# Patient Record
Sex: Male | Born: 1960
Health system: Southern US, Community
[De-identification: ages and names within clinical notes are randomized; demographics above are authoritative.]

## PROBLEM LIST (undated history)

## (undated) DIAGNOSIS — F419 Anxiety disorder, unspecified: Secondary | ICD-10-CM

## (undated) DIAGNOSIS — E7800 Pure hypercholesterolemia, unspecified: Secondary | ICD-10-CM

## (undated) DIAGNOSIS — C801 Malignant (primary) neoplasm, unspecified: Secondary | ICD-10-CM

## (undated) DIAGNOSIS — N200 Calculus of kidney: Secondary | ICD-10-CM

---

## 2015-03-15 ENCOUNTER — Emergency Department (HOSPITAL_COMMUNITY)
Admission: EM | Admit: 2015-03-15 | Discharge: 2015-03-15 | Disposition: A | Discharge status: Home / Self Care | Payer: BLUE CROSS/BLUE SHIELD | Attending: Emergency Medicine | Admitting: Emergency Medicine

## 2015-03-15 ENCOUNTER — Emergency Department (HOSPITAL_COMMUNITY): Payer: BLUE CROSS/BLUE SHIELD

## 2015-03-15 ENCOUNTER — Encounter (HOSPITAL_COMMUNITY): Payer: Self-pay | Admitting: Emergency Medicine

## 2015-03-15 DIAGNOSIS — Z8639 Personal history of other endocrine, nutritional and metabolic disease: Secondary | ICD-10-CM | POA: Diagnosis not present

## 2015-03-15 DIAGNOSIS — R109 Unspecified abdominal pain: Secondary | ICD-10-CM

## 2015-03-15 DIAGNOSIS — R739 Hyperglycemia, unspecified: Secondary | ICD-10-CM | POA: Diagnosis not present

## 2015-03-15 DIAGNOSIS — R911 Solitary pulmonary nodule: Secondary | ICD-10-CM | POA: Diagnosis not present

## 2015-03-15 DIAGNOSIS — N200 Calculus of kidney: Secondary | ICD-10-CM | POA: Diagnosis not present

## 2015-03-15 DIAGNOSIS — K5791 Diverticulosis of intestine, part unspecified, without perforation or abscess with bleeding: Secondary | ICD-10-CM | POA: Insufficient documentation

## 2015-03-15 DIAGNOSIS — R112 Nausea with vomiting, unspecified: Secondary | ICD-10-CM

## 2015-03-15 DIAGNOSIS — K5790 Diverticulosis of intestine, part unspecified, without perforation or abscess without bleeding: Secondary | ICD-10-CM

## 2015-03-15 HISTORY — DX: Pure hypercholesterolemia, unspecified: E78.00

## 2015-03-15 HISTORY — DX: Calculus of kidney: N20.0

## 2015-03-15 LAB — CBC WITH DIFFERENTIAL/PLATELET
BASOS PCT: 1 % (ref 0–1)
Basophils Absolute: 0 10*3/uL (ref 0.0–0.1)
EOS ABS: 0.1 10*3/uL (ref 0.0–0.7)
Eosinophils Relative: 2 % (ref 0–5)
HEMATOCRIT: 41.6 % (ref 39.0–52.0)
Hemoglobin: 13.9 g/dL (ref 13.0–17.0)
LYMPHS ABS: 1.3 10*3/uL (ref 0.7–4.0)
Lymphocytes Relative: 23 % (ref 12–46)
MCH: 28.7 pg (ref 26.0–34.0)
MCHC: 33.4 g/dL (ref 30.0–36.0)
MCV: 85.8 fL (ref 78.0–100.0)
MONO ABS: 0.6 10*3/uL (ref 0.1–1.0)
MONOS PCT: 11 % (ref 3–12)
NEUTROS ABS: 3.6 10*3/uL (ref 1.7–7.7)
Neutrophils Relative %: 63 % (ref 43–77)
Platelets: 223 10*3/uL (ref 150–400)
RBC: 4.85 MIL/uL (ref 4.22–5.81)
RDW: 13 % (ref 11.5–15.5)
WBC: 5.6 10*3/uL (ref 4.0–10.5)

## 2015-03-15 LAB — COMPREHENSIVE METABOLIC PANEL
ALBUMIN: 3.5 g/dL (ref 3.5–5.0)
ALK PHOS: 82 U/L (ref 38–126)
ALT: 39 U/L (ref 17–63)
ANION GAP: 8 (ref 5–15)
AST: 34 U/L (ref 15–41)
BILIRUBIN TOTAL: 0.6 mg/dL (ref 0.3–1.2)
BUN: 7 mg/dL (ref 6–20)
CALCIUM: 9 mg/dL (ref 8.9–10.3)
CO2: 24 mmol/L (ref 22–32)
Chloride: 105 mmol/L (ref 101–111)
Creatinine, Ser: 0.81 mg/dL (ref 0.61–1.24)
GFR calc non Af Amer: >60 mL/min (ref >60)
GLUCOSE: 147 mg/dL — AB (ref 65–99)
POTASSIUM: 4 mmol/L (ref 3.5–5.1)
Sodium: 137 mmol/L (ref 135–145)
TOTAL PROTEIN: 6.6 g/dL (ref 6.5–8.1)

## 2015-03-15 LAB — URINALYSIS, ROUTINE W REFLEX MICROSCOPIC
BILIRUBIN URINE: NEGATIVE
Glucose, UA: NEGATIVE mg/dL
KETONES UR: NEGATIVE mg/dL
Leukocytes, UA: NEGATIVE
NITRITE: NEGATIVE
PROTEIN: NEGATIVE mg/dL
Specific Gravity, Urine: 1.014 (ref 1.005–1.030)
UROBILINOGEN UA: 0.2 mg/dL (ref 0.0–1.0)
pH: 6.5 (ref 5.0–8.0)

## 2015-03-15 LAB — URINE MICROSCOPIC-ADD ON

## 2015-03-15 LAB — LIPASE, BLOOD: LIPASE: 23 U/L (ref 22–51)

## 2015-03-15 MED ORDER — MORPHINE SULFATE (PF) 4 MG/ML IV SOLN
4.0000 mg | Freq: Once | INTRAVENOUS | Status: AC
  Administered 2015-03-15: 4 mg via INTRAVENOUS
  Filled 2015-03-15: qty 1

## 2015-03-15 MED ORDER — TAMSULOSIN HCL 0.4 MG PO CAPS: 0.4000 mg | ORAL_CAPSULE | Freq: Every day | ORAL | Status: DC

## 2015-03-15 MED ORDER — HYDROCODONE-ACETAMINOPHEN 5-325 MG PO TABS: 1.0000 | ORAL_TABLET | Freq: Four times a day (QID) | ORAL | Status: DC | PRN

## 2015-03-15 MED ORDER — ONDANSETRON HCL 4 MG/2ML IJ SOLN
4.0000 mg | Freq: Once | INTRAMUSCULAR | Status: AC
  Administered 2015-03-15: 4 mg via INTRAVENOUS
  Filled 2015-03-15: qty 2

## 2015-03-15 MED ORDER — SODIUM CHLORIDE 0.9 % IV BOLUS (SEPSIS)
1000.0000 mL | Freq: Once | INTRAVENOUS | Status: AC
  Administered 2015-03-15: 1000 mL via INTRAVENOUS

## 2015-03-15 MED ORDER — ONDANSETRON HCL 8 MG PO TABS: 8.0000 mg | ORAL_TABLET | Freq: Three times a day (TID) | ORAL | Status: DC | PRN

## 2015-03-15 MED ORDER — NAPROXEN 500 MG PO TABS: 500.0000 mg | ORAL_TABLET | Freq: Two times a day (BID) | ORAL | Status: DC | PRN

## 2015-03-15 MED ORDER — KETOROLAC TROMETHAMINE 30 MG/ML IJ SOLN
30.0000 mg | Freq: Once | INTRAMUSCULAR | Status: AC
  Administered 2015-03-15: 30 mg via INTRAVENOUS
  Filled 2015-03-15: qty 1

## 2015-03-15 NOTE — ED Notes (Signed)
Pt returned from scans. Monitored by pulse ox and bp cuff. 

## 2015-03-15 NOTE — ED Notes (Signed)
Right sided flank pain starting at 0700 this morning; hx of stones. 2009 was last "bad" episode. Intermittent pain. Is able to void without difficulty.

## 2015-03-15 NOTE — Discharge Instructions (Signed)
Take naprosyn as directed as needed for inflammation and pain using norco for breakthrough pain. Do not drive or operate machinery with pain medication use. May need over-the-counter stool softener with this pain medication use. Use Zofran as needed for nausea. Use Flomax as directed, as this medication will help you pass the stone. Strain all urine until your stone passes. Followup with urologist in the next 1 to 2 weeks for recheck of ongoing pain, however for intractable or uncontrollable pain at home then return to the emergency department.    Also, your CT scan today showed a small pulmonary nodule. You will need to follow up with this result, see your regular doctor who will be able to monitor this as an outpatient. It also showed diverticulosis, eat a high fiber diet. To avoid future kidney stones, eat a low purine diet.   Flank Pain Flank pain is pain in your side. The flank is the area of your side between your upper belly (abdomen) and your back. Pain in this area can be caused by many different things. [place] care and treatment will depend on the cause of your pain.  Rest as told by your doctor.  Drink enough fluids to keep your pee (urine) clear or pale yellow.  Only take medicine as told by your doctor.  Tell your doctor about any changes in your pain.  Follow up with your doctor. GET HELP RIGHT AWAY IF:   Your pain does not get better with medicine.   You have new symptoms or your symptoms get worse.  Your pain gets worse.   You have belly (abdominal) pain.   You are short of breath.   You always feel sick to your stomach (nauseous).   You keep throwing up (vomiting).   You have puffiness (swelling) in your belly.   You feel light-headed or you pass out (faint).   You have blood in your pee.  You have a fever or lasting symptoms for more than 2-3 days.  You have a fever and your symptoms suddenly get worse. MAKE SURE YOU:   Understand  these instructions.  Will watch your condition.  Will get help right away if you are not doing well or get worse. Document Released: 04/02/2008 Document Revised: 11/08/2013 Document Reviewed: 02/06/2012 [facility] Patient Information 2015 [place], [place]. This information is not intended to replace advice given to you by your health care provider. Make sure you discuss any questions you have with your health care provider.  Kidney Stones Kidney stones (urolithiasis) are solid masses that form inside your kidneys. The intense pain is caused by the stone moving through the kidney, ureter, bladder, and urethra (urinary tract). When the stone moves, the ureter starts to spasm around the stone. The stone is usually passed in your pee (urine).  HOME CARE  Drink enough fluids to keep your pee clear or pale yellow. This helps to get the stone out.  Strain all pee through the provided strainer. Do not pee without peeing through the strainer, not even once. If you pee the stone out, catch it in the strainer. The stone may be as small as a grain of salt. Take this to your doctor. This will help your doctor figure out what you can do to try to prevent more kidney stones.  Only take medicine as told by your doctor.  Follow up with your doctor as told.  Get follow-up X-rays as told by your doctor. GET HELP IF: You have pain that  gets worse even if you have been taking pain medicine. GET HELP RIGHT AWAY IF:   Your pain does not get better with medicine.  You have a fever or shaking chills.  Your pain increases and gets worse over 18 hours.  You have new belly (abdominal) pain.  You feel faint or pass out.  You are unable to pee. MAKE SURE YOU:   Understand these instructions.  Will watch your condition.  Will get help right away if you are not doing well or get worse. Document Released: 12/11/2007 Document Revised: 02/24/2013 Document Reviewed: 11/25/2012 [facility] Patient Information  2015 [place], [place]. This information is not intended to replace advice given to you by your health care provider. Make sure you discuss any questions you have with your health care provider.  Low-Purine Diet Purines are compounds that affect the level of uric acid in your body. A low-purine diet is a diet that is low in purines. Eating a low-purine diet can prevent the level of uric acid in your body from getting too high and causing gout or kidney stones or both. WHAT DO I NEED TO KNOW ABOUT THIS DIET?  Choose low-purine foods. Examples of low-purine foods are listed in the next section.  Drink plenty of fluids, especially water. Fluids can help remove uric acid from your body. Try to drink 8-16 cups (1.9-3.8 L) a day.  Limit foods high in fat, especially saturated fat, as fat makes it harder for the body to get rid of uric acid. Foods high in saturated fat include pizza, cheese, ice cream, whole milk, fried foods, and gravies. Choose foods that are lower in fat and lean sources of protein. Use olive oil when cooking as it contains healthy fats that are not high in saturated fat.  Limit alcohol. Alcohol interferes with the elimination of uric acid from your body. If you are having a gout attack, avoid all alcohol.  Keep in mind that different people's bodies react differently to different foods. You will probably learn over time which foods do or do not affect you. If you discover that a food tends to cause your gout to flare up, avoid eating that food. You can more freely enjoy foods that do not cause problems. If you have any questions about a food item, talk to your dietitian or health care provider. WHICH FOODS ARE LOW, MODERATE, AND HIGH IN PURINES? The following is a list of foods that are low, moderate, and high in purines. You can eat any amount of the foods that are low in purines. You may be able to have small amounts of foods that are moderate in purines. Ask your health care provider  how much of a food moderate in purines you can have. Avoid foods high in purines. Grains  Foods low in purines: Enriched white bread, pasta, rice, cake, cornbread, popcorn.  Foods moderate in purines: Whole-grain breads and cereals, wheat germ, bran, oatmeal. Uncooked oatmeal. Dry wheat bran or wheat germ.  Foods high in purines: Pancakes, [place] toast, biscuits, muffins. Vegetables  Foods low in purines: All vegetables, except those that are moderate in purines.  Foods moderate in purines: Asparagus, cauliflower, spinach, mushrooms, green peas. Fruits  All fruits are low in purines. Meats and other Protein Foods  Foods low in purines: Eggs, nuts, peanut butter.  Foods moderate in purines: 80-90% lean beef, lamb, veal, pork, poultry, fish, eggs, peanut butter, nuts. Crab, lobster, oysters, and shrimp. Cooked dried beans, peas, and lentils.  Foods high  in purines: Anchovies, sardines, herring, mussels, tuna, codfish, scallops, trout, and haddock. Berniece Salines. Organ meats (such as liver or kidney). Tripe. Game meat. Goose. Sweetbreads. Dairy  All dairy foods are low in purines. Low-fat and fat-free dairy products are best because they are low in saturated fat. Beverages  Drinks low in purines: Water, carbonated beverages, tea, coffee, cocoa.  Drinks moderate in purines: Soft drinks and other drinks sweetened with high-fructose corn syrup. Juices. To find whether a food or drink is sweetened with high-fructose corn syrup, look at the ingredients list.  Drinks high in purines: Alcoholic beverages (such as beer). Condiments  Foods low in purines: Salt, herbs, olives, pickles, relishes, vinegar.  Foods moderate in purines: Butter, margarine, oils, mayonnaise. Fats and Oils  Foods low in purines: All types, except gravies and sauces made with meat.  Foods high in purines: Gravies and sauces made with meat. Other Foods  Foods low in purines: Sugars, sweets, gelatin. Cake. Soups made  without meat.  Foods moderate in purines: Meat-based or fish-based soups, broths, or bouillons. Foods and drinks sweetened with high-fructose corn syrup.  Foods high in purines: High-fat desserts (such as ice cream, cookies, cakes, pies, doughnuts, and chocolate). Contact your dietitian for more information on foods that are not listed here. Document Released: 10/19/2010 Document Revised: 06/29/2013 Document Reviewed: 05/31/2013 [facility] Patient Information 2015 [place], [place]. This information is not intended to replace advice given to you by your health care provider. Make sure you discuss any questions you have with your health care provider.  Nausea and Vomiting Nausea means you feel sick to your stomach. Throwing up (vomiting) is a reflex where stomach contents come out of your mouth. HOME CARE   Take medicine as told by your doctor.  Do not force yourself to eat. However, you do need to drink fluids.  If you feel like eating, eat a normal diet as told by your doctor.  Eat rice, wheat, potatoes, bread, lean meats, yogurt, fruits, and vegetables.  Avoid high-fat foods.  Drink enough fluids to keep your pee (urine) clear or pale yellow.  Ask your doctor how to replace body fluid losses (rehydrate). Signs of body fluid loss (dehydration) include:  Feeling very thirsty.  Dry lips and mouth.  Feeling dizzy.  Dark pee.  Peeing less than normal.  Feeling confused.  Fast breathing or heart rate. GET HELP RIGHT AWAY IF:   You have blood in your throw up.  You have black or bloody poop (stool).  You have a bad headache or stiff neck.  You feel confused.  You have bad belly (abdominal) pain.  You have chest pain or trouble breathing.  You do not pee at least once every 8 hours.  You have cold, clammy skin.  You keep throwing up after 24 to 48 hours.  You have a fever. MAKE SURE YOU:   Understand these instructions.  Will watch your condition.  Will get  help right away if you are not doing well or get worse. Document Released: 12/11/2007 Document Revised: 09/16/2011 Document Reviewed: 11/23/2010 [facility] Patient Information 2015 [place], [place]. This information is not intended to replace advice given to you by your health care provider. Make sure you discuss any questions you have with your health care provider.  Pulmonary Nodule A pulmonary nodule is a small, round growth of tissue in the lung. Pulmonary nodules can range in size from less than 1/5 inch (4 mm) to a little bigger than an inch (25 mm). Most pulmonary  nodules are detected when imaging tests of the lung are being performed for a different problem. Pulmonary nodules are usually not cancerous (benign). However, some pulmonary nodules are cancerous (malignant). Follow-up treatment or testing is based on the size of the pulmonary nodule and your risk of getting lung cancer.  CAUSES Benign pulmonary nodules can be caused by various things. Some of the causes include:   Bacterial, fungal, or viral infections. This is usually an old infection that is no longer active, but it can sometimes be a current, active infection.  A benign mass of tissue.  Inflammation from conditions such as rheumatoid arthritis.   Abnormal blood vessels in the lungs. Malignant pulmonary nodules can result from lung cancer or from cancers that spread to the lung from other places in the body. SIGNS AND SYMPTOMS Pulmonary nodules usually do not cause symptoms. DIAGNOSIS Most often, pulmonary nodules are found incidentally when an X-ray or CT scan is performed to look for some other problem in the lung area. To help determine whether a pulmonary nodule is benign or malignant, your health care provider will take a medical history and order a variety of tests. Tests done may include:   Blood tests.  A skin test called a tuberculin test. This test is used to determine if you have been exposed to the germ that  causes tuberculosis.   Chest X-rays. If possible, a new X-ray may be compared with X-rays you have had in the past.   CT scan. This test shows smaller pulmonary nodules more clearly than an X-ray.   Positron emission tomography (PET) scan. In this test, a safe amount of a radioactive substance is injected into the bloodstream. Then, the scan takes a picture of the pulmonary nodule. The radioactive substance is eliminated from your body in your urine.   Biopsy. A tiny piece of the pulmonary nodule is removed so it can be checked under a microscope. TREATMENT  Pulmonary nodules that are benign normally do not require any treatment because they usually do not cause symptoms or breathing problems. Your health care provider may want to monitor the pulmonary nodule through follow-up CT scans. The frequency of these CT scans will vary based on the size of the nodule and the risk factors for lung cancer. For example, CT scans will need to be done more frequently if the pulmonary nodule is larger and if you have a history of smoking and a family history of cancer. Further testing or biopsies may be done if any follow-up CT scan shows that the size of the pulmonary nodule has increased. HOME CARE INSTRUCTIONS  Only take over-the-counter or prescription medicines as directed by your health care provider.  Keep all follow-up appointments with your health care provider. SEEK MEDICAL CARE IF:  You have trouble breathing when you are active.   You feel sick or unusually tired.   You do not feel like eating.   You lose weight without trying to.   You develop chills or night sweats.  SEEK IMMEDIATE MEDICAL CARE IF:  You cannot catch your breath, or you begin wheezing.   You cannot stop coughing.   You cough up blood.   You become dizzy or feel like you are going to pass out.   You have sudden chest pain.   You have a fever or persistent symptoms for more than 2-3 days.   You  have a fever and your symptoms suddenly get worse. MAKE SURE YOU:  Understand these instructions.  Will watch your condition.  Will get help right away if you are not doing well or get worse. Document Released: 04/21/2009 Document Revised: 02/24/2013 Document Reviewed: 12/14/2012 [facility] Patient Information 2015 [place], [place]. This information is not intended to replace advice given to you by your health care provider. Make sure you discuss any questions you have with your health care provider.  Diverticulosis Diverticulosis is the condition that develops when small pouches (diverticula) form in the wall of your colon. Your colon, or large intestine, is where water is absorbed and stool is formed. The pouches form when the inside layer of your colon pushes through weak spots in the outer layers of your colon. CAUSES  No one knows exactly what causes diverticulosis. RISK FACTORS  Being older than 33. Your risk for this condition increases with age. Diverticulosis is rare in people younger than 40 years. By age 43, almost everyone has it.  Eating a low-fiber diet.  Being frequently constipated.  Being overweight.  Not getting enough exercise.  Smoking.  Taking over-the-counter pain medicines, like aspirin and ibuprofen. SYMPTOMS  Most people with diverticulosis do not have symptoms. DIAGNOSIS  Because diverticulosis often has no symptoms, health care providers often discover the condition during an exam for other colon problems. In many cases, a health care provider will diagnose diverticulosis while using a flexible scope to examine the colon (colonoscopy). TREATMENT  If you have never developed an infection related to diverticulosis, you may not need treatment. If you have had an infection before, treatment may include:  Eating more fruits, vegetables, and grains.  Taking a fiber supplement.  Taking a live bacteria supplement (probiotic).  Taking medicine to relax your  colon. HOME CARE INSTRUCTIONS   Drink at least 6-8 glasses of water each day to prevent constipation.  Try not to strain when you have a bowel movement.  Keep all follow-up appointments. If you have had an infection before:  Increase the fiber in your diet as directed by your health care provider or dietitian.  Take a dietary fiber supplement if your health care provider approves.  Only take medicines as directed by your health care provider. SEEK MEDICAL CARE IF:   You have abdominal pain.  You have bloating.  You have cramps.  You have not gone to the bathroom in 3 days. SEEK IMMEDIATE MEDICAL CARE IF:   Your pain gets worse.  Yourbloating becomes very bad.  You have a fever or chills, and your symptoms suddenly get worse.  You begin vomiting.  You have bowel movements that are bloody or black. MAKE SURE YOU:  Understand these instructions.  Will watch your condition.  Will get help right away if you are not doing well or get worse. Document Released: 03/21/2004 Document Revised: 06/29/2013 Document Reviewed: 05/19/2013 [facility] Patient Information 2015 [place], [place]. This information is not intended to replace advice given to you by your health care provider. Make sure you discuss any questions you have with your health care provider.

## 2015-03-15 NOTE — ED Notes (Signed)
Patient transported to CT without distress 

## 2015-03-15 NOTE — ED Notes (Signed)
Pt requesting to not get pain meds at this time.

## 2015-03-15 NOTE — ED Notes (Signed)
Pt placed in gown and in bed. Pt monitored by pulse ox and bp cuff. 

## 2015-03-15 NOTE — ED Notes (Signed)
PA at bedside.

## 2015-03-15 NOTE — ED Provider Notes (Signed)
CSN: [id number]     Arrival date & time 03/15/15  0836 History   First MD Initiated Contact with Patient 03/15/15 [phone]     Chief Complaint  Patient presents with  . Flank Pain     (Consider location/radiation/quality/duration/timing/severity/associated sxs/prior Treatment) HPI Comments: Bill Wright is a 54 y.o. male with a PMHx of nephrolithiasis and HLD, who presents to the ED with complaints of right flank pain that began around 7:30 AM. He states is very similar to prior kidney stones. He describes the pain is 7/10 constant waxing and waning right flank pain which is sharp and nonradiating, with no known aggravating factors, and minimally relieved with 4 Advil prior to arrival. Associated symptoms include nausea and 1 episode of nonbloody nonbilious emesis. He denies any fevers, chills, chest pain, shortness breath, abdominal pain, diarrhea, constipation, hematemesis, hematochezia, melena, obstipation, dysuria, hematuria, urinary frequency or urgency, testicular pain or swelling, penile discharge, numbness, tingling, or weakness. His prior kidney stones have passed on their own without surgical intervention.   Patient is a 54 y.o. male presenting with flank pain. The history is provided by the patient. No language interpreter was used.  Flank Pain This is a recurrent problem. The current episode started today. The problem occurs constantly. The problem has been waxing and waning. Associated symptoms include nausea and vomiting. Pertinent negatives include no abdominal pain, arthralgias, chest pain, chills, fever, myalgias, numbness, urinary symptoms or weakness. Nothing aggravates the symptoms. He has tried NSAIDs for the symptoms. The treatment provided mild relief.    Past Medical History  Diagnosis Date  . Kidney calculi   . Hypercholesteremia    No past surgical history on file. No family history on file. Social History  Substance Use Topics  . Smoking status: Never Smoker   .  Smokeless tobacco: Not on file  . Alcohol Use: Not on file    Review of Systems  Constitutional: Negative for fever and chills.  Respiratory: Negative for shortness of breath.   Cardiovascular: Negative for chest pain.  Gastrointestinal: Positive for nausea and vomiting. Negative for abdominal pain, diarrhea, constipation and blood in stool.  Genitourinary: Positive for flank pain. Negative for dysuria, frequency, hematuria, discharge, scrotal swelling and testicular pain.  Musculoskeletal: Negative for myalgias and arthralgias.  Skin: Negative for color change.  Allergic/Immunologic: Negative for immunocompromised state.  Neurological: Negative for weakness and numbness.  Psychiatric/Behavioral: Negative for confusion.   10 Systems reviewed and are negative for acute change except as noted in the HPI.    Allergies  Review of patient's allergies indicates no known allergies.  Home Medications   Prior to Admission medications   Not on File   BP 151/88 mmHg  Pulse 90  Temp(Src) 97.7 F (36.5 C) (Oral)  Resp 16  SpO2 100% Physical Exam  Constitutional: He is oriented to person, place, and time. Vital signs are normal. He appears well-developed and well-nourished.  Non-toxic appearance. No distress.  Afebrile, nontoxic, NAD  HENT:  Head: Normocephalic and atraumatic.  Mouth/Throat: Oropharynx is clear and moist and mucous membranes are normal.  Eyes: Conjunctivae and EOM are normal. Right eye exhibits no discharge. Left eye exhibits no discharge.  Neck: Normal range of motion. Neck supple.  Cardiovascular: Normal rate, regular rhythm, normal heart sounds and intact distal pulses.  Exam reveals no gallop and no friction rub.   No murmur heard. Pulmonary/Chest: Effort normal and breath sounds normal. No respiratory distress. He has no decreased breath sounds. He has no wheezes. He  has no rhonchi. He has no rales.  Abdominal: Soft. Normal appearance and bowel sounds are normal.  He exhibits no distension. There is tenderness in the right lower quadrant. There is no rigidity, no rebound, no guarding, no CVA tenderness, no tenderness at McBurney's point and negative Murphy's sign.    Soft, nondistended, +BS throughout, with mild R lateral abd TTP, no r/g/r, neg murphy's, neg mcburney's, no CVA TTP   Musculoskeletal: Normal range of motion.  Neurological: He is alert and oriented to person, place, and time. He has normal strength. No sensory deficit.  Skin: Skin is warm, dry and intact. No rash noted.  Psychiatric: He has a normal mood and affect.  Nursing note and vitals reviewed.   ED Course  Procedures (including critical care time) Labs Review Labs Reviewed  COMPREHENSIVE METABOLIC PANEL - Abnormal; Notable for the following:    Glucose, Bld 147 (*)    All other components within normal limits  URINALYSIS, ROUTINE W REFLEX MICROSCOPIC (NOT AT [facility]) - Abnormal; Notable for the following:    Hgb urine dipstick LARGE (*)    All other components within normal limits  URINE MICROSCOPIC-ADD ON - Abnormal; Notable for the following:    Squamous Epithelial / LPF FEW (*)    All other components within normal limits  URINE CULTURE  CBC WITH DIFFERENTIAL/PLATELET  LIPASE, BLOOD    Imaging Review Ct Renal Stone Study  03/15/2015   CLINICAL DATA:  Right flank pain  EXAM: CT ABDOMEN AND PELVIS WITHOUT CONTRAST  TECHNIQUE: Multidetector CT imaging of the abdomen and pelvis was performed following the standard protocol without IV contrast.  COMPARISON:  None.  FINDINGS: Lower chest: 11 mm noncalcified nodule right lung base. No other lung nodules in the bases. No infiltrate or effusion. Heart size normal.  Hepatobiliary: Fatty infiltration of liver with diffuse low-density liver. No liver mass. Gallbladder and bile ducts normal.  Pancreas: Negative  Spleen: Negative  Adrenals/Urinary Tract: Mild obstruction of the right kidney collecting system due to a small 3 mm stone in  the proximal right ureter. No obstruction of the left kidney. No other renal calculi. No mass lesion on unenhanced imaging. Urinary bladder normal.  Stomach/Bowel: Negative for bowel obstruction. Sigmoid diverticulosis. Thickening of the sigmoid colon likely represents chronic diverticular change however neoplasm cannot be excluded.  Vascular/Lymphatic: Negative  Reproductive: Prostate gland and seminal vesicles normal bilaterally  Other: No free fluid.  Negative for adenopathy.  Musculoskeletal: Negative  IMPRESSION: 3 mm stone proximal right ureter causing partial obstruction of the right kidney  11 mm right lower lobe nodule. Differential includes benign and malignant nodules including metastatic disease. CT chest with contrast recommended for further evaluation. Consider performing contrast-enhanced CT abdomen pelvis at the time of the chest CT to evaluate for malignancy.  Sigmoid diverticulosis. Mucosal edema in the sigmoid colon could represent colonic neoplasm versus chronic diverticular change. This could be evaluated at CT abdomen pelvis with contrast. Endoscopy also recommended to exclude tumor.   Electronically Signed   By: Franchot Gallo M.D.   On: 03/15/2015 10:34   I have personally reviewed and evaluated these images and lab results as part of my medical decision-making.   EKG Interpretation None      MDM   Final diagnoses:  Right flank pain  Nephrolithiasis  Pulmonary nodule  Diverticulosis of intestine without bleeding, unspecified intestinal tract location  Non-intractable vomiting with nausea, vomiting of unspecified type  Hyperglycemia    54 y.o. male here with  R flank pain since 7:30am. Feels like prior stones. Mild R lateral abd tenderness, no CVA TTP. Will get labs, U/A, UCx, and CT renal study. Will give fluids, pain meds, and nausea meds. Will reassess shortly.   11:17 AM Labs unremarkable aside from mild hyperglycemia at 147. CT renal showing 43mm R proximal ureter  stone, also shows 42mm R lung base nodule, pt nonsmoker and low risk, will have him f/up with PCP for this. Also shows diverticulosis with some mucosal edema but abd nontender and no diarrhea, doubt diverticulitis. Pain improved, nausea resolved. Will await U/A to ensure no UTI which would complicate stone. Will reassess shortly.   1:15 PM U/A finally resulting, no signs of infection, just hematuria as expected. Pt now with return of pain. Will give toradol and morphine, then d/c home with vicodin, flomax, naprosyn, and urology f/up.   2:00 PM Pain improved. Tolerating PO. Will have him f/up in 1-2wks with urology, and with his PCP for the other CT findings. Rx written as previously discussed. Advised good hydration and low purine diet as well as high fiber diet. I explained the diagnosis and have given explicit precautions to return to the ER including for any other new or worsening symptoms. The patient understands and accepts the medical plan as it's been dictated and I have answered their questions. Discharge instructions concerning home care and prescriptions have been given. The patient is STABLE and is discharged to home in good condition.  BP 124/71 mmHg  Pulse 75  Temp(Src) 97.7 F (36.5 C) (Oral)  Resp 16  SpO2 99%  Meds ordered this encounter  Medications  . sodium chloride 0.9 % bolus 1,000 mL    Sig:   . morphine 4 MG/ML injection 4 mg    Sig:   . ondansetron (ZOFRAN) injection 4 mg    Sig:   . ibuprofen (ADVIL,MOTRIN) 200 MG tablet    Sig: Take 800 mg by mouth every 6 (six) hours as needed for moderate pain.  Marland Kitchen ketorolac (TORADOL) 30 MG/ML injection 30 mg    Sig:   . morphine 4 MG/ML injection 4 mg    Sig:   . naproxen (NAPROSYN) 500 MG tablet    Sig: Take 1 tablet (500 mg total) by mouth 2 (two) times daily as needed for mild pain, moderate pain or headache (TAKE WITH MEALS.).    Dispense:  20 tablet    Refill:  0    Order Specific Question:  Supervising Provider     Answer:  MILLER, BRIAN [[id number]]  . HYDROcodone-acetaminophen (NORCO) 5-325 MG per tablet    Sig: Take 1 tablet by mouth every 6 (six) hours as needed for severe pain.    Dispense:  15 tablet    Refill:  0    Order Specific Question:  Supervising Provider    Answer:  MILLER, BRIAN [[id number]]  . tamsulosin (FLOMAX) 0.4 MG CAPS capsule    Sig: Take 1 capsule (0.4 mg total) by mouth daily after supper. Take until your stone passes    Dispense:  15 capsule    Refill:  0    Order Specific Question:  Supervising Provider    Answer:  MILLER, BRIAN [[id number]]  . ondansetron (ZOFRAN) 8 MG tablet    Sig: Take 1 tablet (8 mg total) by mouth every 8 (eight) hours as needed for nausea or vomiting.    Dispense:  10 tablet    Refill:  0    Order Specific Question:  Supervising  Provider    Answer:  Noemi Chapel [address] Camprubi-Soms, PA-C 03/15/15 [place], MD 03/15/15 [phone]

## 2015-03-15 NOTE — ED Notes (Signed)
PT informed of need of urine sample. Pt states that he is unable to provide a sample at this moment.

## 2015-03-16 ENCOUNTER — Emergency Department (HOSPITAL_COMMUNITY)
Admission: EM | Admit: 2015-03-16 | Discharge: 2015-03-16 | Disposition: A | Discharge status: Home / Self Care | Payer: BLUE CROSS/BLUE SHIELD | Attending: Emergency Medicine | Admitting: Emergency Medicine

## 2015-03-16 ENCOUNTER — Encounter (HOSPITAL_COMMUNITY): Payer: Self-pay | Admitting: *Deleted

## 2015-03-16 DIAGNOSIS — Z8639 Personal history of other endocrine, nutritional and metabolic disease: Secondary | ICD-10-CM | POA: Insufficient documentation

## 2015-03-16 DIAGNOSIS — Z87442 Personal history of urinary calculi: Secondary | ICD-10-CM | POA: Diagnosis not present

## 2015-03-16 DIAGNOSIS — Z79899 Other long term (current) drug therapy: Secondary | ICD-10-CM | POA: Diagnosis not present

## 2015-03-16 DIAGNOSIS — N201 Calculus of ureter: Secondary | ICD-10-CM | POA: Insufficient documentation

## 2015-03-16 DIAGNOSIS — R109 Unspecified abdominal pain: Secondary | ICD-10-CM | POA: Diagnosis present

## 2015-03-16 LAB — COMPREHENSIVE METABOLIC PANEL
ALBUMIN: 3.7 g/dL (ref 3.5–5.0)
ALT: 45 U/L (ref 17–63)
ANION GAP: 9 (ref 5–15)
AST: 35 U/L (ref 15–41)
Alkaline Phosphatase: 81 U/L (ref 38–126)
BILIRUBIN TOTAL: 0.6 mg/dL (ref 0.3–1.2)
BUN: 14 mg/dL (ref 6–20)
CO2: 26 mmol/L (ref 22–32)
Calcium: 9.4 mg/dL (ref 8.9–10.3)
Chloride: 103 mmol/L (ref 101–111)
Creatinine, Ser: 1.04 mg/dL (ref 0.61–1.24)
GFR calc Af Amer: >60 mL/min (ref >60)
GFR calc non Af Amer: >60 mL/min (ref >60)
GLUCOSE: 149 mg/dL — AB (ref 65–99)
POTASSIUM: 4 mmol/L (ref 3.5–5.1)
SODIUM: 138 mmol/L (ref 135–145)
TOTAL PROTEIN: 7.2 g/dL (ref 6.5–8.1)

## 2015-03-16 LAB — URINALYSIS, ROUTINE W REFLEX MICROSCOPIC
Bilirubin Urine: NEGATIVE
Glucose, UA: NEGATIVE mg/dL
Ketones, ur: NEGATIVE mg/dL
Leukocytes, UA: NEGATIVE
NITRITE: NEGATIVE
Protein, ur: NEGATIVE mg/dL
SPECIFIC GRAVITY, URINE: 1.012 (ref 1.005–1.030)
UROBILINOGEN UA: 0.2 mg/dL (ref 0.0–1.0)
pH: 6 (ref 5.0–8.0)

## 2015-03-16 LAB — CBC
HEMATOCRIT: 40.5 % (ref 39.0–52.0)
HEMOGLOBIN: 13.8 g/dL (ref 13.0–17.0)
MCH: 28.5 pg (ref 26.0–34.0)
MCHC: 34.1 g/dL (ref 30.0–36.0)
MCV: 83.7 fL (ref 78.0–100.0)
Platelets: 246 10*3/uL (ref 150–400)
RBC: 4.84 MIL/uL (ref 4.22–5.81)
RDW: 12.7 % (ref 11.5–15.5)
WBC: 11.3 10*3/uL — ABNORMAL HIGH (ref 4.0–10.5)

## 2015-03-16 LAB — URINE CULTURE

## 2015-03-16 LAB — URINE MICROSCOPIC-ADD ON

## 2015-03-16 LAB — LIPASE, BLOOD: LIPASE: 18 U/L — AB (ref 22–51)

## 2015-03-16 MED ORDER — KETOROLAC TROMETHAMINE 30 MG/ML IJ SOLN
60.0000 mg | Freq: Once | INTRAMUSCULAR | Status: AC
Start: 2015-03-16 — End: 2015-03-16
  Administered 2015-03-16: 60 mg via INTRAMUSCULAR
  Filled 2015-03-16: qty 2

## 2015-03-16 NOTE — Discharge Instructions (Signed)

## 2015-03-16 NOTE — ED Notes (Signed)
Pt arrives via EMS from home c/o abd pain. States that he was here yesterday and diagnosed with kidney stones. States that today he vomited and is now having abd pain.

## 2015-03-16 NOTE — ED Provider Notes (Signed)
CSN: [id number]     Arrival date & time 03/16/15  1638 History   First MD Initiated Contact with Patient 03/16/15 1754     Chief Complaint  Patient presents with  . Abdominal Pain  . Nephrolithiasis     (Consider location/radiation/quality/duration/timing/severity/associated sxs/prior Treatment) HPI Comments: 54 year old male presents to the emergency department for further evaluation of abdominal pain. Patient was seen yesterday and diagnosed with a right-sided kidney stone. He reports that his pain was well controlled with PRN Vicodin until it acutely worsened at 1400 today. Patient left his medication at home while picking up his daughter up from school. He was able to take a tablet at 1500 and again at 1520 when his first tablet provided no relief. Patient reports no improvement in his symptoms with Vicodin after this time. After 2 hours of constant worsening pain, patient decided to come back to the ED for pain control. He reports sudden relief of his symptoms at 1700. Symptoms associated with nausea as well as 4 episodes of emesis prior to arrival. Patient now has the urge to urinate. He has not urinated since his pain worsened. Patient denies her being seen by a urologist. He is scheduled for follow-up soon. Patient denies associated fever, hematemesis, or dysuria. He has no complaints of pain at this time.  Patient is a 54 y.o. male presenting with abdominal pain. The history is provided by the patient. No language interpreter was used.  Abdominal Pain Associated symptoms: nausea and vomiting   Associated symptoms: no fever     Past Medical History  Diagnosis Date  . Kidney calculi   . Hypercholesteremia    History reviewed. No pertinent past surgical history. No family history on file. Social History  Substance Use Topics  . Smoking status: Never Smoker   . Smokeless tobacco: None  . Alcohol Use: No    Review of Systems  Constitutional: Negative for fever.   Gastrointestinal: Positive for nausea, vomiting and abdominal pain.  Genitourinary: Positive for flank pain.  All other systems reviewed and are negative.   Allergies  Review of patient's allergies indicates no known allergies.  Home Medications   Prior to Admission medications   Medication Sig Start Date End Date Taking? Authorizing Provider  HYDROcodone-acetaminophen (NORCO) 5-325 MG per tablet Take 1 tablet by mouth every 6 (six) hours as needed for severe pain. 03/15/15   Mercedes Camprubi-Soms, PA-C  ibuprofen (ADVIL,MOTRIN) 200 MG tablet Take 800 mg by mouth every 6 (six) hours as needed for moderate pain.    Historical Provider, MD  naproxen (NAPROSYN) 500 MG tablet Take 1 tablet (500 mg total) by mouth 2 (two) times daily as needed for mild pain, moderate pain or headache (TAKE WITH MEALS.). 03/15/15   Mercedes Camprubi-Soms, PA-C  ondansetron (ZOFRAN) 8 MG tablet Take 1 tablet (8 mg total) by mouth every 8 (eight) hours as needed for nausea or vomiting. 03/15/15   Mercedes Camprubi-Soms, PA-C  tamsulosin (FLOMAX) 0.4 MG CAPS capsule Take 1 capsule (0.4 mg total) by mouth daily after supper. Take until your stone passes 03/15/15   Mercedes Camprubi-Soms, PA-C   BP 159/92 mmHg  Pulse 98  Temp(Src) 97.8 F (36.6 C) (Oral)  Resp 16  Ht 5\' 6"  (1.676 m)  Wt 220 lb (99.791 kg)  BMI 35.53 kg/m2  SpO2 99%   Physical Exam  Constitutional: He is oriented to person, place, and time. He appears well-developed and well-nourished. No distress.  Nontoxic/nonseptic appearing  HENT:  Head: Normocephalic and  atraumatic.  Eyes: Conjunctivae and EOM are normal. No scleral icterus.  Neck: Normal range of motion.  Cardiovascular: Normal rate, regular rhythm and intact distal pulses.   Pulmonary/Chest: Effort normal. No respiratory distress. He has no wheezes.  Respirations even and unlabored  Abdominal: Soft. He exhibits no distension. There is no tenderness. There is no rebound and no guarding.   Soft, obese abdomen. No focal tenderness appreciated. No masses or peritoneal signs.  Musculoskeletal: Normal range of motion.  Neurological: He is alert and oriented to person, place, and time. He exhibits normal muscle tone. Coordination normal.  Skin: Skin is warm and dry. No rash noted. He is not diaphoretic. No erythema. No pallor.  Psychiatric: He has a normal mood and affect. His behavior is normal.  Nursing note and vitals reviewed.   ED Course  Procedures (including critical care time) Labs Review Labs Reviewed  LIPASE, BLOOD - Abnormal; Notable for the following:    Lipase 18 (*)    All other components within normal limits  COMPREHENSIVE METABOLIC PANEL - Abnormal; Notable for the following:    Glucose, Bld 149 (*)    All other components within normal limits  CBC - Abnormal; Notable for the following:    WBC 11.3 (*)    All other components within normal limits  URINALYSIS, ROUTINE W REFLEX MICROSCOPIC (NOT AT [facility]) - Abnormal; Notable for the following:    Hgb urine dipstick MODERATE (*)    All other components within normal limits  URINE MICROSCOPIC-ADD ON    Imaging Review Ct Renal Stone Study  03/15/2015   CLINICAL DATA:  Right flank pain  EXAM: CT ABDOMEN AND PELVIS WITHOUT CONTRAST  TECHNIQUE: Multidetector CT imaging of the abdomen and pelvis was performed following the standard protocol without IV contrast.  COMPARISON:  None.  FINDINGS: Lower chest: 11 mm noncalcified nodule right lung base. No other lung nodules in the bases. No infiltrate or effusion. Heart size normal.  Hepatobiliary: Fatty infiltration of liver with diffuse low-density liver. No liver mass. Gallbladder and bile ducts normal.  Pancreas: Negative  Spleen: Negative  Adrenals/Urinary Tract: Mild obstruction of the right kidney collecting system due to a small 3 mm stone in the proximal right ureter. No obstruction of the left kidney. No other renal calculi. No mass lesion on unenhanced imaging.  Urinary bladder normal.  Stomach/Bowel: Negative for bowel obstruction. Sigmoid diverticulosis. Thickening of the sigmoid colon likely represents chronic diverticular change however neoplasm cannot be excluded.  Vascular/Lymphatic: Negative  Reproductive: Prostate gland and seminal vesicles normal bilaterally  Other: No free fluid.  Negative for adenopathy.  Musculoskeletal: Negative  IMPRESSION: 3 mm stone proximal right ureter causing partial obstruction of the right kidney  11 mm right lower lobe nodule. Differential includes benign and malignant nodules including metastatic disease. CT chest with contrast recommended for further evaluation. Consider performing contrast-enhanced CT abdomen pelvis at the time of the chest CT to evaluate for malignancy.  Sigmoid diverticulosis. Mucosal edema in the sigmoid colon could represent colonic neoplasm versus chronic diverticular change. This could be evaluated at CT abdomen pelvis with contrast. Endoscopy also recommended to exclude tumor.   Electronically Signed   By: Franchot Gallo M.D.   On: 03/15/2015 10:34   I have personally reviewed and evaluated these images and lab results as part of my medical decision-making.   EKG Interpretation None      MDM   Final diagnoses:  Acute right flank pain  Ureterolithiasis    54 year old  male presents to the emergency department for complaints of acute right flank pain. Patient seen for same yesterday and diagnosed with a 3 mm right kidney stone. Patient has no evidence of urinary tract infection today. Kidney function is preserved. Patient has been pain-free since 1700. He has had no nausea or vomiting in the emergency department. No reproducible tenderness on palpation.  Have discussed with the patient that he may have passed his kidney stone into his bladder or the stone may have stopped moving causing temporary cessation of his pain. I discussed outpatient pain control with the patient; he has already been  prescribed Flomax and Norco. He states that he has follow-up with his primary care doctor tomorrow. No indication for further emergent workup at this time. Patient discharged in good condition; VSS.   Filed Vitals:   03/16/15 1658 03/16/15 1730 03/16/15 1802 03/16/15 1943  BP: 117/80 157/73 157/73 159/92  Pulse: 92 99 103 98  Temp: 97.7 F (36.5 C)   97.8 F (36.6 C)  TempSrc: Oral   Oral  Resp: 20  18 16   Height: 5\' 6"  (1.676 m)     Weight: 220 lb (99.791 kg)     SpO2: 96% 99% 99% 99%       Antonietta Breach, PA-C 03/16/15 2009  Orlie Dakin, MD 03/17/15 [phone]

## 2015-03-16 NOTE — ED Notes (Signed)
Pt verbalized understanding of follow up with urologist. Pt stable and NAD and is pain free upon d/c.

## 2015-03-28 ENCOUNTER — Other Ambulatory Visit: Payer: Self-pay | Admitting: Family Medicine

## 2015-03-28 DIAGNOSIS — K921 Melena: Secondary | ICD-10-CM

## 2015-03-28 DIAGNOSIS — R9389 Abnormal findings on diagnostic imaging of other specified body structures: Secondary | ICD-10-CM

## 2015-03-28 DIAGNOSIS — R918 Other nonspecific abnormal finding of lung field: Secondary | ICD-10-CM

## 2015-03-30 ENCOUNTER — Other Ambulatory Visit: Payer: Self-pay | Admitting: Gastroenterology

## 2015-03-30 HISTORY — PX: COLONOSCOPY: SHX174

## 2015-04-03 ENCOUNTER — Ambulatory Visit
Admission: RE | Admit: 2015-04-03 | Discharge: 2015-04-03 | Disposition: A | Discharge status: Home / Self Care | Payer: BLUE CROSS/BLUE SHIELD | Source: Ambulatory Visit | Attending: Family Medicine | Admitting: Family Medicine

## 2015-04-03 DIAGNOSIS — R9389 Abnormal findings on diagnostic imaging of other specified body structures: Secondary | ICD-10-CM

## 2015-04-03 DIAGNOSIS — K921 Melena: Secondary | ICD-10-CM

## 2015-04-03 DIAGNOSIS — R918 Other nonspecific abnormal finding of lung field: Secondary | ICD-10-CM

## 2015-04-03 MED ORDER — IOPAMIDOL (ISOVUE-300) INJECTION 61%
125.0000 mL | Freq: Once | INTRAVENOUS | Status: AC | PRN
  Administered 2015-04-03: 125 mL via INTRAVENOUS

## 2015-04-05 ENCOUNTER — Telehealth: Payer: Self-pay | Admitting: Hematology

## 2015-04-05 NOTE — Telephone Encounter (Signed)
New patient appt-s/w patient and gave np appt for 9/30 @ 10:45 w.Dr. Burr Medico

## 2015-04-06 NOTE — Progress Notes (Signed)
[place]  [phone] Fax:([phone] [place] Note   Patient Care Team: Dibas Koirala, MD as PCP - General (Family Medicine) Wilford Corner, MD as Consulting Physician (Gastroenterology) Jackolyn Confer, MD as Consulting Physician (General Surgery) Truitt Merle, MD as Consulting Physician (Hematology) 04/07/2015  CHIEF COMPLAINTS/PURPOSE OF CONSULTATION:  Newly diagnosed colon cancer    Colon cancer   03/30/2015 Initial Biopsy Sigmoid mass biopsy showed invasive adenocarcinoma. Cecal colon polyps showed tubular adenoma.   03/30/2015 Initial Diagnosis Colon cancer   03/30/2015 Procedure colonoscopy by Dr. Michail Sermon showed a fungating, infiltrative and ulcerated nonobstructing large mass in the sigmoid colon and at 20 cm proximal to the anus. The mass was partially circumferential no bleeding. A 10 mm polyps in the cecum was removed.   04/03/2015 Imaging CT chest, abdomen and pelvis with contrast showed nodular masslike area of clinical worsening at rectosigmoid junction, tiny pericolonic lymph nodes, bilateral pulmonary nodules measuring about 1 cm.    HISTORY OF PRESENTING ILLNESS:  Bill Wright 54 y.o. male is here because of recently newly diagnosed colon cancer.  He has had intermittent bloody stool for 2 years, it has been mild, mixed with stool, patient does not have any abdominal pain, constipation, change of his bowel habits, nausea, weight loss or other symptoms. He did not seek medical attention for this. He went to emergency room on 03/15/2015 for right flank pain, due to his kidney stone. CT scan incidentally found a 11 mm right lower lobe nodule and mucosal edema in the sigmoid colon. He saw his primary care physician, and was referred to GI Dr. Michail Sermon here at he underwent colonoscopy on 03/30/2015, which showed a fungating infiltrative and ulcerated nonobstructing large mass in the sigmoid colon, biopsy showed adenocarcinoma. CT chest  abdomen and pelvis showed multiple lung nodules measuring about 1 cm. He was referred to surgeon Dr. Zella Richer, who referred patient to [place] for further workup of his lung nodule and discuss chemotherapy.  He feels very well overall, denies any symptoms. He is a Freight forwarder at [employer], lives with his wife and 3 children. He never had screening colonoscopy prior the reason one, no significant past medical history, does not see doctors regularly.  MEDICAL HISTORY:  Past Medical History  Diagnosis Date  . Kidney calculi   . Hypercholesteremia     SURGICAL HISTORY: History reviewed. No pertinent past surgical history.  SOCIAL HISTORY: Social History   Social History  . Marital Status: Married    Spouse Name: N/A  . Number of Children: 3, age of 91, 2 and 69   . Years of Education: N/A   Occupational History  . Banker for [employer] [place]    Social History Main Topics  . Smoking status: Never Smoker   . Smokeless tobacco: Not on file  . Alcohol Use: No  . Drug Use: No  . Sexual Activity: Not on file   Other Topics Concern  . Not on file   Social History Narrative    FAMILY HISTORY: Family History  Problem Relation Age of Onset  . Cancer Mother 89    breast cancer   . Diabetes Maternal Grandmother     ALLERGIES:  has No Known Allergies.  MEDICATIONS:  Current Outpatient Prescriptions  Medication Sig Dispense Refill  . HYDROcodone-acetaminophen (NORCO) 5-325 MG per tablet Take 1 tablet by mouth every 6 (six) hours as needed for severe pain. (Patient not taking: Reported on 04/07/2015) 15 tablet 0  .  ibuprofen (ADVIL,MOTRIN) 200 MG tablet Take 800 mg by mouth every 6 (six) hours as needed for moderate pain.    . naproxen (NAPROSYN) 500 MG tablet Take 1 tablet (500 mg total) by mouth 2 (two) times daily as needed for mild pain, moderate pain or headache (TAKE WITH MEALS.). (Patient not taking: Reported on 04/07/2015) 20 tablet 0  . ondansetron (ZOFRAN) 8 MG tablet Take  1 tablet (8 mg total) by mouth every 8 (eight) hours as needed for nausea or vomiting. (Patient not taking: Reported on 04/07/2015) 10 tablet 0   No current facility-administered medications for this visit.    REVIEW OF SYSTEMS:   Constitutional: Denies fevers, chills or abnormal night sweats, no weight loss. Eyes: Denies blurriness of vision, double vision or watery eyes Ears, nose, mouth, throat, and face: Denies mucositis or sore throat Respiratory: Denies cough, dyspnea or wheezes Cardiovascular: Denies palpitation, chest discomfort or lower extremity swelling Gastrointestinal:  Denies nausea, heartburn or change in bowel habits Skin: Denies abnormal skin rashes Lymphatics: Denies new lymphadenopathy or easy bruising Neurological:Denies numbness, tingling or new weaknesses Behavioral/Psych: Mood is stable, no new changes  All other systems were reviewed with the patient and are negative.  PHYSICAL EXAMINATION: ECOG PERFORMANCE STATUS: 0 - Asymptomatic  Filed Vitals:   04/07/15 1048  BP: 132/68  Pulse: 85  Temp: 98.3 F (36.8 C)  Resp: 17   Filed Weights   04/07/15 1048  Weight: 224 lb 3.2 oz (101.696 kg)    GENERAL:alert, no distress and comfortable SKIN: skin color, texture, turgor are normal, no rashes or significant lesions EYES: normal, conjunctiva are pink and non-injected, sclera clear OROPHARYNX:no exudate, no erythema and lips, buccal mucosa, and tongue normal  NECK: supple, thyroid normal size, non-tender, without nodularity LYMPH:  no palpable lymphadenopathy in the cervical, axillary or inguinal LUNGS: clear to auscultation and percussion with normal breathing effort HEART: regular rate & rhythm and no murmurs and no lower extremity edema ABDOMEN:abdomen soft, non-tender and normal bowel sounds Musculoskeletal:no cyanosis of digits and no clubbing  PSYCH: alert & oriented x 3 with fluent speech NEURO: no focal motor/sensory deficits  LABORATORY DATA:  I  have reviewed the data as listed Lab Results  Component Value Date   WBC 11.3* 03/16/2015   HGB 13.8 03/16/2015   HCT 40.5 03/16/2015   MCV 83.7 03/16/2015   PLT 246 03/16/2015    Recent Labs  03/15/15 0925 03/16/15 1710  NA 137 138  K 4.0 4.0  CL 105 103  CO2 24 26  GLUCOSE 147* 149*  BUN 7 14  CREATININE 0.81 1.04  CALCIUM 9.0 9.4  GFRNONAA >60 >60  GFRAA >60 >60  PROT 6.6 7.2  ALBUMIN 3.5 3.7  AST 34 35  ALT 39 45  ALKPHOS 82 81  BILITOT 0.6 0.6   PATHOLOGY REPORT  Diagnosis 03/30/2015 1. Colon, polyp(s), cecal colon polyp, excision - TUBULAR ADENOMA. NO HIGH GRADE DYSPLASIA OR MALIGNANCY IDENTIFIED. 2. Colon, biopsy, sigmoid mass - INVASIVE ADENOCARCINOMA, SEE COMMENT. Microscopic Comment 2. Dr. Saralyn Pilar reviewed this case and concurs. The results were discussed with Dr. Michail Sermon on 03/31/15. (BNS:ds 03/31/15)   RADIOGRAPHIC STUDIES: I have personally reviewed the radiological images as listed and agreed with the findings in the report.  Ct Chest, abdomen and pelvis W Contrast 04/03/2015    IMPRESSION: Nodular masslike area of colonic wall thickening and rectosigmoid junction, consistent with known primary colon carcinoma.  Tiny pericolonic lymph nodes in sigmoid mesocolon measuring up to 6  mm, suspicious for local metastatic disease.  Diffuse hepatic steatosis.  No liver metastases identified.  Bilateral pulmonary nodules measuring approximately 1 cm, highly suspicious for pulmonary metastases.   Electronically Signed   By: Earle Gell M.D.   On: 04/03/2015 12:57   Ct Renal Stone Study 03/15/2015    IMPRESSION: 3 mm stone proximal right ureter causing partial obstruction of the right kidney  11 mm right lower lobe nodule. Differential includes benign and malignant nodules including metastatic disease. CT chest with contrast recommended for further evaluation. Consider performing contrast-enhanced CT abdomen pelvis at the time of the chest CT to evaluate for malignancy.   Sigmoid diverticulosis. Mucosal edema in the sigmoid colon could represent colonic neoplasm versus chronic diverticular change. This could be evaluated at CT abdomen pelvis with contrast. Endoscopy also recommended to exclude tumor.   Electronically Signed   By: Franchot Gallo M.D.   On: 03/15/2015 10:34    ASSESSMENT & PLAN:  54 year old male, without significant past medical history except kidney stone, presented with intermittent bloody stool for 2 years, and colonoscopy showed a large sigmoid colon mass, CT scan showed multiple (at least 4) nodules in bilateral lungs, measuring about 1 cm.  1. Sigmoid colon adenocarcinoma, [id number], probably stage IV with lung mets  -I reviewed her colonoscopy, CT scan findings and the biopsy results in great details with patient and his wife. -I personally reviewed his CT scan image with him, he has at least 4 lung nodules in bilateral lungs, measuring about 1 cm, highly suspicious for metastatic disease. I spoke with interventional radiologist Dr. Earleen Newport this morning, he feels it is feasible to have needle biopsy of his lung lesion. -I recommend a CT-guided lung mass biopsy by IR, risks involved with the procedure were discussed with patient, especially pneumothorax and in the bleeding, he agrees to proceed. -I also obtain a PET scan to evaluate his lung lesions and rule out other distant metastasis.  -If the lung biopsy confirms metastatic disease, I'll start him with systemic chemotherapy. -I'll obtain [employer] test on his tumor sample, to evaluate the MSI and KRAS/NRAS mutation status, if metastatic disease is confirmed. -I'll check his CEA, and repeat his CBC and CMP on his next visit. -His sigmoid colon mass is not obstructed, bleeding has been minimal, she does not need urgent surgery. But we did discuss the possibility of tomorrow obstruction, bleeding, ulceration, down the road and that he may need urgent surgery in that situation. -If he has excellent  response to systemic chemotherapy, colon surgery to remove the primary tumor is also reasonable. -If lung biopsy confirms metastases, I will start him with systemic chemo with FOLFOX or CAPEOX with Avastin.  -I'll see him back in 2 weeks after his above workup and finalize his treatment.   Plan -IR lung biopsy -PET scan -Chemotherapy class -I'll see him back in 2 weeks.  Orders Placed This Encounter  Procedures  . NM PET Image Initial (PI) Skull Base To Thigh    Standing Status: Future     Number of Occurrences:      Standing Expiration Date: 04/06/2016    Order Specific Question:  Reason for Exam (SYMPTOM  OR DIAGNOSIS REQUIRED)    Answer:  staging    Order Specific Question:  Preferred imaging location?    Answer:  [facility]  . CT Biopsy    Standing Status: Future     Number of Occurrences:      Standing Expiration Date: 04/06/2016  Scheduling Instructions:     I spoke with IR Dr. Earleen Newport this morning and he approved it    Order Specific Question:  Lab orders requested (DO NOT place separate lab orders, these will be automatically ordered during procedure specimen collection):    Answer:  Surgical Pathology    Order Specific Question:  Reason for Exam (SYMPTOM  OR DIAGNOSIS REQUIRED)    Answer:  multiple lung nodules, suspicious for metastatic disease    Order Specific Question:  Preferred imaging location?    Answer:  [facility]  . CBC with Differential    Standing Status: Standing     Number of Occurrences: 30     Standing Expiration Date: 04/06/2018  . Comprehensive metabolic panel (Cmet) - [facility]    Standing Status: Standing     Number of Occurrences: 30     Standing Expiration Date: 04/06/2018  . CEA    Standing Status: Standing     Number of Occurrences: 20     Standing Expiration Date: 04/06/2018    All questions were answered. The patient knows to call the clinic with any problems, questions or concerns. I spent 55 minutes counseling the patient  face to face. The total time spent in the appointment was 60 minutes and more than 50% was on counseling.     Truitt Merle, MD 04/07/2015 12:24 PM    You

## 2015-04-07 ENCOUNTER — Encounter: Payer: Self-pay | Admitting: *Deleted

## 2015-04-07 ENCOUNTER — Ambulatory Visit (HOSPITAL_BASED_OUTPATIENT_CLINIC_OR_DEPARTMENT_OTHER): Payer: BLUE CROSS/BLUE SHIELD | Admitting: Hematology

## 2015-04-07 ENCOUNTER — Telehealth: Payer: Self-pay | Admitting: Hematology

## 2015-04-07 ENCOUNTER — Encounter: Payer: Self-pay | Admitting: Hematology

## 2015-04-07 VITALS — BP 132/68 | HR 85 | Temp 98.3°F | Resp 17 | Ht 66.0 in | Wt 224.2 lb

## 2015-04-07 DIAGNOSIS — C189 Malignant neoplasm of colon, unspecified: Secondary | ICD-10-CM

## 2015-04-07 DIAGNOSIS — C187 Malignant neoplasm of sigmoid colon: Secondary | ICD-10-CM

## 2015-04-07 DIAGNOSIS — C7800 Secondary malignant neoplasm of unspecified lung: Secondary | ICD-10-CM | POA: Diagnosis not present

## 2015-04-07 MED ORDER — CAPECITABINE 150 MG PO TABS: 150.0000 mg | ORAL_TABLET | Freq: Two times a day (BID) | ORAL | Status: DC

## 2015-04-07 MED ORDER — CAPECITABINE 500 MG PO TABS: 2000.0000 mg | ORAL_TABLET | Freq: Two times a day (BID) | ORAL | Status: DC

## 2015-04-07 NOTE — Telephone Encounter (Signed)
sent Dr Burr Medico email to adv of refusal

## 2015-04-07 NOTE — Progress Notes (Signed)
Oncology Nurse Navigator Documentation  Oncology Nurse Navigator Flowsheets 04/07/2015  Referral date to RadOnc/MedOnc 04/04/2015  Navigator Encounter Type Initial MedOnc  Patient Visit Type Medonc  Treatment Phase Treatment  Barriers/Navigation Needs Family concerns;Education  Education Understanding Cancer/ Treatment Options;Coping with Diagnosis/ Prognosis;Newly Diagnosed Cancer Education;Preparing for Upcoming Surgery/ Treatment  Interventions Referrals;Education Method  Referrals Social Work  Leisure centre manager;Teach-back  Support Groups/Services GI;ACS  Time Spent with Patient 60  Met with patient and wife, Bill Wright during new patient visit. Explained the role of the GI Nurse Navigator and provided New Patient Packet with information on: 1. Colon cancer-Oxaliplatin and Xeloda 2. Support groups 3. Advanced Directives 4. Fall Safety Plan Answered questions, reviewed current treatment plan using TEACH back and provided emotional support. Provided copy of current treatment plan.  Patient does not want to tell his kids about the diagnosis. Suggested he really needs to share this with them. Will refer to CSW to provide help in this conversation. Discussed and provided printed information on PAC-benefit/risks. He really wants to work through treatment and has decided to do CAPOX regimen.   Merceda Elks, RN, BSN GI Oncology [place]

## 2015-04-07 NOTE — Telephone Encounter (Signed)
pt refused chemo class stated do not want to sch until after scans are done and results covered

## 2015-04-07 NOTE — Telephone Encounter (Signed)
per pof to sch pt appt-gave pt copy of avs-adv [facility] will call to sch scan & PET

## 2015-04-08 ENCOUNTER — Encounter: Payer: Self-pay | Admitting: Hematology

## 2015-04-10 ENCOUNTER — Encounter: Payer: Self-pay | Admitting: Hematology

## 2015-04-10 NOTE — Progress Notes (Signed)
I faxed biologics req for xeloda   150 and 500

## 2015-04-11 ENCOUNTER — Telehealth: Payer: Self-pay

## 2015-04-11 ENCOUNTER — Encounter: Payer: Self-pay | Admitting: Hematology

## 2015-04-11 NOTE — Progress Notes (Signed)
Per biologics script has been transferred to [facility] [phone]

## 2015-04-11 NOTE — Telephone Encounter (Signed)
Pearl from [facility] is calling to let Dr. Burr Medico know that the insurance is dictating that the Xeloda be filled by [facility].   The prescription will need prior authorization as well.  A confirmation fax will be sent to the [facility] care fax. [phone].

## 2015-04-11 NOTE — Telephone Encounter (Signed)
Left message in [name]'s voicemail stating that [name] is to take the Xeloda days 1-14 of chemotherapy.  The treatment plan is Xelox  (Capeox) every 21 days. She can call back to the [facility] if she has any further questions or concerns.

## 2015-04-12 ENCOUNTER — Ambulatory Visit (HOSPITAL_COMMUNITY)
Admission: RE | Admit: 2015-04-12 | Discharge: 2015-04-12 | Disposition: A | Discharge status: Home / Self Care | Payer: BLUE CROSS/BLUE SHIELD | Source: Ambulatory Visit | Attending: Hematology | Admitting: Hematology

## 2015-04-12 DIAGNOSIS — C7802 Secondary malignant neoplasm of left lung: Secondary | ICD-10-CM | POA: Diagnosis not present

## 2015-04-12 DIAGNOSIS — C189 Malignant neoplasm of colon, unspecified: Secondary | ICD-10-CM | POA: Diagnosis present

## 2015-04-12 DIAGNOSIS — C7801 Secondary malignant neoplasm of right lung: Secondary | ICD-10-CM | POA: Diagnosis not present

## 2015-04-12 LAB — GLUCOSE, CAPILLARY: Glucose-Capillary: 90 mg/dL (ref 65–99)

## 2015-04-12 MED ORDER — FLUDEOXYGLUCOSE F - 18 (FDG) INJECTION
11.0900 | Freq: Once | INTRAVENOUS | Status: DC | PRN
  Administered 2015-04-12: 11.09 via INTRAVENOUS
  Filled 2015-04-12: qty 11.09

## 2015-04-14 ENCOUNTER — Encounter: Payer: Self-pay | Admitting: *Deleted

## 2015-04-14 NOTE — Progress Notes (Signed)
Oncology Nurse Navigator Documentation  Oncology Nurse Navigator Flowsheets 04/14/2015  Referral date to RadOnc/MedOnc -  Navigator Encounter Type Telephone  Patient Visit Type -  Treatment Phase Staging process-bx 10/12  Barriers/Navigation Needs No barriers at this time  Education -  Interventions None required  Referrals Message to oral chemo pharmacist, Montel Clock to follow up on Xeloda   Education Method -  Support Groups/Services -  Time Spent with Patient 5

## 2015-04-18 ENCOUNTER — Other Ambulatory Visit: Payer: Self-pay | Admitting: Radiology

## 2015-04-19 ENCOUNTER — Encounter (HOSPITAL_COMMUNITY): Payer: Self-pay

## 2015-04-19 ENCOUNTER — Ambulatory Visit (HOSPITAL_COMMUNITY)
Admission: RE | Admit: 2015-04-19 | Discharge: 2015-04-19 | Disposition: A | Discharge status: Home / Self Care | Payer: BLUE CROSS/BLUE SHIELD | Source: Ambulatory Visit | Attending: Diagnostic Radiology | Admitting: Diagnostic Radiology

## 2015-04-19 ENCOUNTER — Ambulatory Visit (HOSPITAL_COMMUNITY)
Admission: RE | Admit: 2015-04-19 | Discharge: 2015-04-19 | Disposition: A | Discharge status: Home / Self Care | Payer: BLUE CROSS/BLUE SHIELD | Source: Ambulatory Visit | Attending: Hematology | Admitting: Hematology

## 2015-04-19 DIAGNOSIS — Z5309 Procedure and treatment not carried out because of other contraindication: Secondary | ICD-10-CM | POA: Insufficient documentation

## 2015-04-19 DIAGNOSIS — Z9889 Other specified postprocedural states: Secondary | ICD-10-CM

## 2015-04-19 DIAGNOSIS — Z79899 Other long term (current) drug therapy: Secondary | ICD-10-CM | POA: Diagnosis not present

## 2015-04-19 DIAGNOSIS — R918 Other nonspecific abnormal finding of lung field: Secondary | ICD-10-CM | POA: Diagnosis present

## 2015-04-19 DIAGNOSIS — C189 Malignant neoplasm of colon, unspecified: Secondary | ICD-10-CM | POA: Insufficient documentation

## 2015-04-19 LAB — CBC WITH DIFFERENTIAL/PLATELET
BASOS ABS: 0 10*3/uL (ref 0.0–0.1)
Basophils Relative: 1 %
Eosinophils Absolute: 0.1 10*3/uL (ref 0.0–0.7)
Eosinophils Relative: 1 %
HEMATOCRIT: 39.9 % (ref 39.0–52.0)
Hemoglobin: 13.6 g/dL (ref 13.0–17.0)
LYMPHS PCT: 28 %
Lymphs Abs: 1.8 10*3/uL (ref 0.7–4.0)
MCH: 29 pg (ref 26.0–34.0)
MCHC: 34.1 g/dL (ref 30.0–36.0)
MCV: 85.1 fL (ref 78.0–100.0)
Monocytes Absolute: 0.7 10*3/uL (ref 0.1–1.0)
Monocytes Relative: 11 %
NEUTROS ABS: 3.8 10*3/uL (ref 1.7–7.7)
Neutrophils Relative %: 59 %
Platelets: 248 10*3/uL (ref 150–400)
RBC: 4.69 MIL/uL (ref 4.22–5.81)
RDW: 13 % (ref 11.5–15.5)
WBC: 6.3 10*3/uL (ref 4.0–10.5)

## 2015-04-19 LAB — BASIC METABOLIC PANEL
ANION GAP: 8 (ref 5–15)
BUN: 12 mg/dL (ref 6–20)
CO2: 24 mmol/L (ref 22–32)
Calcium: 9.4 mg/dL (ref 8.9–10.3)
Chloride: 107 mmol/L (ref 101–111)
Creatinine, Ser: 0.73 mg/dL (ref 0.61–1.24)
GFR calc Af Amer: >60 mL/min (ref >60)
GLUCOSE: 93 mg/dL (ref 65–99)
POTASSIUM: 3.8 mmol/L (ref 3.5–5.1)
SODIUM: 139 mmol/L (ref 135–145)

## 2015-04-19 LAB — PROTIME-INR
INR: 1.04 (ref 0.00–1.49)
Prothrombin Time: 13.8 seconds (ref 11.6–15.2)

## 2015-04-19 LAB — APTT: aPTT: 28 seconds (ref 24–37)

## 2015-04-19 MED ORDER — MIDAZOLAM HCL 2 MG/2ML IJ SOLN
INTRAMUSCULAR | Status: AC | PRN
  Administered 2015-04-19 (×5): 1 mg via INTRAVENOUS

## 2015-04-19 MED ORDER — MIDAZOLAM HCL 2 MG/2ML IJ SOLN
INTRAMUSCULAR | Status: AC
  Filled 2015-04-19: qty 6

## 2015-04-19 MED ORDER — FENTANYL CITRATE (PF) 100 MCG/2ML IJ SOLN
INTRAMUSCULAR | Status: AC
  Filled 2015-04-19: qty 4

## 2015-04-19 MED ORDER — HYDROCODONE-ACETAMINOPHEN 5-325 MG PO TABS
1.0000 | ORAL_TABLET | ORAL | Status: DC | PRN
  Filled 2015-04-19: qty 2

## 2015-04-19 MED ORDER — SODIUM CHLORIDE 0.9 % IV SOLN
INTRAVENOUS | Status: DC
  Administered 2015-04-19: 250 mL via INTRAVENOUS

## 2015-04-19 MED ORDER — FENTANYL CITRATE (PF) 100 MCG/2ML IJ SOLN
INTRAMUSCULAR | Status: AC | PRN
  Administered 2015-04-19 (×2): 50 ug via INTRAVENOUS

## 2015-04-19 NOTE — H&P (Signed)
Chief Complaint: Patient was seen in consultation today for CT guided right lung mass biopsy  Referring Physician(s): Feng,Yan  History of Present Illness: Bill Wright is a 54 y.o. male with history of recently diagnosed colon cancer and subsequent PET scan revealing bilateral hypermetabolic lung nodules. He presents today for CT guided right lung nodule biopsy.   Past Medical History  Diagnosis Date  . Kidney calculi   . Hypercholesteremia     Past Surgical History  Procedure Laterality Date  . Colonoscopy  03/30/15    Allergies: Review of patient's allergies indicates no known allergies.  Medications: Prior to Admission medications   Medication Sig Start Date End Date Taking? Authorizing Provider  acetaminophen (TYLENOL) 500 MG tablet Take 500 mg by mouth every 6 (six) hours as needed.   Yes Historical Provider, MD  ibuprofen (ADVIL) 200 MG tablet Take 800 mg by mouth every 6 (six) hours as needed for moderate pain.   Yes Historical Provider, MD  capecitabine (XELODA) 150 MG tablet Take 1 tablet (150 mg total) by mouth 2 (two) times daily after a meal. Take on days 1-14 of chemotherapy. Patient not taking: Reported on 04/14/2015 04/07/15   Truitt Merle, MD  capecitabine (XELODA) 500 MG tablet Take 4 tablets (2,000 mg total) by mouth 2 (two) times daily after a meal. Take on days 1-14 of chemotherapy. Patient not taking: Reported on 04/14/2015 04/07/15   Truitt Merle, MD  HYDROcodone-acetaminophen [facility]) 5-325 MG per tablet Take 1 tablet by mouth every 6 (six) hours as needed for severe pain. Patient not taking: Reported on 04/07/2015 03/15/15   Mercedes Camprubi-Soms, PA-C  naproxen (NAPROSYN) 500 MG tablet Take 1 tablet (500 mg total) by mouth 2 (two) times daily as needed for mild pain, moderate pain or headache (TAKE WITH MEALS.). Patient not taking: Reported on 04/07/2015 03/15/15   Mercedes Camprubi-Soms, PA-C  ondansetron (ZOFRAN) 8 MG tablet Take 1 tablet (8 mg total) by mouth  every 8 (eight) hours as needed for nausea or vomiting. Patient not taking: Reported on 04/07/2015 03/15/15   Mercedes Camprubi-Soms, PA-C     Family History  Problem Relation Age of Onset  . Cancer Mother 67    breast cancer   . Diabetes Maternal Grandmother     Social History   Social History  . Marital Status: Married    Spouse Name: N/A  . Number of Children: N/A  . Years of Education: N/A   Social History Main Topics  . Smoking status: Never Smoker   . Smokeless tobacco: Not on file  . Alcohol Use: No  . Drug Use: No  . Sexual Activity: Not on file   Other Topics Concern  . 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  Respiratory: Negative for shortness of breath.        Occ cough  Cardiovascular: Negative for chest pain.  Gastrointestinal: Positive for blood in stool. Negative for nausea, vomiting and abdominal pain.  Genitourinary: Negative for dysuria and hematuria.  Musculoskeletal: Negative for back pain.  Neurological: Negative for headaches.  Hematological: Does not bruise/bleed easily.  Psychiatric/Behavioral: The patient is nervous/anxious.     Vital Signs: BP 145/87 mmHg  Pulse 91  Temp(Src) 98.1 F (36.7 C) (Oral)  Resp 18  Ht 5\' 6"  (1.676 m)  Wt 224 lb 3.2 oz (101.696 kg)  BMI 36.20 kg/m2  SpO2 99%  Physical Exam  Constitutional: He is oriented to person, place, and time. He appears well-developed and well-nourished.  Cardiovascular: Normal rate and regular rhythm.   Pulmonary/Chest: Effort normal and breath sounds normal.  Abdominal: Soft. Bowel sounds are normal. There is no tenderness.  Musculoskeletal: Normal range of motion. He exhibits no edema.  Neurological: He is alert and oriented to person,  place, and time.    Mallampati Score:     Imaging: Ct Chest W Contrast  04/03/2015  CLINICAL DATA:  Newly diagnosed colon carcinoma by colonoscopy. Indeterminate right lower lobe pulmonary nodule. EXAM: CT CHEST, ABDOMEN, AND PELVIS WITH CONTRAST TECHNIQUE: Multidetector CT imaging of the chest, abdomen and pelvis was performed following the standard protocol during bolus administration of intravenous contrast. CONTRAST:  189mL ISOVUE-300 IOPAMIDOL (ISOVUE-300) INJECTION 61% COMPARISON:  Noncontrast AP CT on 03/15/2015 FINDINGS: CT CHEST FINDINGS Mediastinum/Lymph Nodes: No masses, pathologically enlarged lymph nodes, or other significant abnormality. Lungs/Pleura: Several noncalcified pulmonary nodules are seen in both upper lobes and right lower lobe, most measuring approximately 1 cm in size. These are suspicious for bilateral pulmonary metastases. No evidence of pulmonary consolidation or pleural effusion. Musculoskeletal: No chest wall mass or suspicious bone lesions identified. CT ABDOMEN PELVIS FINDINGS Hepatobiliary: Diffuse hepatic steatosis demonstrated, however no liver masses are identified. Gallbladder is unremarkable. Pancreas: No mass, inflammatory changes, or other significant abnormality. Spleen: Within normal limits in size and appearance. Adrenals/Urinary Tract: No masses identified. No evidence of hydronephrosis. Stomach/Bowel: Colonic diverticulosis is noted, without evidence of acute diverticulitis. Nodular short segment masslike area of colonic wall thickening is seen near the rectosigmoid junction which measures approximately 3.5 x 4 cm and is consistent with known primary colon carcinoma. Vascular/Lymphatic: A few tiny pericolonic lymph nodes are seen in the sigmoid mesocolon, largest measuring 6 mm on image 100. No pathologically enlarged lymph nodes. No evidence of abdominal aortic aneursym. Reproductive: No mass or other significant abnormality. Other: None. Musculoskeletal:  No  suspicious bone lesions identified. IMPRESSION: Nodular masslike area of colonic wall thickening and rectosigmoid junction, consistent with known primary colon carcinoma. Tiny pericolonic lymph nodes in sigmoid mesocolon measuring up to 6 mm, suspicious for local metastatic disease. Diffuse hepatic steatosis.  No liver metastases identified. Bilateral pulmonary nodules measuring approximately 1 cm, highly suspicious for pulmonary metastases. Electronically Signed   By: Earle Gell M.D.   On: 04/03/2015 12:57   Ct Abdomen Pelvis W Contrast  04/03/2015  CLINICAL DATA:  Newly diagnosed colon carcinoma by colonoscopy. Indeterminate right lower lobe pulmonary nodule. EXAM: CT CHEST, ABDOMEN, AND PELVIS WITH CONTRAST TECHNIQUE: Multidetector CT imaging of the chest, abdomen and pelvis was performed following the standard protocol during bolus administration of intravenous contrast. CONTRAST:  18mL ISOVUE-300 IOPAMIDOL (ISOVUE-300) INJECTION 61% COMPARISON:  Noncontrast AP CT on 03/15/2015 FINDINGS: CT CHEST FINDINGS Mediastinum/Lymph Nodes: No masses, pathologically enlarged lymph nodes, or other significant abnormality. Lungs/Pleura: Several noncalcified pulmonary nodules are seen in both upper lobes and right lower lobe, most measuring approximately 1 cm in size. These are suspicious for bilateral pulmonary metastases. No evidence of pulmonary consolidation or pleural effusion. Musculoskeletal: No chest wall mass or suspicious bone lesions identified. CT ABDOMEN PELVIS FINDINGS Hepatobiliary: Diffuse hepatic steatosis demonstrated, however no liver masses are identified. Gallbladder is unremarkable. Pancreas: No mass, inflammatory changes, or other significant abnormality. Spleen: Within  normal limits in size and appearance. Adrenals/Urinary Tract: No masses identified. No evidence of hydronephrosis. Stomach/Bowel: Colonic diverticulosis is noted, without evidence of acute diverticulitis. Nodular short segment  masslike area of colonic wall thickening is seen near the rectosigmoid junction which measures approximately 3.5 x 4 cm and is consistent with known primary colon carcinoma. Vascular/Lymphatic: A few tiny pericolonic lymph nodes are seen in the sigmoid mesocolon, largest measuring 6 mm on image 100. No pathologically enlarged lymph nodes. No evidence of abdominal aortic aneursym. Reproductive: No mass or other significant abnormality. Other: None. Musculoskeletal:  No suspicious bone lesions identified. IMPRESSION: Nodular masslike area of colonic wall thickening and rectosigmoid junction, consistent with known primary colon carcinoma. Tiny pericolonic lymph nodes in sigmoid mesocolon measuring up to 6 mm, suspicious for local metastatic disease. Diffuse hepatic steatosis.  No liver metastases identified. Bilateral pulmonary nodules measuring approximately 1 cm, highly suspicious for pulmonary metastases. Electronically Signed   By: Earle Gell M.D.   On: 04/03/2015 12:57   Nm Pet Image Initial (pi) Skull Base To Thigh  04/12/2015  CLINICAL DATA:  Initial treatment strategy for colon carcinoma. EXAM: NUCLEAR MEDICINE PET SKULL BASE TO THIGH TECHNIQUE: 11.1 mCi F-18 FDG was injected intravenously. Full-ring PET imaging was performed from the skull base to thigh after the radiotracer. CT data was obtained and used for attenuation correction and anatomic localization. FASTING BLOOD GLUCOSE:  Value: 90 mg/dl COMPARISON:  CT on 04/02/2014 FINDINGS: NECK No hypermetabolic lymph nodes in the neck. CHEST No hypermetabolic mediastinal or hilar nodes. Multiple small bilateral pulmonary nodules are hypermetabolic and consistent with pulmonary metastases. Index nodule in the peripheral right mid lung measuring 10 mm on image 26 of series 8 has SUV max of 5.8. ABDOMEN/PELVIS No abnormal hypermetabolic activity within the liver, pancreas, adrenal glands, or spleen. Diffuse hepatic steatosis noted. Short segment masslike  hypermetabolic lesion is again seen near the rectosigmoid junction, with SUV max of 20.2. This is consistent with known primary colon carcinoma. Tiny pericolonic lymph nodes in the sigmoid mesocolon measuring up to 6 mm are too small to characterize by PET, but are suspicious for early lymph node metastases even though no significant hypermetabolic activity is seen. No hypermetabolic lymph nodes seen elsewhere within the abdomen or pelvis. SKELETON No focal hypermetabolic activity to suggest skeletal metastasis. IMPRESSION: Hypermetabolic colonic mass near rectosigmoid junction, consistent with known primary colon carcinoma. Tiny sub-cm pericolonic lymph nodes in sigmoid mesocolon are too small to characterize by PET, but are suspicious for early lymph node metastases. Bilateral pulmonary metastases. Electronically Signed   By: Earle Gell M.D.   On: 04/12/2015 14:00    Labs:  CBC:  Recent Labs  03/15/15 0925 03/16/15 1710 04/19/15 1125  WBC 5.6 11.3* 6.3  HGB 13.9 13.8 13.6  HCT 41.6 40.5 39.9  PLT 223 246 248    COAGS:  Recent Labs  04/19/15 1125  INR 1.04  APTT 28    BMP:  Recent Labs  03/15/15 0925 03/16/15 1710 04/19/15 1125  NA 137 138 139  K 4.0 4.0 3.8  CL 105 103 107  CO2 24 26 24   GLUCOSE 147* 149* 93  BUN 7 14 12   CALCIUM 9.0 9.4 9.4  CREATININE 0.81 1.04 0.73  GFRNONAA >60 >60 >60  GFRAA >60 >60 >60    LIVER FUNCTION TESTS:  Recent Labs  03/15/15 0925 03/16/15 1710  BILITOT 0.6 0.6  AST 34 35  ALT 39 45  ALKPHOS 82 81  PROT 6.6 7.2  ALBUMIN 3.5 3.7    TUMOR MARKERS: No results for input(s): AFPTM, CEA, CA199, CHROMGRNA in the last 8760 hours.  Assessment and Plan: Bill Wright is a 54 y.o. male with history of recently diagnosed colon cancer and subsequent PET scan revealing bilateral hypermetabolic lung nodules. He presents today for CT guided right lung nodule biopsy. Risks and benefits discussed with the patient/wife including, but not  limited to bleeding, infection, damage to adjacent structures, pneumothorax requiring chest tube placement or low yield requiring additional tests and death .All of the patient's questions were answered, patient is agreeable to proceed.Consent signed and in chart.     Thank you for this interesting consult.  I greatly enjoyed meeting Bill Wright and look forward to participating in their care.  A copy of this report was sent to the requesting provider on this date.  Signed: D. Rowe Robert 04/19/2015, 12:28 PM   I spent a total of 15 minutes in face to face in clinical consultation, greater than 50% of which was counseling/coordinating care for CT guided right lung mass biopsy

## 2015-04-19 NOTE — Procedures (Signed)
Attempted CT guided biopsy of RUL nodule.  Unable to place biopsy needle in lesion.  Placed patient prone to evaluate RLL nodule.  Nodule along right hemidiaphragm and felt to be very difficult biopsy with limited window.  Discussed with Dr. Burr Medico.  No biopsy obtained.  No immediate complication.

## 2015-04-19 NOTE — Progress Notes (Signed)
1 hour post lung bx CXR report was negative for pneumothorax and this was confirmed with Dr Anselm Pancoast. Pt is to be discharged at 1715 as ordered. Pt eating crackers/cheese and drinking juice without problems

## 2015-04-19 NOTE — Discharge Instructions (Signed)
Needle Biopsy of Lung, Care After °Refer to this sheet in the next few weeks. These instructions provide you with information on caring for yourself after your procedure. Your health care provider may also give you more specific instructions. Your treatment has been planned according to current medical practices, but problems sometimes occur. Call your health care provider if you have any problems or questions after your procedure. °WHAT TO EXPECT AFTER THE PROCEDURE °· A bandage will be applied over the area where the needle was inserted. You may be asked to apply pressure to the bandage for several minutes to ensure there is minimal bleeding. °· In most cases, you can leave when your needle biopsy procedure is completed. Do not drive yourself home. Someone else should take you home. °· If you received an IV sedative or general anesthetic, you will be taken to a comfortable place to relax while the medicine wears off. °· If you have upcoming travel scheduled, talk to your health care provider about when it is safe to travel by air after the procedure. °HOME CARE INSTRUCTIONS °· Expect to take it easy for the rest of the day. °· Protect the area where you received the needle biopsy by keeping the bandage in place for as long as instructed. °· You may feel some mild pain or discomfort in the area, but this should stop in a day or two. °· Take medicines only as directed by your health care provider. °SEEK MEDICAL CARE IF:  °· You have pain at the biopsy site that worsens or is not helped by medicine. °· You have swelling or drainage at the needle biopsy site. °· You have a fever. °SEEK IMMEDIATE MEDICAL CARE IF:  °· You have new or worsening shortness of breath. °· You have chest pain. °· You are coughing up blood. °· You have bleeding that does not stop with pressure or a bandage. °· You develop light-headedness or fainting. °  °This information is not intended to replace advice given to you by your health care  provider. Make sure you discuss any questions you have with your health care provider. °  °Document Released: 04/21/2007 Document Revised: 07/15/2014 Document Reviewed: 11/16/2012 °Elsevier Interactive Patient Education ©2016 [employer] °Lung Biopsy °A lung biopsy is a procedure in which a tissue sample is removed from the lung. The tissue can be examined under a microscope to help diagnose various lung disorders.  °LET YOUR HEALTH CARE PROVIDER KNOW ABOUT: °· Any allergies you have. °· All medicines you are taking, including vitamins, herbs, eye drops, creams, and over-the-counter medicines. °· Previous problems you or members of your family have had with the use of anesthetics. °· Any blood disorders or bleeding problems that you have. °· Previous surgeries you have had. °· Medical conditions you have. °RISKS AND COMPLICATIONS °Generally, a lung biopsy is a safe procedure. However, problems can occur and include: °· Collapse of the lung.   °· Bleeding.   °· Infection.   °BEFORE THE PROCEDURE °· Do not eat or drink anything after midnight on the night before the procedure or as directed by your health care provider. °· Ask your health care provider about changing or stopping your regular medicines. This is especially important if you are taking diabetes medicines or blood thinners. °· Plan to have someone take you home after the procedure. °PROCEDURE °Various methods can be used to perform a lung biopsy:  °· Needle biopsy. A biopsy needle is inserted into the lung. The needle is used to collect   the tissue sample. A CT scanner may be used to guide the needle to the right place in the lung. For this method, a medicine is used to numb the area where the biopsy sample will be taken (local anesthetic). °· Bronchoscopy. A flexible tube (bronchoscope) is inserted into your lungs by going through your mouth or nose. A needle or forceps is passed through the bronchoscope to remove the tissue sample. For this method,  medicine may be used to numb the back of your throat. °· Open biopsy. A cut (incision) is made in your chest. The tissue sample is then removed using surgical tools. The incision is closed with skin glue, skin adhesive strips, or stitches. For this method, you will be given medicine to make you sleep through the procedure (general anesthetic). °AFTER THE PROCEDURE °· Your recovery will be assessed and monitored. °· You might have soreness and tenderness at the site of the biopsy for a few days after the procedure. °· You might have a cough and some soreness in your throat for a few days if a bronchoscope was used. °  °This information is not intended to replace advice given to you by your health care provider. Make sure you discuss any questions you have with your health care provider. °  °Document Released: 09/12/2004 Document Revised: 07/15/2014 Document Reviewed: 12/06/2012 °Elsevier Interactive Patient Education ©2016 [employer] °Moderate Conscious Sedation, Adult °Sedation is the use of medicines to promote relaxation and relieve discomfort and anxiety. Moderate conscious sedation is a type of sedation. Under moderate conscious sedation you are less alert than normal but are still able to respond to instructions or stimulation. Moderate conscious sedation is used during short medical and dental procedures. It is milder than deep sedation or general anesthesia and allows you to return to your regular activities sooner. °LET YOUR HEALTH CARE PROVIDER KNOW ABOUT:  °· Any allergies you have. °· All medicines you are taking, including vitamins, herbs, eye drops, creams, and over-the-counter medicines. °· Use of steroids (by mouth or creams). °· Previous problems you or members of your family have had with the use of anesthetics. °· Any blood disorders you have. °· Previous surgeries you have had. °· Medical conditions you have. °· Possibility of pregnancy, if this applies. °· Use of cigarettes, alcohol, or  illegal drugs. °RISKS AND COMPLICATIONS °Generally, this is a safe procedure. However, as with any procedure, problems can occur. Possible problems include: °· Oversedation. °· Trouble breathing on your own. You may need to have a breathing tube until you are awake and breathing on your own. °· Allergic reaction to any of the medicines used for the procedure. °BEFORE THE PROCEDURE °· You may have blood tests done. These tests can help show how well your kidneys and liver are working. They can also show how well your blood clots. °· A physical exam will be done.   °· Only take medicines as directed by your health care provider. You may need to stop taking medicines (such as blood thinners, aspirin, or nonsteroidal anti-inflammatory drugs) before the procedure.   °· Do not eat or drink at least 6 hours before the procedure or as directed by your health care provider. °· Arrange for a responsible adult, family member, or friend to take you home after the procedure. He or she should stay with you for at least 24 hours after the procedure, until the medicine has worn off. °PROCEDURE  °· An intravenous (IV) catheter will be inserted into one of your veins. Medicine   will be able to flow directly into your body through this catheter. You may be given medicine through this tube to help prevent pain and help you relax. °· The medical or dental procedure will be done. °AFTER THE PROCEDURE °· You will stay in a recovery area until the medicine has worn off. Your blood pressure and pulse will be checked.   °·  Depending on the procedure you had, you may be allowed to go home when you can tolerate liquids and your pain is under control. °  °This information is not intended to replace advice given to you by your health care provider. Make sure you discuss any questions you have with your health care provider. °  °Document Released: 03/19/2001 Document Revised: 07/15/2014 Document Reviewed: 03/01/2013 °Elsevier Interactive Patient  Education ©2016 [employer] °Moderate Conscious Sedation, Adult, Care After °Refer to this sheet in the next few weeks. These instructions provide you with information on caring for yourself after your procedure. Your health care provider may also give you more specific instructions. Your treatment has been planned according to current medical practices, but problems sometimes occur. Call your health care provider if you have any problems or questions after your procedure. °WHAT TO EXPECT AFTER THE PROCEDURE  °After your procedure: °· You may feel sleepy, clumsy, and have poor balance for several hours. °· Vomiting may occur if you eat too soon after the procedure. °HOME CARE INSTRUCTIONS °· Do not participate in any activities where you could become injured for at least 24 hours. Do not: °¨ Drive. °¨ Swim. °¨ Ride a bicycle. °¨ Operate heavy machinery. °¨ Cook. °¨ Use power tools. °¨ Climb ladders. °¨ Work from a high place. °· Do not make important decisions or sign legal documents until you are improved. °· If you vomit, drink water, juice, or soup when you can drink without vomiting. Make sure you have little or no nausea before eating solid foods. °· Only take over-the-counter or prescription medicines for pain, discomfort, or fever as directed by your health care provider. °· Make sure you and your family fully understand everything about the medicines given to you, including what side effects may occur. °· You should not drink alcohol, take sleeping pills, or take medicines that cause drowsiness for at least 24 hours. °· If you smoke, do not smoke without supervision. °· If you are feeling better, you may resume normal activities 24 hours after you were sedated. °· Keep all appointments with your health care provider. °SEEK MEDICAL CARE IF: °· Your skin is pale or bluish in color. °· You continue to feel nauseous or vomit. °· Your pain is getting worse and is not helped by medicine. °· You have bleeding or  swelling. °· You are still sleepy or feeling clumsy after 24 hours. °SEEK IMMEDIATE MEDICAL CARE IF: °· You develop a rash. °· You have difficulty breathing. °· You develop any type of allergic problem. °· You have a fever. °MAKE SURE YOU: °· Understand these instructions. °· Will watch your condition. °· Will get help right away if you are not doing well or get worse. °  °This information is not intended to replace advice given to you by your health care provider. Make sure you discuss any questions you have with your health care provider. °  °Document Released: 04/14/2013 Document Revised: 07/15/2014 Document Reviewed: 04/14/2013 °Elsevier Interactive Patient Education ©2016 [employer]vier Inc. ° °

## 2015-04-20 ENCOUNTER — Encounter: Payer: Self-pay | Admitting: Pharmacist

## 2015-04-20 DIAGNOSIS — Z5111 Encounter for antineoplastic chemotherapy: Secondary | ICD-10-CM | POA: Insufficient documentation

## 2015-04-20 NOTE — Progress Notes (Signed)
Spoke with patient today as well as [facility], site. Specialty pharmacy has had prescription for Xeloda now for over 1 week. They have only just today faxed [place] prior auth request form after I had patient call them yesterday to complain. PA forms have been signed and faxed in and [facility] has stated they will expedite the approval process. Hopefully we will know approval and copay status by patient's follow up visit tomorrow (Friday 04/21/15) with Dr. Burr Medico.   Thank you,  Montel Clock, PharmD, [facility]

## 2015-04-21 ENCOUNTER — Telehealth: Payer: Self-pay | Admitting: Hematology

## 2015-04-21 ENCOUNTER — Telehealth: Payer: Self-pay | Admitting: Pharmacist

## 2015-04-21 ENCOUNTER — Encounter: Payer: Self-pay | Admitting: Hematology

## 2015-04-21 ENCOUNTER — Ambulatory Visit (HOSPITAL_BASED_OUTPATIENT_CLINIC_OR_DEPARTMENT_OTHER): Payer: BLUE CROSS/BLUE SHIELD | Admitting: Hematology

## 2015-04-21 ENCOUNTER — Other Ambulatory Visit (HOSPITAL_BASED_OUTPATIENT_CLINIC_OR_DEPARTMENT_OTHER): Payer: BLUE CROSS/BLUE SHIELD

## 2015-04-21 ENCOUNTER — Other Ambulatory Visit: Payer: BLUE CROSS/BLUE SHIELD

## 2015-04-21 VITALS — BP 126/77 | HR 82 | Temp 97.9°F | Resp 20 | Ht 66.0 in | Wt 222.8 lb

## 2015-04-21 DIAGNOSIS — C7800 Secondary malignant neoplasm of unspecified lung: Secondary | ICD-10-CM

## 2015-04-21 DIAGNOSIS — C189 Malignant neoplasm of colon, unspecified: Secondary | ICD-10-CM

## 2015-04-21 DIAGNOSIS — C187 Malignant neoplasm of sigmoid colon: Secondary | ICD-10-CM

## 2015-04-21 LAB — CBC WITH DIFFERENTIAL/PLATELET
BASO%: 0.9 % (ref 0.0–2.0)
BASOS ABS: 0.1 10*3/uL (ref 0.0–0.1)
EOS ABS: 0.1 10*3/uL (ref 0.0–0.5)
EOS%: 1.2 % (ref 0.0–7.0)
HCT: 41.6 % (ref 38.4–49.9)
HEMOGLOBIN: 14.1 g/dL (ref 13.0–17.1)
LYMPH%: 24.6 % (ref 14.0–49.0)
MCH: 28.4 pg (ref 27.2–33.4)
MCHC: 33.8 g/dL (ref 32.0–36.0)
MCV: 84.1 fL (ref 79.3–98.0)
MONO#: 0.7 10*3/uL (ref 0.1–0.9)
MONO%: 11.1 % (ref 0.0–14.0)
NEUT%: 62.2 % (ref 39.0–75.0)
NEUTROS ABS: 4 10*3/uL (ref 1.5–6.5)
PLATELETS: 249 10*3/uL (ref 140–400)
RBC: 4.95 10*6/uL (ref 4.20–5.82)
RDW: 13.6 % (ref 11.0–14.6)
WBC: 6.4 10*3/uL (ref 4.0–10.3)
lymph#: 1.6 10*3/uL (ref 0.9–3.3)

## 2015-04-21 LAB — COMPREHENSIVE METABOLIC PANEL (CC13)
ALBUMIN: 4.1 g/dL (ref 3.5–5.0)
ALK PHOS: 91 U/L (ref 40–150)
ALT: 34 U/L (ref 0–55)
ANION GAP: 10 meq/L (ref 3–11)
AST: 20 U/L (ref 5–34)
BILIRUBIN TOTAL: 0.4 mg/dL (ref 0.20–1.20)
BUN: 11.9 mg/dL (ref 7.0–26.0)
CO2: 24 mEq/L (ref 22–29)
Calcium: 9.4 mg/dL (ref 8.4–10.4)
Chloride: 107 mEq/L (ref 98–109)
Creatinine: 0.8 mg/dL (ref 0.7–1.3)
Glucose: 101 mg/dl (ref 70–140)
Potassium: 3.9 mEq/L (ref 3.5–5.1)
Sodium: 140 mEq/L (ref 136–145)
TOTAL PROTEIN: 7.7 g/dL (ref 6.4–8.3)

## 2015-04-21 MED ORDER — ONDANSETRON HCL 8 MG PO TABS: 8.0000 mg | ORAL_TABLET | Freq: Two times a day (BID) | ORAL | Status: DC

## 2015-04-21 NOTE — Progress Notes (Addendum)
Bill Wright  [phone] [phone]  Clinic Follow Up Note   Patient Care Team: Dibas Koirala, MD as PCP - General (Family Medicine) Wilford Corner, MD as Consulting Physician (Gastroenterology) Jackolyn Confer, MD as Consulting Physician (General Surgery) Truitt Merle, MD as Consulting Physician (Hematology) 04/21/2015  CHIEF COMPLAINTS:  Follow Up colon cancer  Oncology History   Colon cancer metastasized to lung [facility])   Staging form: Colon and Rectum, AJCC 7th Edition     Clinical stage from 03/30/2015: Stage Unknown ([place], N1, M1) - Unsigned       Colon cancer metastasized to lung ([place])   03/30/2015 Initial Biopsy Sigmoid mass biopsy showed invasive adenocarcinoma. Cecal colon polyps showed tubular adenoma.   03/30/2015 Initial Diagnosis Colon cancer   03/30/2015 Procedure colonoscopy by Dr. Michail Sermon showed a fungating, infiltrative and ulcerated nonobstructing large mass in the sigmoid colon and at 20 cm proximal to the anus. The mass was partially circumferential no bleeding. A 10 mm polyps in the cecum was removed.   04/03/2015 Imaging CT chest, abdomen and pelvis with contrast showed nodular masslike area of clinical worsening at rectosigmoid junction, tiny pericolonic lymph nodes, bilateral pulmonary nodules measuring about 1 cm.   04/12/2015 PET scan Hypermetabolic colonic mass near rectosigmoid junction, tiny subcentimeter paracolonic lymph nodes. Bilateral pulmonary metastasis.   04/19/2015 Procedure CT-guided lung nodule biopsy attempted, unsuccessful.    HISTORY OF PRESENTING ILLNESS:  Bill Wright 54 y.o. male is here because of recently newly diagnosed colon cancer.  He has had intermittent bloody stool for 2 years, it has been mild, mixed with stool, patient does not have any abdominal pain, constipation, change of his bowel habits, nausea, weight loss or other symptoms. He did not seek medical attention for this. He went to emergency  room on 03/15/2015 for right flank pain, due to his kidney stone. CT scan incidentally found a 11 mm right lower lobe nodule and mucosal edema in the sigmoid colon. He saw his primary care physician, and was referred to GI Dr. Michail Sermon here at he underwent colonoscopy on 03/30/2015, which showed a fungating infiltrative and ulcerated nonobstructing large mass in the sigmoid colon, biopsy showed adenocarcinoma. CT chest abdomen and pelvis showed multiple lung nodules measuring about 1 cm. He was referred to surgeon Dr. Zella Richer, who referred patient to [place] for further workup of his lung nodule and discuss chemotherapy.  He feels very well overall, denies any symptoms. 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 He never had screening colonoscopy prior the reason one, no significant past medical history, does not see doctors regularly.  INTERIM HISTORY [name] returns for follow-up. He underwent CT-guided lung nodule biopsy 2 days ago, and was unsuccessful. He tolerated the procedure well. He is accompanied to this clinic by his wife today to finalize his treatment plan. He feels well, no abdominal discomfort, nausea, or other GI symptoms. He noticed mild dry cough at work and in our clinic here, but not much at home. He felt this related to the air conditioning. No chest pain, dyspnea. He has good appetite and eating well.  MEDICAL HISTORY:  Past Medical History  Diagnosis Date  . Kidney calculi   . Hypercholesteremia     SURGICAL HISTORY: Past Surgical History  Procedure Laterality Date  . Colonoscopy  03/30/15    SOCIAL HISTORY: Social History   Social History  . Marital Status: Married    Spouse Name: N/A  . Number  of Children: 3, age of 75, 62 and 13   . Years of Education: N/A   Occupational History  . Banker for [employer] [place]    Social History Main Topics  . Smoking status: Never Smoker   . Smokeless tobacco: Not on file  . Alcohol Use: No  . Drug  Use: No  . Sexual Activity: Not on file   Other Topics Concern  . Not on file   Social History Narrative    FAMILY HISTORY: Family History  Problem Relation Age of Onset  . Cancer Mother 48    breast cancer   . Diabetes Maternal Grandmother     ALLERGIES:  has No Known Allergies.  MEDICATIONS:  Current Outpatient Prescriptions  Medication Sig Dispense Refill  . acetaminophen (TYLENOL) 500 MG tablet Take 500 mg by mouth every 6 (six) hours as needed.    . capecitabine (XELODA) 150 MG tablet Take 1 tablet (150 mg total) by mouth 2 (two) times daily after a meal. Take on days 1-14 of chemotherapy. (Patient not taking: Reported on 04/14/2015) 28 tablet 3  . capecitabine (XELODA) 500 MG tablet Take 4 tablets (2,000 mg total) by mouth 2 (two) times daily after a meal. Take on days 1-14 of chemotherapy. (Patient not taking: Reported on 04/14/2015) 112 tablet 3  . ondansetron (ZOFRAN) 8 MG tablet Take 1 tablet (8 mg total) by mouth every 8 (eight) hours as needed for nausea or vomiting. (Patient not taking: Reported on 04/07/2015) 10 tablet 0   No current facility-administered medications for this visit.    REVIEW OF SYSTEMS:   Constitutional: Denies fevers, chills or abnormal night sweats, no weight loss. Eyes: Denies blurriness of vision, double vision or watery eyes Ears, nose, mouth, throat, and face: Denies mucositis or sore throat Respiratory: Denies cough, dyspnea or wheezes Cardiovascular: Denies palpitation, chest discomfort or lower extremity swelling Gastrointestinal:  Denies nausea, heartburn or change in bowel habits Skin: Denies abnormal skin rashes Lymphatics: Denies new lymphadenopathy or easy bruising Neurological:Denies numbness, tingling or new weaknesses Behavioral/Psych: Mood is stable, no new changes  All other systems were reviewed with the patient and are negative.  PHYSICAL EXAMINATION: ECOG PERFORMANCE STATUS: 0 - Asymptomatic  Filed Vitals:   04/21/15  1213  BP: 126/77  Pulse: 82  Temp: 97.9 F (36.6 C)  Resp: 20   Filed Weights   04/21/15 1213  Weight: 222 lb 12.8 oz (101.061 kg)    GENERAL:alert, no distress and comfortable SKIN: skin color, texture, turgor are normal, no rashes or significant lesions EYES: normal, conjunctiva are pink and non-injected, sclera clear OROPHARYNX:no exudate, no erythema and lips, buccal mucosa, and tongue normal  NECK: supple, thyroid normal size, non-tender, without nodularity LYMPH:  no palpable lymphadenopathy in the cervical, axillary or inguinal LUNGS: clear to auscultation and percussion with normal breathing effort HEART: regular rate & rhythm and no murmurs and no lower extremity edema ABDOMEN:abdomen soft, non-tender and normal bowel sounds Musculoskeletal:no cyanosis of digits and no clubbing  PSYCH: alert & oriented x 3 with fluent speech NEURO: no focal motor/sensory deficits  LABORATORY DATA:  I have reviewed the data as listed Lab Results  Component Value Date   WBC 6.4 04/21/2015   HGB 14.1 04/21/2015   HCT 41.6 04/21/2015   MCV 84.1 04/21/2015   PLT 249 04/21/2015    Recent Labs  03/15/15 0925 03/16/15 1710 04/19/15 1125 04/21/15 1152  NA 137 138 139 140  K 4.0 4.0 3.8 3.9  CL  105 103 107  --   CO2 _0 GLUCOSE 147* 149* 93 101  BUN _1 11.9  CREATININE 0.81 1.04 0.73 0.8  CALCIUM 9.0 9.4 9.4 9.4  GFRNONAA >60 >60 >60  --   GFRAA >60 >60 >60  --   PROT 6.6 7.2  --  7.7  ALBUMIN 3.5 3.7  --  4.1  AST 34 35  --  20  ALT 39 45  --  34  ALKPHOS 82 81  --  91  BILITOT 0.6 0.6  --  0.40   PATHOLOGY REPORT  Diagnosis 03/30/2015 1. Colon, polyp(s), cecal colon polyp, excision - TUBULAR ADENOMA. NO HIGH GRADE DYSPLASIA OR MALIGNANCY IDENTIFIED. 2. Colon, biopsy, sigmoid mass - INVASIVE ADENOCARCINOMA, SEE COMMENT. Microscopic Comment 2. Dr. Saralyn Pilar reviewed this case and concurs. The results were discussed with Dr. Michail Sermon on 03/31/15.  (BNS:ds 03/31/15)   RADIOGRAPHIC STUDIES: I have personally reviewed the radiological images as listed and agreed with the findings in the report.  Ct Chest, abdomen and pelvis W Contrast 04/03/2015    IMPRESSION: Nodular masslike area of colonic wall thickening and rectosigmoid junction, consistent with known primary colon carcinoma.  Tiny pericolonic lymph nodes in sigmoid mesocolon measuring up to 6 mm, suspicious for local metastatic disease.  Diffuse hepatic steatosis.  No liver metastases identified.  Bilateral pulmonary nodules measuring approximately 1 cm, highly suspicious for pulmonary metastases.   Electronically Signed   By: Earle Gell M.D.   On: 04/03/2015 12:57   Ct Renal Stone Study 03/15/2015    IMPRESSION: 3 mm stone proximal right ureter causing partial obstruction of the right kidney  11 mm right lower lobe nodule. Differential includes benign and malignant nodules including metastatic disease. CT chest with contrast recommended for further evaluation. Consider performing contrast-enhanced CT abdomen pelvis at the time of the chest CT to evaluate for malignancy.  Sigmoid diverticulosis. Mucosal edema in the sigmoid colon could represent colonic neoplasm versus chronic diverticular change. This could be evaluated at CT abdomen pelvis with contrast. Endoscopy also recommended to exclude tumor.   Electronically Signed   By: Franchot Gallo M.D.   On: 03/15/2015 10:34   PET 04/12/2015 IMPRESSION: Hypermetabolic colonic mass near rectosigmoid junction, consistent with known primary colon carcinoma.  Tiny sub-cm pericolonic lymph nodes in sigmoid mesocolon are too small to characterize by PET, but are suspicious for early lymph node metastases.  Bilateral pulmonary metastases    ASSESSMENT & PLAN:  54 year old male, without significant past medical history except kidney stone, presented with intermittent bloody stool for 2 years, and colonoscopy showed a large sigmoid colon mass,  CT scan showed multiple (at least 4) nodules in bilateral lungs, measuring about 1 cm.  1. Sigmoid colon adenocarcinoma, TxN1M1, probably stage IV with lung mets  -I reviewed her colonoscopy, CT scan findings and the biopsy results in great details with patient and his wife. -I personally reviewed his CT scan image with him, he has at least 4 lung nodules in bilateral lungs, measuring about 1 cm, highly suspicious for metastatic disease.  -I reviewed his PET CT scan image with patient and his wife in person. His multiple lung nodules are PET positive with max SUV 5.8. These are most consistent with metastatic disease. Unfortunately CT-guided lung nodule biopsy was attempted but unsuccessful. -His sigmoid colon mass is not obstructed, bleeding has been minimal, she does not need urgent surgery. But we did discuss the possibility of tomorrow obstruction, bleeding, ulceration,  down the road and that he may need urgent surgery in that situation. -I recommend him to start systemic chemotherapy with FOLFOX or Capeox (capecitabine 2 weeks on, and one-week off, and oxaliplatin, every 3 weeks), for his disease control. He opted Capeox.  -Chemotherapy consent: Side effects including but does not not limited to, fatigue, nausea, vomiting, diarrhea, hair loss, neuropathy, fluid retention, renal and kidney dysfunction, neutropenic fever, needed for blood transfusion, bleeding, coronary artery disease and heart attack, were discussed with patient in great detail. He agrees to proceed. -I'll request a Foundation one test on his colon mass biopsy.If his tumor does not have RAS mutation, I'll add on EGFR inhibitor. Otherwise I'll add Avastin from cycle 2. -If he has excellent response to systemic chemotherapy, colon surgery to remove the primary tumor is also reasonable. -I'll tentatively schedule his first cycle to start next Thursday. His capecitabine has been approved and will be delivered early next  week.  Plan -first cycle Capecitabine and oxaliplatin on 10/20.  -I will see him on 10/31 or 11/1 for toxicity check up.  All questions were answered. The patient knows to call the clinic with any problems, questions or concerns. I spent 30 minutes counseling the patient face to face. The total time spent in the appointment was 40 minutes and more than 50% was on counseling.     Truitt Merle, MD 04/21/2015 4:53 PM

## 2015-04-21 NOTE — Telephone Encounter (Signed)
Contacted [facility] today. Xeloda has received approval. 04/20/15-04/19/16. [facility] will be [facility] patient for delivery. Hopefully early next week.   Thank you,  Montel Clock, PharmD, [facility]

## 2015-04-21 NOTE — Telephone Encounter (Signed)
Gave adn printed appt sched and avs for pt for OCT and NOV °

## 2015-04-22 LAB — CEA: CEA: <0.5 ng/mL (ref 0.0–5.0)

## 2015-04-26 ENCOUNTER — Encounter: Payer: Self-pay | Admitting: Pharmacist

## 2015-04-26 ENCOUNTER — Other Ambulatory Visit: Payer: BLUE CROSS/BLUE SHIELD

## 2015-04-26 ENCOUNTER — Encounter: Payer: Self-pay | Admitting: *Deleted

## 2015-04-26 NOTE — Progress Notes (Signed)
Oral Chemotherapy Pharmacist Encounter   I spoke with patient and wife today during chemotherapy education class for overview of new oral chemotherapy medication: Xeloda. This will be used in combination with oxaliplatin. Pt is doing well. [facility] is being [facility] been [facility] during this process which has delayed delivery of Xeloda. Rx was [facility] by [facility] on 10/4. Prior auth request was not sent to our office until 10/11. This was faxed back completed same day.   Pt received approval on 10/14 which I confirmed with [facility] on 10/14. They were to contact patient to set up delivery. This did not take place. I called [facility] again this morning and was requested to verify the prescription before [facility] could dispense. I verified Rx verbally over the telephone. [facility] stated they would expedite this order and call patient today as patient is already in the system and has approval/prior auth. Medication should be delivered Thursday or Friday so Mr. Sgro can start on Day 1 or Day 2 of Cycle 1 in combination with oxali (which is scheduled for 10/20)  Counseled patient on administration, dosing, side effects, safe handling, and monitoring. Side effects include but not limited to: fatigue, diarrhea, nausea, vomitting, mouth sores, hand-foot syndrome, and elevated liver enzymes.  Mr. Fritze voiced understanding and appreciation. His dose is 2150 mg twice daily of xeloda on Days 1-14 with 7 days off. He will take four tables of the 500 mg strength along with one tablet of the 150 mg strength twice daily.   All questions answered.  Will follow up with patient regarding insurance and pharmacy delivery from [facility] this week. Will follow up in 1-2 weeks for adherence and toxicity management.   Thank you,  Montel Clock, PharmD, [facility]

## 2015-04-27 ENCOUNTER — Encounter: Payer: Self-pay | Admitting: *Deleted

## 2015-04-27 ENCOUNTER — Other Ambulatory Visit (HOSPITAL_BASED_OUTPATIENT_CLINIC_OR_DEPARTMENT_OTHER): Payer: BLUE CROSS/BLUE SHIELD

## 2015-04-27 ENCOUNTER — Ambulatory Visit (HOSPITAL_BASED_OUTPATIENT_CLINIC_OR_DEPARTMENT_OTHER): Payer: BLUE CROSS/BLUE SHIELD

## 2015-04-27 VITALS — BP 137/77 | HR 98 | Temp 98.3°F | Resp 20

## 2015-04-27 DIAGNOSIS — Z5111 Encounter for antineoplastic chemotherapy: Secondary | ICD-10-CM | POA: Diagnosis not present

## 2015-04-27 DIAGNOSIS — C187 Malignant neoplasm of sigmoid colon: Secondary | ICD-10-CM

## 2015-04-27 DIAGNOSIS — C7800 Secondary malignant neoplasm of unspecified lung: Secondary | ICD-10-CM

## 2015-04-27 DIAGNOSIS — C189 Malignant neoplasm of colon, unspecified: Secondary | ICD-10-CM

## 2015-04-27 LAB — CBC WITH DIFFERENTIAL/PLATELET
BASO%: 0.8 % (ref 0.0–2.0)
Basophils Absolute: 0.1 10*3/uL (ref 0.0–0.1)
EOS ABS: 0.2 10*3/uL (ref 0.0–0.5)
EOS%: 2.3 % (ref 0.0–7.0)
HCT: 39.1 % (ref 38.4–49.9)
HGB: 13.2 g/dL (ref 13.0–17.1)
LYMPH%: 23.5 % (ref 14.0–49.0)
MCH: 28.3 pg (ref 27.2–33.4)
MCHC: 33.7 g/dL (ref 32.0–36.0)
MCV: 84.1 fL (ref 79.3–98.0)
MONO#: 0.6 10*3/uL (ref 0.1–0.9)
MONO%: 9.2 % (ref 0.0–14.0)
NEUT#: 4.2 10*3/uL (ref 1.5–6.5)
NEUT%: 64.2 % (ref 39.0–75.0)
PLATELETS: 254 10*3/uL (ref 140–400)
RBC: 4.66 10*6/uL (ref 4.20–5.82)
RDW: 13.7 % (ref 11.0–14.6)
WBC: 6.6 10*3/uL (ref 4.0–10.3)
lymph#: 1.6 10*3/uL (ref 0.9–3.3)

## 2015-04-27 LAB — COMPREHENSIVE METABOLIC PANEL (CC13)
ALT: 35 U/L (ref 0–55)
ANION GAP: 8 meq/L (ref 3–11)
AST: 19 U/L (ref 5–34)
Albumin: 3.7 g/dL (ref 3.5–5.0)
Alkaline Phosphatase: 89 U/L (ref 40–150)
BILIRUBIN TOTAL: 0.32 mg/dL (ref 0.20–1.20)
BUN: 12.3 mg/dL (ref 7.0–26.0)
CHLORIDE: 106 meq/L (ref 98–109)
CO2: 25 meq/L (ref 22–29)
Calcium: 9.2 mg/dL (ref 8.4–10.4)
Creatinine: 0.9 mg/dL (ref 0.7–1.3)
Glucose: 145 mg/dl — ABNORMAL HIGH (ref 70–140)
Potassium: 4.1 mEq/L (ref 3.5–5.1)
Sodium: 139 mEq/L (ref 136–145)
Total Protein: 6.9 g/dL (ref 6.4–8.3)

## 2015-04-27 MED ORDER — SODIUM CHLORIDE 0.9 % IV SOLN
Freq: Once | INTRAVENOUS | Status: AC
  Administered 2015-04-27: 13:00:00 via INTRAVENOUS
  Filled 2015-04-27: qty 4

## 2015-04-27 MED ORDER — OXALIPLATIN CHEMO INJECTION 100 MG/20ML
130.0000 mg/m2 | Freq: Once | INTRAVENOUS | Status: AC
  Administered 2015-04-27: 285 mg via INTRAVENOUS
  Filled 2015-04-27: qty 57

## 2015-04-27 MED ORDER — DEXTROSE 5 % IV SOLN
Freq: Once | INTRAVENOUS | Status: AC
  Administered 2015-04-27: 13:00:00 via INTRAVENOUS

## 2015-04-27 NOTE — Progress Notes (Signed)
Spoke with Tu RN, Dr. Ernestina Penna nurse to clarify treatment orders for today. Per Dr. Burr Medico pt to receive only oxaliplatin today. Pt needs to start taking Xeloda.   Instructed pt to start taking Xeloda. Pt states that Xeloda has not yet been delivered, but will take when it arrives.  1510 Pt reported mild tingling and numbness in tongue. Denies throat swelling or itchiness. This RN to continue to monitor. Pharmacist Montel Clock at chairside to assist with Xeloda prescription. Per pharmacist this is a mild side effect to monitor and continue treatment at this time.  1522 Pt states that tingling and numbness in tongue has lessened.

## 2015-04-27 NOTE — Progress Notes (Signed)
Oncology Nurse Navigator Documentation  Oncology Nurse Navigator Flowsheets 04/27/2015  Referral date to RadOnc/MedOnc -  Navigator Encounter Type Treatment  Patient Visit Type Medonc  Treatment Phase First Chemo Tx  Barriers/Navigation Needs Family concerns-still does not have his Xeloda (was told co pay is $5) Concerned about his FMLA forms he left yesterday with managed care.  Education   Interventions Spoke with Montel Clock, RPh--he is contacting his pharmacy; managed care still has his forms  Referrals -  Education Method Verbal  Support Groups/Services -  Time Spent with Patient 10

## 2015-04-27 NOTE — Patient Instructions (Signed)
[place] Discharge Instructions for Patients Receiving Chemotherapy  Today you received the following chemotherapy agents oxaliplatin  To help prevent nausea and vomiting after your treatment, we encourage you to take your nausea medication    If you develop nausea and vomiting that is not controlled by your nausea medication, call the clinic.   BELOW ARE SYMPTOMS THAT SHOULD BE REPORTED IMMEDIATELY:  *FEVER GREATER THAN 100.5 F  *CHILLS WITH OR WITHOUT FEVER  NAUSEA AND VOMITING THAT IS NOT CONTROLLED WITH YOUR NAUSEA MEDICATION  *UNUSUAL SHORTNESS OF BREATH  *UNUSUAL BRUISING OR BLEEDING  TENDERNESS IN MOUTH AND THROAT WITH OR WITHOUT PRESENCE OF ULCERS  *URINARY PROBLEMS  *BOWEL PROBLEMS  UNUSUAL RASH Items with * indicate a potential emergency and should be followed up as soon as possible.  Feel free to call the clinic you have any questions or concerns. The clinic phone number is [phone].  Please show the [place] at check-in to the Emergency Department and triage nurse.  Oxaliplatin Injection What is this medicine? OXALIPLATIN (ox AL i PLA tin) is a chemotherapy drug. It targets fast dividing cells, like cancer cells, and causes these cells to die. This medicine is used to treat cancers of the colon and rectum, and many other cancers. This medicine may be used for other purposes; ask your health care provider or pharmacist if you have questions. What should I tell my health care provider before I take this medicine? They need to know if you have any of these conditions: -kidney disease -an unusual or allergic reaction to oxaliplatin, other chemotherapy, other medicines, foods, dyes, or preservatives -pregnant or trying to get pregnant -breast-feeding How should I use this medicine? This drug is given as an infusion into a vein. It is administered in a hospital or clinic by a specially trained health care professional. Talk to your  pediatrician regarding the use of this medicine in children. Special care may be needed. Overdosage: If you think you have taken too much of this medicine contact a poison control center or emergency room at once. NOTE: This medicine is only for you. Do not share this medicine with others. What if I miss a dose? It is important not to miss a dose. Call your doctor or health care professional if you are unable to keep an appointment. What may interact with this medicine? -medicines to increase blood counts like filgrastim, pegfilgrastim, sargramostim -probenecid -some antibiotics like amikacin, gentamicin, neomycin, polymyxin B, streptomycin, tobramycin -zalcitabine Talk to your doctor or health care professional before taking any of these medicines: -acetaminophen -aspirin -ibuprofen -ketoprofen -naproxen This list may not describe all possible interactions. Give your health care provider a list of all the medicines, herbs, non-prescription drugs, or dietary supplements you use. Also tell them if you smoke, drink alcohol, or use illegal drugs. Some items may interact with your medicine. What should I watch for while using this medicine? Your condition will be monitored carefully while you are receiving this medicine. You will need important blood work done while you are taking this medicine. This medicine can make you more sensitive to cold. Do not drink cold drinks or use ice. Cover exposed skin before coming in contact with cold temperatures or cold objects. When out in cold weather wear warm clothing and cover your mouth and nose to warm the air that goes into your lungs. Tell your doctor if you get sensitive to the cold. This drug may make you feel generally unwell. This  is not uncommon, as chemotherapy can affect healthy cells as well as cancer cells. Report any side effects. Continue your course of treatment even though you feel ill unless your doctor tells you to stop. In some cases, you  may be given additional medicines to help with side effects. Follow all directions for their use. Call your doctor or health care professional for advice if you get a fever, chills or sore throat, or other symptoms of a cold or flu. Do not treat yourself. This drug decreases your body's ability to fight infections. Try to avoid being around people who are sick. This medicine may increase your risk to bruise or bleed. Call your doctor or health care professional if you notice any unusual bleeding. Be careful brushing and flossing your teeth or using a toothpick because you may get an infection or bleed more easily. If you have any dental work done, tell your dentist you are receiving this medicine. Avoid taking products that contain aspirin, acetaminophen, ibuprofen, naproxen, or ketoprofen unless instructed by your doctor. These medicines may hide a fever. Do not become pregnant while taking this medicine. Women should inform their doctor if they wish to become pregnant or think they might be pregnant. There is a potential for serious side effects to an unborn child. Talk to your health care professional or pharmacist for more information. Do not breast-feed an infant while taking this medicine. Call your doctor or health care professional if you get diarrhea. Do not treat yourself. What side effects may I notice from receiving this medicine? Side effects that you should report to your doctor or health care professional as soon as possible: -allergic reactions like skin rash, itching or hives, swelling of the face, lips, or tongue -low blood counts - This drug may decrease the number of white blood cells, red blood cells and platelets. You may be at increased risk for infections and bleeding. -signs of infection - fever or chills, cough, sore throat, pain or difficulty passing urine -signs of decreased platelets or bleeding - bruising, pinpoint red spots on the skin, black, tarry stools,  nosebleeds -signs of decreased red blood cells - unusually weak or tired, fainting spells, lightheadedness -breathing problems -chest pain, pressure -cough -diarrhea -jaw tightness -mouth sores -nausea and vomiting -pain, swelling, redness or irritation at the injection site -pain, tingling, numbness in the hands or feet -problems with balance, talking, walking -redness, blistering, peeling or loosening of the skin, including inside the mouth -trouble passing urine or change in the amount of urine Side effects that usually do not require medical attention (report to your doctor or health care professional if they continue or are bothersome): -changes in vision -constipation -hair loss -loss of appetite -metallic taste in the mouth or changes in taste -stomach pain This list may not describe all possible side effects. Call your doctor for medical advice about side effects. You may report side effects to FDA at 1-800-FDA-1088. Where should I keep my medicine? This drug is given in a hospital or clinic and will not be stored at home. NOTE: This sheet is a summary. It may not cover all possible information. If you have questions about this medicine, talk to your doctor, pharmacist, or health care provider.    2016, Elsevier/Gold Standard. (2008-01-19 17:22:47)

## 2015-05-01 ENCOUNTER — Encounter: Payer: Self-pay | Admitting: Hematology

## 2015-05-01 ENCOUNTER — Telehealth: Payer: Self-pay | Admitting: *Deleted

## 2015-05-01 NOTE — Telephone Encounter (Signed)
Called pt to f/u on chemotherapy from 04/27/15.  He reports that he has had a know in his stomach but no nausea since treatment but a little better today.  Nausea med did not help this.  He would rate this # 8 all w/e.  He has had a lot of BM-big & small but not constipated feeling or diarrhea today but this didn't relieve the knot in his stomach.  He took alka-seltzer & he had taken some ibuprofen which didn't help much.  He wants to know if this is the way it is going to be every time.  He has not received the xeloda yet but understands from [facility] Pharmacist that it should be on the way.  Note to Dr Burr Medico.  Pt has f/u appt in November & will discuss with Dr Burr Medico then.  He knows to call if symptoms worse or he has any concerns.

## 2015-05-01 NOTE — Telephone Encounter (Signed)
-----   Message from Noreene Larsson, RN sent at 04/27/2015  4:04 PM EDT ----- Regarding: [facility] [place] Follow up Pt of Dr. Burr Medico. 1st oxaliplatin. Tolerated well, had some mild tingling and numbness in his tongue about 3/4th through infusion. Improved on it's own, but still mildly there. Please follow up.   Also Xeloda has not yet been delivered, ask pt if it has been delivered.  Sincerely,  Demetrius Revel RN

## 2015-05-01 NOTE — Progress Notes (Signed)
I placed fmla forms on desk of nurse for dr. feng. °

## 2015-05-01 NOTE — Telephone Encounter (Signed)
Talked with Dr Burr Medico & she suggested zantac or prilosec OTC.  Called pt & requested he not take alka-seltzer or ibuprofen & try the zantac or prilosec daily & to call us when he receives his xeloda in case Dr Burr Medico wants to make some adjustments on dose schedule. He expressed understanding & states he will call us.

## 2015-05-01 NOTE — Progress Notes (Signed)
I faxed [place] forms to [phone]

## 2015-05-01 NOTE — Progress Notes (Signed)
I mailed the patient his copy of forms

## 2015-05-03 ENCOUNTER — Telehealth: Payer: Self-pay | Admitting: *Deleted

## 2015-05-03 NOTE — Telephone Encounter (Signed)
I called pt back, and instructed him to start Xeloda tomorrow when he receives it for 7 days, then off for 7 days. He voiced good understanding. He has some diarrhea today, epigastric discomfort has resolved, no fever or nausea, I told him to use Imodium as needed.  Truitt Merle 05/03/2015

## 2015-05-03 NOTE — Telephone Encounter (Signed)
Pt called to say he is receiving Xeloda on Thursday from pharmacy, does he need to start pills when he gets them?   Has appt with Dr Burr Medico on Monday

## 2015-05-08 ENCOUNTER — Telehealth: Payer: Self-pay | Admitting: Hematology

## 2015-05-08 ENCOUNTER — Other Ambulatory Visit (HOSPITAL_BASED_OUTPATIENT_CLINIC_OR_DEPARTMENT_OTHER): Payer: BLUE CROSS/BLUE SHIELD

## 2015-05-08 ENCOUNTER — Ambulatory Visit (HOSPITAL_BASED_OUTPATIENT_CLINIC_OR_DEPARTMENT_OTHER): Payer: BLUE CROSS/BLUE SHIELD | Admitting: Hematology

## 2015-05-08 ENCOUNTER — Encounter: Payer: Self-pay | Admitting: Hematology

## 2015-05-08 VITALS — BP 147/78 | HR 100 | Temp 98.2°F | Resp 19 | Ht 66.0 in | Wt 228.5 lb

## 2015-05-08 DIAGNOSIS — C7801 Secondary malignant neoplasm of right lung: Secondary | ICD-10-CM | POA: Diagnosis not present

## 2015-05-08 DIAGNOSIS — C7800 Secondary malignant neoplasm of unspecified lung: Principal | ICD-10-CM

## 2015-05-08 DIAGNOSIS — C187 Malignant neoplasm of sigmoid colon: Secondary | ICD-10-CM | POA: Diagnosis not present

## 2015-05-08 DIAGNOSIS — C7802 Secondary malignant neoplasm of left lung: Secondary | ICD-10-CM | POA: Diagnosis not present

## 2015-05-08 DIAGNOSIS — C189 Malignant neoplasm of colon, unspecified: Secondary | ICD-10-CM

## 2015-05-08 DIAGNOSIS — R197 Diarrhea, unspecified: Secondary | ICD-10-CM

## 2015-05-08 LAB — COMPREHENSIVE METABOLIC PANEL (CC13)
ALBUMIN: 3.5 g/dL (ref 3.5–5.0)
ALK PHOS: 92 U/L (ref 40–150)
ALT: 37 U/L (ref 0–55)
AST: 21 U/L (ref 5–34)
Anion Gap: 7 mEq/L (ref 3–11)
BUN: 8.2 mg/dL (ref 7.0–26.0)
CALCIUM: 8.9 mg/dL (ref 8.4–10.4)
CO2: 25 mEq/L (ref 22–29)
Chloride: 107 mEq/L (ref 98–109)
Creatinine: 0.8 mg/dL (ref 0.7–1.3)
EGFR: >90 mL/min/{1.73_m2} (ref >90)
Glucose: 153 mg/dl — ABNORMAL HIGH (ref 70–140)
POTASSIUM: 3.7 meq/L (ref 3.5–5.1)
Sodium: 138 mEq/L (ref 136–145)
Total Bilirubin: <0.3 mg/dL (ref 0.20–1.20)
Total Protein: 6.8 g/dL (ref 6.4–8.3)

## 2015-05-08 LAB — CBC WITH DIFFERENTIAL/PLATELET
BASO%: 0.5 % (ref 0.0–2.0)
BASOS ABS: 0 10*3/uL (ref 0.0–0.1)
EOS ABS: 0.1 10*3/uL (ref 0.0–0.5)
EOS%: 1.7 % (ref 0.0–7.0)
HEMATOCRIT: 38.7 % (ref 38.4–49.9)
HEMOGLOBIN: 12.9 g/dL — AB (ref 13.0–17.1)
LYMPH#: 1.5 10*3/uL (ref 0.9–3.3)
LYMPH%: 21.1 % (ref 14.0–49.0)
MCH: 28.3 pg (ref 27.2–33.4)
MCHC: 33.4 g/dL (ref 32.0–36.0)
MCV: 84.6 fL (ref 79.3–98.0)
MONO#: 0.7 10*3/uL (ref 0.1–0.9)
MONO%: 9.4 % (ref 0.0–14.0)
NEUT#: 4.6 10*3/uL (ref 1.5–6.5)
NEUT%: 67.3 % (ref 39.0–75.0)
Platelets: 224 10*3/uL (ref 140–400)
RBC: 4.58 10*6/uL (ref 4.20–5.82)
RDW: 13.7 % (ref 11.0–14.6)
WBC: 6.9 10*3/uL (ref 4.0–10.3)

## 2015-05-08 NOTE — Telephone Encounter (Signed)
Dr Burr Medico will see in infusion -per pof 10/21

## 2015-05-08 NOTE — Progress Notes (Signed)
[place]  Telephone:([phone]) [phone] Fax:(336) [phone]  Clinic Follow Up Note   Patient Care Team: Bill Koirala, MD as PCP - General (Family Medicine) Bill Corner, MD as Consulting Physician (Gastroenterology) Bill Confer, MD as Consulting Physician (General Surgery) Bill Merle, MD as Consulting Physician (Hematology) 05/08/2015  CHIEF COMPLAINTS:  Follow Up colon cancer  Oncology History   Colon cancer metastasized to lung [facility])   Staging form: Colon and Rectum, AJCC 7th Edition     Clinical stage from 03/30/2015: Stage Unknown ([place], N1, M1) - Unsigned       Colon cancer metastasized to lung ([place])   03/30/2015 Initial Biopsy Sigmoid mass biopsy showed invasive adenocarcinoma. Cecal colon polyps showed tubular adenoma.   03/30/2015 Initial Diagnosis Colon cancer   03/30/2015 Procedure colonoscopy by Dr. Michail Wright showed a fungating, infiltrative and ulcerated nonobstructing large mass in the sigmoid colon and at 20 cm proximal to the anus. The mass was partially circumferential no bleeding. A 10 mm polyps in the cecum was removed.   04/03/2015 Imaging CT chest, abdomen and pelvis with contrast showed nodular masslike area of clinical worsening at rectosigmoid junction, tiny pericolonic lymph nodes, bilateral pulmonary nodules measuring about 1 cm.   04/12/2015 PET scan Hypermetabolic colonic mass near rectosigmoid junction, tiny subcentimeter paracolonic lymph nodes. Bilateral pulmonary metastasis.   04/19/2015 Procedure CT-guided lung nodule biopsy attempted, unsuccessful.   04/27/2015 -  Chemotherapy Oxaliplatin 130 mg/m on day 1, Capecitabine 238m q12hr, 2 weeks on and one week off (only received 7 days for first cycle)    HISTORY OF PRESENTING ILLNESS:  Bill Dubie54y.[id number]. male is here because of recently newly diagnosed colon cancer.  He has had intermittent bloody stool for 2 years, it has been mild, mixed with stool, patient does not have any  abdominal pain, constipation, change of his bowel habits, nausea, weight loss or other symptoms. He did not seek medical attention for this. He went to emergency room on 03/15/2015 for right flank pain, due to his kidney stone. CT scan incidentally found a 11 mm right lower lobe nodule and mucosal edema in the sigmoid colon. He saw his primary care physician, and was referred to GI Bill Wright at he underwent colonoscopy on 03/30/2015, which showed a fungating infiltrative and ulcerated nonobstructing large mass in the sigmoid colon, biopsy showed adenocarcinoma. CT chest abdomen and pelvis showed multiple lung nodules measuring about 1 cm. He was referred to surgeon Dr. RZella Wright who referred patient to [place] of his lung nodule and discuss chemotherapy.  He feels very well overall, denies any symptoms. He is a m[employer] lives with his wife and 3 children. He never had screening colonoscopy prior the reason one, no significant past medical history, does not see doctors regularly.  CURRENT TREATMENT: Oxaliplatin 130 mg/m on day 1, Capecitabine 23084mq12hr, 2 weeks on and one week off (only received 7 days for first cycle)  INTERIM HISTORY EdJagereturns for follow-up. He tolerated the first cycle chemo well on 10/20, did have mild fatigue, gastric discomfort and mild diarrhea after wards, recovered well. He takes imodium for occasional diarrhea. His capecitabine did not arrive until last Friday, and he started on 05/06/2015. He has been tolerating well, has occasional warm sensation at the lower abdomen, no nausea, or other complaints. He has good appetite and eating well. He gained a few pounds in the past 2 weeks. He still works full time.    MEDICAL  HISTORY:  Past Medical History  Diagnosis Date  . Kidney calculi   . Hypercholesteremia     SURGICAL HISTORY: Past Surgical History  Procedure Laterality Date  . Colonoscopy  03/30/15    SOCIAL  HISTORY: Social History   Social History  . Marital Status: Married    Spouse Name: N/A  . Number of Children: 3, age of 62, 67 and 3   . Years of Education: N/A   Occupational History  . Banker for [employer] [place]    Social History Main Topics  . Smoking status: Never Smoker   . Smokeless tobacco: Not on file  . Alcohol Use: No  . Drug Use: No  . Sexual Activity: Not on file   Other Topics Concern  . Not on file   Social History Narrative    FAMILY HISTORY: Family History  Problem Relation Age of Onset  . Cancer Mother 3    breast cancer   . Diabetes Maternal Grandmother     ALLERGIES:  has No Known Allergies.  MEDICATIONS:  Current Outpatient Prescriptions  Medication Sig Dispense Refill  . acetaminophen (TYLENOL) 500 MG tablet Take 500 mg by mouth every 6 (six) hours as needed.    . capecitabine (XELODA) 150 MG tablet Take 1 tablet (150 mg total) by mouth 2 (two) times daily after a meal. Take on days 1-14 of chemotherapy. 28 tablet 3  . capecitabine (XELODA) 500 MG tablet Take 4 tablets (2,000 mg total) by mouth 2 (two) times daily after a meal. Take on days 1-14 of chemotherapy. 112 tablet 3  . loperamide (IMODIUM A-D) 2 MG tablet Take 4 mg by mouth daily as needed for diarrhea or loose stools.    . ranitidine (ZANTAC) 150 MG tablet Take 150 mg by mouth daily.    . ondansetron (ZOFRAN) 8 MG tablet Take 1 tablet (8 mg total) by mouth every 8 (eight) hours as needed for nausea or vomiting. (Patient not taking: Reported on 04/07/2015) 10 tablet 0  . ondansetron (ZOFRAN) 8 MG tablet Take 1 tablet (8 mg total) by mouth 2 (two) times daily. Start the day after chemo for 2 days. Then take as needed for nausea or vomiting. (Patient not taking: Reported on 05/08/2015) 30 tablet 1   No current facility-administered medications for this visit.    REVIEW OF SYSTEMS:   Constitutional: Denies fevers, chills or abnormal night sweats, no weight loss. Eyes: Denies blurriness  of vision, double vision or watery eyes Ears, nose, mouth, throat, and face: Denies mucositis or sore throat Respiratory: Denies cough, dyspnea or wheezes Cardiovascular: Denies palpitation, chest discomfort or lower extremity swelling Gastrointestinal:  Denies nausea, heartburn or change in bowel habits Skin: Denies abnormal skin rashes Lymphatics: Denies new lymphadenopathy or easy bruising Neurological:Denies numbness, tingling or new weaknesses Behavioral/Psych: Mood is stable, no new changes  All other systems were reviewed with the patient and are negative.  PHYSICAL EXAMINATION: ECOG PERFORMANCE STATUS: 0 - Asymptomatic  Filed Vitals:   05/08/15 1243  BP: 147/78  Pulse: 100  Temp: 98.2 F (36.8 C)  Resp: 19   Filed Weights   05/08/15 1243  Weight: 228 lb 8 oz (103.647 kg)    GENERAL:alert, no distress and comfortable SKIN: skin color, texture, turgor are normal, no rashes or significant lesions EYES: normal, conjunctiva are pink and non-injected, sclera clear OROPHARYNX:no exudate, no erythema and lips, buccal mucosa, and tongue normal  NECK: supple, thyroid normal size, non-tender, without nodularity LYMPH:  no palpable  lymphadenopathy in the cervical, axillary or inguinal LUNGS: clear to auscultation and percussion with normal breathing effort HEART: regular rate & rhythm and no murmurs and no lower extremity edema ABDOMEN:abdomen soft, non-tender and normal bowel sounds Musculoskeletal:no cyanosis of digits and no clubbing  PSYCH: alert & oriented x 3 with fluent speech NEURO: no focal motor/sensory deficits  LABORATORY DATA:  I have reviewed the data as listed Lab Results  Component Value Date   WBC 6.9 05/08/2015   HGB 12.9* 05/08/2015   HCT 38.7 05/08/2015   MCV 84.6 05/08/2015   PLT 224 05/08/2015    Recent Labs  03/15/15 0925 03/16/15 1710 04/19/15 1125 04/21/15 1152 04/27/15 1123 05/08/15 1213  NA 137 138 139 140 139 138  K 4.0 4.0 3.8 3.9  4.1 3.7  CL 105 103 107  --   --   --   CO2 _0 GLUCOSE 147* 149* 93 101 145* 153*  BUN _1 11.9 12.3 8.2  CREATININE 0.81 1.04 0.73 0.8 0.9 0.8  CALCIUM 9.0 9.4 9.4 9.4 9.2 8.9  GFRNONAA >60 >60 >60  --   --   --   GFRAA >60 >60 >60  --   --   --   PROT 6.6 7.2  --  7.7 6.9 6.8  ALBUMIN 3.5 3.7  --  4.1 3.7 3.5  AST 34 35  --  _2 ALT 39 45  --  34 35 37  ALKPHOS 82 81  --  91 89 92  BILITOT 0.6 0.6  --  0.40 0.32 <0.30   PATHOLOGY REPORT  Diagnosis 03/30/2015 1. Colon, polyp(s), cecal colon polyp, excision - TUBULAR ADENOMA. NO HIGH GRADE DYSPLASIA OR MALIGNANCY IDENTIFIED. 2. Colon, biopsy, sigmoid mass - INVASIVE ADENOCARCINOMA, SEE COMMENT. Microscopic Comment 2. Dr. Saralyn Pilar reviewed this case and concurs. The results were discussed with Dr. Michail Wright on 03/31/15. (BNS:ds 03/31/15)   RADIOGRAPHIC STUDIES: I have personally reviewed the radiological images as listed and agreed with the findings in the report.  Ct Chest, abdomen and pelvis W Contrast 04/03/2015    IMPRESSION: Nodular masslike area of colonic wall thickening and rectosigmoid junction, consistent with known primary colon carcinoma.  Tiny pericolonic lymph nodes in sigmoid mesocolon measuring up to 6 mm, suspicious for local metastatic disease.  Diffuse hepatic steatosis.  No liver metastases identified.  Bilateral pulmonary nodules measuring approximately 1 cm, highly suspicious for pulmonary metastases.   Electronically Signed   By: Earle Gell M.D.   On: 04/03/2015 12:57   Ct Renal Stone Study 03/15/2015    IMPRESSION: 3 mm stone proximal right ureter causing partial obstruction of the right kidney  11 mm right lower lobe nodule. Differential includes benign and malignant nodules including metastatic disease. CT chest with contrast recommended for further evaluation. Consider performing contrast-enhanced CT abdomen pelvis at the time of the chest CT to evaluate for malignancy.  Sigmoid  diverticulosis. Mucosal edema in the sigmoid colon could represent colonic neoplasm versus chronic diverticular change. This could be evaluated at CT abdomen pelvis with contrast. Endoscopy also recommended to exclude tumor.   Electronically Signed   By: Franchot Gallo M.D.   On: 03/15/2015 10:34   PET 04/12/2015 IMPRESSION: Hypermetabolic colonic mass near rectosigmoid junction, consistent with known primary colon carcinoma.  Tiny sub-cm pericolonic lymph nodes in sigmoid mesocolon are too small to characterize by PET, but are suspicious for early lymph node metastases.  Bilateral pulmonary metastases  ASSESSMENT & PLAN:  54 year old male, without significant past medical history except kidney stone, presented with intermittent bloody stool for 2 years, and colonoscopy showed a large sigmoid colon mass, CT scan showed multiple (at least 4) nodules in bilateral lungs, measuring about 1 cm.  1. Sigmoid colon adenocarcinoma, [id number], probably stage IV with lung mets  -I reviewed her colonoscopy, CT scan findings and the biopsy results in great details with patient and his wife. -I personally reviewed his CT scan image with him, he has at least 4 lung nodules in bilateral lungs, measuring about 1 cm, highly suspicious for metastatic disease.  -I reviewed his PET CT scan image with patient and his wife in person. His multiple lung nodules are PET positive with max SUV 5.8. These are most consistent with metastatic disease. Unfortunately CT-guided lung nodule biopsy was attempted but unsuccessful. -His sigmoid colon mass is not obstructed, bleeding has been minimal, she does not need urgent surgery. But we did discuss the possibility of tomorrow obstruction, bleeding, ulceration, down the road and that he may need urgent surgery in that situation. -I recommend him to start systemic chemotherapy with FOLFOX or Capeox (capecitabine 2 weeks on, and one-week off, and oxaliplatin, every 3 weeks), for  his disease control. He opted Capeox.  -His capecitabine delivery was delayed, he did not start until day 10 of his first cycle. I told him to continue for one week, then stop. Will do the second cycle chemo as it scheduled  -I requested a Foundation one test on his colon mass biopsy.If his tumor does not have RAS mutation, I'll add on EGFR inhibitor. Otherwise I'll add Avastin from cycle 2. I called path today  -If he has excellent response to systemic chemotherapy, colon surgery to remove the primary tumor is also reasonable. -we plan to repeat his CT scan in 2-3 month   2. Mild diarrhea -Secondary to chemotherapy. -He knows to use Imodium as needed  Plan He will return on 05/18/2015  for cycle 2 chemotherapy Continue Xeloda until 11/4 then stop    I spent 30 minutes counseling the patient face to face. The total time spent in the appointment was 40 minutes and more than 50% was on counseling.     Bill Merle, MD 05/08/2015 1:48 PM

## 2015-05-08 NOTE — Telephone Encounter (Signed)
LEFT MESSAGE FOR PATIENT RE NEXT APPOINTMENT FOR 11/10. SCHEDULE MAILED. PER 2ND 10/31 POF YF WILL SEE PATIENT IN INF AREA 11/10.

## 2015-05-17 ENCOUNTER — Encounter: Payer: Self-pay | Admitting: Hematology

## 2015-05-17 NOTE — Progress Notes (Signed)
I faxed emsi forms  [place]

## 2015-05-18 ENCOUNTER — Other Ambulatory Visit (HOSPITAL_BASED_OUTPATIENT_CLINIC_OR_DEPARTMENT_OTHER): Payer: BLUE CROSS/BLUE SHIELD

## 2015-05-18 ENCOUNTER — Encounter: Payer: Self-pay | Admitting: Hematology

## 2015-05-18 ENCOUNTER — Ambulatory Visit (HOSPITAL_BASED_OUTPATIENT_CLINIC_OR_DEPARTMENT_OTHER): Payer: BLUE CROSS/BLUE SHIELD | Admitting: Hematology

## 2015-05-18 ENCOUNTER — Ambulatory Visit (HOSPITAL_BASED_OUTPATIENT_CLINIC_OR_DEPARTMENT_OTHER): Payer: BLUE CROSS/BLUE SHIELD

## 2015-05-18 VITALS — BP 145/81 | HR 96 | Temp 98.8°F | Resp 18

## 2015-05-18 DIAGNOSIS — C187 Malignant neoplasm of sigmoid colon: Secondary | ICD-10-CM | POA: Diagnosis not present

## 2015-05-18 DIAGNOSIS — C7800 Secondary malignant neoplasm of unspecified lung: Principal | ICD-10-CM

## 2015-05-18 DIAGNOSIS — C7801 Secondary malignant neoplasm of right lung: Secondary | ICD-10-CM

## 2015-05-18 DIAGNOSIS — C189 Malignant neoplasm of colon, unspecified: Secondary | ICD-10-CM

## 2015-05-18 DIAGNOSIS — R1013 Epigastric pain: Secondary | ICD-10-CM | POA: Diagnosis not present

## 2015-05-18 DIAGNOSIS — C7802 Secondary malignant neoplasm of left lung: Secondary | ICD-10-CM

## 2015-05-18 DIAGNOSIS — Z5111 Encounter for antineoplastic chemotherapy: Secondary | ICD-10-CM | POA: Diagnosis not present

## 2015-05-18 DIAGNOSIS — R197 Diarrhea, unspecified: Secondary | ICD-10-CM

## 2015-05-18 LAB — CBC WITH DIFFERENTIAL/PLATELET
BASO%: 0.4 % (ref 0.0–2.0)
BASOS ABS: 0 10*3/uL (ref 0.0–0.1)
EOS%: 2.7 % (ref 0.0–7.0)
Eosinophils Absolute: 0.1 10*3/uL (ref 0.0–0.5)
HEMATOCRIT: 37.4 % — AB (ref 38.4–49.9)
HEMOGLOBIN: 12.8 g/dL — AB (ref 13.0–17.1)
LYMPH#: 1.6 10*3/uL (ref 0.9–3.3)
LYMPH%: 29.6 % (ref 14.0–49.0)
MCH: 29.4 pg (ref 27.2–33.4)
MCHC: 34.2 g/dL (ref 32.0–36.0)
MCV: 85.8 fL (ref 79.3–98.0)
MONO#: 0.6 10*3/uL (ref 0.1–0.9)
MONO%: 12 % (ref 0.0–14.0)
NEUT%: 55.3 % (ref 39.0–75.0)
NEUTROS ABS: 2.9 10*3/uL (ref 1.5–6.5)
Platelets: 211 10*3/uL (ref 140–400)
RBC: 4.36 10*6/uL (ref 4.20–5.82)
RDW: 14.2 % (ref 11.0–14.6)
WBC: 5.3 10*3/uL (ref 4.0–10.3)

## 2015-05-18 LAB — COMPREHENSIVE METABOLIC PANEL (CC13)
ALBUMIN: 3.6 g/dL (ref 3.5–5.0)
ALK PHOS: 92 U/L (ref 40–150)
ALT: 34 U/L (ref 0–55)
AST: 22 U/L (ref 5–34)
Anion Gap: 11 mEq/L (ref 3–11)
BILIRUBIN TOTAL: 0.32 mg/dL (ref 0.20–1.20)
BUN: 12.2 mg/dL (ref 7.0–26.0)
CALCIUM: 9.2 mg/dL (ref 8.4–10.4)
CO2: 23 mEq/L (ref 22–29)
Chloride: 105 mEq/L (ref 98–109)
Creatinine: 0.8 mg/dL (ref 0.7–1.3)
EGFR: >90 mL/min/{1.73_m2} (ref >90)
GLUCOSE: 150 mg/dL — AB (ref 70–140)
POTASSIUM: 3.9 meq/L (ref 3.5–5.1)
SODIUM: 138 meq/L (ref 136–145)
TOTAL PROTEIN: 6.9 g/dL (ref 6.4–8.3)

## 2015-05-18 MED ORDER — SODIUM CHLORIDE 0.9 % IV SOLN
Freq: Once | INTRAVENOUS | Status: AC
  Administered 2015-05-18: 15:00:00 via INTRAVENOUS
  Filled 2015-05-18: qty 4

## 2015-05-18 MED ORDER — OMEPRAZOLE 20 MG PO CPDR: 20.0000 mg | DELAYED_RELEASE_CAPSULE | Freq: Two times a day (BID) | ORAL | Status: DC

## 2015-05-18 MED ORDER — DEXTROSE 5 % IV SOLN
Freq: Once | INTRAVENOUS | Status: AC
Start: 2015-05-18 — End: 2015-05-18
  Administered 2015-05-18: 15:00:00 via INTRAVENOUS

## 2015-05-18 MED ORDER — HEPARIN SOD (PORK) LOCK FLUSH 100 UNIT/ML IV SOLN
500.0000 [IU] | Freq: Once | INTRAVENOUS | Status: DC | PRN
  Filled 2015-05-18: qty 5

## 2015-05-18 MED ORDER — OXALIPLATIN CHEMO INJECTION 100 MG/20ML
130.0000 mg/m2 | Freq: Once | INTRAVENOUS | Status: AC
  Administered 2015-05-18: 285 mg via INTRAVENOUS
  Filled 2015-05-18: qty 57

## 2015-05-18 MED ORDER — SODIUM CHLORIDE 0.9 % IJ SOLN
10.0000 mL | INTRAMUSCULAR | Status: DC | PRN
  Filled 2015-05-18: qty 10

## 2015-05-18 NOTE — Progress Notes (Signed)
Per Dr. Burr Medico, pt will hold off on Avastin today's treatment. Dr. Burr Medico will see pt in infusion to discuss plan of care. Pharmacy notified and is aware.   60- Dr. Burr Medico came to see pt in the treatment area and prescribed pt prilosec for gi upset with Xeloda. Will send script to pharmacy for pt to pick up after treatment today.  1715- Pt reports mild oral tingling and left hand and forearm tingling. Pt states that the oral tingling went away his first treatment. This is his 2nd treatment and had noticed that the tingling is not going away, but is tolerable. Advised pt to breath in through a warm blanket, and it had helped with comfort. Reminded pt about cold sensitivity and how to manage it after getting oxaliplatin. IV site checked located on L wrist. It is intact and has great blood return. Pt states that the tingling starts from his L hand up to his forearm. Offered to call Symptom management if pt is concern, but refused at this time. He states that he is okay and will call tomorrow if problem persist.  AVS printed and will need contacted for future appts.

## 2015-05-18 NOTE — Patient Instructions (Signed)
[facility] Discharge Instructions for Patients Receiving Chemotherapy  Today you received the following chemotherapy agents oxaliplatin  To help prevent nausea and vomiting after your treatment, we encourage you to take your nausea medication    If you develop nausea and vomiting that is not controlled by your nausea medication, call the clinic.   BELOW ARE SYMPTOMS THAT SHOULD BE REPORTED IMMEDIATELY:  *FEVER GREATER THAN 100.5 F  *CHILLS WITH OR WITHOUT FEVER  NAUSEA AND VOMITING THAT IS NOT CONTROLLED WITH YOUR NAUSEA MEDICATION  *UNUSUAL SHORTNESS OF BREATH  *UNUSUAL BRUISING OR BLEEDING  TENDERNESS IN MOUTH AND THROAT WITH OR WITHOUT PRESENCE OF ULCERS  *URINARY PROBLEMS  *BOWEL PROBLEMS  UNUSUAL RASH Items with * indicate a potential emergency and should be followed up as soon as possible.  Feel free to call the clinic you have any questions or concerns. The clinic phone number is [phone].  Please show the CHEMO ALERT CARD at check-in to the Emergency Department and triage nurse.  Oxaliplatin Injection What is this medicine? OXALIPLATIN (ox AL i PLA tin) is a chemotherapy drug. It targets fast dividing cells, like cancer cells, and causes these cells to die. This medicine is used to treat cancers of the colon and rectum, and many other cancers. This medicine may be used for other purposes; ask your health care provider or pharmacist if you have questions. What should I tell my health care provider before I take this medicine? They need to know if you have any of these conditions: -kidney disease -an unusual or allergic reaction to oxaliplatin, other chemotherapy, other medicines, foods, dyes, or preservatives -pregnant or trying to get pregnant -breast-feeding How should I use this medicine? This drug is given as an infusion into a vein. It is administered in a hospital or clinic by a specially trained health care professional. Talk to your  pediatrician regarding the use of this medicine in children. Special care may be needed. Overdosage: If you think you have taken too much of this medicine contact a poison control center or emergency room at once. NOTE: This medicine is only for you. Do not share this medicine with others. What if I miss a dose? It is important not to miss a dose. Call your doctor or health care professional if you are unable to keep an appointment. What may interact with this medicine? -medicines to increase blood counts like filgrastim, pegfilgrastim, sargramostim -probenecid -some antibiotics like amikacin, gentamicin, neomycin, polymyxin B, streptomycin, tobramycin -zalcitabine Talk to your doctor or health care professional before taking any of these medicines: -acetaminophen -aspirin -ibuprofen -ketoprofen -naproxen This list may not describe all possible interactions. Give your health care provider a list of all the medicines, herbs, non-prescription drugs, or dietary supplements you use. Also tell them if you smoke, drink alcohol, or use illegal drugs. Some items may interact with your medicine. What should I watch for while using this medicine? Your condition will be monitored carefully while you are receiving this medicine. You will need important blood work done while you are taking this medicine. This medicine can make you more sensitive to cold. Do not drink cold drinks or use ice. Cover exposed skin before coming in contact with cold temperatures or cold objects. When out in cold weather wear warm clothing and cover your mouth and nose to warm the air that goes into your lungs. Tell your doctor if you get sensitive to the cold. This drug may make you feel generally unwell. This   is not uncommon, as chemotherapy can affect healthy cells as well as cancer cells. Report any side effects. Continue your course of treatment even though you feel ill unless your doctor tells you to stop. In some cases, you  may be given additional medicines to help with side effects. Follow all directions for their use. Call your doctor or health care professional for advice if you get a fever, chills or sore throat, or other symptoms of a cold or flu. Do not treat yourself. This drug decreases your body's ability to fight infections. Try to avoid being around people who are sick. This medicine may increase your risk to bruise or bleed. Call your doctor or health care professional if you notice any unusual bleeding. Be careful brushing and flossing your teeth or using a toothpick because you may get an infection or bleed more easily. If you have any dental work done, tell your dentist you are receiving this medicine. Avoid taking products that contain aspirin, acetaminophen, ibuprofen, naproxen, or ketoprofen unless instructed by your doctor. These medicines may hide a fever. Do not become pregnant while taking this medicine. Women should inform their doctor if they wish to become pregnant or think they might be pregnant. There is a potential for serious side effects to an unborn child. Talk to your health care professional or pharmacist for more information. Do not breast-feed an infant while taking this medicine. Call your doctor or health care professional if you get diarrhea. Do not treat yourself. What side effects may I notice from receiving this medicine? Side effects that you should report to your doctor or health care professional as soon as possible: -allergic reactions like skin rash, itching or hives, swelling of the face, lips, or tongue -low blood counts - This drug may decrease the number of white blood cells, red blood cells and platelets. You may be at increased risk for infections and bleeding. -signs of infection - fever or chills, cough, sore throat, pain or difficulty passing urine -signs of decreased platelets or bleeding - bruising, pinpoint red spots on the skin, black, tarry stools,  nosebleeds -signs of decreased red blood cells - unusually weak or tired, fainting spells, lightheadedness -breathing problems -chest pain, pressure -cough -diarrhea -jaw tightness -mouth sores -nausea and vomiting -pain, swelling, redness or irritation at the injection site -pain, tingling, numbness in the hands or feet -problems with balance, talking, walking -redness, blistering, peeling or loosening of the skin, including inside the mouth -trouble passing urine or change in the amount of urine Side effects that usually do not require medical attention (report to your doctor or health care professional if they continue or are bothersome): -changes in vision -constipation -hair loss -loss of appetite -metallic taste in the mouth or changes in taste -stomach pain This list may not describe all possible side effects. Call your doctor for medical advice about side effects. You may report side effects to FDA at 1-800-FDA-1088. Where should I keep my medicine? This drug is given in a hospital or clinic and will not be stored at home. NOTE: This sheet is a summary. It may not cover all possible information. If you have questions about this medicine, talk to your doctor, pharmacist, or health care provider.    2016, Elsevier/Gold Standard. (2008-01-19 17:22:47)  

## 2015-05-18 NOTE — Progress Notes (Signed)
[place]  Telephone:([phone]) [phone] Fax:(336) [phone]  Clinic Follow Up Note   Patient Care Team: Dibas Koirala, MD as PCP - General (Family Medicine) Wilford Corner, MD as Consulting Physician (Gastroenterology) Jackolyn Confer, MD as Consulting Physician (General Surgery) Truitt Merle, MD as Consulting Physician (Hematology) 05/18/2015  CHIEF COMPLAINTS:  Follow Up colon cancer  Oncology History   Colon cancer metastasized to lung [facility])   Staging form: Colon and Rectum, AJCC 7th Edition     Clinical stage from 03/30/2015: Stage Unknown ([place], N1, M1) - Unsigned       Colon cancer metastasized to lung ([place])   03/30/2015 Initial Biopsy Sigmoid mass biopsy showed invasive adenocarcinoma. Cecal colon polyps showed tubular adenoma.   03/30/2015 Initial Diagnosis Colon cancer   03/30/2015 Procedure colonoscopy by Dr. Michail Sermon showed a fungating, infiltrative and ulcerated nonobstructing large mass in the sigmoid colon and at 20 cm proximal to the anus. The mass was partially circumferential no bleeding. A 10 mm polyps in the cecum was removed.   04/03/2015 Imaging CT chest, abdomen and pelvis with contrast showed nodular masslike area of clinical worsening at rectosigmoid junction, tiny pericolonic lymph nodes, bilateral pulmonary nodules measuring about 1 cm.   04/12/2015 PET scan Hypermetabolic colonic mass near rectosigmoid junction, tiny subcentimeter paracolonic lymph nodes. Bilateral pulmonary metastasis.   04/19/2015 Procedure CT-guided lung nodule biopsy attempted, unsuccessful.   04/27/2015 -  Chemotherapy Oxaliplatin 130 mg/m on day 1, Capecitabine 2368m q12hr, 2 weeks on and one week off (only received 7 days for first cycle)    HISTORY OF PRESENTING ILLNESS:  ESukhman Martine54y.o. male is here because of recently newly diagnosed colon cancer.  He has had intermittent bloody stool for 2 years, it has been mild, mixed with stool, patient does not have any  abdominal pain, constipation, change of his bowel habits, nausea, weight loss or other symptoms. He did not seek medical attention for this. He went to emergency room on 03/15/2015 for right flank pain, due to his kidney stone. CT scan incidentally found a 11 mm right lower lobe nodule and mucosal edema in the sigmoid colon. He saw his primary care physician, and was referred to GI Dr. SMichail Sermonhere at he underwent colonoscopy on 03/30/2015, which showed a fungating infiltrative and ulcerated nonobstructing large mass in the sigmoid colon, biopsy showed adenocarcinoma. CT chest abdomen and pelvis showed multiple lung nodules measuring about 1 cm. He was referred to surgeon Dr. RZella Richer who referred patient to [place] further [place] of his lung nodule and discuss chemotherapy.  He feels very well overall, denies any symptoms. He is a [employer] lives with his wife and 3 children. He never had screening colonoscopy prior the reason one, no significant past medical history, does not see doctors regularly.  CURRENT TREATMENT: Oxaliplatin 130 mg/m on day 1, Capecitabine 23083mq12hr, 2 weeks on and one week off (only received 7 days for first cycle)  INTERIM HISTORY EdNyheemeturns for follow-up and second cycle chemo. He had moderate epigastric discomfort with Xeloda, which was persistent, no associated nausea, diarrhea or other GI symptoms. She tried Zantac and Tums which did not help. The symptom resolved after he stopped Xeloda one-week ago. He otherwise doing well, no other complaints. He works full-time. His appetite and energy level are good. He gained 6 lbs in the past 3 weeks.    MEDICAL HISTORY:  Past Medical History  Diagnosis Date  . Kidney calculi   .  Hypercholesteremia     SURGICAL HISTORY: Past Surgical History  Procedure Laterality Date  . Colonoscopy  03/30/15    SOCIAL HISTORY: Social History   Social History  . Marital Status: Married    Spouse Name: N/A    . Number of Children: 3, age of 53, 50 and 33   . Years of Education: N/A   Occupational History  . Banker for [employer] [place]    Social History Main Topics  . Smoking status: Never Smoker   . Smokeless tobacco: Not on file  . Alcohol Use: No  . Drug Use: No  . Sexual Activity: Not on file   Other Topics Concern  . Not on file   Social History Narrative    FAMILY HISTORY: Family History  Problem Relation Age of Onset  . Cancer Mother 22    breast cancer   . Diabetes Maternal Grandmother     ALLERGIES:  has No Known Allergies.  MEDICATIONS:  Current Outpatient Prescriptions  Medication Sig Dispense Refill  . acetaminophen (TYLENOL) 500 MG tablet Take 500 mg by mouth every 6 (six) hours as needed.    . capecitabine (XELODA) 150 MG tablet Take 1 tablet (150 mg total) by mouth 2 (two) times daily after a meal. Take on days 1-14 of chemotherapy. 28 tablet 3  . capecitabine (XELODA) 500 MG tablet Take 4 tablets (2,000 mg total) by mouth 2 (two) times daily after a meal. Take on days 1-14 of chemotherapy. 112 tablet 3  . loperamide (IMODIUM A-D) 2 MG tablet Take 4 mg by mouth daily as needed for diarrhea or loose stools.    . ondansetron (ZOFRAN) 8 MG tablet Take 1 tablet (8 mg total) by mouth every 8 (eight) hours as needed for nausea or vomiting. (Patient not taking: Reported on 04/07/2015) 10 tablet 0  . ondansetron (ZOFRAN) 8 MG tablet Take 1 tablet (8 mg total) by mouth 2 (two) times daily. Start the day after chemo for 2 days. Then take as needed for nausea or vomiting. (Patient not taking: Reported on 05/08/2015) 30 tablet 1  . ranitidine (ZANTAC) 150 MG tablet Take 150 mg by mouth daily.     No current facility-administered medications for this visit.    REVIEW OF SYSTEMS:   Constitutional: Denies fevers, chills or abnormal night sweats, no weight loss. Eyes: Denies blurriness of vision, double vision or watery eyes Ears, nose, mouth, throat, and face: Denies mucositis  or sore throat Respiratory: Denies cough, dyspnea or wheezes Cardiovascular: Denies palpitation, chest discomfort or lower extremity swelling Gastrointestinal:  Denies nausea, heartburn or change in bowel habits Skin: Denies abnormal skin rashes Lymphatics: Denies new lymphadenopathy or easy bruising Neurological:Denies numbness, tingling or new weaknesses Behavioral/Psych: Mood is stable, no new changes  All other systems were reviewed with the patient and are negative.  PHYSICAL EXAMINATION: ECOG PERFORMANCE STATUS: 0 - Asymptomatic Blood pressure 147/78, heart rate 100, respiratory rate 19, temperature 98.2, pulse ox 99% on room air GENERAL:alert, no distress and comfortable SKIN: skin color, texture, turgor are normal, no rashes or significant lesions EYES: normal, conjunctiva are pink and non-injected, sclera clear OROPHARYNX:no exudate, no erythema and lips, buccal mucosa, and tongue normal  NECK: supple, thyroid normal size, non-tender, without nodularity LYMPH:  no palpable lymphadenopathy in the cervical, axillary or inguinal LUNGS: clear to auscultation and percussion with normal breathing effort HEART: regular rate & rhythm and no murmurs and no lower extremity edema ABDOMEN:abdomen soft, non-tender and normal bowel sounds Musculoskeletal:no  cyanosis of digits and no clubbing  PSYCH: alert & oriented x 3 with fluent speech NEURO: no focal motor/sensory deficits  LABORATORY DATA:  I have reviewed the data as listed Lab Results  Component Value Date   WBC 6.9 05/08/2015   HGB 12.9* 05/08/2015   HCT 38.7 05/08/2015   MCV 84.6 05/08/2015   PLT 224 05/08/2015    Recent Labs  03/15/15 0925 03/16/15 1710 04/19/15 1125 04/21/15 1152 04/27/15 1123 05/08/15 1213  NA 137 138 139 140 139 138  K 4.0 4.0 3.8 3.9 4.1 3.7  CL 105 103 107  --   --   --   CO2 _0 GLUCOSE 147* 149* 93 101 145* 153*  BUN _1 11.9 12.3 8.2  CREATININE 0.81 1.04 0.73 0.8 0.9  0.8  CALCIUM 9.0 9.4 9.4 9.4 9.2 8.9  GFRNONAA >60 >60 >60  --   --   --   GFRAA >60 >60 >60  --   --   --   PROT 6.6 7.2  --  7.7 6.9 6.8  ALBUMIN 3.5 3.7  --  4.1 3.7 3.5  AST 34 35  --  _2 ALT 39 45  --  34 35 37  ALKPHOS 82 81  --  91 89 92  BILITOT 0.6 0.6  --  0.40 0.32 <0.30   PATHOLOGY REPORT  Diagnosis 03/30/2015 1. Colon, polyp(s), cecal colon polyp, excision - TUBULAR ADENOMA. NO HIGH GRADE DYSPLASIA OR MALIGNANCY IDENTIFIED. 2. Colon, biopsy, sigmoid mass - INVASIVE ADENOCARCINOMA, SEE COMMENT. Microscopic Comment 2. Dr. Saralyn Pilar reviewed this case and concurs. The results were discussed with Dr. Michail Sermon on 03/31/15. (BNS:ds 03/31/15)   RADIOGRAPHIC STUDIES: I have personally reviewed the radiological images as listed and agreed with the findings in the report.  Ct Chest, abdomen and pelvis W Contrast 04/03/2015    IMPRESSION: Nodular masslike area of colonic wall thickening and rectosigmoid junction, consistent with known primary colon carcinoma.  Tiny pericolonic lymph nodes in sigmoid mesocolon measuring up to 6 mm, suspicious for local metastatic disease.  Diffuse hepatic steatosis.  No liver metastases identified.  Bilateral pulmonary nodules measuring approximately 1 cm, highly suspicious for pulmonary metastases.   Electronically Signed   By: Earle Gell M.D.   On: 04/03/2015 12:57   Ct Renal Stone Study 03/15/2015    IMPRESSION: 3 mm stone proximal right ureter causing partial obstruction of the right kidney  11 mm right lower lobe nodule. Differential includes benign and malignant nodules including metastatic disease. CT chest with contrast recommended for further evaluation. Consider performing contrast-enhanced CT abdomen pelvis at the time of the chest CT to evaluate for malignancy.  Sigmoid diverticulosis. Mucosal edema in the sigmoid colon could represent colonic neoplasm versus chronic diverticular change. This could be evaluated at CT abdomen pelvis with  contrast. Endoscopy also recommended to exclude tumor.   Electronically Signed   By: Franchot Gallo M.D.   On: 03/15/2015 10:34   PET 04/12/2015 IMPRESSION: Hypermetabolic colonic mass near rectosigmoid junction, consistent with known primary colon carcinoma.  Tiny sub-cm pericolonic lymph nodes in sigmoid mesocolon are too small to characterize by PET, but are suspicious for early lymph node metastases.  Bilateral pulmonary metastases    ASSESSMENT & PLAN:  54 year old male, without significant past medical history except kidney stone, presented with intermittent bloody stool for 2 years, and colonoscopy showed a large sigmoid colon mass, CT scan showed multiple (at least  4) nodules in bilateral lungs, measuring about 1 cm.  1. Sigmoid colon adenocarcinoma, [id number], probably stage IV with lung mets  -I reviewed his colonoscopy, CT scan findings and the biopsy results in great details with patient and his wife. -I personally reviewed his CT scan image with him, he has at least 4 lung nodules in bilateral lungs, measuring about 1 cm, highly suspicious for metastatic disease.  -I reviewed his PET CT scan image with patient and his wife in person. His multiple lung nodules are PET positive with max SUV 5.8. These are most consistent with metastatic disease. Unfortunately CT-guided lung nodule biopsy was attempted but unsuccessful. -His sigmoid colon mass is not obstructed, bleeding has been minimal, she does not need urgent surgery. But we did discuss the possibility of tomorrow obstruction, bleeding, ulceration, down the road and that he may need urgent surgery in that situation. -I recommend him to start systemic chemotherapy with FOLFOX or Capeox (capecitabine 2 weeks on, and one-week off, and oxaliplatin, every 3 weeks), for his disease control. He opted Capeox.  -I requested a Foundation one test on his colon mass biopsy.If his tumor does not have RAS mutation, I'll add on EGFR inhibitor.  Otherwise I'll add Avastin from cycle 3. I called path today and his result will likely return next week  -If he has excellent response to systemic chemotherapy, colon surgery to remove the primary tumor is also reasonable. -we plan to repeat his CT scan in 2-3 month  -laboratory reviewed, adequate for treatment, we'll proceed with second cycle CAPEOX today   2. Gastric pain -secondary to Xeloda -I sent a prescription of Prilosec 59m twice a day for him today -I told him to avoid acid food and drinks, no fried or spicy food  3. Mild diarrhea -Secondary to chemotherapy. -He knows to use Imodium as needed  Plan -second cycle Capeox today - add Prilosec 20 mg twice a day -I will see him back in 3 weeks for next cycle, will add avastin or EGFR inhibitor with next cycle, based on the Foundation One result   I spent 20 minutes counseling the patient face to face. The total time spent in the appointment was 30 minutes and more than 50% was on counseling.     FTruitt Merle MD 05/18/2015 8:26 AM

## 2015-05-19 ENCOUNTER — Telehealth: Payer: Self-pay | Admitting: Hematology

## 2015-05-19 DIAGNOSIS — R1013 Epigastric pain: Secondary | ICD-10-CM | POA: Insufficient documentation

## 2015-05-19 LAB — CEA: CEA: <0.5 ng/mL (ref 0.0–5.0)

## 2015-05-19 NOTE — Telephone Encounter (Signed)
S/w pt, gave appt 12/1 @ 12.45pm.

## 2015-05-23 ENCOUNTER — Encounter: Payer: Self-pay | Admitting: Hematology

## 2015-05-23 NOTE — Progress Notes (Signed)
I faxed again emsi form  [place]

## 2015-06-05 ENCOUNTER — Encounter (HOSPITAL_COMMUNITY): Payer: Self-pay

## 2015-06-08 ENCOUNTER — Ambulatory Visit (HOSPITAL_BASED_OUTPATIENT_CLINIC_OR_DEPARTMENT_OTHER): Payer: BLUE CROSS/BLUE SHIELD | Admitting: Hematology

## 2015-06-08 ENCOUNTER — Ambulatory Visit (HOSPITAL_BASED_OUTPATIENT_CLINIC_OR_DEPARTMENT_OTHER): Payer: BLUE CROSS/BLUE SHIELD

## 2015-06-08 ENCOUNTER — Telehealth: Payer: Self-pay | Admitting: Hematology

## 2015-06-08 ENCOUNTER — Other Ambulatory Visit: Payer: BLUE CROSS/BLUE SHIELD

## 2015-06-08 ENCOUNTER — Other Ambulatory Visit (HOSPITAL_BASED_OUTPATIENT_CLINIC_OR_DEPARTMENT_OTHER): Payer: BLUE CROSS/BLUE SHIELD

## 2015-06-08 ENCOUNTER — Encounter: Payer: Self-pay | Admitting: Hematology

## 2015-06-08 ENCOUNTER — Telehealth: Payer: Self-pay | Admitting: *Deleted

## 2015-06-08 VITALS — BP 149/70 | HR 95 | Temp 98.1°F | Resp 20 | Ht 66.0 in | Wt 222.6 lb

## 2015-06-08 DIAGNOSIS — C189 Malignant neoplasm of colon, unspecified: Secondary | ICD-10-CM

## 2015-06-08 DIAGNOSIS — C187 Malignant neoplasm of sigmoid colon: Secondary | ICD-10-CM | POA: Diagnosis not present

## 2015-06-08 DIAGNOSIS — C7802 Secondary malignant neoplasm of left lung: Secondary | ICD-10-CM | POA: Diagnosis not present

## 2015-06-08 DIAGNOSIS — C7800 Secondary malignant neoplasm of unspecified lung: Principal | ICD-10-CM

## 2015-06-08 DIAGNOSIS — Z5111 Encounter for antineoplastic chemotherapy: Secondary | ICD-10-CM

## 2015-06-08 DIAGNOSIS — C7801 Secondary malignant neoplasm of right lung: Secondary | ICD-10-CM

## 2015-06-08 DIAGNOSIS — R109 Unspecified abdominal pain: Secondary | ICD-10-CM | POA: Diagnosis not present

## 2015-06-08 DIAGNOSIS — R197 Diarrhea, unspecified: Secondary | ICD-10-CM

## 2015-06-08 LAB — COMPREHENSIVE METABOLIC PANEL (CC13)
ALT: 34 U/L (ref 0–55)
ANION GAP: 9 meq/L (ref 3–11)
AST: 27 U/L (ref 5–34)
Albumin: 3.7 g/dL (ref 3.5–5.0)
Alkaline Phosphatase: 87 U/L (ref 40–150)
BILIRUBIN TOTAL: 0.54 mg/dL (ref 0.20–1.20)
BUN: 8.1 mg/dL (ref 7.0–26.0)
CHLORIDE: 106 meq/L (ref 98–109)
CO2: 25 meq/L (ref 22–29)
Calcium: 9.4 mg/dL (ref 8.4–10.4)
Creatinine: 0.9 mg/dL (ref 0.7–1.3)
Glucose: 114 mg/dl (ref 70–140)
Potassium: 3.9 mEq/L (ref 3.5–5.1)
Sodium: 139 mEq/L (ref 136–145)
Total Protein: 7.3 g/dL (ref 6.4–8.3)

## 2015-06-08 LAB — CBC WITH DIFFERENTIAL/PLATELET
BASO%: 0.7 % (ref 0.0–2.0)
BASOS ABS: 0 10*3/uL (ref 0.0–0.1)
EOS ABS: 0.1 10*3/uL (ref 0.0–0.5)
EOS%: 1.6 % (ref 0.0–7.0)
HCT: 38 % — ABNORMAL LOW (ref 38.4–49.9)
HGB: 12.8 g/dL — ABNORMAL LOW (ref 13.0–17.1)
LYMPH%: 27.8 % (ref 14.0–49.0)
MCH: 28.8 pg (ref 27.2–33.4)
MCHC: 33.5 g/dL (ref 32.0–36.0)
MCV: 85.8 fL (ref 79.3–98.0)
MONO#: 0.6 10*3/uL (ref 0.1–0.9)
MONO%: 11.5 % (ref 0.0–14.0)
NEUT#: 2.9 10*3/uL (ref 1.5–6.5)
NEUT%: 58.4 % (ref 39.0–75.0)
PLATELETS: 211 10*3/uL (ref 140–400)
RBC: 4.43 10*6/uL (ref 4.20–5.82)
RDW: 14.7 % — ABNORMAL HIGH (ref 11.0–14.6)
WBC: 5 10*3/uL (ref 4.0–10.3)
lymph#: 1.4 10*3/uL (ref 0.9–3.3)

## 2015-06-08 MED ORDER — SODIUM CHLORIDE 0.9 % IV SOLN
Freq: Once | INTRAVENOUS | Status: AC
  Administered 2015-06-08: 15:00:00 via INTRAVENOUS
  Filled 2015-06-08: qty 4

## 2015-06-08 MED ORDER — OXALIPLATIN CHEMO INJECTION 100 MG/20ML
130.0000 mg/m2 | Freq: Once | INTRAVENOUS | Status: AC
  Administered 2015-06-08: 285 mg via INTRAVENOUS
  Filled 2015-06-08: qty 57

## 2015-06-08 MED ORDER — DEXTROSE 5 % IV SOLN
Freq: Once | INTRAVENOUS | Status: AC
  Administered 2015-06-08: 15:00:00 via INTRAVENOUS

## 2015-06-08 NOTE — Telephone Encounter (Signed)
Per staff message and POF I have scheduled appts. Advised scheduler of appts. JMW  

## 2015-06-08 NOTE — Telephone Encounter (Signed)
per pof to sch pt appt-sent MW email to sch trmt-pt to get updated copy of avs b4 leavin

## 2015-06-08 NOTE — Progress Notes (Signed)
[facility]  [phone] [phone]  Clinic Follow Up Note   Patient Care Team: Dibas Koirala, MD as PCP - General (Family Medicine) Wilford Corner, MD as Consulting Physician (Gastroenterology) Jackolyn Confer, MD as Consulting Physician (General Surgery) Truitt Merle, MD as Consulting Physician (Hematology) 06/08/2015  CHIEF COMPLAINTS:  Follow Up colon cancer  Oncology History   Colon cancer metastasized to lung [facility])   Staging form: Colon and Rectum, AJCC 7th Edition     Clinical stage from 03/30/2015: Stage Unknown ([place], N1, M1) - Unsigned       Colon cancer metastasized to lung ([place])   03/30/2015 Initial Biopsy Sigmoid mass biopsy showed invasive adenocarcinoma. Cecal colon polyps showed tubular adenoma.   03/30/2015 Initial Diagnosis Colon cancer   03/30/2015 Procedure colonoscopy by Dr. Michail Sermon showed a fungating, infiltrative and ulcerated nonobstructing large mass in the sigmoid colon and at 20 cm proximal to the anus. The mass was partially circumferential no bleeding. A 10 mm polyps in the cecum was removed.   04/03/2015 Imaging CT chest, abdomen and pelvis with contrast showed nodular masslike area of clinical worsening at rectosigmoid junction, tiny pericolonic lymph nodes, bilateral pulmonary nodules measuring about 1 cm.   04/12/2015 PET scan Hypermetabolic colonic mass near rectosigmoid junction, tiny subcentimeter paracolonic lymph nodes. Bilateral pulmonary metastasis.   04/19/2015 Procedure CT-guided lung nodule biopsy attempted, unsuccessful.   04/27/2015 -  Chemotherapy Oxaliplatin 130 mg/m on day 1, Capecitabine 233m q12hr, 2 weeks on and one week off (only received 7 days for first cycle)    HISTORY OF PRESENTING ILLNESS:  Bill Rama54  y.o. male is here because of recently newly diagnosed colon cancer.  He has had intermittent bloody stool for 2 years, it has been mild, mixed with stool, patient does not have any  abdominal pain, constipation, change of his bowel habits, nausea, weight loss or other symptoms. He did not seek medical attention for this. He went to emergency room on 03/15/2015 for right flank pain, due to his kidney stone. CT scan incidentally found a 11 mm right lower lobe nodule and mucosal edema in the sigmoid colon. He saw his primary care physician, and was referred to GI Dr. SMichail Sermonhere at he underwent colonoscopy on 03/30/2015, which showed a fungating infiltrative and ulcerated nonobstructing large mass in the sigmoid colon, biopsy showed adenocarcinoma. CT chest abdomen and pelvis showed multiple lung nodules measuring about 1 cm. He was referred to surgeon Dr. RZella Richer who referred patient to [place] of his lung nodule and discuss chemotherapy.  He feels very well overall, denies any symptoms. He is a [employer] lives with his wife and 3 children. He never had screening colonoscopy prior the reason one, no significant past medical history, does not see doctors regularly.  CURRENT TREATMENT: Oxaliplatin 130 mg/m on day 1, Capecitabine 23043mq12hr, D1-14, every 21 days, will add Panitumumab every 2 weeks from cycle 4  INTERIM HISTORY [place] for follow-up and third cycle chemo. He tolerated the second cycle full dose chemotherapy well overall. His only complaint is mild to moderate gastric discomfort, which was consistent, improved by taking Prilosec. It resolved after he completed the two-week Capecitabine. He had mild diarrhea, also resolved, no significant nausea, vomiting, change of appetite, or other complaints. He continues working full-time, has good energy level, no fever or chill or other complaints. He lost about 7-8 pounds when he was taking the chemotherapy pill, and gained 3 pounds back  last week.   MEDICAL HISTORY:  Past Medical History  Diagnosis Date  . Kidney calculi   . Hypercholesteremia     SURGICAL HISTORY: Past Surgical  History  Procedure Laterality Date  . Colonoscopy  03/30/15    SOCIAL HISTORY: Social History   Social History  . Marital Status: Married    Spouse Name: N/A  . Number of Children: 3, age of 53, 64 and 37   . Years of Education: N/A   Occupational History  . Banker for [employer] [place]    Social History Main Topics  . Smoking status: Never Smoker   . Smokeless tobacco: Not on file  . Alcohol Use: No  . Drug Use: No  . Sexual Activity: Not on file   Other Topics Concern  . Not on file   Social History Narrative    FAMILY HISTORY: Family History  Problem Relation Age of Onset  . Cancer Mother 16    breast cancer   . Diabetes Maternal Grandmother     ALLERGIES:  has No Known Allergies.  MEDICATIONS:  Current Outpatient Prescriptions  Medication Sig Dispense Refill  . acetaminophen (TYLENOL) 500 MG tablet Take 500 mg by mouth every 6 (six) hours as needed.    . capecitabine (XELODA) 150 MG tablet Take 1 tablet (150 mg total) by mouth 2 (two) times daily after a meal. Take on days 1-14 of chemotherapy. 28 tablet 3  . capecitabine (XELODA) 500 MG tablet Take 4 tablets (2,000 mg total) by mouth 2 (two) times daily after a meal. Take on days 1-14 of chemotherapy. 112 tablet 3  . loperamide (IMODIUM A-D) 2 MG tablet Take 4 mg by mouth daily as needed for diarrhea or loose stools.    Marland Kitchen omeprazole (PRILOSEC) 20 MG capsule Take 1 capsule (20 mg total) by mouth 2 (two) times daily before a meal. 60 capsule 3  . ondansetron (ZOFRAN) 8 MG tablet Take 1 tablet (8 mg total) by mouth every 8 (eight) hours as needed for nausea or vomiting. (Patient not taking: Reported on 04/07/2015) 10 tablet 0  . ondansetron (ZOFRAN) 8 MG tablet Take 1 tablet (8 mg total) by mouth 2 (two) times daily. Start the day after chemo for 2 days. Then take as needed for nausea or vomiting. (Patient not taking: Reported on 05/08/2015) 30 tablet 1  . ranitidine (ZANTAC) 150 MG tablet Take 150 mg by mouth daily.      No current facility-administered medications for this visit.    REVIEW OF SYSTEMS:   Constitutional: Denies fevers, chills or abnormal night sweats, no weight loss. Eyes: Denies blurriness of vision, double vision or watery eyes Ears, nose, mouth, throat, and face: Denies mucositis or sore throat Respiratory: Denies cough, dyspnea or wheezes Cardiovascular: Denies palpitation, chest discomfort or lower extremity swelling Gastrointestinal:  Denies nausea, heartburn or change in bowel habits Skin: Denies abnormal skin rashes Lymphatics: Denies new lymphadenopathy or easy bruising Neurological:Denies numbness, tingling or new weaknesses Behavioral/Psych: Mood is stable, no new changes  All other systems were reviewed with the patient and are negative.  PHYSICAL EXAMINATION: ECOG PERFORMANCE STATUS: 0 - Asymptomatic BP 149/70 mmHg  Pulse 95  Temp(Src) 98.1 F (36.7 C) (Oral)  Resp 20  Ht 5' 6"  (1.676 m)  Wt 222 lb 9.6 oz (100.971 kg)  BMI 35.95 kg/m2  SpO2 100% GENERAL:alert, no distress and comfortable SKIN: skin color, texture, turgor are normal, no rashes or significant lesions EYES: normal, conjunctiva are pink and non-injected,  sclera clear OROPHARYNX:no exudate, no erythema and lips, buccal mucosa, and tongue normal  NECK: supple, thyroid normal size, non-tender, without nodularity LYMPH:  no palpable lymphadenopathy in the cervical, axillary or inguinal LUNGS: clear to auscultation and percussion with normal breathing effort HEART: regular rate & rhythm and no murmurs and no lower extremity edema ABDOMEN:abdomen soft, non-tender and normal bowel sounds Musculoskeletal:no cyanosis of digits and no clubbing  PSYCH: alert & oriented x 3 with fluent speech NEURO: no focal motor/sensory deficits  LABORATORY DATA:  I have reviewed the data as listed CBC Latest Ref Rng 06/08/2015 05/18/2015 05/08/2015  WBC 4.0 - 10.3 10e3/uL 5.0 5.3 6.9  Hemoglobin 13.0 - 17.1 g/dL 12.8(L)  12.8(L) 12.9(L)  Hematocrit 38.4 - 49.9 % 38.0(L) 37.4(L) 38.7  Platelets 140 - 400 10e3/uL 211 211 224     Recent Labs  03/15/15 0925 03/16/15 1710 04/19/15 1125  04/27/15 1123 05/08/15 1213 05/18/15 1303  NA 137 138 139  < > 139 138 138  K 4.0 4.0 3.8  < > 4.1 3.7 3.9  CL 105 103 107  --   --   --   --   CO2 24 26 24   < > 25 25 23   GLUCOSE 147* 149* 93  < > 145* 153* 150*  BUN 7 14 12   < > 12.3 8.2 12.2  CREATININE 0.81 1.04 0.73  < > 0.9 0.8 0.8  CALCIUM 9.0 9.4 9.4  < > 9.2 8.9 9.2  GFRNONAA >60 >60 >60  --   --   --   --   GFRAA >60 >60 >60  --   --   --   --   PROT 6.6 7.2  --   < > 6.9 6.8 6.9  ALBUMIN 3.5 3.7  --   < > 3.7 3.5 3.6  AST 34 35  --   < > 19 21 22   ALT 39 45  --   < > 35 37 34  ALKPHOS 82 81  --   < > 89 92 92  BILITOT 0.6 0.6  --   < > 0.32 <0.30 0.32  < > = values in this interval not displayed.   PATHOLOGY REPORT  Diagnosis 03/30/2015 1. Colon, polyp(s), cecal colon polyp, excision - TUBULAR ADENOMA. NO HIGH GRADE DYSPLASIA OR MALIGNANCY IDENTIFIED. 2. Colon, biopsy, sigmoid mass - INVASIVE ADENOCARCINOMA, SEE COMMENT. Microscopic Comment 2. Dr. Saralyn Pilar reviewed this case and concurs. The results were discussed with Dr. Michail Sermon on 03/31/15. (BNS:ds 03/31/15)   RADIOGRAPHIC STUDIES: I have personally reviewed the radiological images as listed and agreed with the findings in the report.  Ct Chest, abdomen and pelvis W Contrast 04/03/2015    IMPRESSION: Nodular masslike area of colonic wall thickening and rectosigmoid junction, consistent with known primary colon carcinoma.  Tiny pericolonic lymph nodes in sigmoid mesocolon measuring up to 6 mm, suspicious for local metastatic disease.  Diffuse hepatic steatosis.  No liver metastases identified.  Bilateral pulmonary nodules measuring approximately 1 cm, highly suspicious for pulmonary metastases.   Electronically Signed   By: Earle Gell M.D.   On: 04/03/2015 12:57   Ct Renal Stone Study 03/15/2015      IMPRESSION: 3 mm stone proximal right ureter causing partial obstruction of the right kidney  11 mm right lower lobe nodule. Differential includes benign and malignant nodules including metastatic disease. CT chest with contrast recommended for further evaluation. Consider performing contrast-enhanced CT abdomen pelvis at the time of the chest CT  to evaluate for malignancy.  Sigmoid diverticulosis. Mucosal edema in the sigmoid colon could represent colonic neoplasm versus chronic diverticular change. This could be evaluated at CT abdomen pelvis with contrast. Endoscopy also recommended to exclude tumor.   Electronically Signed   By: Franchot Gallo M.D.   On: 03/15/2015 10:34   PET 04/12/2015 IMPRESSION: Hypermetabolic colonic mass near rectosigmoid junction, consistent with known primary colon carcinoma.  Tiny sub-cm pericolonic lymph nodes in sigmoid mesocolon are too small to characterize by PET, but are suspicious for early lymph node metastases.  Bilateral pulmonary metastases    ASSESSMENT & PLAN:  54 year old male, without significant past medical history except kidney stone, presented with intermittent bloody stool for 2 years, and colonoscopy showed a large sigmoid colon mass, CT scan showed multiple (at least 4) nodules in bilateral lungs, measuring about 1 cm.  1. Sigmoid colon adenocarcinoma, [id number], probably stage IV with lung mets  -I reviewed his colonoscopy, CT scan findings and the biopsy results in great details with patient and his wife. -I personally reviewed his CT scan image with him, he has at least 4 lung nodules in bilateral lungs, measuring about 1 cm, highly suspicious for metastatic disease.  -I reviewed his PET CT scan image with patient and his wife in person. His multiple lung nodules are PET positive with max SUV 5.8. These are most consistent with metastatic disease. Unfortunately CT-guided lung nodule biopsy was attempted but unsuccessful. -His sigmoid colon  mass is not obstructed, bleeding has been minimal, he does not need urgent surgery. But we did discuss the possibility of bowl obstruction, bleeding, ulceration, down the road and that he may need urgent surgery in that situation. -I recommend him to start systemic chemotherapy with FOLFOX or Capeox (capecitabine 2 weeks on, and one-week off, and oxaliplatin, every 3 weeks), for his disease control. He opted Capeox.  -I discussed his Foundation one genomic testing result with him and his wife in great details. His tumor contains the p53 gene mutation, which is currently not palpable. Mutation burden is low, not a good candidate for immunotherapy. His tumor does not contain KRAS/NRAS or BRAF mutation, so he would benefit from EGFR antibody, including cetuximab and Panitumumab,  -Giving his colon cancer is located in sigmoid colon, previous clinical trials has clearly demonstrated the significant greater benefit of PFS and OS from Panitumumab than Avastin, and I strongly recommend him to consider adding Panitumumab to his chemotherapy after his insurance approval -The potential side effect of Panitumumab, especially skin rash, which is very common, and could be severe, fatigue, and infusion reaction, and skin rash management were discussed with him in great details. He had multiple questions, initially was reluctant due to the concern of skin rash, his wife strongly encouraged him to take it. And he finally decided to try with next cycle chemotherapy -I also discussed switching his CAPEOX to FOLFOX to match the panitumumab scheudle, he declined for now -If he has excellent response to systemic chemotherapy, colon surgery to remove the primary tumor is also reasonable. -we plan to repeat his CT scan in Jan 2017  -laboratory reviewed, adequate for treatment, we'll proceed 3rd cycle CAPEOX today   2. Gastric pain -secondary to Xeloda -much improved with Prilosec   -I told him to avoid acid food and drinks,  no fried or spicy food  3. Mild diarrhea -Secondary to chemotherapy. -He knows to use Imodium as needed  Plan -third cycle Capeox today -add panitumumab from next cycle  -I  will see him back in 3 weeks  -restaging CT before cycle 5 in Jan 2017   I spent 30 minutes counseling the patient face to face. The total time spent in the appointment was 40 minutes and more than 50% was on counseling.     Truitt Merle, MD 06/08/2015 8:46 AM

## 2015-06-09 LAB — CEA: CEA: <0.5 ng/mL (ref 0.0–5.0)

## 2015-06-13 ENCOUNTER — Telehealth: Payer: Self-pay | Admitting: *Deleted

## 2015-06-13 ENCOUNTER — Other Ambulatory Visit: Payer: Self-pay | Admitting: Hematology

## 2015-06-13 DIAGNOSIS — C189 Malignant neoplasm of colon, unspecified: Secondary | ICD-10-CM

## 2015-06-13 MED ORDER — ONDANSETRON HCL 8 MG PO TABS: 8.0000 mg | ORAL_TABLET | Freq: Two times a day (BID) | ORAL | Status: DC

## 2015-06-13 NOTE — Telephone Encounter (Signed)
"  I need a refill on zofran."

## 2015-06-13 NOTE — Telephone Encounter (Signed)
I refilled it, thanks.  Truitt Merle  06/13/2015

## 2015-06-13 NOTE — Telephone Encounter (Signed)
Called patient to let him know.

## 2015-06-29 ENCOUNTER — Ambulatory Visit (HOSPITAL_BASED_OUTPATIENT_CLINIC_OR_DEPARTMENT_OTHER): Payer: BLUE CROSS/BLUE SHIELD | Admitting: Hematology

## 2015-06-29 ENCOUNTER — Encounter: Payer: Self-pay | Admitting: Hematology

## 2015-06-29 ENCOUNTER — Other Ambulatory Visit (HOSPITAL_BASED_OUTPATIENT_CLINIC_OR_DEPARTMENT_OTHER): Payer: BLUE CROSS/BLUE SHIELD

## 2015-06-29 ENCOUNTER — Ambulatory Visit (HOSPITAL_BASED_OUTPATIENT_CLINIC_OR_DEPARTMENT_OTHER): Payer: BLUE CROSS/BLUE SHIELD

## 2015-06-29 VITALS — BP 148/87 | HR 105 | Temp 98.3°F | Resp 19 | Ht 66.0 in | Wt 215.9 lb

## 2015-06-29 DIAGNOSIS — C187 Malignant neoplasm of sigmoid colon: Secondary | ICD-10-CM

## 2015-06-29 DIAGNOSIS — Z5111 Encounter for antineoplastic chemotherapy: Secondary | ICD-10-CM

## 2015-06-29 DIAGNOSIS — R109 Unspecified abdominal pain: Secondary | ICD-10-CM

## 2015-06-29 DIAGNOSIS — C7800 Secondary malignant neoplasm of unspecified lung: Secondary | ICD-10-CM | POA: Diagnosis not present

## 2015-06-29 DIAGNOSIS — C189 Malignant neoplasm of colon, unspecified: Secondary | ICD-10-CM

## 2015-06-29 DIAGNOSIS — Z5112 Encounter for antineoplastic immunotherapy: Secondary | ICD-10-CM

## 2015-06-29 DIAGNOSIS — K521 Toxic gastroenteritis and colitis: Secondary | ICD-10-CM

## 2015-06-29 LAB — MAGNESIUM: Magnesium: 2.2 mg/dl (ref 1.5–2.5)

## 2015-06-29 LAB — CBC WITH DIFFERENTIAL/PLATELET
BASO%: 0.7 % (ref 0.0–2.0)
Basophils Absolute: 0 10*3/uL (ref 0.0–0.1)
EOS ABS: 0 10*3/uL (ref 0.0–0.5)
EOS%: 0.7 % (ref 0.0–7.0)
HEMATOCRIT: 40.2 % (ref 38.4–49.9)
HGB: 13.4 g/dL (ref 13.0–17.1)
LYMPH%: 27.4 % (ref 14.0–49.0)
MCH: 29.7 pg (ref 27.2–33.4)
MCHC: 33.4 g/dL (ref 32.0–36.0)
MCV: 88.7 fL (ref 79.3–98.0)
MONO#: 0.8 10*3/uL (ref 0.1–0.9)
MONO%: 15.4 % — AB (ref 0.0–14.0)
NEUT#: 3 10*3/uL (ref 1.5–6.5)
NEUT%: 55.8 % (ref 39.0–75.0)
Platelets: 245 10*3/uL (ref 140–400)
RBC: 4.53 10*6/uL (ref 4.20–5.82)
RDW: 19.3 % — AB (ref 11.0–14.6)
WBC: 5.3 10*3/uL (ref 4.0–10.3)
lymph#: 1.5 10*3/uL (ref 0.9–3.3)

## 2015-06-29 LAB — COMPREHENSIVE METABOLIC PANEL
ALT: 51 U/L (ref 0–55)
AST: 35 U/L — AB (ref 5–34)
Albumin: 3.7 g/dL (ref 3.5–5.0)
Alkaline Phosphatase: 92 U/L (ref 40–150)
Anion Gap: 12 mEq/L — ABNORMAL HIGH (ref 3–11)
BUN: 7.6 mg/dL (ref 7.0–26.0)
CALCIUM: 9.2 mg/dL (ref 8.4–10.4)
CHLORIDE: 104 meq/L (ref 98–109)
CO2: 24 meq/L (ref 22–29)
CREATININE: 0.8 mg/dL (ref 0.7–1.3)
EGFR: >90 mL/min/{1.73_m2} (ref >90)
GLUCOSE: 138 mg/dL (ref 70–140)
POTASSIUM: 3.5 meq/L (ref 3.5–5.1)
SODIUM: 140 meq/L (ref 136–145)
Total Bilirubin: 0.46 mg/dL (ref 0.20–1.20)
Total Protein: 7.7 g/dL (ref 6.4–8.3)

## 2015-06-29 MED ORDER — TRAMADOL HCL 50 MG PO TABS
50.0000 mg | ORAL_TABLET | Freq: Three times a day (TID) | ORAL | Status: DC | PRN
Start: 2015-06-29 — End: 2015-10-19

## 2015-06-29 MED ORDER — DEXTROSE 5 % IV SOLN
Freq: Once | INTRAVENOUS | Status: AC
  Administered 2015-06-29: 16:00:00 via INTRAVENOUS

## 2015-06-29 MED ORDER — SUCRALFATE 1 G PO TABS: 1.0000 g | ORAL_TABLET | Freq: Three times a day (TID) | ORAL | Status: DC

## 2015-06-29 MED ORDER — HYDROCORTISONE 2.5 % EX LOTN: TOPICAL_LOTION | Freq: Two times a day (BID) | CUTANEOUS | Status: DC

## 2015-06-29 MED ORDER — SODIUM CHLORIDE 0.9 % IV SOLN
Freq: Once | INTRAVENOUS | Status: AC
  Administered 2015-06-29: 15:00:00 via INTRAVENOUS
  Filled 2015-06-29: qty 4

## 2015-06-29 MED ORDER — OXALIPLATIN CHEMO INJECTION 100 MG/20ML
130.0000 mg/m2 | Freq: Once | INTRAVENOUS | Status: AC
  Administered 2015-06-29: 285 mg via INTRAVENOUS
  Filled 2015-06-29: qty 57

## 2015-06-29 MED ORDER — SODIUM CHLORIDE 0.9 % IV SOLN
Freq: Once | INTRAVENOUS | Status: AC
  Administered 2015-06-29: 14:00:00 via INTRAVENOUS

## 2015-06-29 MED ORDER — PANITUMUMAB CHEMO INJECTION 100 MG/5ML
6.0000 mg/kg | Freq: Once | INTRAVENOUS | Status: AC
  Administered 2015-06-29: 600 mg via INTRAVENOUS
  Filled 2015-06-29: qty 20

## 2015-06-29 MED ORDER — CLINDAMYCIN PHOSPHATE 1 % EX GEL
Freq: Two times a day (BID) | CUTANEOUS | Status: DC
Start: 2015-06-29 — End: 2015-08-10

## 2015-06-29 NOTE — Progress Notes (Signed)
[place]  [phone] [phone]  Clinic Follow Up Note   Patient Care Team: Dibas Koirala, MD as PCP - General (Family Medicine) Wilford Corner, MD as Consulting Physician (Gastroenterology) Jackolyn Confer, MD as Consulting Physician (General Surgery) Truitt Merle, MD as Consulting Physician (Hematology) 06/29/2015  CHIEF COMPLAINTS:  Follow Up colon cancer  Oncology History   Colon cancer metastasized to lung [facility])   Staging form: Colon and Rectum, AJCC 7th Edition     Clinical stage from 03/30/2015: Stage Unknown ([place], N1, M1) - Unsigned       Colon cancer metastasized to lung ([place])   03/30/2015 Initial Biopsy Sigmoid mass biopsy showed invasive adenocarcinoma. Cecal colon polyps showed tubular adenoma.   03/30/2015 Initial Diagnosis Colon cancer   03/30/2015 Procedure colonoscopy by Dr. Michail Sermon showed a fungating, infiltrative and ulcerated nonobstructing large mass in the sigmoid colon and at 20 cm proximal to the anus. The mass was partially circumferential no bleeding. A 10 mm polyps in the cecum was removed.   04/03/2015 Imaging CT chest, abdomen and pelvis with contrast showed nodular masslike area of clinical worsening at rectosigmoid junction, tiny pericolonic lymph nodes, bilateral pulmonary nodules measuring about 1 cm.   04/12/2015 PET scan Hypermetabolic colonic mass near rectosigmoid junction, tiny subcentimeter paracolonic lymph nodes. Bilateral pulmonary metastasis.   04/19/2015 Procedure CT-guided lung nodule biopsy attempted, unsuccessful.   04/27/2015 -  Chemotherapy Oxaliplatin 130 mg/m on day 1, Capecitabine 2364m q12hr, 2 weeks on and one week off (only received 7 days for first cycle)    HISTORY OF PRESENTING ILLNESS:  Bill Beckstead54y.o. male is here because of recently newly diagnosed colon cancer.  He has had intermittent bloody stool for 2 years, it has been mild, mixed with stool, patient does not have any  abdominal pain, constipation, change of his bowel habits, nausea, weight loss or other symptoms. He did not seek medical attention for this. He went to emergency room on 03/15/2015 for right flank pain, due to his kidney stone. CT scan incidentally found a 11 mm right lower lobe nodule and mucosal edema in the sigmoid colon. He saw his primary care physician, and was referred to GI Dr. SMichail Sermonhere at he underwent colonoscopy on 03/30/2015, which showed a fungating infiltrative and ulcerated nonobstructing large mass in the sigmoid colon, biopsy showed adenocarcinoma. CT chest abdomen and pelvis showed multiple lung nodules measuring about 1 cm. He was referred to surgeon Dr. RZella Richer who referred patient to [place] further work[place] of his lung nodule and discuss chemotherapy.  He feels very well overall, denies any symptoms. He is a [employer] lives with his wife and 3 children. He never had screening colonoscopy prior the reason one, no significant past medical history, does not see doctors regularly.  CURRENT TREATMENT: Oxaliplatin 130 mg/m on day 1, Capecitabine 23023mq12hr, D1-14, every 21 days, will add Panitumumab every 2 weeks from cycle 4  INTERIM HISTORY EdAureliuseturns for follow-up and 4th cycle chemo. He had persistent gastric pain after the oxaliplatin infusion, moderate to severe, no association with food or the Xeloda pills. No significant nausea, abdominal bloating, or change of his bowel habits. No pain when he sleeps at night. He is able to continue working full-time, but he is very bothered by the pain. He did skip the Xeloda for a few days and the pain did not change. He otherwise tolerated treatment well.  MEDICAL HISTORY:  Past Medical History  Diagnosis  Date  . Kidney calculi   . Hypercholesteremia     SURGICAL HISTORY: Past Surgical History  Procedure Laterality Date  . Colonoscopy  03/30/15    SOCIAL HISTORY: Social History   Social History  .  Marital Status: Married    Spouse Name: N/A  . Number of Children: 3, age of 2, 60 and 51   . Years of Education: N/A   Occupational History  . Banker for [employer] [place]    Social History Main Topics  . Smoking status: Never Smoker   . Smokeless tobacco: Not on file  . Alcohol Use: No  . Drug Use: No  . Sexual Activity: Not on file   Other Topics Concern  . Not on file   Social History Narrative    FAMILY HISTORY: Family History  Problem Relation Age of Onset  . Cancer Mother 36    breast cancer   . Diabetes Maternal Grandmother     ALLERGIES:  has No Known Allergies.  MEDICATIONS:  Current Outpatient Prescriptions  Medication Sig Dispense Refill  . acetaminophen (TYLENOL) 500 MG tablet Take 500 mg by mouth every 6 (six) hours as needed.    . capecitabine (XELODA) 150 MG tablet Take 1 tablet (150 mg total) by mouth 2 (two) times daily after a meal. Take on days 1-14 of chemotherapy. 28 tablet 3  . capecitabine (XELODA) 500 MG tablet Take 4 tablets (2,000 mg total) by mouth 2 (two) times daily after a meal. Take on days 1-14 of chemotherapy. 112 tablet 3  . loperamide (IMODIUM A-D) 2 MG tablet Take 4 mg by mouth daily as needed for diarrhea or loose stools.    Marland Kitchen omeprazole (PRILOSEC) 20 MG capsule Take 1 capsule (20 mg total) by mouth 2 (two) times daily before a meal. 60 capsule 3  . ondansetron (ZOFRAN) 8 MG tablet Take 1 tablet (8 mg total) by mouth every 8 (eight) hours as needed for nausea or vomiting. 10 tablet 0  . ondansetron (ZOFRAN) 8 MG tablet Take 1 tablet (8 mg total) by mouth 2 (two) times daily. Start the day after chemo for 2 days. Then take as needed for nausea or vomiting. 30 tablet 1  . ranitidine (ZANTAC) 150 MG tablet Take 150 mg by mouth daily.    . clindamycin (CLINDAGEL) 1 % gel Apply topically 2 (two) times daily. 30 g 3  . hydrocortisone 2.5 % lotion Apply topically 2 (two) times daily. 59 mL 3  . sucralfate (CARAFATE) 1 G tablet Take 1 tablet (1  g total) by mouth 4 (four) times daily -  with meals and at bedtime. 60 tablet 1  . traMADol (ULTRAM) 50 MG tablet Take 1 tablet (50 mg total) by mouth every 8 (eight) hours as needed. 30 tablet 1   No current facility-administered medications for this visit.   Facility-Administered Medications Ordered in Other Visits  Medication Dose Route Frequency Provider Last Rate Last Dose  . dextrose 5 % solution   Intravenous Once Truitt Merle, MD      . oxaliplatin (ELOXATIN) 285 mg in dextrose 5 % 500 mL chemo infusion  130 mg/m2 (Treatment Plan Actual) Intravenous Once Truitt Merle, MD      . panitumumab (VECTIBIX) 600 mg in sodium chloride 0.9 % 100 mL chemo infusion  6 mg/kg (Treatment Plan Actual) Intravenous Once Truitt Merle, MD 130 mL/hr at 06/29/15 1503 600 mg at 06/29/15 1503    REVIEW OF SYSTEMS:   Constitutional: Denies fevers, chills  or abnormal night sweats, no weight loss. Eyes: Denies blurriness of vision, double vision or watery eyes Ears, nose, mouth, throat, and face: Denies mucositis or sore throat Respiratory: Denies cough, dyspnea or wheezes Cardiovascular: Denies palpitation, chest discomfort or lower extremity swelling Gastrointestinal:  Denies nausea, heartburn or change in bowel habits Skin: Denies abnormal skin rashes Lymphatics: Denies new lymphadenopathy or easy bruising Neurological:Denies numbness, tingling or new weaknesses Behavioral/Psych: Mood is stable, no new changes  All other systems were reviewed with the patient and are negative.  PHYSICAL EXAMINATION: ECOG PERFORMANCE STATUS: 0 - Asymptomatic BP 148/87 mmHg  Pulse 105  Temp(Src) 98.3 F (36.8 C) (Oral)  Resp 19  Ht 5' 6"  (1.676 m)  Wt 215 lb 14.4 oz (97.932 kg)  BMI 34.86 kg/m2  SpO2 98% GENERAL:alert, no distress and comfortable SKIN: skin color, texture, turgor are normal, no rashes or significant lesions EYES: normal, conjunctiva are pink and non-injected, sclera clear OROPHARYNX:no exudate, no erythema  and lips, buccal mucosa, and tongue normal  NECK: supple, thyroid normal size, non-tender, without nodularity LYMPH:  no palpable lymphadenopathy in the cervical, axillary or inguinal LUNGS: clear to auscultation and percussion with normal breathing effort HEART: regular rate & rhythm and no murmurs and no lower extremity edema ABDOMEN:abdomen soft, non-tender and normal bowel sounds Musculoskeletal:no cyanosis of digits and no clubbing  PSYCH: alert & oriented x 3 with fluent speech NEURO: no focal motor/sensory deficits  LABORATORY DATA:  I have reviewed the data as listed CBC Latest Ref Rng 06/29/2015 06/08/2015 05/18/2015  WBC 4.0 - 10.3 10e3/uL 5.3 5.0 5.3  Hemoglobin 13.0 - 17.1 g/dL 13.4 12.8(L) 12.8(L)  Hematocrit 38.4 - 49.9 % 40.2 38.0(L) 37.4(L)  Platelets 140 - 400 10e3/uL 245 211 211     Recent Labs  03/15/15 0925 03/16/15 1710 04/19/15 1125  05/18/15 1303 06/08/15 1214 06/29/15 1255  NA 137 138 139  < > 138 139 140  K 4.0 4.0 3.8  < > 3.9 3.9 3.5  CL 105 103 107  --   --   --   --   CO2 24 26 24   < > 23 25 24   GLUCOSE 147* 149* 93  < > 150* 114 138  BUN 7 14 12   < > 12.2 8.1 7.6  CREATININE 0.81 1.04 0.73  < > 0.8 0.9 0.8  CALCIUM 9.0 9.4 9.4  < > 9.2 9.4 9.2  GFRNONAA >60 >60 >60  --   --   --   --   GFRAA >60 >60 >60  --   --   --   --   PROT 6.6 7.2  --   < > 6.9 7.3 7.7  ALBUMIN 3.5 3.7  --   < > 3.6 3.7 3.7  AST 34 35  --   < > 22 27 35*  ALT 39 45  --   < > 34 34 51  ALKPHOS 82 81  --   < > 92 87 92  BILITOT 0.6 0.6  --   < > 0.32 0.54 0.46  < > = values in this interval not displayed.   PATHOLOGY REPORT  Diagnosis 03/30/2015 1. Colon, polyp(s), cecal colon polyp, excision - TUBULAR ADENOMA. NO HIGH GRADE DYSPLASIA OR MALIGNANCY IDENTIFIED. 2. Colon, biopsy, sigmoid mass - INVASIVE ADENOCARCINOMA, SEE COMMENT. Microscopic Comment 2. Dr. Saralyn Pilar reviewed this case and concurs. The results were discussed with Dr. Michail Sermon on 03/31/15.  (BNS:ds 03/31/15)   RADIOGRAPHIC STUDIES: I have personally reviewed  the radiological images as listed and agreed with the findings in the report.  Ct Chest, abdomen and pelvis W Contrast 04/03/2015    IMPRESSION: Nodular masslike area of colonic wall thickening and rectosigmoid junction, consistent with known primary colon carcinoma.  Tiny pericolonic lymph nodes in sigmoid mesocolon measuring up to 6 mm, suspicious for local metastatic disease.  Diffuse hepatic steatosis.  No liver metastases identified.  Bilateral pulmonary nodules measuring approximately 1 cm, highly suspicious for pulmonary metastases.   Electronically Signed   By: Earle Gell M.D.   On: 04/03/2015 12:57   Ct Renal Stone Study 03/15/2015    IMPRESSION: 3 mm stone proximal right ureter causing partial obstruction of the right kidney  11 mm right lower lobe nodule. Differential includes benign and malignant nodules including metastatic disease. CT chest with contrast recommended for further evaluation. Consider performing contrast-enhanced CT abdomen pelvis at the time of the chest CT to evaluate for malignancy.  Sigmoid diverticulosis. Mucosal edema in the sigmoid colon could represent colonic neoplasm versus chronic diverticular change. This could be evaluated at CT abdomen pelvis with contrast. Endoscopy also recommended to exclude tumor.   Electronically Signed   By: Franchot Gallo M.D.   On: 03/15/2015 10:34   PET 04/12/2015 IMPRESSION: Hypermetabolic colonic mass near rectosigmoid junction, consistent with known primary colon carcinoma.  Tiny sub-cm pericolonic lymph nodes in sigmoid mesocolon are too small to characterize by PET, but are suspicious for early lymph node metastases.  Bilateral pulmonary metastases    ASSESSMENT & PLAN:  54 year old male, without significant past medical history except kidney stone, presented with intermittent bloody stool for 2 years, and colonoscopy showed a large sigmoid colon mass,  CT scan showed multiple (at least 4) nodules in bilateral lungs, measuring about 1 cm.  1. Sigmoid colon adenocarcinoma, TxN1M1, probably stage IV with lung mets  -I reviewed his colonoscopy, CT scan findings and the biopsy results in great details with patient and his wife. -I personally reviewed his CT scan image with him, he has at least 4 lung nodules in bilateral lungs, measuring about 1 cm, highly suspicious for metastatic disease.  -I reviewed his PET CT scan image with patient and his wife in person. His multiple lung nodules are PET positive with max SUV 5.8. These are most consistent with metastatic disease. Unfortunately CT-guided lung nodule biopsy was attempted but unsuccessful. -His sigmoid colon mass is not obstructed, bleeding has been minimal, he does not need urgent surgery. But we did discuss the possibility of bowl obstruction, bleeding, ulceration, down the road and that he may need urgent surgery in that situation. -He is on first line chemo with Capeox, main side effect is his gastric pain.  -- His tumor does not contain KRAS/NRAS or BRAF mutation, so he would benefit from EGFR antibody, Panitumumab was added on from cycle 4.  -I also discussed switching his CAPEOX to FOLFOX or FOLFIRI to match the panitumumab scheudle, he declined for now. He does not want a port  -If he has excellent response to systemic chemotherapy, colon surgery to remove the primary tumor is also reasonable. -we plan to repeat his CT scan in Jan 2017   2. Gastric pain -secondary to oxaliplatin  -Initially responded to omeprazole, getting worse lately.  3. Mild diarrhea -Secondary to chemotherapy. -He knows to use Imodium as needed  Plan -4th cycle Capeox and first dose panitumumab  -I called in tramadol and sucralfate for his gastric pain -I called in hydrocortisone 2.5% and  clindamycin gel, he knows to use as soon as he notices skin rash, avoid sun exposure -I'll see him back in 2 weeks. If the  skin rash is mild, he is agreeable to continue panitumumab every 2 weeks -Repeat a CT scan a few days before his cycle 5 Capeox in 3 weeks   Patient had many questions about his surgery, the duration of chemotherapy, and what to do based on the next CT scan findings. I answered all to his satisfaction. I spent 30 minutes counseling the patient face to face. The total time spent in the appointment was 40 minutes and more than 50% was on counseling.     Truitt Merle, MD 06/29/2015 3:32 PM

## 2015-06-29 NOTE — Patient Instructions (Signed)
[place] Discharge Instructions for Patients Receiving Chemotherapy  Today you received the following chemotherapy agents Vectibix/Oxaliplatin.  To help prevent nausea and vomiting after your treatment, we encourage you to take your nausea medication as directed.   If you develop nausea and vomiting that is not controlled by your nausea medication, call the clinic.   BELOW ARE SYMPTOMS THAT SHOULD BE REPORTED IMMEDIATELY:  *FEVER GREATER THAN 100.5 F  *CHILLS WITH OR WITHOUT FEVER  NAUSEA AND VOMITING THAT IS NOT CONTROLLED WITH YOUR NAUSEA MEDICATION  *UNUSUAL SHORTNESS OF BREATH  *UNUSUAL BRUISING OR BLEEDING  TENDERNESS IN MOUTH AND THROAT WITH OR WITHOUT PRESENCE OF ULCERS  *URINARY PROBLEMS  *BOWEL PROBLEMS  UNUSUAL RASH Items with * indicate a potential emergency and should be followed up as soon as possible.  Feel free to call the clinic you have any questions or concerns. The clinic phone number is [phone].  Please show the [place] at check-in to the Emergency Department and triage nurse.  Panitumumab Solution for Injection What is this medicine? PANITUMUMAB (pan i TOOM ue mab) is a monoclonal antibody. It is used to treat colorectal cancer. This medicine may be used for other purposes; ask your health care Fredrika Canby or pharmacist if you have questions. What should I tell my health care Reeda Soohoo before I take this medicine? They need to know if you have any of these conditions: -lung disease, especially lung fibrosis -skin conditions or sensitivity -an unusual or allergic reaction to panitumumab, mouse proteins, other medicines, foods, dyes, or preservatives -pregnant or trying to get pregnant -breast-feeding How should I use this medicine? This drug is given as an infusion into a vein. It is administered in a hospital or clinic by a specially trained health care professional. Talk to your pediatrician regarding the use of this  medicine in children. Special care may be needed. Overdosage: If you think you have taken too much of this medicine contact a poison control center or emergency room at once. NOTE: This medicine is only for you. Do not share this medicine with others. What if I miss a dose? It is important not to miss your dose. Call your doctor or health care professional if you are unable to keep an appointment. What may interact with this medicine? -some medicines for cancer This list may not describe all possible interactions. Give your health care Novice Vrba a list of all the medicines, herbs, non-prescription drugs, or dietary supplements you use. Also tell them if you smoke, drink alcohol, or use illegal drugs. Some items may interact with your medicine. What should I watch for while using this medicine? Visit your doctor for checks on your progress. This drug may make you feel generally unwell. This is not uncommon, as chemotherapy can affect healthy cells as well as cancer cells. Report any side effects. Continue your course of treatment even though you feel ill unless your doctor tells you to stop. This medicine can make you more sensitive to the sun. Keep out of the sun while receiving this medicine and for 2 months after the last dose. If you cannot avoid being in the sun, wear protective clothing and use sunscreen. Do not use sun lamps or tanning beds/booths. In some cases, you may be given additional medicines to help with side effects. Follow all directions for their use. Call your doctor or health care professional for advice if you get a fever, chills or sore throat, or other symptoms of a cold or  flu. Do not treat yourself. This drug decreases your body's ability to fight infections. Try to avoid being around people who are sick. Avoid taking products that contain aspirin, acetaminophen, ibuprofen, naproxen, or ketoprofen unless instructed by your doctor. These medicines may hide a fever. Do not become  pregnant while taking this medicine and for 6 months after the last dose. Women should inform their doctor if they wish to become pregnant or think they might be pregnant. Men should not father a child while taking this medicine and for 6 months after the last dose. There is a potential for serious side effects to an unborn child. Talk to your health care professional or pharmacist for more information. Do not breast-feed an infant while taking this medicine. What side effects may I notice from receiving this medicine? Side effects that you should report to your doctor or health care professional as soon as possible: -allergic reactions like skin rash, itching or hives, swelling of the face, lips, or tongue -breathing problems -changes in vision -fast, irregular heartbeat -feeling faint or lightheaded, falls -fever, chills -mouth sores -swelling of the ankles, feet, hands -unusually weak or tired Side effects that usually do not require medical attention (report to your doctor or health care professional if they continue or are bothersome): -changes in skin like acne, cracks, skin dryness -constipation -diarrhea -eyelash growth -headache -nail changes -nausea, vomiting -stomach upset This list may not describe all possible side effects. Call your doctor for medical advice about side effects. You may report side effects to FDA at 1-800-FDA-1088. Where should I keep my medicine? This drug is given in a hospital or clinic and will not be stored at home. NOTE: This sheet is a summary. It may not cover all possible information. If you have questions about this medicine, talk to your doctor, pharmacist, or health care Teal Bontrager.    2016, Elsevier/Gold Standard. (2014-08-23 17:21:33)

## 2015-06-30 ENCOUNTER — Telehealth: Payer: Self-pay | Admitting: *Deleted

## 2015-06-30 ENCOUNTER — Telehealth: Payer: Self-pay | Admitting: Hematology

## 2015-06-30 NOTE — Telephone Encounter (Signed)
per pof to sch pt appt-sent MW email to sch trmt-pt aware °

## 2015-06-30 NOTE — Telephone Encounter (Signed)
Spoke with pt for post chemo follow up call today.  Pt stated he is doing fine ; has no new complaints.  Pt has question about his appts on 07/20/15 - too early in am - this could cause pt  To lose  a day of work.  Pt would like to know if appts could be changed to afternoon.  Informed pt that message will be relayed to md.  Pt will be informed of md's decisions at office visit on 07/13/15.  Pt voiced understanding.

## 2015-06-30 NOTE — Addendum Note (Signed)
Addended by: Truitt Merle on: 06/30/2015 11:06 AM   Modules accepted: Orders

## 2015-06-30 NOTE — Telephone Encounter (Signed)
Per staff message and POF I have scheduled appts. Advised scheduler of appts. JMW  

## 2015-07-06 ENCOUNTER — Other Ambulatory Visit: Payer: Self-pay | Admitting: Nurse Practitioner

## 2015-07-09 DIAGNOSIS — C801 Malignant (primary) neoplasm, unspecified: Secondary | ICD-10-CM

## 2015-07-09 HISTORY — DX: Malignant (primary) neoplasm, unspecified: C80.1

## 2015-07-13 ENCOUNTER — Encounter: Payer: Self-pay | Admitting: Hematology

## 2015-07-13 ENCOUNTER — Ambulatory Visit (HOSPITAL_BASED_OUTPATIENT_CLINIC_OR_DEPARTMENT_OTHER): Payer: BLUE CROSS/BLUE SHIELD

## 2015-07-13 ENCOUNTER — Ambulatory Visit (HOSPITAL_BASED_OUTPATIENT_CLINIC_OR_DEPARTMENT_OTHER): Payer: BLUE CROSS/BLUE SHIELD | Admitting: Hematology

## 2015-07-13 ENCOUNTER — Telehealth: Payer: Self-pay | Admitting: Hematology

## 2015-07-13 ENCOUNTER — Other Ambulatory Visit (HOSPITAL_BASED_OUTPATIENT_CLINIC_OR_DEPARTMENT_OTHER): Payer: BLUE CROSS/BLUE SHIELD

## 2015-07-13 VITALS — BP 132/81 | HR 101 | Temp 98.5°F | Resp 18 | Ht 66.0 in | Wt 208.4 lb

## 2015-07-13 VITALS — HR 84

## 2015-07-13 DIAGNOSIS — Z5112 Encounter for antineoplastic immunotherapy: Secondary | ICD-10-CM | POA: Diagnosis not present

## 2015-07-13 DIAGNOSIS — C189 Malignant neoplasm of colon, unspecified: Secondary | ICD-10-CM

## 2015-07-13 DIAGNOSIS — C187 Malignant neoplasm of sigmoid colon: Secondary | ICD-10-CM

## 2015-07-13 DIAGNOSIS — C7800 Secondary malignant neoplasm of unspecified lung: Principal | ICD-10-CM

## 2015-07-13 DIAGNOSIS — C7802 Secondary malignant neoplasm of left lung: Secondary | ICD-10-CM

## 2015-07-13 DIAGNOSIS — C7801 Secondary malignant neoplasm of right lung: Secondary | ICD-10-CM | POA: Diagnosis not present

## 2015-07-13 DIAGNOSIS — R109 Unspecified abdominal pain: Secondary | ICD-10-CM

## 2015-07-13 DIAGNOSIS — R630 Anorexia: Secondary | ICD-10-CM

## 2015-07-13 DIAGNOSIS — R5383 Other fatigue: Secondary | ICD-10-CM

## 2015-07-13 DIAGNOSIS — R42 Dizziness and giddiness: Secondary | ICD-10-CM

## 2015-07-13 LAB — COMPREHENSIVE METABOLIC PANEL
ALT: 57 U/L — ABNORMAL HIGH (ref 0–55)
ANION GAP: 9 meq/L (ref 3–11)
AST: 41 U/L — ABNORMAL HIGH (ref 5–34)
Albumin: 3.6 g/dL (ref 3.5–5.0)
Alkaline Phosphatase: 89 U/L (ref 40–150)
BILIRUBIN TOTAL: 0.55 mg/dL (ref 0.20–1.20)
BUN: 5.7 mg/dL — ABNORMAL LOW (ref 7.0–26.0)
CALCIUM: 9.1 mg/dL (ref 8.4–10.4)
CO2: 23 meq/L (ref 22–29)
Chloride: 106 mEq/L (ref 98–109)
Creatinine: 0.8 mg/dL (ref 0.7–1.3)
Glucose: 143 mg/dl — ABNORMAL HIGH (ref 70–140)
Potassium: 3.5 mEq/L (ref 3.5–5.1)
Sodium: 139 mEq/L (ref 136–145)
TOTAL PROTEIN: 7.6 g/dL (ref 6.4–8.3)

## 2015-07-13 LAB — CBC WITH DIFFERENTIAL/PLATELET
BASO%: 0.2 % (ref 0.0–2.0)
Basophils Absolute: 0 10*3/uL (ref 0.0–0.1)
EOS ABS: 0 10*3/uL (ref 0.0–0.5)
EOS%: 0.4 % (ref 0.0–7.0)
HEMATOCRIT: 37.7 % — AB (ref 38.4–49.9)
HGB: 13 g/dL (ref 13.0–17.1)
LYMPH#: 1.5 10*3/uL (ref 0.9–3.3)
LYMPH%: 28.8 % (ref 14.0–49.0)
MCH: 30.2 pg (ref 27.2–33.4)
MCHC: 34.5 g/dL (ref 32.0–36.0)
MCV: 87.5 fL (ref 79.3–98.0)
MONO#: 0.6 10*3/uL (ref 0.1–0.9)
MONO%: 12.2 % (ref 0.0–14.0)
NEUT%: 58.4 % (ref 39.0–75.0)
NEUTROS ABS: 3 10*3/uL (ref 1.5–6.5)
PLATELETS: 144 10*3/uL (ref 140–400)
RBC: 4.31 10*6/uL (ref 4.20–5.82)
RDW: 16.4 % — ABNORMAL HIGH (ref 11.0–14.6)
WBC: 5.2 10*3/uL (ref 4.0–10.3)

## 2015-07-13 LAB — MAGNESIUM: MAGNESIUM: 2.1 mg/dL (ref 1.5–2.5)

## 2015-07-13 MED ORDER — SODIUM CHLORIDE 0.9 % IV SOLN
Freq: Once | INTRAVENOUS | Status: AC
  Administered 2015-07-13: 15:00:00 via INTRAVENOUS

## 2015-07-13 MED ORDER — SODIUM CHLORIDE 0.9 % IV SOLN
6.0000 mg/kg | Freq: Once | INTRAVENOUS | Status: AC
  Administered 2015-07-13: 600 mg via INTRAVENOUS
  Filled 2015-07-13: qty 25

## 2015-07-13 NOTE — Patient Instructions (Addendum)
  [place] Discharge Instructions for Patients Receiving Chemotherapy  Today you received the following chemotherapy agents vectibix (panitumumab)   If you develop nausea and vomiting that is not controlled by your nausea medication, call the clinic.   BELOW ARE SYMPTOMS THAT SHOULD BE REPORTED IMMEDIATELY:  *FEVER GREATER THAN 100.5 F  *CHILLS WITH OR WITHOUT FEVER  NAUSEA AND VOMITING THAT IS NOT CONTROLLED WITH YOUR NAUSEA MEDICATION  *UNUSUAL SHORTNESS OF BREATH  *UNUSUAL BRUISING OR BLEEDING  TENDERNESS IN MOUTH AND THROAT WITH OR WITHOUT PRESENCE OF ULCERS  *URINARY PROBLEMS  *BOWEL PROBLEMS  UNUSUAL RASH Items with * indicate a potential emergency and should be followed up as soon as possible.  Feel free to call the clinic you have any questions or concerns. The clinic phone number is [phone].  Please show the [place] at check-in to the Emergency Department and triage nurse.

## 2015-07-13 NOTE — Progress Notes (Signed)
[place]  [phone] [phone]  Clinic Follow Up Note   Patient Care Team: Dibas Koirala, MD as PCP - General (Family Medicine) Wilford Corner, MD as Consulting Physician (Gastroenterology) Jackolyn Confer, MD as Consulting Physician (General Surgery) Truitt Merle, MD as Consulting Physician (Hematology) 07/13/2015  CHIEF COMPLAINTS:  Follow Up colon cancer  Oncology History   Colon cancer metastasized to lung [facility])   Staging form: Colon and Rectum, AJCC 7th Edition     Clinical stage from 03/30/2015: Stage Unknown ([place], N1, M1) - Unsigned       Colon cancer metastasized to lung ([place])   03/30/2015 Initial Biopsy Sigmoid mass biopsy showed invasive adenocarcinoma. Cecal colon polyps showed tubular adenoma.   03/30/2015 Initial Diagnosis Colon cancer   03/30/2015 Procedure colonoscopy by Dr. Michail Sermon showed a fungating, infiltrative and ulcerated nonobstructing large mass in the sigmoid colon and at 20 cm proximal to the anus. The mass was partially circumferential no bleeding. A 10 mm polyps in the cecum was removed.   04/03/2015 Imaging CT chest, abdomen and pelvis with contrast showed nodular masslike area of clinical worsening at rectosigmoid junction, tiny pericolonic lymph nodes, bilateral pulmonary nodules measuring about 1 cm.   04/12/2015 PET scan Hypermetabolic colonic mass near rectosigmoid junction, tiny subcentimeter paracolonic lymph nodes. Bilateral pulmonary metastasis.   04/19/2015 Procedure CT-guided lung nodule biopsy attempted, unsuccessful.   04/27/2015 -  Chemotherapy Oxaliplatin 130 mg/m on day 1, Capecitabine 2328m q12hr, 2 weeks on and one week off (only received 7 days for first cycle)    HISTORY OF PRESENTING ILLNESS:  Bill Vanhouten89y.o. male is here because of recently newly diagnosed colon cancer.  He has had intermittent bloody stool for 2 years, it has been mild, mixed with stool, patient does not have any abdominal  pain, constipation, change of his bowel habits, nausea, weight loss or other symptoms. He did not seek medical attention for this. He went to emergency room on 03/15/2015 for right flank pain, due to his kidney stone. CT scan incidentally found a 11 mm right lower lobe nodule and mucosal edema in the sigmoid colon. He saw his primary care physician, and was referred to GI Dr. SMichail Sermonhere at he underwent colonoscopy on 03/30/2015, which showed a fungating infiltrative and ulcerated nonobstructing large mass in the sigmoid colon, biopsy showed adenocarcinoma. CT chest abdomen and pelvis showed multiple lung nodules measuring about 1 cm. He was referred to surgeon Dr. RZella Richer who referred patient to [place] of his lung nodule and discuss chemotherapy.  He feels very well overall, denies any symptoms. He is a [employer] lives with his wife and 3 children. He never had screening colonoscopy prior the reason one, no significant past medical history, does not see doctors regularly.  CURRENT TREATMENT: Oxaliplatin 130 mg/m on day 1, Capecitabine 2309mq12hr, D1-14, every 21 days, added Panitumumab every 2 weeks from cycle 4  INTERIM HISTORY EdNathanueleturns for follow-up and second cycle panitumumab.  He can't quite sick after the last cycle chemotherapy and panitumumab.  He had normal fatigue, low appetite, mild diarrhea for 4-5 days, then developed severe dizziness, imbalance, along with very poor appetite, for 4-5 days.  He was able to keep fluids down, but did not eat much during those days. He did not call [place] did not go to Emergency room.  He finally recovered well, was able to go back to work 2 days ago,  And has  been feeling very well in the past 2 days. His epigastric pain  Did not respond to the medication I gave him last time, however it disappeared since he started having dizziness and anorexia last week.  He developed mild skin rash on his chest and face,  He has  been using the creams I give him, which has been very helpful.  He lost about 7 pounds in the past 2 weeks.  MEDICAL HISTORY:  Past Medical History  Diagnosis Date  . Kidney calculi   . Hypercholesteremia     SURGICAL HISTORY: Past Surgical History  Procedure Laterality Date  . Colonoscopy  03/30/15    SOCIAL HISTORY: Social History   Social History  . Marital Status: Married    Spouse Name: N/A  . Number of Children: 3, age of 55, 19 and 35   . Years of Education: N/A   Occupational History  . Banker for [employer] [place]    Social History Main Topics  . Smoking status: Never Smoker   . Smokeless tobacco: Not on file  . Alcohol Use: No  . Drug Use: No  . Sexual Activity: Not on file   Other Topics Concern  . Not on file   Social History Narrative    FAMILY HISTORY: Family History  Problem Relation Age of Onset  . Cancer Mother 32    breast cancer   . Diabetes Maternal Grandmother     ALLERGIES:  has No Known Allergies.  MEDICATIONS:  Current Outpatient Prescriptions  Medication Sig Dispense Refill  . acetaminophen (TYLENOL) 500 MG tablet Take 500 mg by mouth every 6 (six) hours as needed.    . clindamycin (CLINDAGEL) 1 % gel Apply topically 2 (two) times daily. 30 g 3  . hydrocortisone 2.5 % lotion Apply topically 2 (two) times daily. 59 mL 3  . loperamide (IMODIUM A-D) 2 MG tablet Take 4 mg by mouth daily as needed for diarrhea or loose stools.    Marland Kitchen omeprazole (PRILOSEC) 20 MG capsule Take 1 capsule (20 mg total) by mouth 2 (two) times daily before a meal. 60 capsule 3  . ondansetron (ZOFRAN) 8 MG tablet Take 1 tablet (8 mg total) by mouth 2 (two) times daily. Start the day after chemo for 2 days. Then take as needed for nausea or vomiting. 30 tablet 1  . ranitidine (ZANTAC) 150 MG tablet Take 150 mg by mouth daily.    . sucralfate (CARAFATE) 1 G tablet Take 1 tablet (1 g total) by mouth 4 (four) times daily -  with meals and at bedtime. 60 tablet 1  .  traMADol (ULTRAM) 50 MG tablet Take 1 tablet (50 mg total) by mouth every 8 (eight) hours as needed. 30 tablet 1  . capecitabine (XELODA) 150 MG tablet Take 1 tablet (150 mg total) by mouth 2 (two) times daily after a meal. Take on days 1-14 of chemotherapy. (Patient not taking: Reported on 07/13/2015) 28 tablet 3  . capecitabine (XELODA) 500 MG tablet Take 4 tablets (2,000 mg total) by mouth 2 (two) times daily after a meal. Take on days 1-14 of chemotherapy. (Patient not taking: Reported on 07/13/2015) 112 tablet 3   No current facility-administered medications for this visit.    REVIEW OF SYSTEMS:   Constitutional: Denies fevers, chills or abnormal night sweats, no weight loss. Eyes: Denies blurriness of vision, double vision or watery eyes Ears, nose, mouth, throat, and face: Denies mucositis or sore throat Respiratory: Denies cough, dyspnea or wheezes  Cardiovascular: Denies palpitation, chest discomfort or lower extremity swelling Gastrointestinal:  Denies nausea, heartburn or change in bowel habits Skin: Denies abnormal skin rashes Lymphatics: Denies new lymphadenopathy or easy bruising Neurological:Denies numbness, tingling or new weaknesses Behavioral/Psych: Mood is stable, no new changes  All other systems were reviewed with the patient and are negative.  PHYSICAL EXAMINATION: ECOG PERFORMANCE STATUS: 0 - Asymptomatic BP 132/81 mmHg  Pulse 101  Temp(Src) 98.5 F (36.9 C) (Oral)  Resp 18  Ht 5' 6"  (1.676 m)  Wt 208 lb 6.4 oz (94.53 kg)  BMI 33.65 kg/m2  SpO2 99% GENERAL:alert, no distress and comfortable SKIN: skin color, texture, turgor are normal, no rashes or significant lesions EYES: normal, conjunctiva are pink and non-injected, sclera clear OROPHARYNX:no exudate, no erythema and lips, buccal mucosa, and tongue normal  NECK: supple, thyroid normal size, non-tender, without nodularity LYMPH:  no palpable lymphadenopathy in the cervical, axillary or inguinal LUNGS: clear  to auscultation and percussion with normal breathing effort HEART: regular rate & rhythm and no murmurs and no lower extremity edema ABDOMEN:abdomen soft, non-tender and normal bowel sounds Musculoskeletal:no cyanosis of digits and no clubbing  PSYCH: alert & oriented x 3 with fluent speech NEURO: no focal motor/sensory deficits  LABORATORY DATA:  I have reviewed the data as listed CBC Latest Ref Rng 07/13/2015 06/29/2015 06/08/2015  WBC 4.0 - 10.3 10e3/uL 5.2 5.3 5.0  Hemoglobin 13.0 - 17.1 g/dL 13.0 13.4 12.8(L)  Hematocrit 38.4 - 49.9 % 37.7(L) 40.2 38.0(L)  Platelets 140 - 400 10e3/uL 144 245 211     Recent Labs  03/15/15 0925 03/16/15 1710 04/19/15 1125  06/08/15 1214 06/29/15 1255 07/13/15 1301  NA 137 138 139  < > 139 140 139  K 4.0 4.0 3.8  < > 3.9 3.5 3.5  CL 105 103 107  --   --   --   --   CO2 24 26 24   < > 25 24 23   GLUCOSE 147* 149* 93  < > 114 138 143*  BUN 7 14 12   < > 8.1 7.6 5.7*  CREATININE 0.81 1.04 0.73  < > 0.9 0.8 0.8  CALCIUM 9.0 9.4 9.4  < > 9.4 9.2 9.1  GFRNONAA >60 >60 >60  --   --   --   --   GFRAA >60 >60 >60  --   --   --   --   PROT 6.6 7.2  --   < > 7.3 7.7 7.6  ALBUMIN 3.5 3.7  --   < > 3.7 3.7 3.6  AST 34 35  --   < > 27 35* 41*  ALT 39 45  --   < > 34 51 57*  ALKPHOS 82 81  --   < > 87 92 89  BILITOT 0.6 0.6  --   < > 0.54 0.46 0.55  < > = values in this interval not displayed.   PATHOLOGY REPORT  Diagnosis 03/30/2015 1. Colon, polyp(s), cecal colon polyp, excision - TUBULAR ADENOMA. NO HIGH GRADE DYSPLASIA OR MALIGNANCY IDENTIFIED. 2. Colon, biopsy, sigmoid mass - INVASIVE ADENOCARCINOMA, SEE COMMENT. Microscopic Comment 2. Dr. Saralyn Pilar reviewed this case and concurs. The results were discussed with Dr. Michail Sermon on 03/31/15. (BNS:ds 03/31/15)   RADIOGRAPHIC STUDIES: I have personally reviewed the radiological images as listed and agreed with the findings in the report.  Ct Chest, abdomen and pelvis W Contrast 04/03/2015      IMPRESSION: Nodular masslike area of colonic wall thickening  and rectosigmoid junction, consistent with known primary colon carcinoma.  Tiny pericolonic lymph nodes in sigmoid mesocolon measuring up to 6 mm, suspicious for local metastatic disease.  Diffuse hepatic steatosis.  No liver metastases identified.  Bilateral pulmonary nodules measuring approximately 1 cm, highly suspicious for pulmonary metastases.   Electronically Signed   By: Earle Gell M.D.   On: 04/03/2015 12:57   Ct Renal Stone Study 03/15/2015    IMPRESSION: 3 mm stone proximal right ureter causing partial obstruction of the right kidney  11 mm right lower lobe nodule. Differential includes benign and malignant nodules including metastatic disease. CT chest with contrast recommended for further evaluation. Consider performing contrast-enhanced CT abdomen pelvis at the time of the chest CT to evaluate for malignancy.  Sigmoid diverticulosis. Mucosal edema in the sigmoid colon could represent colonic neoplasm versus chronic diverticular change. This could be evaluated at CT abdomen pelvis with contrast. Endoscopy also recommended to exclude tumor.   Electronically Signed   By: Franchot Gallo M.D.   On: 03/15/2015 10:34   PET 04/12/2015 IMPRESSION: Hypermetabolic colonic mass near rectosigmoid junction, consistent with known primary colon carcinoma.  Tiny sub-cm pericolonic lymph nodes in sigmoid mesocolon are too small to characterize by PET, but are suspicious for early lymph node metastases.  Bilateral pulmonary metastases    ASSESSMENT & PLAN:  55 year old male, without significant past medical history except kidney stone, presented with intermittent bloody stool for 2 years, and colonoscopy showed a large sigmoid colon mass, CT scan showed multiple (at least 4) nodules in bilateral lungs, measuring about 1 cm.  1. Sigmoid colon adenocarcinoma, [id number], probably stage IV with lung mets  -I reviewed his colonoscopy, CT scan  findings and the biopsy results in great details with patient and his wife. -I personally reviewed his CT scan image with him, he has at least 4 lung nodules in bilateral lungs, measuring about 1 cm, highly suspicious for metastatic disease.  -I reviewed his PET CT scan image with patient and his wife in person. His multiple lung nodules are PET positive with max SUV 5.8. These are most consistent with metastatic disease. Unfortunately CT-guided lung nodule biopsy was attempted but unsuccessful. -His sigmoid colon mass is not obstructed, bleeding has been minimal, he does not need urgent surgery. But we did discuss the possibility of bowl obstruction, bleeding, ulceration, down the road and that he may need urgent surgery in that situation. -He is on first line chemo with Capeox, main side effect is his gastric pain.  -- His tumor does not contain KRAS/NRAS or BRAF mutation, so he would benefit from EGFR antibody, Panitumumab was added on from cycle 4.  -I also discussed switching his CAPEOX to FOLFOX or FOLFIRI to match the panitumumab scheudle, he declined for now. He does not want a port  -If he has excellent response to systemic chemotherapy, colon surgery to remove the primary tumor is also reasonable. - restaging CT scan is scheduled for next week. - due to his severe  Side effects from his last cycle chemotherapy (  Probably mainly from oxaliplatin),  I will hold occipital for at least a few cycles. Pt wants to have a break also, he  Agrees to continue Xeloda and Panitumumab   2. Gastric pain -secondary to oxaliplatin  -Initially responded to omeprazole, getting worse lately after chemo. -It's resolved since last week  3. Episode of dizziness,  Severe anorexia and fatigue - likely secondary to chemotherapy  And dehydration, especially oxaliplatin - he  has recovered very well - he knows to call us or come in to our office if he experiences similar episode of side effects.  Plan -2nd cycle  panitumumab today  And will continue every 2 weeks - we'll hold  oxaliplatin from next week (cycle 5),  And continue Xeloda 2 weeks on, one week off    I spent 20 minutes counseling the patient face to face. The total time spent in the appointment was 25 minutes and more than 50% was on counseling.     Truitt Merle, MD 07/13/2015 2:17 PM

## 2015-07-13 NOTE — Telephone Encounter (Signed)
Per 1/5 pof cx 1/12 tx. Other appointments remain the same. Patient given avs report and other appointments.

## 2015-07-13 NOTE — Addendum Note (Signed)
Addended by: Truitt Merle on: 07/13/2015 02:44 PM   Modules accepted: Orders

## 2015-07-14 LAB — CEA: CEA: <0.5 ng/mL (ref 0.0–5.0)

## 2015-07-18 ENCOUNTER — Encounter (HOSPITAL_COMMUNITY): Payer: Self-pay

## 2015-07-18 ENCOUNTER — Ambulatory Visit (HOSPITAL_COMMUNITY)
Admission: RE | Admit: 2015-07-18 | Discharge: 2015-07-18 | Disposition: A | Discharge status: Home / Self Care | Payer: BLUE CROSS/BLUE SHIELD | Source: Ambulatory Visit | Attending: Hematology | Admitting: Hematology

## 2015-07-18 DIAGNOSIS — K760 Fatty (change of) liver, not elsewhere classified: Secondary | ICD-10-CM | POA: Insufficient documentation

## 2015-07-18 DIAGNOSIS — C189 Malignant neoplasm of colon, unspecified: Secondary | ICD-10-CM | POA: Insufficient documentation

## 2015-07-18 DIAGNOSIS — C7800 Secondary malignant neoplasm of unspecified lung: Secondary | ICD-10-CM | POA: Diagnosis present

## 2015-07-18 DIAGNOSIS — Z9221 Personal history of antineoplastic chemotherapy: Secondary | ICD-10-CM | POA: Diagnosis not present

## 2015-07-18 DIAGNOSIS — N200 Calculus of kidney: Secondary | ICD-10-CM | POA: Insufficient documentation

## 2015-07-18 DIAGNOSIS — K5730 Diverticulosis of large intestine without perforation or abscess without bleeding: Secondary | ICD-10-CM | POA: Diagnosis not present

## 2015-07-18 MED ORDER — IOHEXOL 300 MG/ML  SOLN
100.0000 mL | Freq: Once | INTRAMUSCULAR | Status: AC | PRN
  Administered 2015-07-18: 100 mL via INTRAVENOUS

## 2015-07-20 ENCOUNTER — Encounter: Payer: Self-pay | Admitting: Hematology

## 2015-07-20 ENCOUNTER — Other Ambulatory Visit (HOSPITAL_BASED_OUTPATIENT_CLINIC_OR_DEPARTMENT_OTHER): Payer: BLUE CROSS/BLUE SHIELD

## 2015-07-20 ENCOUNTER — Ambulatory Visit (HOSPITAL_BASED_OUTPATIENT_CLINIC_OR_DEPARTMENT_OTHER): Payer: BLUE CROSS/BLUE SHIELD | Admitting: Hematology

## 2015-07-20 ENCOUNTER — Ambulatory Visit: Payer: BLUE CROSS/BLUE SHIELD

## 2015-07-20 ENCOUNTER — Telehealth: Payer: Self-pay | Admitting: Hematology and Oncology

## 2015-07-20 ENCOUNTER — Ambulatory Visit: Payer: BLUE CROSS/BLUE SHIELD | Admitting: Hematology

## 2015-07-20 ENCOUNTER — Other Ambulatory Visit: Payer: BLUE CROSS/BLUE SHIELD

## 2015-07-20 VITALS — BP 125/72 | HR 103 | Temp 98.3°F | Resp 18 | Ht 66.0 in | Wt 208.3 lb

## 2015-07-20 DIAGNOSIS — C7800 Secondary malignant neoplasm of unspecified lung: Secondary | ICD-10-CM

## 2015-07-20 DIAGNOSIS — C187 Malignant neoplasm of sigmoid colon: Secondary | ICD-10-CM | POA: Diagnosis not present

## 2015-07-20 DIAGNOSIS — L270 Generalized skin eruption due to drugs and medicaments taken internally: Secondary | ICD-10-CM

## 2015-07-20 DIAGNOSIS — C189 Malignant neoplasm of colon, unspecified: Secondary | ICD-10-CM

## 2015-07-20 DIAGNOSIS — R109 Unspecified abdominal pain: Secondary | ICD-10-CM | POA: Diagnosis not present

## 2015-07-20 LAB — CBC WITH DIFFERENTIAL/PLATELET
BASO%: 0.5 % (ref 0.0–2.0)
BASOS ABS: 0 10*3/uL (ref 0.0–0.1)
EOS ABS: 0.1 10*3/uL (ref 0.0–0.5)
EOS%: 1.7 % (ref 0.0–7.0)
HEMATOCRIT: 41.1 % (ref 38.4–49.9)
HGB: 13.7 g/dL (ref 13.0–17.1)
LYMPH#: 1.2 10*3/uL (ref 0.9–3.3)
LYMPH%: 27.1 % (ref 14.0–49.0)
MCH: 29.7 pg (ref 27.2–33.4)
MCHC: 33.4 g/dL (ref 32.0–36.0)
MCV: 89.1 fL (ref 79.3–98.0)
MONO#: 0.5 10*3/uL (ref 0.1–0.9)
MONO%: 11.9 % (ref 0.0–14.0)
NEUT#: 2.6 10*3/uL (ref 1.5–6.5)
NEUT%: 58.8 % (ref 39.0–75.0)
PLATELETS: 222 10*3/uL (ref 140–400)
RBC: 4.61 10*6/uL (ref 4.20–5.82)
RDW: 18.1 % — ABNORMAL HIGH (ref 11.0–14.6)
WBC: 4.5 10*3/uL (ref 4.0–10.3)

## 2015-07-20 LAB — COMPREHENSIVE METABOLIC PANEL
ALBUMIN: 3.5 g/dL (ref 3.5–5.0)
ALK PHOS: 96 U/L (ref 40–150)
ALT: 60 U/L — ABNORMAL HIGH (ref 0–55)
ANION GAP: 12 meq/L — AB (ref 3–11)
AST: 41 U/L — AB (ref 5–34)
BUN: 6.9 mg/dL — AB (ref 7.0–26.0)
CALCIUM: 9 mg/dL (ref 8.4–10.4)
CO2: 21 mEq/L — ABNORMAL LOW (ref 22–29)
Chloride: 106 mEq/L (ref 98–109)
Creatinine: 0.8 mg/dL (ref 0.7–1.3)
EGFR: >90 mL/min/{1.73_m2} (ref >90)
Glucose: 164 mg/dl — ABNORMAL HIGH (ref 70–140)
POTASSIUM: 3.7 meq/L (ref 3.5–5.1)
Sodium: 139 mEq/L (ref 136–145)
Total Bilirubin: 0.45 mg/dL (ref 0.20–1.20)
Total Protein: 7.5 g/dL (ref 6.4–8.3)

## 2015-07-20 LAB — MAGNESIUM: Magnesium: 2 mg/dl (ref 1.5–2.5)

## 2015-07-20 NOTE — Progress Notes (Signed)
[place]  [phone] [phone]  Clinic Follow Up Note   Patient Care Team: Dibas Koirala, MD as PCP - General (Family Medicine) Wilford Corner, MD as Consulting Physician (Gastroenterology) Jackolyn Confer, MD as Consulting Physician (General Surgery) Truitt Merle, MD as Consulting Physician (Hematology) 07/20/2015  CHIEF COMPLAINTS:  Follow Up colon cancer  Oncology History   Colon cancer metastasized to lung [facility])   Staging form: Colon and Rectum, AJCC 7th Edition     Clinical stage from 03/30/2015: Stage Unknown ([place], N1, M1) - Unsigned       Colon cancer metastasized to lung ([place])   03/30/2015 Initial Biopsy Sigmoid mass biopsy showed invasive adenocarcinoma. Cecal colon polyps showed tubular adenoma.   03/30/2015 Initial Diagnosis Colon cancer   03/30/2015 Procedure colonoscopy by Dr. Michail Sermon showed a fungating, infiltrative and ulcerated nonobstructing large mass in the sigmoid colon and at 20 cm proximal to the anus. The mass was partially circumferential no bleeding. A 10 mm polyps in the cecum was removed.   04/03/2015 Imaging CT chest, abdomen and pelvis with contrast showed nodular masslike area of clinical worsening at rectosigmoid junction, tiny pericolonic lymph nodes, bilateral pulmonary nodules measuring about 1 cm.   04/12/2015 PET scan Hypermetabolic colonic mass near rectosigmoid junction, tiny subcentimeter paracolonic lymph nodes. Bilateral pulmonary metastasis.   04/19/2015 Procedure CT-guided lung nodule biopsy attempted, unsuccessful.   04/27/2015 -  Chemotherapy Oxaliplatin 130 mg/m on day 1, Capecitabine 2355m q12hr, 2 weeks on and one week off (only received 7 days for first cycle)    HISTORY OF PRESENTING ILLNESS:  Bill [id number].o. male is here because of recently newly diagnosed colon cancer.  He has had intermittent bloody stool for 2 years, it has been mild, mixed with stool, patient does not have any  abdominal pain, constipation, change of his bowel habits, nausea, weight loss or other symptoms. He did not seek medical attention for this. He went to emergency room on 03/15/2015 for right flank pain, due to his kidney stone. CT scan incidentally found a 11 mm right lower lobe nodule and mucosal edema in the sigmoid colon. He saw his primary care physician, and was referred to GI Dr. SMichail Sermonhere at he underwent colonoscopy on 03/30/2015, which showed a fungating infiltrative and ulcerated nonobstructing large mass in the sigmoid colon, biopsy showed adenocarcinoma. CT chest abdomen and pelvis showed multiple lung nodules measuring about 1 cm. He was referred to surgeon Dr. RZella Richer who referred patient to [place] further [place] of his lung nodule and discuss chemotherapy.  He feels very well overall, denies any symptoms. He is a [employer] lives with his wife and 3 children. He never had screening colonoscopy prior the reason one, no significant past medical history, does not see doctors regularly.  CURRENT TREATMENT: Oxaliplatin 130 mg/m on day 1, Capecitabine 23059mq12hr, D1-14, every 21 days, added Panitumumab every 2 weeks from cycle 4  INTERIM HISTORY EdCadoneturns for follow-up and discuss restaging scan result.  He tolerated the second cycle panitumumab well last week, mild skin rash that is stable. No other noticeable side effects. He denies any gastric discomfort or other complaints in the past few weeks. He is accompanied by his wife to clinic today.  MEDICAL HISTORY:  Past Medical History  Diagnosis Date  . Kidney calculi   . Hypercholesteremia     SURGICAL HISTORY: Past Surgical History  Procedure Laterality Date  . Colonoscopy  03/30/15  SOCIAL HISTORY: Social History   Social History  . Marital Status: Married    Spouse Name: N/A  . Number of Children: 3, age of 62, 85 and 15   . Years of Education: N/A   Occupational History  . 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 Smoking status: Never Smoker   . Smokeless tobacco: Not on file  . Alcohol Use: No  . Drug Use: No  . Sexual Activity: Not on file   Other Topics Concern  . Not on file   Social History Narrative    FAMILY HISTORY: Family History  Problem Relation Age of Onset  . Cancer Mother 38    breast cancer   . Diabetes Maternal Grandmother     ALLERGIES:  has No Known Allergies.  MEDICATIONS:  Current Outpatient Prescriptions  Medication Sig Dispense Refill  . acetaminophen (TYLENOL) 500 MG tablet Take 500 mg by mouth every 6 (six) hours as needed.    . capecitabine (XELODA) 150 MG tablet Take 1 tablet (150 mg total) by mouth 2 (two) times daily after a meal. Take on days 1-14 of chemotherapy. 28 tablet 3  . capecitabine (XELODA) 500 MG tablet Take 4 tablets (2,000 mg total) by mouth 2 (two) times daily after a meal. Take on days 1-14 of chemotherapy. 112 tablet 3  . clindamycin (CLINDAGEL) 1 % gel Apply topically 2 (two) times daily. 30 g 3  . hydrocortisone 2.5 % lotion Apply topically 2 (two) times daily. 59 mL 3  . loperamide (IMODIUM A-D) 2 MG tablet Take 4 mg by mouth daily as needed for diarrhea or loose stools.    Marland Kitchen omeprazole (PRILOSEC) 20 MG capsule Take 1 capsule (20 mg total) by mouth 2 (two) times daily before a meal. 60 capsule 3  . ondansetron (ZOFRAN) 8 MG tablet Take 1 tablet (8 mg total) by mouth 2 (two) times daily. Start the day after chemo for 2 days. Then take as needed for nausea or vomiting. 30 tablet 1  . ranitidine (ZANTAC) 150 MG tablet Take 150 mg by mouth daily.    . sucralfate (CARAFATE) 1 G tablet Take 1 tablet (1 g total) by mouth 4 (four) times daily -  with meals and at bedtime. 60 tablet 1  . traMADol (ULTRAM) 50 MG tablet Take 1 tablet (50 mg total) by mouth every 8 (eight) hours as needed. 30 tablet 1   No current facility-administered medications for this visit.    REVIEW OF SYSTEMS:   Constitutional:  Denies fevers, chills or abnormal night sweats, no weight loss. Eyes: Denies blurriness of vision, double vision or watery eyes Ears, nose, mouth, throat, and face: Denies mucositis or sore throat Respiratory: Denies cough, dyspnea or wheezes Cardiovascular: Denies palpitation, chest discomfort or lower extremity swelling Gastrointestinal:  Denies nausea, heartburn or change in bowel habits Skin: Denies abnormal skin rashes Lymphatics: Denies new lymphadenopathy or easy bruising Neurological:Denies numbness, tingling or new weaknesses Behavioral/Psych: Mood is stable, no new changes  All other systems were reviewed with the patient and are negative.  PHYSICAL EXAMINATION: ECOG PERFORMANCE STATUS: 0 - Asymptomatic BP 125/72 mmHg  Pulse 103  Temp(Src) 98.3 F (36.8 C) (Oral)  Resp 18  Ht _0  (1.676 m)  Wt 208 lb 4.8 oz (94.484 kg)  BMI 33.64 kg/m2  SpO2 98% GENERAL:alert, no distress and comfortable SKIN: skin color, texture, turgor are normal, no rashes or significant lesions EYES: normal, conjunctiva are pink  and non-injected, sclera clear OROPHARYNX:no exudate, no erythema and lips, buccal mucosa, and tongue normal  NECK: supple, thyroid normal size, non-tender, without nodularity LYMPH:  no palpable lymphadenopathy in the cervical, axillary or inguinal LUNGS: clear to auscultation and percussion with normal breathing effort HEART: regular rate & rhythm and no murmurs and no lower extremity edema ABDOMEN:abdomen soft, non-tender and normal bowel sounds Musculoskeletal:no cyanosis of digits and no clubbing  PSYCH: alert & oriented x 3 with fluent speech NEURO: no focal motor/sensory deficits  LABORATORY DATA:  I have reviewed the data as listed CBC Latest Ref Rng 07/20/2015 07/13/2015 06/29/2015  WBC 4.0 - 10.3 10e3/uL 4.5 5.2 5.3  Hemoglobin 13.0 - 17.1 g/dL 13.7 13.0 13.4  Hematocrit 38.4 - 49.9 % 41.1 37.7(L) 40.2  Platelets 140 - 400 10e3/uL 222 144 245     Recent  Labs  03/15/15 0925 03/16/15 1710 04/19/15 1125  06/29/15 1255 07/13/15 1301 07/20/15 0810  NA 137 138 139  < > 140 139 139  K 4.0 4.0 3.8  < > 3.5 3.5 3.7  CL 105 103 107  --   --   --   --   CO2 _0 < > 24 23 21*  GLUCOSE 147* 149* 93  < > 138 143* 164*  BUN _1 < > 7.6 5.7* 6.9*  CREATININE 0.81 1.04 0.73  < > 0.8 0.8 0.8  CALCIUM 9.0 9.4 9.4  < > 9.2 9.1 9.0  GFRNONAA >60 >60 >60  --   --   --   --   GFRAA >60 >60 >60  --   --   --   --   PROT 6.6 7.2  --   < > 7.7 7.6 7.5  ALBUMIN 3.5 3.7  --   < > 3.7 3.6 3.5  AST 34 35  --   < > 35* 41* 41*  ALT 39 45  --   < > 51 57* 60*  ALKPHOS 82 81  --   < > 92 89 96  BILITOT 0.6 0.6  --   < > 0.46 0.55 0.45  < > = values in this interval not displayed.   PATHOLOGY REPORT  Diagnosis 03/30/2015 1. Colon, polyp(s), cecal colon polyp, excision - TUBULAR ADENOMA. NO HIGH GRADE DYSPLASIA OR MALIGNANCY IDENTIFIED. 2. Colon, biopsy, sigmoid mass - INVASIVE ADENOCARCINOMA, SEE COMMENT. Microscopic Comment 2. Dr. Saralyn Pilar reviewed this case and concurs. The results were discussed with Dr. Michail Sermon on 03/31/15. (BNS:ds 03/31/15)   RADIOGRAPHIC STUDIES: I have personally reviewed the radiological images as listed and agreed with the findings in the report.  PET 04/12/2015 IMPRESSION: Hypermetabolic colonic mass near rectosigmoid junction, consistent with known primary colon carcinoma.  Tiny sub-cm pericolonic lymph nodes in sigmoid mesocolon are too small to characterize by PET, but are suspicious for early lymph node metastases.  Bilateral pulmonary metastases  CT chest, abdomen and pelvis with contrast 07/18/2015 IMPRESSION: 1. Mildly reduced size of the pulmonary nodules. 2. Reduce conspicuity of the rectosigmoid mass, although some of this may be due to collapse of the surrounding colon. Adjacent sigmoid diverticulosis noted. 3. No new adenopathy. 4. There is some faint stranding in the mesentery which  is nonspecific. No adenopathy. 5. Stable mild cardiomegaly. 6. Diffuse hepatic steatosis. 7. Nonobstructive nephrolithiasis.   ASSESSMENT & PLAN:  55 year old male, without significant past medical history except kidney stone, presented with intermittent bloody stool for 2 years, and colonoscopy showed a large sigmoid  colon mass, CT scan showed multiple (at least 4) nodules in bilateral lungs, measuring about 1 cm.  1. Sigmoid colon adenocarcinoma, [id number], probably stage IV with lung mets  -I previously reviewed his colonoscopy, CT scan findings and the biopsy results in great details with patient and his wife. -I personally reviewed his CT scan image with him, he has at least 4 lung nodules in bilateral lungs, measuring about 1 cm, highly suspicious for metastatic disease.  -I reviewed his PET CT scan image with patient and his wife in person. His multiple lung nodules are PET positive with max SUV 5.8. These are most consistent with metastatic disease. Unfortunately CT-guided lung nodule biopsy was attempted but unsuccessful. -His sigmoid colon mass is not obstructed, bleeding has been minimal, he does not need urgent surgery. But we did discuss the possibility of bowl obstruction, bleeding, ulceration, down the road and that he may need urgent surgery in that situation. -He is on first line chemo with Capeox, main side effect is his gastric pain.  -- His tumor does not contain KRAS/NRAS or BRAF mutation, so he would benefit from EGFR antibody, Panitumumab was added on from cycle 4, tolerating well  -I reviewed his restaging CT scan images from 07/18/2015 personally with patient and his wife. His 4 predominant pulmonary metastasis lesions has moderately decreased, no new lesions. He has had a partial response, we'll continue chemotherapy. -The patient is quite adamant about his colon surgery. I recommend continue chemotherapy for 2 more months, and repeat with a PET scan, then I'll present his  case in the tumor board to see if colon surgery can be offered.  - due to his severe  Side effects from his last cycle chemotherapy (  Probably mainly from oxaliplatin),  I will hold  oxaliplatin for this cycle, and dose reduction from next cycle. He will continue Xeloda and Panitumumab   2. Gastric pain -secondary to oxaliplatin  -Initially responded to omeprazole, getting worse lately after chemo. -It's resolved lately   3. Skin rashes, G1 -Secondary to panitumumab -Continue topical steroids and clindamycin gel, avoid sun exposure.  Plan -start cycle 5 CAPEOX but hold oxaliplatin this cycle -continue panitumumab every 3 weeks, cycle 3 next week  -I'll see him in 3 weeks before cycle 6 Capeox, we'll reduce oxaliplatin dose on next cycle   I spent 30 minutes counseling the patient face to face. The total time spent in the appointment was 40 minutes and more than 50% was on counseling.     Truitt Merle, MD 07/20/2015

## 2015-07-20 NOTE — Telephone Encounter (Signed)
Gave patient avs report and appointments for January and February. Per patient he cannot have vect/oxal on the same day which is in line with YF's instructions per 1/12 pof. Message to desk nurse - patient aware. Appointments scheduled as requested per pof.

## 2015-07-25 ENCOUNTER — Other Ambulatory Visit: Payer: Self-pay | Admitting: *Deleted

## 2015-07-25 ENCOUNTER — Telehealth: Payer: Self-pay | Admitting: *Deleted

## 2015-07-25 NOTE — Telephone Encounter (Signed)
Spoke with Theadora Rama, pharmacist and was informed that pt needs CBC repeated on 08/17/15 prior to Vectibix chemo.   Pt will have CMET , Magnesium   done on 08/10/15. POF sent to scheduler.

## 2015-07-25 NOTE — Telephone Encounter (Signed)
Thu,  OK to reschedule his Vectibix a week after his oxaliplatin. Please arrange. Thanks.  Truitt Merle  07/25/2015

## 2015-07-25 NOTE — Telephone Encounter (Signed)
"  I'm calling to let Dr. Burr Medico know for the 08-10-2015 appointment scheduled for both Vectibix and Oxaliplatin I cannot receive both of these medicines together.  My body does not tolerate these well when given together.  We discussed this previously about the severe reaction I had days after receiving this treatment."

## 2015-07-26 ENCOUNTER — Telehealth: Payer: Self-pay | Admitting: Hematology

## 2015-07-26 NOTE — Telephone Encounter (Signed)
2/9 added per pof and patient will get a new schedule on 1/19

## 2015-07-27 ENCOUNTER — Ambulatory Visit: Payer: BLUE CROSS/BLUE SHIELD | Admitting: Hematology

## 2015-07-27 ENCOUNTER — Ambulatory Visit (HOSPITAL_BASED_OUTPATIENT_CLINIC_OR_DEPARTMENT_OTHER): Payer: BLUE CROSS/BLUE SHIELD

## 2015-07-27 ENCOUNTER — Other Ambulatory Visit: Payer: BLUE CROSS/BLUE SHIELD

## 2015-07-27 ENCOUNTER — Other Ambulatory Visit (HOSPITAL_BASED_OUTPATIENT_CLINIC_OR_DEPARTMENT_OTHER): Payer: BLUE CROSS/BLUE SHIELD

## 2015-07-27 VITALS — BP 132/73 | HR 98 | Temp 98.7°F | Resp 18

## 2015-07-27 DIAGNOSIS — C187 Malignant neoplasm of sigmoid colon: Secondary | ICD-10-CM

## 2015-07-27 DIAGNOSIS — C7801 Secondary malignant neoplasm of right lung: Secondary | ICD-10-CM

## 2015-07-27 DIAGNOSIS — C189 Malignant neoplasm of colon, unspecified: Secondary | ICD-10-CM

## 2015-07-27 DIAGNOSIS — C7802 Secondary malignant neoplasm of left lung: Secondary | ICD-10-CM | POA: Diagnosis not present

## 2015-07-27 DIAGNOSIS — C7800 Secondary malignant neoplasm of unspecified lung: Principal | ICD-10-CM

## 2015-07-27 DIAGNOSIS — Z5112 Encounter for antineoplastic immunotherapy: Secondary | ICD-10-CM

## 2015-07-27 LAB — COMPREHENSIVE METABOLIC PANEL
ALK PHOS: 103 U/L (ref 40–150)
ALT: 49 U/L (ref 0–55)
AST: 36 U/L — AB (ref 5–34)
Albumin: 3.6 g/dL (ref 3.5–5.0)
Anion Gap: 10 mEq/L (ref 3–11)
BUN: 6.9 mg/dL — AB (ref 7.0–26.0)
CHLORIDE: 102 meq/L (ref 98–109)
CO2: 25 mEq/L (ref 22–29)
Calcium: 8.9 mg/dL (ref 8.4–10.4)
Creatinine: 0.8 mg/dL (ref 0.7–1.3)
GLUCOSE: 150 mg/dL — AB (ref 70–140)
POTASSIUM: 3.7 meq/L (ref 3.5–5.1)
SODIUM: 136 meq/L (ref 136–145)
Total Bilirubin: 0.49 mg/dL (ref 0.20–1.20)
Total Protein: 7.6 g/dL (ref 6.4–8.3)

## 2015-07-27 LAB — CBC WITH DIFFERENTIAL/PLATELET
BASO%: 0.7 % (ref 0.0–2.0)
BASOS ABS: 0 10*3/uL (ref 0.0–0.1)
EOS ABS: 0.1 10*3/uL (ref 0.0–0.5)
EOS%: 1 % (ref 0.0–7.0)
HCT: 40.6 % (ref 38.4–49.9)
HGB: 13.5 g/dL (ref 13.0–17.1)
LYMPH%: 30.4 % (ref 14.0–49.0)
MCH: 29.7 pg (ref 27.2–33.4)
MCHC: 33.1 g/dL (ref 32.0–36.0)
MCV: 89.6 fL (ref 79.3–98.0)
MONO#: 0.7 10*3/uL (ref 0.1–0.9)
MONO%: 10.9 % (ref 0.0–14.0)
NEUT#: 3.5 10*3/uL (ref 1.5–6.5)
NEUT%: 57 % (ref 39.0–75.0)
Platelets: 205 10*3/uL (ref 140–400)
RBC: 4.54 10*6/uL (ref 4.20–5.82)
RDW: 17.5 % — AB (ref 11.0–14.6)
WBC: 6.1 10*3/uL (ref 4.0–10.3)
lymph#: 1.9 10*3/uL (ref 0.9–3.3)

## 2015-07-27 MED ORDER — SODIUM CHLORIDE 0.9 % IV SOLN
Freq: Once | INTRAVENOUS | Status: AC
  Administered 2015-07-27: 14:00:00 via INTRAVENOUS

## 2015-07-27 MED ORDER — PANITUMUMAB CHEMO INJECTION 100 MG/5ML
6.0000 mg/kg | Freq: Once | INTRAVENOUS | Status: AC
  Administered 2015-07-27: 600 mg via INTRAVENOUS
  Filled 2015-07-27: qty 10

## 2015-07-27 NOTE — Progress Notes (Signed)
Reviewed orders with Dr. Burr Medico. Pt to receive Vectibix only today.

## 2015-07-27 NOTE — Patient Instructions (Signed)
[facility] Discharge Instructions for Patients Receiving Chemotherapy  Today you received the following chemotherapy agents Vectibix  To help prevent nausea and vomiting after your treatment, we encourage you to take your nausea medication as directed.   If you develop nausea and vomiting that is not controlled by your nausea medication, call the clinic.   BELOW ARE SYMPTOMS THAT SHOULD BE REPORTED IMMEDIATELY:  *FEVER GREATER THAN 100.5 F  *CHILLS WITH OR WITHOUT FEVER  NAUSEA AND VOMITING THAT IS NOT CONTROLLED WITH YOUR NAUSEA MEDICATION  *UNUSUAL SHORTNESS OF BREATH  *UNUSUAL BRUISING OR BLEEDING  TENDERNESS IN MOUTH AND THROAT WITH OR WITHOUT PRESENCE OF ULCERS  *URINARY PROBLEMS  *BOWEL PROBLEMS  UNUSUAL RASH Items with * indicate a potential emergency and should be followed up as soon as possible.  Feel free to call the clinic you have any questions or concerns. The clinic phone number is [phone].  Please show the CHEMO ALERT CARD at check-in to the Emergency Department and triage nurse.   

## 2015-08-10 ENCOUNTER — Ambulatory Visit (HOSPITAL_BASED_OUTPATIENT_CLINIC_OR_DEPARTMENT_OTHER): Payer: BLUE CROSS/BLUE SHIELD

## 2015-08-10 ENCOUNTER — Other Ambulatory Visit (HOSPITAL_BASED_OUTPATIENT_CLINIC_OR_DEPARTMENT_OTHER): Payer: BLUE CROSS/BLUE SHIELD

## 2015-08-10 ENCOUNTER — Ambulatory Visit (HOSPITAL_BASED_OUTPATIENT_CLINIC_OR_DEPARTMENT_OTHER): Payer: BLUE CROSS/BLUE SHIELD | Admitting: Hematology

## 2015-08-10 ENCOUNTER — Encounter: Payer: Self-pay | Admitting: Hematology

## 2015-08-10 ENCOUNTER — Other Ambulatory Visit: Payer: Self-pay | Admitting: Hematology

## 2015-08-10 ENCOUNTER — Encounter: Payer: Self-pay | Admitting: *Deleted

## 2015-08-10 VITALS — BP 138/84 | HR 106 | Temp 98.2°F | Resp 19 | Ht 66.0 in | Wt 208.5 lb

## 2015-08-10 DIAGNOSIS — C7801 Secondary malignant neoplasm of right lung: Secondary | ICD-10-CM | POA: Diagnosis not present

## 2015-08-10 DIAGNOSIS — C187 Malignant neoplasm of sigmoid colon: Secondary | ICD-10-CM

## 2015-08-10 DIAGNOSIS — R21 Rash and other nonspecific skin eruption: Secondary | ICD-10-CM

## 2015-08-10 DIAGNOSIS — C7800 Secondary malignant neoplasm of unspecified lung: Principal | ICD-10-CM

## 2015-08-10 DIAGNOSIS — C7802 Secondary malignant neoplasm of left lung: Secondary | ICD-10-CM | POA: Diagnosis not present

## 2015-08-10 DIAGNOSIS — Z5111 Encounter for antineoplastic chemotherapy: Secondary | ICD-10-CM | POA: Diagnosis not present

## 2015-08-10 DIAGNOSIS — C189 Malignant neoplasm of colon, unspecified: Secondary | ICD-10-CM

## 2015-08-10 DIAGNOSIS — R1013 Epigastric pain: Secondary | ICD-10-CM

## 2015-08-10 LAB — COMPREHENSIVE METABOLIC PANEL
ALT: 48 U/L (ref 0–55)
AST: 36 U/L — AB (ref 5–34)
Albumin: 3.8 g/dL (ref 3.5–5.0)
Alkaline Phosphatase: 97 U/L (ref 40–150)
Anion Gap: 13 mEq/L — ABNORMAL HIGH (ref 3–11)
BUN: 6.3 mg/dL — AB (ref 7.0–26.0)
CALCIUM: 9.3 mg/dL (ref 8.4–10.4)
CHLORIDE: 105 meq/L (ref 98–109)
CO2: 22 mEq/L (ref 22–29)
Creatinine: 0.8 mg/dL (ref 0.7–1.3)
EGFR: >90 mL/min/{1.73_m2} (ref >90)
GLUCOSE: 171 mg/dL — AB (ref 70–140)
POTASSIUM: 3.7 meq/L (ref 3.5–5.1)
SODIUM: 140 meq/L (ref 136–145)
Total Bilirubin: 0.64 mg/dL (ref 0.20–1.20)
Total Protein: 7.9 g/dL (ref 6.4–8.3)

## 2015-08-10 LAB — CBC WITH DIFFERENTIAL/PLATELET
BASO%: 0.3 % (ref 0.0–2.0)
Basophils Absolute: 0 10*3/uL (ref 0.0–0.1)
EOS%: 0.8 % (ref 0.0–7.0)
Eosinophils Absolute: 0.1 10*3/uL (ref 0.0–0.5)
HCT: 40.9 % (ref 38.4–49.9)
HGB: 14.1 g/dL (ref 13.0–17.1)
LYMPH%: 21.2 % (ref 14.0–49.0)
MCH: 30.7 pg (ref 27.2–33.4)
MCHC: 34.5 g/dL (ref 32.0–36.0)
MCV: 89.1 fL (ref 79.3–98.0)
MONO#: 0.6 10*3/uL (ref 0.1–0.9)
MONO%: 8.6 % (ref 0.0–14.0)
NEUT#: 4.9 10*3/uL (ref 1.5–6.5)
NEUT%: 69.1 % (ref 39.0–75.0)
Platelets: 255 10*3/uL (ref 140–400)
RBC: 4.59 10*6/uL (ref 4.20–5.82)
RDW: 14.8 % — ABNORMAL HIGH (ref 11.0–14.6)
WBC: 7.1 10*3/uL (ref 4.0–10.3)
lymph#: 1.5 10*3/uL (ref 0.9–3.3)

## 2015-08-10 LAB — MAGNESIUM: Magnesium: 2.1 mg/dl (ref 1.5–2.5)

## 2015-08-10 MED ORDER — DEXTROSE 5 % IV SOLN
Freq: Once | INTRAVENOUS | Status: AC
  Administered 2015-08-10: 14:00:00 via INTRAVENOUS

## 2015-08-10 MED ORDER — SODIUM CHLORIDE 0.9 % IV SOLN
Freq: Once | INTRAVENOUS | Status: AC
  Administered 2015-08-10: 15:00:00 via INTRAVENOUS
  Filled 2015-08-10: qty 4

## 2015-08-10 MED ORDER — CLINDAMYCIN PHOSPHATE 1 % EX GEL: Freq: Two times a day (BID) | CUTANEOUS | Status: DC

## 2015-08-10 MED ORDER — OXALIPLATIN CHEMO INJECTION 100 MG/20ML
100.0000 mg/m2 | Freq: Once | INTRAVENOUS | Status: AC
  Administered 2015-08-10: 220 mg via INTRAVENOUS
  Filled 2015-08-10: qty 34

## 2015-08-10 MED ORDER — CAPECITABINE 500 MG PO TABS: 2000.0000 mg | ORAL_TABLET | Freq: Two times a day (BID) | ORAL | Status: DC

## 2015-08-10 MED ORDER — HYDROCORTISONE 2.5 % EX LOTN: TOPICAL_LOTION | Freq: Two times a day (BID) | CUTANEOUS | Status: DC

## 2015-08-10 NOTE — Patient Instructions (Signed)
[facility] Discharge Instructions for Patients Receiving Chemotherapy  Today you received the following chemotherapy agents Oxaliplatin  To help prevent nausea and vomiting after your treatment, we encourage you to take your nausea medication as directed.   If you develop nausea and vomiting that is not controlled by your nausea medication, call the clinic.   BELOW ARE SYMPTOMS THAT SHOULD BE REPORTED IMMEDIATELY:  *FEVER GREATER THAN 100.5 F  *CHILLS WITH OR WITHOUT FEVER  NAUSEA AND VOMITING THAT IS NOT CONTROLLED WITH YOUR NAUSEA MEDICATION  *UNUSUAL SHORTNESS OF BREATH  *UNUSUAL BRUISING OR BLEEDING  TENDERNESS IN MOUTH AND THROAT WITH OR WITHOUT PRESENCE OF ULCERS  *URINARY PROBLEMS  *BOWEL PROBLEMS  UNUSUAL RASH Items with * indicate a potential emergency and should be followed up as soon as possible.  Feel free to call the clinic you have any questions or concerns. The clinic phone number is [phone].  Please show the CHEMO ALERT CARD at check-in to the Emergency Department and triage nurse.    

## 2015-08-10 NOTE — Progress Notes (Signed)
Bill Wright  [phone] [phone]  Clinic Follow Up Note   Patient Care Team: Dibas Koirala, MD as PCP - General (Family Medicine) Wilford Corner, MD as Consulting Physician (Gastroenterology) Jackolyn Confer, MD as Consulting Physician (General Surgery) Truitt Merle, MD as Consulting Physician (Hematology) 08/10/2015  CHIEF COMPLAINTS:  Follow Up colon cancer  Oncology History   Colon cancer metastasized to lung [facility])   Staging form: Colon and Rectum, AJCC 7th Edition     Clinical stage from 03/30/2015: Stage Unknown ([place], N1, M1) - Unsigned       Colon cancer metastasized to lung ([place])   03/30/2015 Initial Biopsy Sigmoid mass biopsy showed invasive adenocarcinoma. Cecal colon polyps showed tubular adenoma.   03/30/2015 Initial Diagnosis Colon cancer   03/30/2015 Procedure colonoscopy by Dr. Michail Sermon showed a fungating, infiltrative and ulcerated nonobstructing large mass in the sigmoid colon and at 20 cm proximal to the anus. The mass was partially circumferential no bleeding. A 10 mm polyps in the cecum was removed.   04/03/2015 Imaging CT chest, abdomen and pelvis with contrast showed nodular masslike area of clinical worsening at rectosigmoid junction, tiny pericolonic lymph nodes, bilateral pulmonary nodules measuring about 1 cm.   04/12/2015 PET scan Hypermetabolic colonic mass near rectosigmoid junction, tiny subcentimeter paracolonic lymph nodes. Bilateral pulmonary metastasis.   04/19/2015 Procedure CT-guided lung nodule biopsy attempted, unsuccessful.   04/27/2015 -  Chemotherapy Oxaliplatin 130 mg/m on day 1, Capecitabine 231m q12hr, 2 weeks on and one week off (only received 7 days for first cycle)    HISTORY OF PRESENTING ILLNESS:  Bill Lohmeyer55y.o. male is here because of recently newly diagnosed colon cancer.  He has had intermittent bloody stool for 2 years, it has been mild, mixed with stool, patient does not have any abdominal  pain, constipation, change of his bowel habits, nausea, weight loss or other symptoms. He did not seek medical attention for this. He went to emergency room on 03/15/2015 for right flank pain, due to his kidney stone. CT scan incidentally found a 11 mm right lower lobe nodule and mucosal edema in the sigmoid colon. He saw his primary care physician, and was referred to GI Dr. SMichail Sermonhere at he underwent colonoscopy on 03/30/2015, which showed a fungating infiltrative and ulcerated nonobstructing large mass in the sigmoid colon, biopsy showed adenocarcinoma. CT chest abdomen and pelvis showed multiple lung nodules measuring about 1 cm. He was referred to surgeon Dr. RZella Richer who referred patient to [place] further [place] of his lung nodule and discuss chemotherapy.  He feels very well overall, denies any symptoms. He is a [employer]ight [employer] lives with his wife and 3 children. He never had screening colonoscopy prior the reason one, no significant past medical history, does not see doctors regularly.  CURRENT TREATMENT: Oxaliplatin 130 mg/m on day 1, Capecitabine 23038mq12hr, D1-14, every 21 days, added Panitumumab every 2 weeks on 06/29/2015, oxaliplatin held on cycle 5 and dose reduced to 10030m2, capecitabine reduced to 2000m28m2h D1-14, from cycle 6 on 08/10/2015  INTERIM HISTORY Bill Wright for follow-up and chemotherapy. He is doing well overall. He tolerated the last cycle chemo with Xeloda alone very well, no abdominal discomfort, nausea or diarrhea. He has mild to moderate rash on neck, and upper chest, manageable. He still has the taste change, appetite is good and eats well. No other complains.    MEDICAL HISTORY:  Past Medical History  Diagnosis Date  . Kidney calculi   .  Hypercholesteremia     SURGICAL HISTORY: Past Surgical History  Procedure Laterality Date  . Colonoscopy  03/30/15    SOCIAL HISTORY: Social History   Social History  . Marital Status: Married      Spouse Name: N/A  . Number of Children: 3, age of 54, 24 and 78   . Years of Education: N/A   Occupational History  . Banker for [employer] [place]    Social History Main Topics  . Smoking status: Never Smoker   . Smokeless tobacco: Not on file  . Alcohol Use: No  . Drug Use: No  . Sexual Activity: Not on file   Other Topics Concern  . Not on file   Social History Narrative    FAMILY HISTORY: Family History  Problem Relation Age of Onset  . Cancer Mother 71    breast cancer   . Diabetes Maternal Grandmother     ALLERGIES:  has No Known Allergies.  MEDICATIONS:  Current Outpatient Prescriptions  Medication Sig Dispense Refill  . acetaminophen (TYLENOL) 500 MG tablet Take 500 mg by mouth every 6 (six) hours as needed.    . capecitabine (XELODA) 150 MG tablet Take 1 tablet (150 mg total) by mouth 2 (two) times daily after a meal. Take on days 1-14 of chemotherapy. 28 tablet 3  . capecitabine (XELODA) 500 MG tablet Take 4 tablets (2,000 mg total) by mouth 2 (two) times daily after a meal. Take on days 1-14 of chemotherapy. 112 tablet 3  . clindamycin (CLINDAGEL) 1 % gel Apply topically 2 (two) times daily. 30 g 5  . hydrocortisone 2.5 % lotion Apply topically 2 (two) times daily. 59 mL 5  . loperamide (IMODIUM A-D) 2 MG tablet Take 4 mg by mouth daily as needed for diarrhea or loose stools.    Marland Kitchen omeprazole (PRILOSEC) 20 MG capsule Take 1 capsule (20 mg total) by mouth 2 (two) times daily before a meal. 60 capsule 3  . ondansetron (ZOFRAN) 8 MG tablet Take 1 tablet (8 mg total) by mouth 2 (two) times daily. Start the day after chemo for 2 days. Then take as needed for nausea or vomiting. 30 tablet 1  . ranitidine (ZANTAC) 150 MG tablet Take 150 mg by mouth daily.    . sucralfate (CARAFATE) 1 G tablet Take 1 tablet (1 g total) by mouth 4 (four) times daily -  with meals and at bedtime. 60 tablet 1  . traMADol (ULTRAM) 50 MG tablet Take 1 tablet (50 mg total) by mouth every 8  (eight) hours as needed. 30 tablet 1   No current facility-administered medications for this visit.    REVIEW OF SYSTEMS:   Constitutional: Denies fevers, chills or abnormal night sweats, no weight loss. Eyes: Denies blurriness of vision, double vision or watery eyes Ears, nose, mouth, throat, and face: Denies mucositis or sore throat Respiratory: Denies cough, dyspnea or wheezes Cardiovascular: Denies palpitation, chest discomfort or lower extremity swelling Gastrointestinal:  Denies nausea, heartburn or change in bowel habits Skin: Denies abnormal skin rashes Lymphatics: Denies new lymphadenopathy or easy bruising Neurological:Denies numbness, tingling or new weaknesses Behavioral/Psych: Mood is stable, no new changes  All other systems were reviewed with the patient and are negative.  PHYSICAL EXAMINATION: ECOG PERFORMANCE STATUS: 0 - Asymptomatic BP 138/84 mmHg  Pulse 106  Temp(Src) 98.2 F (36.8 C) (Oral)  Resp 19  Ht 5' 6"  (1.676 m)  Wt 208 lb 8 oz (94.575 kg)  BMI 33.67 kg/m2  SpO2  99% GENERAL:alert, no distress and comfortable SKIN: skin color, texture, turgor are normal, no rashes or significant lesions EYES: normal, conjunctiva are pink and non-injected, sclera clear OROPHARYNX:no exudate, no erythema and lips, buccal mucosa, and tongue normal  NECK: supple, thyroid normal size, non-tender, without nodularity LYMPH:  no palpable lymphadenopathy in the cervical, axillary or inguinal LUNGS: clear to auscultation and percussion with normal breathing effort HEART: regular rate & rhythm and no murmurs and no lower extremity edema ABDOMEN:abdomen soft, non-tender and normal bowel sounds Musculoskeletal:no cyanosis of digits and no clubbing  PSYCH: alert & oriented x 3 with fluent speech NEURO: no focal motor/sensory deficits  LABORATORY DATA:  I have reviewed the data as listed CBC Latest Ref Rng 08/10/2015 07/27/2015 07/20/2015  WBC 4.0 - 10.3 10e3/uL 7.1 6.1 4.5    Hemoglobin 13.0 - 17.1 g/dL 14.1 13.5 13.7  Hematocrit 38.4 - 49.9 % 40.9 40.6 41.1  Platelets 140 - 400 10e3/uL 255 205 222     Recent Labs  03/15/15 0925 03/16/15 1710 04/19/15 1125  07/20/15 0810 07/27/15 1309 08/10/15 1255  NA 137 138 139  < > 139 136 140  K 4.0 4.0 3.8  < > 3.7 3.7 3.7  CL 105 103 107  --   --   --   --   CO2 24 26 24   < > 21* 25 22  GLUCOSE 147* 149* 93  < > 164* 150* 171*  BUN 7 14 12   < > 6.9* 6.9* 6.3*  CREATININE 0.81 1.04 0.73  < > 0.8 0.8 0.8  CALCIUM 9.0 9.4 9.4  < > 9.0 8.9 9.3  GFRNONAA >60 >60 >60  --   --   --   --   GFRAA >60 >60 >60  --   --   --   --   PROT 6.6 7.2  --   < > 7.5 7.6 7.9  ALBUMIN 3.5 3.7  --   < > 3.5 3.6 3.8  AST 34 35  --   < > 41* 36* 36*  ALT 39 45  --   < > 60* 49 48  ALKPHOS 82 81  --   < > 96 103 97  BILITOT 0.6 0.6  --   < > 0.45 0.49 0.64  < > = values in this interval not displayed.   PATHOLOGY REPORT  Diagnosis 03/30/2015 1. Colon, polyp(s), cecal colon polyp, excision - TUBULAR ADENOMA. NO HIGH GRADE DYSPLASIA OR MALIGNANCY IDENTIFIED. 2. Colon, biopsy, sigmoid mass - INVASIVE ADENOCARCINOMA, SEE COMMENT. Microscopic Comment 2. Dr. Saralyn Pilar reviewed this case and concurs. The results were discussed with Dr. Michail Sermon on 03/31/15. (BNS:ds 03/31/15)   RADIOGRAPHIC STUDIES: I have personally reviewed the radiological images as listed and agreed with the findings in the report.  PET 04/12/2015 IMPRESSION: Hypermetabolic colonic mass near rectosigmoid junction, consistent with known primary colon carcinoma.  Tiny sub-cm pericolonic lymph nodes in sigmoid mesocolon are too small to characterize by PET, but are suspicious for early lymph node metastases.  Bilateral pulmonary metastases  CT chest, abdomen and pelvis with contrast 07/18/2015 IMPRESSION: 1. Mildly reduced size of the pulmonary nodules. 2. Reduce conspicuity of the rectosigmoid mass, although some of this may be due to collapse of the  surrounding colon. Adjacent sigmoid diverticulosis noted. 3. No new adenopathy. 4. There is some faint stranding in the mesentery which is nonspecific. No adenopathy. 5. Stable mild cardiomegaly. 6. Diffuse hepatic steatosis. 7. Nonobstructive nephrolithiasis.   ASSESSMENT & PLAN:  55 year old male, without significant past medical history except kidney stone, presented with intermittent bloody stool for 2 years, and colonoscopy showed a large sigmoid colon mass, CT scan showed multiple (at least 4) nodules in bilateral lungs, measuring about 1 cm.  1. Sigmoid colon adenocarcinoma, TxN1M1, probably stage IV with lung mets  -I previously reviewed his colonoscopy, CT scan findings and the biopsy results in great details with patient and his wife. -I personally reviewed his CT scan image with him, he has at least 4 lung nodules in bilateral lungs, measuring about 1 cm, highly suspicious for metastatic disease.  -I reviewed his PET CT scan image with patient and his wife in person. His multiple lung nodules are PET positive with max SUV 5.8. These are most consistent with metastatic disease. Unfortunately CT-guided lung nodule biopsy was attempted but unsuccessful. -His sigmoid colon mass is not obstructed, bleeding has been minimal, he does not need urgent surgery. But we did discuss the possibility of bowl obstruction, bleeding, ulceration, down the road and that he may need urgent surgery in that situation. -He is on first line chemo with Capeox, main side effect is his gastric pain.  -- His tumor does not contain KRAS/NRAS or BRAF mutation, so he would benefit from EGFR antibody, Panitumumab was added on from cycle 4, tolerating well  -I reviewed his restaging CT scan images from 07/18/2015 personally with patient and his wife. His 4 predominant pulmonary metastasis lesions has moderately decreased, no new lesions. He has had a partial response, we'll continue chemotherapy. -The patient is quite  adamant about his colon surgery. I recommend continue chemotherapy for 2 more months, and repeat with a PET scan, then I'll present his case in the tumor board to see if colon surgery can be offered.  - due to his severe  Side effects from his 4th cycle chemotherapy (  Probably mainly from oxaliplatin), oxaliplatin was held for cycle 5, and will dose reduce to 129m/m2, and will also reduced Xeloda to 20044mbid. -Pt refuse to have oxaliplatin and panitumumab on the same day, also he is due today, he request to postpone it to 2 weeks later  -lab reviewed, unremarkable, will proceed with chemo today   2. Gastric pain -secondary to oxaliplatin  -It's resolved now. Continue monitoring. He'll continue taking omeprazole.  3. Skin rashes, G1-2 -Secondary to panitumumab -Continue topical steroids and clindamycin gel, avoid sun exposure.  Plan -start cycle 6 CAPEOX today (cycle 5 with Xeloda alone) with dose reduction, Xeloda change to 200077m12h Day 1-14  -postpone panitumumab to 2/16 and 3/2 -I'll see him in 3 weeks before cycle 7 Capox on 2/23. Will order PET scan for 3/8 on next visit    I spent 25 minutes counseling the patient face to face. The total time spent in the appointment was 30 minutes and more than 50% was on counseling.     FenTruitt MerleD 08/10/2015

## 2015-08-10 NOTE — Progress Notes (Signed)
[place] Work  Clinical Social Work was referred by pt's wife for assessment of psychosocial needs due to pt's daughter having a hard time coping with her dad's illness.  Clinical Social Worker [facility] with [facility] at [facility] during infusion to offer support and assess for needs. CSW introduced self, explained role of CSW and Pt and Family Support team. CSW discussed option of support through [employer]. CSW provided [employer] and process. Pt plans to have wife make an appt for daughter. CSW also discussed option of GI group for additional support. Pt appreciated info, denied other needs and will reach out as needed.    Clinical Social Work interventions: Resource education and referral  Loren Racer, [place] Worker [place]  [place] Phone: [phone] Fax: [phone]

## 2015-08-11 ENCOUNTER — Other Ambulatory Visit: Payer: Self-pay | Admitting: Hematology

## 2015-08-11 ENCOUNTER — Other Ambulatory Visit: Payer: Self-pay | Admitting: *Deleted

## 2015-08-11 DIAGNOSIS — C187 Malignant neoplasm of sigmoid colon: Secondary | ICD-10-CM

## 2015-08-11 LAB — CEA (PARALLEL TESTING): CEA: <0.5 ng/mL (ref 0.0–5.0)

## 2015-08-11 LAB — CEA: CEA1: 0.5 ng/mL (ref 0.0–4.7)

## 2015-08-11 MED ORDER — CAPECITABINE 500 MG PO TABS: 2000.0000 mg | ORAL_TABLET | Freq: Two times a day (BID) | ORAL | Status: DC

## 2015-08-14 ENCOUNTER — Telehealth: Payer: Self-pay | Admitting: Hematology

## 2015-08-14 NOTE — Telephone Encounter (Signed)
Left message for patient re schedule changes - 2/9 cxd - added 2/23 and 3/2 - 2/16 remains as scheduled. Schedule mailed.

## 2015-08-17 ENCOUNTER — Other Ambulatory Visit: Payer: BLUE CROSS/BLUE SHIELD

## 2015-08-17 ENCOUNTER — Ambulatory Visit: Payer: BLUE CROSS/BLUE SHIELD

## 2015-08-24 ENCOUNTER — Encounter: Payer: Self-pay | Admitting: *Deleted

## 2015-08-24 ENCOUNTER — Other Ambulatory Visit (HOSPITAL_BASED_OUTPATIENT_CLINIC_OR_DEPARTMENT_OTHER): Payer: BLUE CROSS/BLUE SHIELD

## 2015-08-24 ENCOUNTER — Ambulatory Visit (HOSPITAL_BASED_OUTPATIENT_CLINIC_OR_DEPARTMENT_OTHER): Payer: BLUE CROSS/BLUE SHIELD

## 2015-08-24 VITALS — HR 90 | Temp 98.9°F | Resp 18

## 2015-08-24 DIAGNOSIS — C7801 Secondary malignant neoplasm of right lung: Secondary | ICD-10-CM | POA: Diagnosis not present

## 2015-08-24 DIAGNOSIS — C187 Malignant neoplasm of sigmoid colon: Secondary | ICD-10-CM

## 2015-08-24 DIAGNOSIS — C7800 Secondary malignant neoplasm of unspecified lung: Principal | ICD-10-CM

## 2015-08-24 DIAGNOSIS — Z5112 Encounter for antineoplastic immunotherapy: Secondary | ICD-10-CM

## 2015-08-24 DIAGNOSIS — C189 Malignant neoplasm of colon, unspecified: Secondary | ICD-10-CM

## 2015-08-24 DIAGNOSIS — C7802 Secondary malignant neoplasm of left lung: Secondary | ICD-10-CM | POA: Diagnosis not present

## 2015-08-24 LAB — CBC WITH DIFFERENTIAL/PLATELET
BASO%: 0.5 % (ref 0.0–2.0)
BASOS ABS: 0 10*3/uL (ref 0.0–0.1)
EOS ABS: 0.1 10*3/uL (ref 0.0–0.5)
EOS%: 0.8 % (ref 0.0–7.0)
HEMATOCRIT: 41.4 % (ref 38.4–49.9)
HEMOGLOBIN: 13.8 g/dL (ref 13.0–17.1)
LYMPH#: 1.8 10*3/uL (ref 0.9–3.3)
LYMPH%: 26.2 % (ref 14.0–49.0)
MCH: 30.2 pg (ref 27.2–33.4)
MCHC: 33.4 g/dL (ref 32.0–36.0)
MCV: 90.3 fL (ref 79.3–98.0)
MONO#: 1.2 10*3/uL — AB (ref 0.1–0.9)
MONO%: 16.8 % — ABNORMAL HIGH (ref 0.0–14.0)
NEUT#: 3.8 10*3/uL (ref 1.5–6.5)
NEUT%: 55.7 % (ref 39.0–75.0)
PLATELETS: 215 10*3/uL (ref 140–400)
RBC: 4.59 10*6/uL (ref 4.20–5.82)
RDW: 15.1 % — AB (ref 11.0–14.6)
WBC: 6.9 10*3/uL (ref 4.0–10.3)

## 2015-08-24 LAB — COMPREHENSIVE METABOLIC PANEL
ALBUMIN: 3.5 g/dL (ref 3.5–5.0)
ALK PHOS: 112 U/L (ref 40–150)
ALT: 68 U/L — ABNORMAL HIGH (ref 0–55)
ANION GAP: 8 meq/L (ref 3–11)
AST: 50 U/L — AB (ref 5–34)
BUN: 5.4 mg/dL — AB (ref 7.0–26.0)
CALCIUM: 9.1 mg/dL (ref 8.4–10.4)
CHLORIDE: 107 meq/L (ref 98–109)
CO2: 26 mEq/L (ref 22–29)
CREATININE: 0.8 mg/dL (ref 0.7–1.3)
EGFR: >90 mL/min/{1.73_m2} (ref >90)
Glucose: 115 mg/dl (ref 70–140)
POTASSIUM: 3.6 meq/L (ref 3.5–5.1)
Sodium: 140 mEq/L (ref 136–145)
Total Bilirubin: 0.47 mg/dL (ref 0.20–1.20)
Total Protein: 7.6 g/dL (ref 6.4–8.3)

## 2015-08-24 LAB — MAGNESIUM: Magnesium: 2.1 mg/dl (ref 1.5–2.5)

## 2015-08-24 MED ORDER — SODIUM CHLORIDE 0.9 % IV SOLN
Freq: Once | INTRAVENOUS | Status: AC
  Administered 2015-08-24: 15:00:00 via INTRAVENOUS

## 2015-08-24 MED ORDER — PANITUMUMAB CHEMO INJECTION 100 MG/5ML
6.0000 mg/kg | Freq: Once | INTRAVENOUS | Status: AC
  Administered 2015-08-24: 600 mg via INTRAVENOUS
  Filled 2015-08-24: qty 25

## 2015-08-24 NOTE — Progress Notes (Signed)
Okay to treat despite ALT=68 and AST = 50, per Dr. Burr Medico.

## 2015-08-24 NOTE — Progress Notes (Signed)
Oncology Nurse Navigator Documentation  Oncology Nurse Navigator Flowsheets 08/24/2015  Navigator Location [facility]  Navigator Encounter Type Treatment  Treatment Initiated Date 04/27/2015  Patient Visit Type MedOnc  Treatment Phase Treatment : Panitumumab today  Barriers/Navigation Needs No Questions;No Needs  Education -  Interventions Other--supportive listening: father-in-law is ill in rehab, a daughter (54 yo) still having trouble dealing with all stress in family.  Referrals -  Education Method -  Support Groups/Services GI Support Group--encouraged him to attend group and see if other patients can advise/support him.  Acuity Level 1  Time Spent with Patient 15  Looking forward to finishing the chemo and having his surgery. Says he is a very impatient person.

## 2015-08-24 NOTE — Patient Instructions (Signed)
[facility] Discharge Instructions for Patients Receiving Chemotherapy  Today you received the following chemotherapy agents Vectibix  To help prevent nausea and vomiting after your treatment, we encourage you to take your nausea medication     If you develop nausea and vomiting that is not controlled by your nausea medication, call the clinic.   BELOW ARE SYMPTOMS THAT SHOULD BE REPORTED IMMEDIATELY:  *FEVER GREATER THAN 100.5 F  *CHILLS WITH OR WITHOUT FEVER  NAUSEA AND VOMITING THAT IS NOT CONTROLLED WITH YOUR NAUSEA MEDICATION  *UNUSUAL SHORTNESS OF BREATH  *UNUSUAL BRUISING OR BLEEDING  TENDERNESS IN MOUTH AND THROAT WITH OR WITHOUT PRESENCE OF ULCERS  *URINARY PROBLEMS  *BOWEL PROBLEMS  UNUSUAL RASH Items with * indicate a potential emergency and should be followed up as soon as possible.  Feel free to call the clinic you have any questions or concerns. The clinic phone number is [phone].  Please show the CHEMO ALERT CARD at check-in to the Emergency Department and triage nurse.   

## 2015-08-31 ENCOUNTER — Ambulatory Visit (HOSPITAL_BASED_OUTPATIENT_CLINIC_OR_DEPARTMENT_OTHER): Payer: BLUE CROSS/BLUE SHIELD

## 2015-08-31 ENCOUNTER — Other Ambulatory Visit (HOSPITAL_BASED_OUTPATIENT_CLINIC_OR_DEPARTMENT_OTHER): Payer: BLUE CROSS/BLUE SHIELD

## 2015-08-31 ENCOUNTER — Encounter: Payer: Self-pay | Admitting: Hematology

## 2015-08-31 ENCOUNTER — Telehealth: Payer: Self-pay | Admitting: *Deleted

## 2015-08-31 ENCOUNTER — Telehealth: Payer: Self-pay | Admitting: Hematology

## 2015-08-31 ENCOUNTER — Ambulatory Visit (HOSPITAL_BASED_OUTPATIENT_CLINIC_OR_DEPARTMENT_OTHER): Payer: BLUE CROSS/BLUE SHIELD | Admitting: Hematology

## 2015-08-31 VITALS — BP 129/64 | HR 96 | Temp 98.2°F | Resp 18 | Ht 66.0 in | Wt 211.4 lb

## 2015-08-31 DIAGNOSIS — C187 Malignant neoplasm of sigmoid colon: Secondary | ICD-10-CM

## 2015-08-31 DIAGNOSIS — C7800 Secondary malignant neoplasm of unspecified lung: Secondary | ICD-10-CM | POA: Diagnosis not present

## 2015-08-31 DIAGNOSIS — R109 Unspecified abdominal pain: Secondary | ICD-10-CM | POA: Diagnosis not present

## 2015-08-31 DIAGNOSIS — Z5111 Encounter for antineoplastic chemotherapy: Secondary | ICD-10-CM

## 2015-08-31 DIAGNOSIS — R1013 Epigastric pain: Secondary | ICD-10-CM

## 2015-08-31 DIAGNOSIS — C189 Malignant neoplasm of colon, unspecified: Secondary | ICD-10-CM

## 2015-08-31 DIAGNOSIS — L270 Generalized skin eruption due to drugs and medicaments taken internally: Secondary | ICD-10-CM

## 2015-08-31 LAB — CBC WITH DIFFERENTIAL/PLATELET
BASO%: 0.6 % (ref 0.0–2.0)
BASOS ABS: 0 10*3/uL (ref 0.0–0.1)
EOS ABS: 0.1 10*3/uL (ref 0.0–0.5)
EOS%: 1.2 % (ref 0.0–7.0)
HEMATOCRIT: 41.4 % (ref 38.4–49.9)
HEMOGLOBIN: 13.8 g/dL (ref 13.0–17.1)
LYMPH%: 29.8 % (ref 14.0–49.0)
MCH: 29.7 pg (ref 27.2–33.4)
MCHC: 33.3 g/dL (ref 32.0–36.0)
MCV: 89.4 fL (ref 79.3–98.0)
MONO#: 0.6 10*3/uL (ref 0.1–0.9)
MONO%: 10.9 % (ref 0.0–14.0)
NEUT#: 3.1 10*3/uL (ref 1.5–6.5)
NEUT%: 57.5 % (ref 39.0–75.0)
Platelets: 232 10*3/uL (ref 140–400)
RBC: 4.63 10*6/uL (ref 4.20–5.82)
RDW: 14.7 % — AB (ref 11.0–14.6)
WBC: 5.3 10*3/uL (ref 4.0–10.3)
lymph#: 1.6 10*3/uL (ref 0.9–3.3)

## 2015-08-31 LAB — COMPREHENSIVE METABOLIC PANEL
ALBUMIN: 3.6 g/dL (ref 3.5–5.0)
ALK PHOS: 96 U/L (ref 40–150)
ALT: 47 U/L (ref 0–55)
AST: 39 U/L — ABNORMAL HIGH (ref 5–34)
Anion Gap: 10 mEq/L (ref 3–11)
BUN: 6.7 mg/dL — ABNORMAL LOW (ref 7.0–26.0)
CALCIUM: 8.9 mg/dL (ref 8.4–10.4)
CHLORIDE: 105 meq/L (ref 98–109)
CO2: 24 mEq/L (ref 22–29)
Creatinine: 0.7 mg/dL (ref 0.7–1.3)
Glucose: 151 mg/dl — ABNORMAL HIGH (ref 70–140)
POTASSIUM: 3.5 meq/L (ref 3.5–5.1)
SODIUM: 138 meq/L (ref 136–145)
Total Bilirubin: 0.5 mg/dL (ref 0.20–1.20)
Total Protein: 7.4 g/dL (ref 6.4–8.3)

## 2015-08-31 MED ORDER — OXALIPLATIN CHEMO INJECTION 100 MG/20ML
100.0000 mg/m2 | Freq: Once | INTRAVENOUS | Status: AC
  Administered 2015-08-31: 220 mg via INTRAVENOUS
  Filled 2015-08-31: qty 30

## 2015-08-31 MED ORDER — SODIUM CHLORIDE 0.9 % IV SOLN: Freq: Once | INTRAVENOUS | Status: DC

## 2015-08-31 MED ORDER — DEXTROSE 5 % IV SOLN
Freq: Once | INTRAVENOUS | Status: AC
  Administered 2015-08-31: 15:00:00 via INTRAVENOUS

## 2015-08-31 MED ORDER — SODIUM CHLORIDE 0.9 % IV SOLN
Freq: Once | INTRAVENOUS | Status: AC
  Administered 2015-08-31: 15:00:00 via INTRAVENOUS
  Filled 2015-08-31: qty 4

## 2015-08-31 MED ORDER — SODIUM CHLORIDE 0.9 % IV SOLN: 6.0000 mg/kg | Freq: Once | INTRAVENOUS | Status: DC

## 2015-08-31 NOTE — Progress Notes (Signed)
[place]  [phone] Fax:([phone]) [phone]  Clinic Follow Up Note   Patient Care Team: Dibas Koirala, MD as PCP - General (Family Medicine) Wilford Corner, MD as Consulting Physician (Gastroenterology) Jackolyn Confer, MD as Consulting Physician (General Surgery) Truitt Merle, MD as Consulting Physician (Hematology) 08/31/2015  CHIEF COMPLAINTS:  Follow Up colon cancer  Oncology History   Colon cancer metastasized to lung [facility]   Staging form: Colon and Rectum, AJCC 7th Edition     Clinical stage from 03/30/2015: Stage Unknown ([place], N1, M1) - Unsigned       Colon cancer metastasized to lung ([place])   03/30/2015 Initial Biopsy Sigmoid mass biopsy showed invasive adenocarcinoma. Cecal colon polyps showed tubular adenoma.   03/30/2015 Initial Diagnosis Colon cancer   03/30/2015 Procedure colonoscopy by Dr. Michail Sermon showed a fungating, infiltrative and ulcerated nonobstructing large mass in the sigmoid colon and at 20 cm proximal to the anus. The mass was partially circumferential no bleeding. A 10 mm polyps in the cecum was removed.   04/03/2015 Imaging CT chest, abdomen and pelvis with contrast showed nodular masslike area of clinical worsening at rectosigmoid junction, tiny pericolonic lymph nodes, bilateral pulmonary nodules measuring about 1 cm.   04/12/2015 PET scan Hypermetabolic colonic mass near rectosigmoid junction, tiny subcentimeter paracolonic lymph nodes. Bilateral pulmonary metastasis.   04/19/2015 Procedure CT-guided lung nodule biopsy attempted, unsuccessful.   04/27/2015 -  Chemotherapy Oxaliplatin 130 mg/m on day 1, Capecitabine 2362m q12hr, 2 weeks on and one week off (only received 7 days for first cycle)    HISTORY OF PRESENTING ILLNESS:  Bill Cropper55y.o. male is here because of recently newly diagnosed colon cancer.  He has had intermittent bloody stool for 2 years, it has been mild, mixed with stool, patient does not have any  abdominal pain, constipation, change of his bowel habits, nausea, weight loss or other symptoms. He did not seek medical attention for this. He went to emergency room on 03/15/2015 for right flank pain, due to his kidney stone. CT scan incidentally found a 11 mm right lower lobe nodule and mucosal edema in the sigmoid colon. He saw his primary care physician, and was referred to GI Dr. SMichail Sermonhere at he underwent colonoscopy on 03/30/2015, which showed a fungating infiltrative and ulcerated nonobstructing large mass in the sigmoid colon, biopsy showed adenocarcinoma. CT chest abdomen and pelvis showed multiple lung nodules measuring about 1 cm. He was referred to surgeon Dr. RZella Richer who referred patient to [place] of his lung nodule and discuss chemotherapy.  He feels very well overall, denies any symptoms. He is a [employer] lives with his wife and 3 children. He never had screening colonoscopy prior the reason one, no significant past medical history, does not see doctors regularly.  CURRENT TREATMENT: Oxaliplatin 130 mg/m on day 1, Capecitabine 23016mq12hr, D1-14, every 21 days, added Panitumumab every 2 weeks on 06/29/2015, oxaliplatin held on cycle 5 and dose reduced to 10074m2, capecitabine reduced to 2000m28m2h D1-14, from cycle 6 on 08/10/2015  INTERIM HISTORY Ed[place]urns for follow-up and chemotherapy. He is doing well overall.  He tolerated previous cycle of oxaliplatin and Xeloda with dose reduction very well, no abdominal discomfort or other side effects. He did develop more skin rash from Panitumumab,  Which he has been avoiding to get on the same day with oxaliplatin,  which he believes makes page difference in terms of side effects..  He has good appetite  and eating well, gaining some weight, works full-time  MEDICAL HISTORY:  Past Medical History  Diagnosis Date  . Kidney calculi   . Hypercholesteremia     SURGICAL HISTORY: Past Surgical  History  Procedure Laterality Date  . Colonoscopy  03/30/15    SOCIAL HISTORY: Social History   Social History  . Marital Status: Married    Spouse Name: N/A  . Number of Children: 3, age of 62, 81 and 109   . Years of Education: N/A   Occupational History  . Banker for [employer] [place]    Social History Main Topics  . Smoking status: Never Smoker   . Smokeless tobacco: Not on file  . Alcohol Use: No  . Drug Use: No  . Sexual Activity: Not on file   Other Topics Concern  . Not on file   Social History Narrative    FAMILY HISTORY: Family History  Problem Relation Age of Onset  . Cancer Mother 20    breast cancer   . Diabetes Maternal Grandmother     ALLERGIES:  has No Known Allergies.  MEDICATIONS:  Current Outpatient Prescriptions  Medication Sig Dispense Refill  . acetaminophen (TYLENOL) 500 MG tablet Take 500 mg by mouth every 6 (six) hours as needed.    . capecitabine (XELODA) 500 MG tablet Take 4 tablets (2,000 mg total) by mouth 2 (two) times daily after a meal. Take on days 1-14 of chemotherapy. 112 tablet 3  . clindamycin (CLINDAGEL) 1 % gel Apply topically 2 (two) times daily. 30 g 5  . hydrocortisone 2.5 % lotion Apply topically 2 (two) times daily. 59 mL 5  . loperamide (IMODIUM A-D) 2 MG tablet Take 4 mg by mouth daily as needed for diarrhea or loose stools.    Marland Kitchen omeprazole (PRILOSEC) 20 MG capsule Take 1 capsule (20 mg total) by mouth 2 (two) times daily before a meal. 60 capsule 3  . ondansetron (ZOFRAN) 8 MG tablet Take 1 tablet (8 mg total) by mouth 2 (two) times daily. Start the day after chemo for 2 days. Then take as needed for nausea or vomiting. 30 tablet 1  . ranitidine (ZANTAC) 150 MG tablet Take 150 mg by mouth daily.    . sucralfate (CARAFATE) 1 G tablet Take 1 tablet (1 g total) by mouth 4 (four) times daily -  with meals and at bedtime. 60 tablet 1  . traMADol (ULTRAM) 50 MG tablet Take 1 tablet (50 mg total) by mouth every 8 (eight) hours as  needed. 30 tablet 1   No current facility-administered medications for this visit.    REVIEW OF SYSTEMS:   Constitutional: Denies fevers, chills or abnormal night sweats, no weight loss. Eyes: Denies blurriness of vision, double vision or watery eyes Ears, nose, mouth, throat, and face: Denies mucositis or sore throat Respiratory: Denies cough, dyspnea or wheezes Cardiovascular: Denies palpitation, chest discomfort or lower extremity swelling Gastrointestinal:  Denies nausea, heartburn or change in bowel habits Skin: Denies abnormal skin rashes Lymphatics: Denies new lymphadenopathy or easy bruising Neurological:Denies numbness, tingling or new weaknesses Behavioral/Psych: Mood is stable, no new changes  All other systems were reviewed with the patient and are negative.  PHYSICAL EXAMINATION: ECOG PERFORMANCE STATUS: 0 - Asymptomatic BP 129/64 mmHg  Pulse 96  Temp(Src) 98.2 F (36.8 C) (Oral)  Resp 18  Ht _0  (1.676 m)  Wt 211 lb 6.4 oz (95.89 kg)  BMI 34.14 kg/m2  SpO2 99% GENERAL:alert, no distress and comfortable SKIN:  skin color, texture, turgor are normal, no rashes or significant lesions EYES: normal, conjunctiva are pink and non-injected, sclera clear OROPHARYNX:no exudate, no erythema and lips, buccal mucosa, and tongue normal  NECK: supple, thyroid normal size, non-tender, without nodularity LYMPH:  no palpable lymphadenopathy in the cervical, axillary or inguinal LUNGS: clear to auscultation and percussion with normal breathing effort HEART: regular rate & rhythm and no murmurs and no lower extremity edema ABDOMEN:abdomen soft, non-tender and normal bowel sounds Musculoskeletal:no cyanosis of digits and no clubbing  PSYCH: alert & oriented x 3 with fluent speech NEURO: no focal motor/sensory deficits  LABORATORY DATA:  I have reviewed the data as listed CBC Latest Ref Rng 08/31/2015 08/24/2015 08/10/2015  WBC 4.0 - 10.3 10e3/uL 5.3 6.9 7.1  Hemoglobin 13.0 - 17.1  g/dL 13.8 13.8 14.1  Hematocrit 38.4 - 49.9 % 41.4 41.4 40.9  Platelets 140 - 400 10e3/uL 232 215 255     Recent Labs  03/15/15 0925 03/16/15 1710 04/19/15 1125  08/10/15 1255 08/24/15 1337 08/31/15 1228  NA 137 138 139  < > 140 140 138  K 4.0 4.0 3.8  < > 3.7 3.6 3.5  CL 105 103 107  --   --   --   --   CO2 _0 < > _1 GLUCOSE 147* 149* 93  < > 171* 115 151*  BUN _2 < > 6.3* 5.4* 6.7*  CREATININE 0.81 1.04 0.73  < > 0.8 0.8 0.7  CALCIUM 9.0 9.4 9.4  < > 9.3 9.1 8.9  GFRNONAA >60 >60 >60  --   --   --   --   GFRAA >60 >60 >60  --   --   --   --   PROT 6.6 7.2  --   < > 7.9 7.6 7.4  ALBUMIN 3.5 3.7  --   < > 3.8 3.5 3.6  AST 34 35  --   < > 36* 50* 39*  ALT 39 45  --   < > 48 68* 47  ALKPHOS 82 81  --   < > 97 112 96  BILITOT 0.6 0.6  --   < > 0.64 0.47 0.50  < > = values in this interval not displayed.   PATHOLOGY REPORT  Diagnosis 03/30/2015 1. Colon, polyp(s), cecal colon polyp, excision - TUBULAR ADENOMA. NO HIGH GRADE DYSPLASIA OR MALIGNANCY IDENTIFIED. 2. Colon, biopsy, sigmoid mass - INVASIVE ADENOCARCINOMA, SEE COMMENT. Microscopic Comment 2. Dr. Saralyn Pilar reviewed this case and concurs. The results were discussed with Dr. Michail Sermon on 03/31/15. (BNS:ds 03/31/15)   RADIOGRAPHIC STUDIES: I have personally reviewed the radiological images as listed and agreed with the findings in the report.  PET 04/12/2015 IMPRESSION: Hypermetabolic colonic mass near rectosigmoid junction, consistent with known primary colon carcinoma.  Tiny sub-cm pericolonic lymph nodes in sigmoid mesocolon are too small to characterize by PET, but are suspicious for early lymph node metastases.  Bilateral pulmonary metastases  CT chest, abdomen and pelvis with contrast 07/18/2015 IMPRESSION: 1. Mildly reduced size of the pulmonary nodules. 2. Reduce conspicuity of the rectosigmoid mass, although some of this may be due to collapse of the surrounding colon.  Adjacent sigmoid diverticulosis noted. 3. No new adenopathy. 4. There is some faint stranding in the mesentery which is nonspecific. No adenopathy. 5. Stable mild cardiomegaly. 6. Diffuse hepatic steatosis. 7. Nonobstructive nephrolithiasis.   ASSESSMENT & PLAN:  55 year old male, without significant past medical history except  kidney stone, presented with intermittent bloody stool for 2 years, and colonoscopy showed a large sigmoid colon mass, CT scan showed multiple (at least 4) nodules in bilateral lungs, measuring about 1 cm.  1. Sigmoid colon adenocarcinoma, TxN1M1, probably stage IV with lung mets  -I previously reviewed his colonoscopy, CT scan findings and the biopsy results in great details with patient and his wife. -I personally reviewed his CT scan image with him, he has at least 4 lung nodules in bilateral lungs, measuring about 1 cm, highly suspicious for metastatic disease.  -I reviewed his PET CT scan image with patient and his wife in person. His multiple lung nodules are PET positive with max SUV 5.8. These are most consistent with metastatic disease. Unfortunately CT-guided lung nodule biopsy was attempted but unsuccessful. -His sigmoid colon mass is not obstructed, bleeding has been minimal, he does not need urgent surgery. But we did discuss the possibility of bowl obstruction, bleeding, ulceration, down the road and that he may need urgent surgery in that situation. -He is on first line chemo with Capeox, main side effect is his gastric pain.  -- His tumor does not contain KRAS/NRAS or BRAF mutation, so he would benefit from EGFR antibody, Panitumumab was added on from cycle 4, tolerating well  -I reviewed his restaging CT scan images from 07/18/2015 personally with patient and his wife. His 4 predominant pulmonary metastasis lesions has moderately decreased, no new lesions. He has had a partial response, we'll continue chemotherapy. -The patient is quite adamant about his  colon surgery. I recommend continue chemotherapy for 2 more months, and repeat with a PET scan, then I'll present his case in the tumor board to see if colon surgery can be offered.  - due to his severe  Side effects from his 4th cycle chemotherapy (  Probably mainly from oxaliplatin), oxaliplatin was held for cycle 5, and dose was reduce to 162m/m2, and Xeloda was reduced to 20056mbid. -we'll schedule his PET scan for 3/13, will present his case in tumor board after the scan to see if he is a candidate of colon surgery. If not, will switch him to maintenance Xeloda and panitumumab. Patient refused to continue oxaliplatin anymore.  2. Gastric pain -secondary to oxaliplatin  -It's resolved now. Continue monitoring. He'll continue taking omeprazole.  3. Skin rashes, G1-2 -Secondary to panitumumab -Continue topical steroids and clindamycin gel, avoid sun exposure.  Plan -start cycle 7 CAPEOX today, same dose as last cycle  - panitumumab next week and continue every 2 weeks -I'll see him in 3 weeks after PET on 3/13    I spent 25 minutes counseling the patient face to face. The total time spent in the appointment was 30 minutes and more than 50% was on counseling.     FeTruitt MerleMD 08/31/2015

## 2015-08-31 NOTE — Patient Instructions (Signed)
Oxaliplatin Injection What is this medicine? OXALIPLATIN (ox AL i PLA tin) is a chemotherapy drug. It targets fast dividing cells, like cancer cells, and causes these cells to die. This medicine is used to treat cancers of the colon and rectum, and many other cancers. This medicine may be used for other purposes; ask your health care provider or pharmacist if you have questions. What should I tell my health care provider before I take this medicine? They need to know if you have any of these conditions: -kidney disease -an unusual or allergic reaction to oxaliplatin, other chemotherapy, other medicines, foods, dyes, or preservatives -pregnant or trying to get pregnant -breast-feeding How should I use this medicine? This drug is given as an infusion into a vein. It is administered in a hospital or clinic by a specially trained health care professional. Talk to your pediatrician regarding the use of this medicine in children. Special care may be needed. Overdosage: If you think you have taken too much of this medicine contact a poison control center or emergency room at once. NOTE: This medicine is only for you. Do not share this medicine with others. What if I miss a dose? It is important not to miss a dose. Call your doctor or health care professional if you are unable to keep an appointment. What may interact with this medicine? -medicines to increase blood counts like filgrastim, pegfilgrastim, sargramostim -probenecid -some antibiotics like amikacin, gentamicin, neomycin, polymyxin B, streptomycin, tobramycin -zalcitabine Talk to your doctor or health care professional before taking any of these medicines: -acetaminophen -aspirin -ibuprofen -ketoprofen -naproxen This list may not describe all possible interactions. Give your health care provider a list of all the medicines, herbs, non-prescription drugs, or dietary supplements you use. Also tell them if you smoke, drink alcohol, or use  illegal drugs. Some items may interact with your medicine. What should I watch for while using this medicine? Your condition will be monitored carefully while you are receiving this medicine. You will need important blood work done while you are taking this medicine. This medicine can make you more sensitive to cold. Do not drink cold drinks or use ice. Cover exposed skin before coming in contact with cold temperatures or cold objects. When out in cold weather wear warm clothing and cover your mouth and nose to warm the air that goes into your lungs. Tell your doctor if you get sensitive to the cold. This drug may make you feel generally unwell. This is not uncommon, as chemotherapy can affect healthy cells as well as cancer cells. Report any side effects. Continue your course of treatment even though you feel ill unless your doctor tells you to stop. In some cases, you may be given additional medicines to help with side effects. Follow all directions for their use. Call your doctor or health care professional for advice if you get a fever, chills or sore throat, or other symptoms of a cold or flu. Do not treat yourself. This drug decreases your body's ability to fight infections. Try to avoid being around people who are sick. This medicine may increase your risk to bruise or bleed. Call your doctor or health care professional if you notice any unusual bleeding. Be careful brushing and flossing your teeth or using a toothpick because you may get an infection or bleed more easily. If you have any dental work done, tell your dentist you are receiving this medicine. Avoid taking products that contain aspirin, acetaminophen, ibuprofen, naproxen, or ketoprofen unless instructed  may get an infection or bleed more easily. If you have any dental work done, tell your dentist you are receiving this medicine.  Avoid taking products that contain aspirin, acetaminophen, ibuprofen, naproxen, or ketoprofen unless instructed by your doctor. These medicines may hide a fever.  Do not become pregnant while taking this medicine. Women should inform their doctor if they wish to become pregnant or think they might be pregnant. There is a potential for serious side  effects to an unborn child. Talk to your health care professional or pharmacist for more information. Do not breast-feed an infant while taking this medicine.  Call your doctor or health care professional if you get diarrhea. Do not treat yourself.  What side effects may I notice from receiving this medicine?  Side effects that you should report to your doctor or health care professional as soon as possible:  -allergic reactions like skin rash, itching or hives, swelling of the face, lips, or tongue  -low blood counts - This drug may decrease the number of white blood cells, red blood cells and platelets. You may be at increased risk for infections and bleeding.  -signs of infection - fever or chills, cough, sore throat, pain or difficulty passing urine  -signs of decreased platelets or bleeding - bruising, pinpoint red spots on the skin, black, tarry stools, nosebleeds  -signs of decreased red blood cells - unusually weak or tired, fainting spells, lightheadedness  -breathing problems  -chest pain, pressure  -cough  -diarrhea  -jaw tightness  -mouth sores  -nausea and vomiting  -pain, swelling, redness or irritation at the injection site  -pain, tingling, numbness in the hands or feet  -problems with balance, talking, walking  -redness, blistering, peeling or loosening of the skin, including inside the mouth  -trouble passing urine or change in the amount of urine  Side effects that usually do not require medical attention (report to your doctor or health care professional if they continue or are bothersome):  -changes in vision  -constipation  -hair loss  -loss of appetite  -metallic taste in the mouth or changes in taste  -stomach pain  This list may not describe all possible side effects. Call your doctor for medical advice about side effects. You may report side effects to FDA at 1-800-FDA-1088.  Where should I keep my medicine?  This drug is given in a hospital or clinic and will not be stored at home.  NOTE:  This sheet is a summary. It may not cover all possible information. If you have questions about this medicine, talk to your doctor, pharmacist, or health care provider.      2016, Elsevier/Gold Standard. (2008-01-19 17:22:47)

## 2015-08-31 NOTE — Telephone Encounter (Signed)
Per staff message and POF I have scheduled appts. Advised scheduler of appts. JMW  

## 2015-08-31 NOTE — Telephone Encounter (Signed)
Pt confirmed labs/ov per 02/23 POF, gave pt AVS and Calendar.Cherylann Banas, sent msg to add chemo

## 2015-09-03 ENCOUNTER — Other Ambulatory Visit: Payer: Self-pay | Admitting: Hematology

## 2015-09-07 ENCOUNTER — Other Ambulatory Visit: Payer: Self-pay

## 2015-09-07 ENCOUNTER — Ambulatory Visit (HOSPITAL_BASED_OUTPATIENT_CLINIC_OR_DEPARTMENT_OTHER): Payer: BLUE CROSS/BLUE SHIELD

## 2015-09-07 ENCOUNTER — Other Ambulatory Visit: Payer: Self-pay | Admitting: *Deleted

## 2015-09-07 ENCOUNTER — Telehealth: Payer: Self-pay

## 2015-09-07 ENCOUNTER — Other Ambulatory Visit (HOSPITAL_BASED_OUTPATIENT_CLINIC_OR_DEPARTMENT_OTHER): Payer: BLUE CROSS/BLUE SHIELD

## 2015-09-07 VITALS — BP 128/96 | HR 91 | Temp 97.1°F | Resp 20

## 2015-09-07 DIAGNOSIS — C7800 Secondary malignant neoplasm of unspecified lung: Secondary | ICD-10-CM

## 2015-09-07 DIAGNOSIS — C187 Malignant neoplasm of sigmoid colon: Secondary | ICD-10-CM | POA: Diagnosis not present

## 2015-09-07 DIAGNOSIS — Z5111 Encounter for antineoplastic chemotherapy: Secondary | ICD-10-CM | POA: Diagnosis not present

## 2015-09-07 DIAGNOSIS — C189 Malignant neoplasm of colon, unspecified: Secondary | ICD-10-CM

## 2015-09-07 LAB — COMPREHENSIVE METABOLIC PANEL
ALT: 47 U/L (ref 0–55)
ANION GAP: 10 meq/L (ref 3–11)
AST: 41 U/L — AB (ref 5–34)
Albumin: 3.4 g/dL — ABNORMAL LOW (ref 3.5–5.0)
Alkaline Phosphatase: 99 U/L (ref 40–150)
BUN: 9.2 mg/dL (ref 7.0–26.0)
CHLORIDE: 106 meq/L (ref 98–109)
CO2: 23 meq/L (ref 22–29)
CREATININE: 0.8 mg/dL (ref 0.7–1.3)
Calcium: 9.2 mg/dL (ref 8.4–10.4)
EGFR: >90 mL/min/{1.73_m2} (ref >90)
Glucose: 114 mg/dl (ref 70–140)
POTASSIUM: 3.6 meq/L (ref 3.5–5.1)
Sodium: 138 mEq/L (ref 136–145)
Total Bilirubin: 0.41 mg/dL (ref 0.20–1.20)
Total Protein: 7.7 g/dL (ref 6.4–8.3)

## 2015-09-07 LAB — CBC WITH DIFFERENTIAL/PLATELET
BASO%: 0.2 % (ref 0.0–2.0)
Basophils Absolute: 0 10*3/uL (ref 0.0–0.1)
EOS ABS: 0.1 10*3/uL (ref 0.0–0.5)
EOS%: 1.1 % (ref 0.0–7.0)
HCT: 40.6 % (ref 38.4–49.9)
HEMOGLOBIN: 14.2 g/dL (ref 13.0–17.1)
LYMPH%: 19.9 % (ref 14.0–49.0)
MCH: 30.9 pg (ref 27.2–33.4)
MCHC: 35 g/dL (ref 32.0–36.0)
MCV: 88.3 fL (ref 79.3–98.0)
MONO#: 1.2 10*3/uL — ABNORMAL HIGH (ref 0.1–0.9)
MONO%: 14.3 % — AB (ref 0.0–14.0)
NEUT%: 64.5 % (ref 39.0–75.0)
NEUTROS ABS: 5.3 10*3/uL (ref 1.5–6.5)
Platelets: 154 10*3/uL (ref 140–400)
RBC: 4.6 10*6/uL (ref 4.20–5.82)
RDW: 13.4 % (ref 11.0–14.6)
WBC: 8.2 10*3/uL (ref 4.0–10.3)
lymph#: 1.6 10*3/uL (ref 0.9–3.3)

## 2015-09-07 LAB — MAGNESIUM: Magnesium: 2 mg/dl (ref 1.5–2.5)

## 2015-09-07 MED ORDER — SODIUM CHLORIDE 0.9 % IV SOLN
6.0000 mg/kg | Freq: Once | INTRAVENOUS | Status: AC
  Administered 2015-09-07: 600 mg via INTRAVENOUS
  Filled 2015-09-07: qty 25

## 2015-09-07 MED ORDER — SODIUM CHLORIDE 0.9 % IV SOLN
Freq: Once | INTRAVENOUS | Status: AC
  Administered 2015-09-07: 14:00:00 via INTRAVENOUS

## 2015-09-07 NOTE — Patient Instructions (Signed)
[facility] Discharge Instructions for Patients Receiving Chemotherapy  Today you received the following chemotherapy agents Vectibix  To help prevent nausea and vomiting after your treatment, we encourage you to take your nausea medication     If you develop nausea and vomiting that is not controlled by your nausea medication, call the clinic.   BELOW ARE SYMPTOMS THAT SHOULD BE REPORTED IMMEDIATELY:  *FEVER GREATER THAN 100.5 F  *CHILLS WITH OR WITHOUT FEVER  NAUSEA AND VOMITING THAT IS NOT CONTROLLED WITH YOUR NAUSEA MEDICATION  *UNUSUAL SHORTNESS OF BREATH  *UNUSUAL BRUISING OR BLEEDING  TENDERNESS IN MOUTH AND THROAT WITH OR WITHOUT PRESENCE OF ULCERS  *URINARY PROBLEMS  *BOWEL PROBLEMS  UNUSUAL RASH Items with * indicate a potential emergency and should be followed up as soon as possible.  Feel free to call the clinic you have any questions or concerns. The clinic phone number is [phone].  Please show the CHEMO ALERT CARD at check-in to the Emergency Department and triage nurse.   

## 2015-09-07 NOTE — Telephone Encounter (Signed)
Patient called regarding a PET scan that was suppose to be ordered for 09/18/15 per Dr. Ernestina Penna last clinic note.  Writer did not find an order and the PET scan is not on patient's schedule.  Patient would like a call back today to discuss.

## 2015-09-08 LAB — CEA (PARALLEL TESTING)

## 2015-09-08 LAB — CEA: CEA: 0.6 ng/mL (ref 0.0–4.7)

## 2015-09-11 ENCOUNTER — Encounter: Payer: Self-pay | Admitting: Hematology

## 2015-09-11 NOTE — Progress Notes (Signed)
Left on desk=[employer]

## 2015-09-11 NOTE — Progress Notes (Signed)
faxed notes/labs [phone]

## 2015-09-18 ENCOUNTER — Other Ambulatory Visit: Payer: Self-pay | Admitting: Hematology

## 2015-09-18 ENCOUNTER — Encounter (HOSPITAL_COMMUNITY)
Admission: RE | Admit: 2015-09-18 | Discharge: 2015-09-18 | Disposition: A | Discharge status: Home / Self Care | Payer: BLUE CROSS/BLUE SHIELD | Source: Ambulatory Visit | Attending: Hematology | Admitting: Hematology

## 2015-09-18 DIAGNOSIS — C189 Malignant neoplasm of colon, unspecified: Secondary | ICD-10-CM | POA: Diagnosis not present

## 2015-09-18 DIAGNOSIS — C7800 Secondary malignant neoplasm of unspecified lung: Secondary | ICD-10-CM | POA: Insufficient documentation

## 2015-09-18 LAB — GLUCOSE, CAPILLARY: Glucose-Capillary: 100 mg/dL — ABNORMAL HIGH (ref 65–99)

## 2015-09-18 MED ORDER — FLUDEOXYGLUCOSE F - 18 (FDG) INJECTION
10.4000 | Freq: Once | INTRAVENOUS | Status: AC | PRN
  Administered 2015-09-18: 10.4 via INTRAVENOUS

## 2015-09-21 ENCOUNTER — Ambulatory Visit (HOSPITAL_BASED_OUTPATIENT_CLINIC_OR_DEPARTMENT_OTHER): Payer: BLUE CROSS/BLUE SHIELD | Admitting: Hematology

## 2015-09-21 ENCOUNTER — Encounter: Payer: Self-pay | Admitting: Hematology

## 2015-09-21 ENCOUNTER — Ambulatory Visit (HOSPITAL_BASED_OUTPATIENT_CLINIC_OR_DEPARTMENT_OTHER): Payer: BLUE CROSS/BLUE SHIELD

## 2015-09-21 ENCOUNTER — Other Ambulatory Visit (HOSPITAL_BASED_OUTPATIENT_CLINIC_OR_DEPARTMENT_OTHER): Payer: BLUE CROSS/BLUE SHIELD

## 2015-09-21 VITALS — BP 138/81 | HR 85 | Temp 98.0°F | Resp 18 | Ht 66.0 in | Wt 210.3 lb

## 2015-09-21 DIAGNOSIS — C187 Malignant neoplasm of sigmoid colon: Secondary | ICD-10-CM

## 2015-09-21 DIAGNOSIS — C7800 Secondary malignant neoplasm of unspecified lung: Secondary | ICD-10-CM

## 2015-09-21 DIAGNOSIS — Z5112 Encounter for antineoplastic immunotherapy: Secondary | ICD-10-CM | POA: Diagnosis not present

## 2015-09-21 DIAGNOSIS — R918 Other nonspecific abnormal finding of lung field: Secondary | ICD-10-CM | POA: Diagnosis not present

## 2015-09-21 DIAGNOSIS — C7801 Secondary malignant neoplasm of right lung: Secondary | ICD-10-CM

## 2015-09-21 DIAGNOSIS — R21 Rash and other nonspecific skin eruption: Secondary | ICD-10-CM

## 2015-09-21 DIAGNOSIS — C189 Malignant neoplasm of colon, unspecified: Secondary | ICD-10-CM

## 2015-09-21 DIAGNOSIS — L270 Generalized skin eruption due to drugs and medicaments taken internally: Secondary | ICD-10-CM | POA: Insufficient documentation

## 2015-09-21 DIAGNOSIS — C7802 Secondary malignant neoplasm of left lung: Secondary | ICD-10-CM | POA: Diagnosis not present

## 2015-09-21 LAB — CBC WITH DIFFERENTIAL/PLATELET
BASO%: 0.7 % (ref 0.0–2.0)
BASOS ABS: 0 10*3/uL (ref 0.0–0.1)
EOS ABS: 0.1 10*3/uL (ref 0.0–0.5)
EOS%: 1.2 % (ref 0.0–7.0)
HEMATOCRIT: 42.5 % (ref 38.4–49.9)
HEMOGLOBIN: 14 g/dL (ref 13.0–17.1)
LYMPH%: 32.3 % (ref 14.0–49.0)
MCH: 29.3 pg (ref 27.2–33.4)
MCHC: 33 g/dL (ref 32.0–36.0)
MCV: 89 fL (ref 79.3–98.0)
MONO#: 0.6 10*3/uL (ref 0.1–0.9)
MONO%: 10.7 % (ref 0.0–14.0)
NEUT#: 2.9 10*3/uL (ref 1.5–6.5)
NEUT%: 55.1 % (ref 39.0–75.0)
PLATELETS: 232 10*3/uL (ref 140–400)
RBC: 4.77 10*6/uL (ref 4.20–5.82)
RDW: 13.7 % (ref 11.0–14.6)
WBC: 5.3 10*3/uL (ref 4.0–10.3)
lymph#: 1.7 10*3/uL (ref 0.9–3.3)

## 2015-09-21 LAB — COMPREHENSIVE METABOLIC PANEL
ALBUMIN: 3.5 g/dL (ref 3.5–5.0)
ALK PHOS: 90 U/L (ref 40–150)
ALT: 35 U/L (ref 0–55)
ANION GAP: 8 meq/L (ref 3–11)
AST: 31 U/L (ref 5–34)
BILIRUBIN TOTAL: 0.41 mg/dL (ref 0.20–1.20)
BUN: 6.2 mg/dL — ABNORMAL LOW (ref 7.0–26.0)
CALCIUM: 8.9 mg/dL (ref 8.4–10.4)
CO2: 28 mEq/L (ref 22–29)
Chloride: 105 mEq/L (ref 98–109)
Creatinine: 0.8 mg/dL (ref 0.7–1.3)
Glucose: 113 mg/dl (ref 70–140)
Potassium: 3.7 mEq/L (ref 3.5–5.1)
Sodium: 140 mEq/L (ref 136–145)
TOTAL PROTEIN: 7.6 g/dL (ref 6.4–8.3)

## 2015-09-21 LAB — MAGNESIUM: MAGNESIUM: 1.9 mg/dL (ref 1.5–2.5)

## 2015-09-21 MED ORDER — SODIUM CHLORIDE 0.9 % IV SOLN
Freq: Once | INTRAVENOUS | Status: AC
  Administered 2015-09-21: 15:00:00 via INTRAVENOUS

## 2015-09-21 MED ORDER — SODIUM CHLORIDE 0.9 % IV SOLN
6.0000 mg/kg | Freq: Once | INTRAVENOUS | Status: AC
  Administered 2015-09-21: 600 mg via INTRAVENOUS
  Filled 2015-09-21: qty 10

## 2015-09-21 NOTE — Progress Notes (Signed)
[place]  [phone]) [phone] Fax:([phone]) [phone]  Clinic Follow Up Note   Patient Care Team: Dibas Koirala, MD as PCP - General (Family Medicine) Wilford Corner, MD as Consulting Physician (Gastroenterology) Jackolyn Confer, MD as Consulting Physician (General Surgery) Truitt Merle, MD as Consulting Physician (Hematology) 09/21/2015  CHIEF COMPLAINTS:  Follow Up colon cancer  Oncology History   Colon cancer metastasized to lung [facility])   Staging form: Colon and Rectum, AJCC 7th Edition     Clinical stage from 03/30/2015: Stage Unknown ([place], N1, M1) - Unsigned       Colon cancer metastasized to lung ([place])   03/30/2015 Miscellaneous Foundation one genomic testing showed TP53 mutation, MSI stable, low tumor mutation burden. Negative for K-ras, NRAS and BRAF   03/30/2015 Initial Biopsy Sigmoid mass biopsy showed invasive adenocarcinoma. Cecal colon polyps showed tubular adenoma.   03/30/2015 Initial Diagnosis Colon cancer   03/30/2015 Procedure colonoscopy by Dr. Michail Sermon showed a fungating, infiltrative and ulcerated nonobstructing large mass in the sigmoid colon and at 20 cm proximal to the anus. The mass was partially circumferential no bleeding. A 10 mm polyps in the cecum was removed.   04/03/2015 Imaging CT chest, abdomen and pelvis with contrast showed nodular masslike area of clinical worsening at rectosigmoid junction, tiny pericolonic lymph nodes, bilateral pulmonary nodules measuring about 1 cm.   04/12/2015 PET scan Hypermetabolic colonic mass near rectosigmoid junction, tiny subcentimeter paracolonic lymph nodes. Bilateral pulmonary metastasis.   04/19/2015 Procedure CT-guided lung nodule biopsy attempted, unsuccessful.   04/27/2015 - 09/14/2015 Chemotherapy Oxaliplatin 130 mg/m on day 1, Capecitabine 2337m q12hr, 2 weeks on and one week off (only received 7 days for first cycle), oxaliplatin held on cycle 5 and dose reduced to 1034mm2, capecitabine reduced to  200041m12h D1-14, stopped per pt's request.    06/29/2015 -  Chemotherapy  Panitumumab every 2 weeks, some cycles were postponed due to pt's request    09/18/2015 Imaging Pulmonary nodules are less hypermetabolic, stable size. Stable hypermetabolic portal hepatis and abdominal peritoneal ligament lymph nodes. No other new lesions.     HISTORY OF PRESENTING ILLNESS:  Bill Full 55o. Wright is here because of recently newly diagnosed colon cancer.  He has had intermittent bloody stool for 2 years, it has been mild, mixed with stool, patient does not have any abdominal pain, constipation, change of his bowel habits, nausea, weight loss or other symptoms. He did not seek medical attention for this. He went to emergency room on 03/15/2015 for right flank pain, due to his kidney stone. CT scan incidentally found a 11 mm right lower lobe nodule and mucosal edema in the sigmoid colon. He saw his primary care physician, and was referred to GI Dr. SchMichail Sermonre at he underwent colonoscopy on 03/30/2015, which showed a fungating infiltrative and ulcerated nonobstructing large mass in the sigmoid colon, biopsy showed adenocarcinoma. CT chest abdomen and pelvis showed multiple lung nodules measuring about 1 cm. He was referred to surgeon Dr. RosZella Richerho referred patient to us [place] further workup of his lung nodule and discuss chemotherapy.  He feels very well overall, denies any symptoms. 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 He never had screening colonoscopy prior the reason one, no significant past medical history, does not see doctors regularly.  CURRENT TREATMENT: Panitumumab every 2 weeks since 06/29/2015  INTERIM HISTORY Bill Wright for follow-up and discuss PET scan findings. He is doing well overall. He had  taste change from chemotherapy which has improved this week. He has moderate skin rash on his chest, controlled by topical steroids and clindamycin gel. He  has good appetite and energy level, continues working full-time, no other complains.   MEDICAL HISTORY:  Past Medical History  Diagnosis Date  . Kidney calculi   . Hypercholesteremia     SURGICAL HISTORY: Past Surgical History  Procedure Laterality Date  . Colonoscopy  03/30/15    SOCIAL HISTORY: Social History   Social History  . Marital Status: Married    Spouse Name: N/A  . Number of Children: 3, age of 72, 51 and 39   . Years of Education: N/A   Occupational History  . Banker for [employer] [place]    Social History Main Topics  . Smoking status: Never Smoker   . Smokeless tobacco: Not on file  . Alcohol Use: No  . Drug Use: No  . Sexual Activity: Not on file   Other Topics Concern  . Not on file   Social History Narrative    FAMILY HISTORY: Family History  Problem Relation Age of Onset  . Cancer Mother 57    breast cancer   . Diabetes Maternal Grandmother     ALLERGIES:  has No Known Allergies.  MEDICATIONS:  Current Outpatient Prescriptions  Medication Sig Dispense Refill  . acetaminophen (TYLENOL) 500 MG tablet Take 500 mg by mouth every 6 (six) hours as needed.    . capecitabine (XELODA) 500 MG tablet Take 4 tablets (2,000 mg total) by mouth 2 (two) times daily after a meal. Take on days 1-14 of chemotherapy. 112 tablet 3  . clindamycin (CLINDAGEL) 1 % gel Apply topically 2 (two) times daily. 30 g 5  . hydrocortisone 2.5 % lotion Apply topically 2 (two) times daily. 59 mL 5  . loperamide (IMODIUM A-D) 2 MG tablet Take 4 mg by mouth daily as needed for diarrhea or loose stools.    Marland Kitchen omeprazole (PRILOSEC) 20 MG capsule Take 1 capsule (20 mg total) by mouth 2 (two) times daily before a meal. 60 capsule 3  . ondansetron (ZOFRAN) 8 MG tablet Take 1 tablet (8 mg total) by mouth 2 (two) times daily. Start the day after chemo for 2 days. Then take as needed for nausea or vomiting. 30 tablet 1  . ranitidine (ZANTAC) 150 MG tablet Take 150 mg by mouth daily.      . sucralfate (CARAFATE) 1 G tablet Take 1 tablet (1 g total) by mouth 4 (four) times daily -  with meals and at bedtime. 60 tablet 1  . traMADol (ULTRAM) 50 MG tablet Take 1 tablet (50 mg total) by mouth every 8 (eight) hours as needed. 30 tablet 1   No current facility-administered medications for this visit.    REVIEW OF SYSTEMS:   Constitutional: Denies fevers, chills or abnormal night sweats, no weight loss. Eyes: Denies blurriness of vision, double vision or watery eyes Ears, nose, mouth, throat, and face: Denies mucositis or sore throat Respiratory: Denies cough, dyspnea or wheezes Cardiovascular: Denies palpitation, chest discomfort or lower extremity swelling Gastrointestinal:  Denies nausea, heartburn or change in bowel habits Skin: Denies abnormal skin rashes Lymphatics: Denies new lymphadenopathy or easy bruising Neurological:Denies numbness, tingling or new weaknesses Behavioral/Psych: Mood is stable, no new changes  All other systems were reviewed with the patient and are negative.  PHYSICAL EXAMINATION: ECOG PERFORMANCE STATUS: 0 - Asymptomatic BP 138/81 mmHg  Pulse 85  Temp(Src) 98 F (36.7 C) (  Oral)  Resp 18  Ht _0  (1.676 m)  Wt 210 lb 4.8 oz (95.391 kg)  BMI 33.96 kg/m2 GENERAL:alert, no distress and comfortable SKIN: skin color, texture, turgor are normal, diffuse acne-like rash on his front chest and back, scattered rash on his neck and head.  EYES: normal, conjunctiva are pink and non-injected, sclera clear OROPHARYNX:no exudate, no erythema and lips, buccal mucosa, and tongue normal  NECK: supple, thyroid normal size, non-tender, without nodularity LYMPH:  no palpable lymphadenopathy in the cervical, axillary or inguinal LUNGS: clear to auscultation and percussion with normal breathing effort HEART: regular rate & rhythm and no murmurs and no lower extremity edema ABDOMEN:abdomen soft, non-tender and normal bowel sounds Musculoskeletal:no cyanosis of  digits and no clubbing  PSYCH: alert & oriented x 3 with fluent speech NEURO: no focal motor/sensory deficits  LABORATORY DATA:  I have reviewed the data as listed CBC Latest Ref Rng 09/21/2015 09/07/2015 08/31/2015  WBC 4.0 - 10.3 10e3/uL 5.3 8.2 5.3  Hemoglobin 13.0 - 17.1 g/dL 14.0 14.2 13.8  Hematocrit 38.4 - 49.9 % 42.5 40.6 41.4  Platelets 140 - 400 10e3/uL 232 154 232     Recent Labs  03/15/15 0925 03/16/15 1710 04/19/15 1125  08/31/15 1228 09/07/15 1319 09/21/15 1302  NA 137 138 139  < > 138 138 140  K 4.0 4.0 3.8  < > 3.5 3.6 3.7  CL 105 103 107  --   --   --   --   CO2 _1 < > _2 GLUCOSE 147* 149* 93  < > 151* 114 113  BUN _3 < > 6.7* 9.2 6.2*  CREATININE 0.81 1.04 0.73  < > 0.7 0.8 0.8  CALCIUM 9.0 9.4 9.4  < > 8.9 9.2 8.9  GFRNONAA >60 >60 >60  --   --   --   --   GFRAA >60 >60 >60  --   --   --   --   PROT 6.6 7.2  --   < > 7.4 7.7 7.6  ALBUMIN 3.5 3.7  --   < > 3.6 3.4* 3.5  AST 34 35  --   < > 39* 41* 31  ALT 39 45  --   < > 47 47 35  ALKPHOS 82 81  --   < > 96 99 90  BILITOT 0.6 0.6  --   < > 0.50 0.41 0.41  < > = values in this interval not displayed.   PATHOLOGY REPORT  Diagnosis 03/30/2015 1. Colon, polyp(s), cecal colon polyp, excision - TUBULAR ADENOMA. NO HIGH GRADE DYSPLASIA OR MALIGNANCY IDENTIFIED. 2. Colon, biopsy, sigmoid mass - INVASIVE ADENOCARCINOMA, SEE COMMENT. Microscopic Comment 2. Dr. Saralyn Pilar reviewed this case and concurs. The results were discussed with Dr. Michail Sermon on 03/31/15. (BNS:ds 03/31/15)   RADIOGRAPHIC STUDIES: I have personally reviewed the radiological images as listed and agreed with the findings in the report.  PET 04/12/2015 IMPRESSION: Hypermetabolic colonic mass near rectosigmoid junction, consistent with known primary colon carcinoma.  Tiny sub-cm pericolonic lymph nodes in sigmoid mesocolon are too small to characterize by PET, but are suspicious for early lymph node  metastases.  Bilateral pulmonary metastases  PET 09/18/2015 IMPRESSION: 1. Hypermetabolic pulmonary metastases have enlarged slightly from 07/18/2015. 2. Hypermetabolic porta hepatis/abdominal peritoneal ligament lymph nodes, stable from 04/12/2015. 3. Increase in hypermetabolism associated with mesenteric haziness and nodularity. While a reactive phenomenon/panniculitis can create this in appearance, metastatic disease/lymphoproliferative  disorder cannot be excluded. 4. Hepatic steatosis. 5. Bilateral nephrolithiasis.  ASSESSMENT & PLAN:  55 year old Wright, without significant past medical history except kidney stone, presented with intermittent bloody stool for 2 years, and colonoscopy showed a large sigmoid colon mass, CT scan showed multiple (at least 4) nodules in bilateral lungs, measuring about 1 cm.  1. Sigmoid colon adenocarcinoma, TxN1M1, probably stage IV with lung mets  -I previously reviewed his colonoscopy, CT scan findings and the biopsy results in great details with patient and his wife. -I personally reviewed his CT scan image with him, he has at least 4 lung nodules in bilateral lungs, measuring about 1 cm, highly suspicious for metastatic disease.  -I reviewed his PET CT scan image with patient and his wife in person. His multiple lung nodules are PET positive with max SUV 5.8. These are most consistent with metastatic disease. Unfortunately CT-guided lung nodule biopsy was attempted but unsuccessful. -His sigmoid colon mass is not obstructed, bleeding has been minimal, he does not need urgent surgery. But we did discuss the possibility of bowl obstruction, bleeding, ulceration, down the road and that he may need urgent surgery in that situation. -He is on first line chemo with Capeox, main side effect is his gastric pain.  -- His tumor does not contain KRAS/NRAS or BRAF mutation, so he would benefit from EGFR antibody, Panitumumab was added on from cycle 4, tolerating well   -I reviewed his restaging PET images from 09/18/2015 with pt in person, the size of his pulmonary nodules are stable, but a less hypermetabolic compared to his initial PET scan. No other new lesions. -We have discussed his in our GI tumor Board yesterday, surgery and S/P RT to his lung metastasis can be considered, but not high recommended due to the multiple metastatic disease.  -I spoke with interventional radiologist Dr. Ronny Bacon today, who has reviewed all his scans, and feels his lung lesions are difficult to biopsy.  -Patient refuses to have more cytotoxic chemotherapy for the new future. We'll stop CAPOX. -he is agreeable to continue panitumumab  -I will refer him back to surgeon Dr. Zella Richer who saw him before to consider surgical resection of his primary sigmoid colon mass. -If surgery is offered, and no more significant disease progression in 2-3 months, then I'll refer him to radiation oncologist Dr. Lisbeth Renshaw to discuss SBRT to his lung metastasis.  2. Skin rashes, G2 -Secondary to panitumumab -Continue topical steroids and clindamycin gel, avoid sun exposure.  Plan -Continue any tumor today and every 2 weeks  -Stop Xeloda and oxaliplatin  -Surgical referral  -I'll see him back in 2 weeks   I spent 35 minutes counseling the patient face to face. The total time spent in the appointment was 40 minutes and more than 50% was on counseling.     Truitt Merle, MD 09/21/2015

## 2015-09-21 NOTE — Patient Instructions (Signed)
[facility] Discharge Instructions for Patients Receiving Chemotherapy  Today you received the following chemotherapy agents Vectibix  To help prevent nausea and vomiting after your treatment, we encourage you to take your nausea medication as directed.   If you develop nausea and vomiting that is not controlled by your nausea medication, call the clinic.   BELOW ARE SYMPTOMS THAT SHOULD BE REPORTED IMMEDIATELY:  *FEVER GREATER THAN 100.5 F  *CHILLS WITH OR WITHOUT FEVER  NAUSEA AND VOMITING THAT IS NOT CONTROLLED WITH YOUR NAUSEA MEDICATION  *UNUSUAL SHORTNESS OF BREATH  *UNUSUAL BRUISING OR BLEEDING  TENDERNESS IN MOUTH AND THROAT WITH OR WITHOUT PRESENCE OF ULCERS  *URINARY PROBLEMS  *BOWEL PROBLEMS  UNUSUAL RASH Items with * indicate a potential emergency and should be followed up as soon as possible.  Feel free to call the clinic you have any questions or concerns. The clinic phone number is [phone].  Please show the CHEMO ALERT CARD at check-in to the Emergency Department and triage nurse.   

## 2015-09-22 ENCOUNTER — Other Ambulatory Visit: Payer: Self-pay | Admitting: Hematology

## 2015-09-22 DIAGNOSIS — C189 Malignant neoplasm of colon, unspecified: Secondary | ICD-10-CM

## 2015-09-22 DIAGNOSIS — C7800 Secondary malignant neoplasm of unspecified lung: Principal | ICD-10-CM

## 2015-09-29 NOTE — Progress Notes (Signed)
Thoracic Location of Tumor / Histology:Colon Cancer Metastasized to Lung, (right upper and lower lobe)  Patient presented months ago with symptoms of:  No sob,   Biopsies of  (if applicable) revealed: Pet scan 09/18/15,   Tobacco/Marijuana/Snuff/ETOH use: non smoker, no alcohol or illicit drug use  Past/Anticipated interventions by  surgery, if any: Referral back to Dr. Zella Richer   To consider surgical  resection of his primary sigmoid colon  Mass. Saw MD today 10/02/15 surgry next 2 weeks, not scheduled as yet  Past/Anticipated interventions by medical oncology, if any: Dr. Barnett Hatter and Oxliplatin stopped,  on Panitumumab every 2 weeks since 06/29/15, referral for SBRT to Lung, follow up 2 weeks 10/05/15 with infusion   Signs/Symptoms  Weight changes, if any : no stable  Respiratory complaints, if any: NONE  Hemoptysis, if any: NONE  Pain issues, if any:  NONE   SAFETY ISSUES: NO  Prior radiation?  NO  Pacemaker/ICD?  No  Is the patient on methotrexate?  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 Ronny Bacon, MD,  Mother Breast Cancer, living, radiation,   Allergies:NKA There were no vitals taken for this visit.  Wt Readings from Last 3 Encounters:  09/21/15 210 lb 4.8 oz (95.391 kg)  08/31/15 211 lb 6.4 oz (95.89 kg)  08/10/15 208 lb 8 oz (94.575 kg)  BP 129/69 mmHg  Pulse 100  Temp(Src) 98.4 F (36.9 C) (Oral)  Resp 20  Ht 5\' 6"  (1.676 m)  Wt 211 lb 11.2 oz (96.026 kg)  BMI 34.19 kg/m2  SpO2 100%

## 2015-10-02 ENCOUNTER — Ambulatory Visit: Payer: Self-pay | Admitting: General Surgery

## 2015-10-02 ENCOUNTER — Encounter: Payer: Self-pay | Admitting: Radiation Oncology

## 2015-10-02 ENCOUNTER — Ambulatory Visit
Admission: RE | Admit: 2015-10-02 | Discharge: 2015-10-02 | Disposition: A | Discharge status: Home / Self Care | Payer: BLUE CROSS/BLUE SHIELD | Source: Ambulatory Visit | Attending: Radiation Oncology | Admitting: Radiation Oncology

## 2015-10-02 VITALS — BP 129/69 | HR 100 | Temp 98.4°F | Resp 20 | Ht 66.0 in | Wt 211.7 lb

## 2015-10-02 DIAGNOSIS — C7800 Secondary malignant neoplasm of unspecified lung: Secondary | ICD-10-CM

## 2015-10-02 DIAGNOSIS — C189 Malignant neoplasm of colon, unspecified: Secondary | ICD-10-CM

## 2015-10-02 DIAGNOSIS — Z87442 Personal history of urinary calculi: Secondary | ICD-10-CM | POA: Insufficient documentation

## 2015-10-02 DIAGNOSIS — E7800 Pure hypercholesterolemia, unspecified: Secondary | ICD-10-CM | POA: Diagnosis not present

## 2015-10-02 DIAGNOSIS — C7801 Secondary malignant neoplasm of right lung: Secondary | ICD-10-CM | POA: Insufficient documentation

## 2015-10-02 DIAGNOSIS — Z510 Encounter for antineoplastic radiation therapy: Secondary | ICD-10-CM | POA: Diagnosis present

## 2015-10-02 NOTE — Progress Notes (Signed)
Please see the Nurse Progress Note in the MD Initial Consult Encounter for this patient. 

## 2015-10-02 NOTE — H&P (Signed)
Neldon Mc. Novello 10/02/2015 11:34 AM Location: [facility] Patient #: [id number] DOB: 02-27-1961 Married / Language: English / Race: White Male  History of Present Illness Odis Hollingshead MD; 10/02/2015 12:16 PM) The patient is a 55 year old male.   Note:He was seen by me in September 2016 with a diagnosis of sigmoid colon cancer. Staging CTs demonstrated metastasis to his lungs. He subsequently has undergone chemotherapy with good results. His last dose of Panitumumab was 2 weeks ago. Dr. Burr Medico has sent him here to discuss resection of the primary sigmoid colon cancer. He is feeling good and is interested in proceeding with the surgery. He is eating well. He is working. His bowels are moving.  Allergies (Sonya Bynum, CMA; 10/02/2015 11:37 AM) No Known Drug Allergies 04/05/2015  Medication History (Sonya Bynum, CMA; 10/02/2015 11:37 AM) Clindamycin Phosphate (1% Gel, External) Active. No Current Medications (Taken starting 10/02/2015) Omeprazole (20MG  Capsule DR, Oral) Active. Capecitabine (150MG  Tablet, Oral) Active. Medications Reconciled    Vitals (Sonya Bynum CMA; 10/02/2015 11:37 AM) 10/02/2015 11:34 AM Weight: 207 lb Height: 66in Body Surface Area: 2.03 m Body Mass Index: 33.41 kg/m  Pulse: 79 (Regular)  BP: 132/70 (Sitting, Left Arm, Standard)      Physical Exam Odis Hollingshead MD; 10/02/2015 12:17 PM)  The physical exam findings are as follows: Note:General: Overweight male in NAD. Pleasant and cooperative.  HEENT: [place]/AT, no facial masses  EYES: EOMI, no icterus  NECK: Supple, no obvious mass.  CV: RRR, no murmur, no JVD.  CHEST: Breath sounds equal and clear. Respirations nonlabored.  ABDOMEN: Soft, nontender, nondistended, no masses, no organomegaly, no scars, no hernias.  MUSCULOSKELETAL: FROM, good muscle tone, no edema, no venous stasis changes  LYMPHATIC: No palpable cervical, supraclavicular,  adenopathy.  SKIN: No jaundice, punctate truncal rashes.  NEUROLOGIC: Alert and oriented, answers questions appropriately, normal gait and station.  PSYCHIATRIC: Normal mood, affect , and behavior.    Assessment & Plan Odis Hollingshead MD; 10/02/2015 12:18 PM)  MALIGNANT NEOPLASM OF SIGMOID COLON (C18.7) Impression: Stage IV disease. Has completed his chemotherapy. Took his last Panitumumab 2 weeks ago. He is interested in proceeding with sigmoid colectomy to remove the primary tumor.  Plan: Laparoscopic assisted partial colectomy. I have explained the procedure and risks of colon resection. Risks include but are not limited to bleeding, infection, wound problems, anesthesia, anastomotic leak, need for colostomy, need for reoperative surgery, injury to intraabominal organs (such as intestine, spleen, kidney, bladder, ureter, etc.), ileus, irregular bowel habits. He seems to understand and wants to proceed.  Jackolyn Confer, MD

## 2015-10-02 NOTE — Progress Notes (Signed)
Radiation Oncology         [phone] ________________________________  Name: Bill Wright MRN: [id number]  Date: 10/02/2015  DOB: September 20, 1960  MO:QHUTMLY,YTKPT, MD  Jackolyn Confer, MD     REFERRING PHYSICIAN: Jackolyn Confer, MD   DIAGNOSIS: The encounter diagnosis was Colon cancer metastasized to lung [facility]).   Colon cancer metastasized to right upper and lower lung.   HISTORY OF PRESENT ILLNESS:Bill Wright is a 55 y.o. male who is seen for an initial consultation visit for metastatic colon cancer to the lung. He went to the emergency department 03/15/2015 because of a kidney stone where a CT scan revealed a mass in his colon. A biopsy of a sigmoid mass seen during colonoscopy in September 2016 revealed adenocarcinoma. During his staging workup in September and October 2016 a PET scan revealed hypermetabolic changes of a colonic mass near the rectosigmoid junction consistent with his diagnosis, tiny subcentimeter pericolonic lymph nodes, and bilateral pulmonary metastases measuring 10 mm on the right with an SUV of 5.8. A CT-guided biopsy was attempted but could not be completed. He went on to receive chemotherapy with Oxaliplatin and Capecitabine between 04/27/2015-09/14/2015. He's also received Panitumumab starting December 2016 but discontinued this due to a syncopal like episode. Repeat imagining on 09/18/2015 revealed an increase in size of his pulmonary nodules with a right upper lobe measuring 1.3 cm previously 9 mm. A right lower lobe lesion measuring 1.2 cm versus 9 mm, peritoneal small bowel mesenteric adenopathy and an area thickening in the region of the sigmoid colon, low affinity for PET.   PREVIOUS RADIATION THERAPY: No   PAST MEDICAL HISTORY:  has a past medical history of Kidney calculi and Hypercholesteremia.     PAST SURGICAL HISTORY: Past Surgical History  Procedure Laterality Date  . Colonoscopy  03/30/15     FAMILY HISTORY:  Family History  Problem Relation  Age of Onset  . Cancer Mother 75    breast cancer   . Diabetes Maternal Grandmother      SOCIAL HISTORY:  reports that he has never smoked. He does not have any smokeless tobacco history on file. He reports that he does not drink alcohol or use illicit drugs.  He is married and has three children. 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  ALLERGIES: Review of patient's allergies indicates no known allergies.   MEDICATIONS:  Current Outpatient Prescriptions  Medication Sig Dispense Refill  . acetaminophen (TYLENOL) 500 MG tablet Take 500 mg by mouth every 6 (six) hours as needed.    . capecitabine (XELODA) 500 MG tablet Take 4 tablets (2,000 mg total) by mouth 2 (two) times daily after a meal. Take on days 1-14 of chemotherapy. 112 tablet 3  . clindamycin (CLINDAGEL) 1 % gel Apply topically 2 (two) times daily. 30 g 5  . hydrocortisone 2.5 % lotion Apply topically 2 (two) times daily. 59 mL 5  . loperamide (IMODIUM A-D) 2 MG tablet Take 4 mg by mouth daily as needed for diarrhea or loose stools.    Marland Kitchen omeprazole (PRILOSEC) 20 MG capsule Take 1 capsule (20 mg total) by mouth 2 (two) times daily before a meal. 60 capsule 3  . ondansetron (ZOFRAN) 8 MG tablet Take 1 tablet (8 mg total) by mouth 2 (two) times daily. Start the day after chemo for 2 days. Then take as needed for nausea or vomiting. 30 tablet 1  . ranitidine (ZANTAC) 150 MG tablet Take 150 mg by mouth daily.    Marland Kitchen  sucralfate (CARAFATE) 1 G tablet Take 1 tablet (1 g total) by mouth 4 (four) times daily -  with meals and at bedtime. 60 tablet 1  . traMADol (ULTRAM) 50 MG tablet Take 1 tablet (50 mg total) by mouth every 8 (eight) hours as needed. 30 tablet 1   No current facility-administered medications for this encounter.     REVIEW OF SYSTEMS: On review of systems, he states he is doing well overall. He mentions that his bowel and bladder are normal. He is no longer experiencing rectal bleeding, diarrhea, or  constipation. Normal bladder function. He denies any flank pain at this time. He is not experiencing abdominal pain, nausea, vomiting, chest pain, fevers, chills, productive cough or sore throat. He has not had any unintended weight changes recently. A complete review of systems is obtained and is otherwise negative     PHYSICAL EXAM:  height is _0  (1.676 m) and weight is 211 lb 11.2 oz (96.026 kg). His oral temperature is 98.4 F (36.9 C). His blood pressure is 129/69 and his pulse is 100. His respiration is 20 and oxygen saturation is 100%.   Pain scale 0/10  ECOG = 0  0 - Asymptomatic (Fully active, able to carry on all predisease activities without restriction)  1 - Symptomatic but completely ambulatory (Restricted in physically strenuous activity but ambulatory and able to carry out work of a light or sedentary nature. For example, light housework, office work)  2 - Symptomatic, <50% in bed during the day (Ambulatory and capable of all self care but unable to carry out any work activities. Up and about more than 50% of waking hours)  3 - Symptomatic, >50% in bed, but not bedbound (Capable of only limited self-care, confined to bed or chair 50% or more of waking hours)  4 - Bedbound (Completely disabled. Cannot carry on any self-care. Totally confined to bed or chair)  5 - Death   Eustace Pen MM, Creech RH, Tormey DC, et al. [phone]). "Toxicity and response criteria of the [facility]". [place] Oncol. 5 (6): 649-55  In general this is a well appearing caucasian male in no acute distress. He is alert and oriented x4 and appropriate throughout the examination. HEENT reveals that the patient is normocephalic, atraumatic. EOMs are intact. PERRLA. Skin is intact without any evidence of gross lesions. Cardiovascular exam reveals a regular rate and rhythm, no clicks rubs or murmurs are auscultated. Chest is clear to auscultation bilaterally. Lymphatic assessment is  performed and does not reveal any adenopathy in the cervical, supraclavicular, axillary, or inguinal chains. Abdomen has active bowel sounds in all quadrants and is intact. The abdomen is soft, non tender, non distended. Lower extremities are negative for pretibial pitting edema, deep calf tenderness, cyanosis or clubbing.    LABORATORY DATA:  Lab Results  Component Value Date   WBC 5.3 09/21/2015   HGB 14.0 09/21/2015   HCT 42.5 09/21/2015   MCV 89.0 09/21/2015   PLT 232 09/21/2015   Lab Results  Component Value Date   NA 140 09/21/2015   K 3.7 09/21/2015   CL 107 04/19/2015   CO2 28 09/21/2015   Lab Results  Component Value Date   ALT 35 09/21/2015   AST 31 09/21/2015   ALKPHOS 90 09/21/2015   BILITOT 0.41 09/21/2015      RADIOGRAPHY: Nm Pet Image Restag (ps) Skull Base To Thigh  09/18/2015  CLINICAL DATA:  Subsequent treatment strategy for metastatic colon cancer.  EXAM: NUCLEAR MEDICINE PET SKULL BASE TO THIGH TECHNIQUE: 10.4 mCi F-18 FDG was injected intravenously. Full-ring PET imaging was performed from the skull base to thigh after the radiotracer. CT data was obtained and used for attenuation correction and anatomic localization. FASTING BLOOD GLUCOSE:  Value: 100 mg/dl COMPARISON:  CT chest abdomen pelvis 07/18/2015 and PET 04/12/2015. FINDINGS: NECK No hypermetabolic lymph nodes in the neck. CT images show no acute findings. CHEST Pulmonary nodules have enlarged slightly in the interval from 07/18/2015. Index 1.3 cm nodule in the posterior segment right upper lobe (series 6, image 32) has an SUV max of 1.7, previously 9 mm. A cystic/cavitary nodule in the right lower lobe measures 12 mm (series 6, image 44) with an SUV max of 1.9, previously approximately 9 mm. No hypermetabolic mediastinal, hilar or axillary lymph nodes. Coronary artery calcification. Heart is mildly enlarged. No pericardial or pleural effusion. ABDOMEN/PELVIS There are hypermetabolic abdominal peritoneal  ligament and small bowel mesenteric lymph nodes. Index porta hepatis lymph node measures 12 mm (series 4, image 94) with an SUV max of 3.2, stable in size from 07/18/2015. Mesenteric haziness and nodularity has an SUV max of 3.9 (series 4, image 127). No abnormal hypermetabolism in the liver, adrenal glands, spleen or pancreas. CT images show heterogeneous low-attenuation in the liver. Gallbladder and adrenal glands are unremarkable. Tiny stones in the kidneys bilaterally. Low-attenuation lesion in the left kidney measures 8 mm, too small to characterize. Spleen, pancreas, stomach and small bowel are otherwise unremarkable. There may be thickening in the region of the sigmoid colon (series 4, image 156), unchanged from 07/18/2015, but this area is under distended. SKELETON No abnormal osseous hypermetabolism. Scattered degenerative changes in the spine. IMPRESSION: 1. Hypermetabolic pulmonary metastases have enlarged slightly from 07/18/2015. 2. Hypermetabolic porta hepatis/abdominal peritoneal ligament lymph nodes, stable from 04/12/2015. 3. Increase in hypermetabolism associated with mesenteric haziness and nodularity. While a reactive phenomenon/panniculitis can create this in appearance, metastatic disease/lymphoproliferative disorder cannot be excluded. 4. Hepatic steatosis. 5. Bilateral nephrolithiasis. Electronically Signed   By: Lorin Picket M.D.   On: 09/18/2015 09:42     IMPRESSION: Bill Wright is a 55 yo male with colon cancer metastasized to right upper and lower lung. He is a good candidate for SBRT.   PLAN: The findings on the patient's imaging studies, and the rationale for utilization of SBRT to treat the persistent pulmonary lesions is detailed. Dr. Lisbeth Renshaw reviews of the patient that he would recommend 5 treatments to the upper lobe lesion, and 3 fractions to the remaining  lesion. He reviews the risks, benefits, long and short term side effects of the treatment and the patient has had a  chance to ask questions regarding this. He has met with Dr. Zella Richer. The patient will contact us once he has proceeded with scheduling his surgery so that we could meet back about 2 weeks after his surgery for simulation, and then 1 month following surgery to begin his treatment. This procedure has been fully reviewed with the patient and written informed consent has been obtained.   We also discussed the role of genetic counseling as this patient is so young. He is interested in meeting with genetics, and we will schedule this referral.  The above documentation reflects my direct findings during this shared patient visit. Please see the separate note by Dr. Lisbeth Renshaw on this date for the remainder of the patient's plan of care.    Carola Rhine, PAC   This document serves as a  record of services personally performed by Shona Simpson, PAC and Kyung Rudd, MD. It was created on their behalf by Lendon Collar, a trained medical scribe. The creation of this record is based on the scribe's personal observations and the provider's statements to them. This document has been checked and approved by the attending provider.

## 2015-10-03 ENCOUNTER — Telehealth: Payer: Self-pay | Admitting: *Deleted

## 2015-10-03 ENCOUNTER — Encounter: Payer: Self-pay | Admitting: Hematology

## 2015-10-03 NOTE — Telephone Encounter (Signed)
Pt called and left message about appt on 3/30.   Spoke with pt and was informed that pt will have colon surgery in 2 weeks.  Pt wanted to know if he still needs to keep appt for Vectibix chemo on 3/30.   Dr. Burr Medico notified.   Spoke with pt again and informed pt that pt should keep scheduled appt  3/30 as is  As per Dr. Burr Medico. Informed pt that care management is working on his  FMLA .   Pt voiced understanding.

## 2015-10-03 NOTE — Progress Notes (Signed)
form left in box-placed for dr. Burr Medico to sign

## 2015-10-04 ENCOUNTER — Telehealth: Payer: Self-pay | Admitting: *Deleted

## 2015-10-04 NOTE — Addendum Note (Signed)
Encounter addended by: Kyung Rudd, MD on: 10/04/2015  9:00 PM<BR>     Documentation filed: Follow-up Section, LOS Section, Notes Section

## 2015-10-04 NOTE — Telephone Encounter (Signed)
CALLED PATIENT TO INFORM OF GENETICS APPT. FOR 11-09-15 @ 10 AM WITH KAYLA BOGGS, SPOKE WITH PATIENT AND HE IS AWARE OF THIS APPT.

## 2015-10-05 ENCOUNTER — Other Ambulatory Visit (HOSPITAL_BASED_OUTPATIENT_CLINIC_OR_DEPARTMENT_OTHER): Payer: BLUE CROSS/BLUE SHIELD

## 2015-10-05 ENCOUNTER — Encounter: Payer: Self-pay | Admitting: Hematology

## 2015-10-05 ENCOUNTER — Ambulatory Visit (HOSPITAL_BASED_OUTPATIENT_CLINIC_OR_DEPARTMENT_OTHER): Payer: BLUE CROSS/BLUE SHIELD | Admitting: Hematology

## 2015-10-05 ENCOUNTER — Ambulatory Visit (HOSPITAL_BASED_OUTPATIENT_CLINIC_OR_DEPARTMENT_OTHER): Payer: BLUE CROSS/BLUE SHIELD

## 2015-10-05 VITALS — BP 139/79 | HR 100 | Temp 98.6°F | Resp 20 | Ht 66.0 in | Wt 211.3 lb

## 2015-10-05 DIAGNOSIS — C189 Malignant neoplasm of colon, unspecified: Secondary | ICD-10-CM

## 2015-10-05 DIAGNOSIS — R918 Other nonspecific abnormal finding of lung field: Secondary | ICD-10-CM

## 2015-10-05 DIAGNOSIS — C7800 Secondary malignant neoplasm of unspecified lung: Principal | ICD-10-CM

## 2015-10-05 DIAGNOSIS — C187 Malignant neoplasm of sigmoid colon: Secondary | ICD-10-CM

## 2015-10-05 DIAGNOSIS — L270 Generalized skin eruption due to drugs and medicaments taken internally: Secondary | ICD-10-CM

## 2015-10-05 DIAGNOSIS — Z5112 Encounter for antineoplastic immunotherapy: Secondary | ICD-10-CM | POA: Diagnosis not present

## 2015-10-05 LAB — CBC WITH DIFFERENTIAL/PLATELET
BASO%: 0.5 % (ref 0.0–2.0)
Basophils Absolute: 0 10*3/uL (ref 0.0–0.1)
EOS%: 1.3 % (ref 0.0–7.0)
Eosinophils Absolute: 0.1 10*3/uL (ref 0.0–0.5)
HCT: 41.8 % (ref 38.4–49.9)
HEMOGLOBIN: 14.4 g/dL (ref 13.0–17.1)
LYMPH#: 1.5 10*3/uL (ref 0.9–3.3)
LYMPH%: 24.1 % (ref 14.0–49.0)
MCH: 30.1 pg (ref 27.2–33.4)
MCHC: 34.4 g/dL (ref 32.0–36.0)
MCV: 87.4 fL (ref 79.3–98.0)
MONO#: 0.7 10*3/uL (ref 0.1–0.9)
MONO%: 10.5 % (ref 0.0–14.0)
NEUT#: 4 10*3/uL (ref 1.5–6.5)
NEUT%: 63.6 % (ref 39.0–75.0)
Platelets: 203 10*3/uL (ref 140–400)
RBC: 4.78 10*6/uL (ref 4.20–5.82)
RDW: 13.2 % (ref 11.0–14.6)
WBC: 6.3 10*3/uL (ref 4.0–10.3)
nRBC: 0 % (ref 0–0)

## 2015-10-05 LAB — COMPREHENSIVE METABOLIC PANEL
ALT: 38 U/L (ref 0–55)
AST: 32 U/L (ref 5–34)
Albumin: 3.8 g/dL (ref 3.5–5.0)
Alkaline Phosphatase: 96 U/L (ref 40–150)
Anion Gap: 12 mEq/L — ABNORMAL HIGH (ref 3–11)
BILIRUBIN TOTAL: 0.47 mg/dL (ref 0.20–1.20)
BUN: 7.8 mg/dL (ref 7.0–26.0)
CHLORIDE: 103 meq/L (ref 98–109)
CO2: 25 meq/L (ref 22–29)
Calcium: 9.4 mg/dL (ref 8.4–10.4)
Creatinine: 0.8 mg/dL (ref 0.7–1.3)
GLUCOSE: 131 mg/dL (ref 70–140)
POTASSIUM: 3.6 meq/L (ref 3.5–5.1)
SODIUM: 140 meq/L (ref 136–145)
TOTAL PROTEIN: 8.1 g/dL (ref 6.4–8.3)

## 2015-10-05 LAB — MAGNESIUM: MAGNESIUM: 1.8 mg/dL (ref 1.5–2.5)

## 2015-10-05 MED ORDER — SODIUM CHLORIDE 0.9 % IV SOLN
6.0000 mg/kg | Freq: Once | INTRAVENOUS | Status: AC
  Administered 2015-10-05: 600 mg via INTRAVENOUS
  Filled 2015-10-05: qty 10

## 2015-10-05 MED ORDER — SODIUM CHLORIDE 0.9 % IV SOLN
Freq: Once | INTRAVENOUS | Status: AC
  Administered 2015-10-05: 15:00:00 via INTRAVENOUS

## 2015-10-05 NOTE — Progress Notes (Signed)
Bill Wright  Telephone:([phone]) [phone] Fax:([phone]) [phone]  Clinic Follow Up Note   Patient Care Team: Dibas Koirala, MD as PCP - General (Family Medicine) Wilford Corner, MD as Consulting Physician (Gastroenterology) Jackolyn Confer, MD as Consulting Physician (General Surgery) Truitt Merle, MD as Consulting Physician (Hematology) 10/05/2015  CHIEF COMPLAINTS:  Follow Up colon cancer  Oncology History   Colon cancer metastasized to lung [facility])   Staging form: Colon and Rectum, AJCC 7th Edition     Clinical stage from 03/30/2015: Stage Unknown ([place], N1, M1) - Unsigned       Colon cancer metastasized to lung ([place])   03/30/2015 Miscellaneous Foundation one genomic testing showed TP53 mutation, MSI stable, low tumor mutation burden. Negative for K-ras, NRAS and BRAF   03/30/2015 Initial Biopsy Sigmoid mass biopsy showed invasive adenocarcinoma. Cecal colon polyps showed tubular adenoma.   03/30/2015 Initial Diagnosis Colon cancer   03/30/2015 Procedure colonoscopy by Dr. Michail Sermon showed a fungating, infiltrative and ulcerated nonobstructing large mass in the sigmoid colon and at 20 cm proximal to the anus. The mass was partially circumferential no bleeding. A 10 mm polyps in the cecum was removed.   04/03/2015 Imaging CT chest, abdomen and pelvis with contrast showed nodular masslike area of clinical worsening at rectosigmoid junction, tiny pericolonic lymph nodes, bilateral pulmonary nodules measuring about 1 cm.   04/12/2015 PET scan Hypermetabolic colonic mass near rectosigmoid junction, tiny subcentimeter paracolonic lymph nodes. Bilateral pulmonary metastasis.   04/19/2015 Procedure CT-guided lung nodule biopsy attempted, unsuccessful.   04/27/2015 - 09/14/2015 Chemotherapy Oxaliplatin 130 mg/m on day 1, Capecitabine 2342m q12hr, 2 weeks on and one week off (only received 7 days for first cycle), oxaliplatin held on cycle 5 and dose reduced to 1040mm2, capecitabine reduced to  20004m12h D1-14, stopped per pt's request.    06/29/2015 -  Chemotherapy  Panitumumab every 2 weeks, some cycles were postponed due to pt's request    09/18/2015 Imaging Pulmonary nodules are less hypermetabolic, stable size. Stable hypermetabolic portal hepatis and abdominal peritoneal ligament lymph nodes. No other new lesions.     HISTORY OF PRESENTING ILLNESS:  Bill Wright 55o. male is here because of recently newly diagnosed colon cancer.  He has had intermittent bloody stool for 2 years, it has been mild, mixed with stool, patient does not have any abdominal pain, constipation, change of his bowel habits, nausea, weight loss or other symptoms. He did not seek medical attention for this. He went to emergency room on 03/15/2015 for right flank pain, due to his kidney stone. CT scan incidentally found a 11 mm right lower lobe nodule and mucosal edema in the sigmoid colon. He saw his primary care physician, and was referred to GI Dr. SchMichail Sermonre at he underwent colonoscopy on 03/30/2015, which showed a fungating infiltrative and ulcerated nonobstructing large mass in the sigmoid colon, biopsy showed adenocarcinoma. CT chest abdomen and pelvis showed multiple lung nodules measuring about 1 cm. He was referred to surgeon Dr. RosZella Richerho referred patient to us Korear further workup of his lung nodule and discuss chemotherapy.  He feels very well overall, denies any symptoms. 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 He never had screening colonoscopy prior the reason one, no significant past medical history, does not see doctors regularly.  CURRENT TREATMENT: Panitumumab every 2 weeks since 06/29/2015  INTERIM HISTORY EdwZakaryturns for follow-up and panitumumab treatment. He is doing very well overall. He is seeing  surgeon Dr. Zella Richer and probably will have surgery in a few weeks. He also met Dr. Lisbeth Renshaw, and as twice a day to his lung lesions were offered. He  is quite excited about that. His skin rashes stable, energy and eating much better since he came off chemotherapy. No other new complaints   MEDICAL HISTORY:  Past Medical History  Diagnosis Date  . Kidney calculi   . Hypercholesteremia     SURGICAL HISTORY: Past Surgical History  Procedure Laterality Date  . Colonoscopy  03/30/15    SOCIAL HISTORY: Social History   Social History  . Marital Status: Married    Spouse Name: N/A  . Number of Children: 3, age of 66, 60 and 14   . Years of Education: N/A   Occupational History  . Banker for [employer] [place]    Social History Main Topics  . Smoking status: Never Smoker   . Smokeless tobacco: Not on file  . Alcohol Use: No  . Drug Use: No  . Sexual Activity: Not on file   Other Topics Concern  . Not on file   Social History Narrative    FAMILY HISTORY: Family History  Problem Relation Age of Onset  . Cancer Mother 32    breast cancer   . Diabetes Maternal Grandmother     ALLERGIES:  has No Known Allergies.  MEDICATIONS:  Current Outpatient Prescriptions  Medication Sig Dispense Refill  . acetaminophen (TYLENOL) 500 MG tablet Take 500 mg by mouth every 6 (six) hours as needed.    . clindamycin (CLINDAGEL) 1 % gel Apply topically 2 (two) times daily. 30 g 5  . hydrocortisone 2.5 % lotion Apply topically 2 (two) times daily. 59 mL 5  . loperamide (IMODIUM A-D) 2 MG tablet Take 4 mg by mouth daily as needed for diarrhea or loose stools.    Marland Kitchen omeprazole (PRILOSEC) 20 MG capsule Take 1 capsule (20 mg total) by mouth 2 (two) times daily before a meal. 60 capsule 3  . ondansetron (ZOFRAN) 8 MG tablet Take 1 tablet (8 mg total) by mouth 2 (two) times daily. Start the day after chemo for 2 days. Then take as needed for nausea or vomiting. 30 tablet 1  . ranitidine (ZANTAC) 150 MG tablet Take 150 mg by mouth daily.    . sucralfate (CARAFATE) 1 G tablet Take 1 tablet (1 g total) by mouth 4 (four) times daily -  with meals and  at bedtime. 60 tablet 1  . traMADol (ULTRAM) 50 MG tablet Take 1 tablet (50 mg total) by mouth every 8 (eight) hours as needed. 30 tablet 1  . metroNIDAZOLE (FLAGYL) 500 MG tablet Reported on 10/05/2015. For Surg.  0  . neomycin (MYCIFRADIN) 500 MG tablet Reported on 10/05/2015. For Surg.  0   No current facility-administered medications for this visit.    REVIEW OF SYSTEMS:   Constitutional: Denies fevers, chills or abnormal night sweats, no weight loss. Eyes: Denies blurriness of vision, double vision or watery eyes Ears, nose, mouth, throat, and face: Denies mucositis or sore throat Respiratory: Denies cough, dyspnea or wheezes Cardiovascular: Denies palpitation, chest discomfort or lower extremity swelling Gastrointestinal:  Denies nausea, heartburn or change in bowel habits Skin: Denies abnormal skin rashes Lymphatics: Denies new lymphadenopathy or easy bruising Neurological:Denies numbness, tingling or new weaknesses Behavioral/Psych: Mood is stable, no new changes  All other systems were reviewed with the patient and are negative.  PHYSICAL EXAMINATION: ECOG PERFORMANCE STATUS: 0 - Asymptomatic BP  139/79 mmHg  Pulse 100  Temp(Src) 98.6 F (37 C) (Oral)  Resp 20  Ht _0  (1.676 m)  Wt 211 lb 4.8 oz (95.845 kg)  BMI 34.12 kg/m2  SpO2 99% GENERAL:alert, no distress and comfortable SKIN: skin color, texture, turgor are normal, diffuse acne-like rash on his front chest and back, scattered rash on his neck and head.  EYES: normal, conjunctiva are pink and non-injected, sclera clear OROPHARYNX:no exudate, no erythema and lips, buccal mucosa, and tongue normal  NECK: supple, thyroid normal size, non-tender, without nodularity LYMPH:  no palpable lymphadenopathy in the cervical, axillary or inguinal LUNGS: clear to auscultation and percussion with normal breathing effort HEART: regular rate & rhythm and no murmurs and no lower extremity edema ABDOMEN:abdomen soft, non-tender  and normal bowel sounds Musculoskeletal:no cyanosis of digits and no clubbing  PSYCH: alert & oriented x 3 with fluent speech NEURO: no focal motor/sensory deficits  LABORATORY DATA:  I have reviewed the data as listed CBC Latest Ref Rng 10/05/2015 09/21/2015 09/07/2015  WBC 4.0 - 10.3 10e3/uL 6.3 5.3 8.2  Hemoglobin 13.0 - 17.1 g/dL 14.4 14.0 14.2  Hematocrit 38.4 - 49.9 % 41.8 42.5 40.6  Platelets 140 - 400 10e3/uL 203 232 154     Recent Labs  03/15/15 0925 03/16/15 1710 04/19/15 1125  09/07/15 1319 09/21/15 1302 10/05/15 1310  NA 137 138 139  < > 138 140 140  K 4.0 4.0 3.8  < > 3.6 3.7 3.6  CL 105 103 107  --   --   --   --   CO2 _1 < > _2 GLUCOSE 147* 149* 93  < > 114 113 131  BUN _3 < > 9.2 6.2* 7.8  CREATININE 0.81 1.04 0.73  < > 0.8 0.8 0.8  CALCIUM 9.0 9.4 9.4  < > 9.2 8.9 9.4  GFRNONAA >60 >60 >60  --   --   --   --   GFRAA >60 >60 >60  --   --   --   --   PROT 6.6 7.2  --   < > 7.7 7.6 8.1  ALBUMIN 3.5 3.7  --   < > 3.4* 3.5 3.8  AST 34 35  --   < > 41* 31 32  ALT 39 45  --   < > 47 35 38  ALKPHOS 82 81  --   < > 99 90 96  BILITOT 0.6 0.6  --   < > 0.41 0.41 0.47  < > = values in this interval not displayed.   PATHOLOGY REPORT  Diagnosis 03/30/2015 1. Colon, polyp(s), cecal colon polyp, excision - TUBULAR ADENOMA. NO HIGH GRADE DYSPLASIA OR MALIGNANCY IDENTIFIED. 2. Colon, biopsy, sigmoid mass - INVASIVE ADENOCARCINOMA, SEE COMMENT. Microscopic Comment 2. Dr. Saralyn Pilar reviewed this case and concurs. The results were discussed with Dr. Michail Sermon on 03/31/15. (BNS:ds 03/31/15)   RADIOGRAPHIC STUDIES: I have personally reviewed the radiological images as listed and agreed with the findings in the report.  PET 04/12/2015 IMPRESSION: Hypermetabolic colonic mass near rectosigmoid junction, consistent with known primary colon carcinoma.  Tiny sub-cm pericolonic lymph nodes in sigmoid mesocolon are too small to characterize by PET, but are  suspicious for early lymph node metastases.  Bilateral pulmonary metastases  PET 09/18/2015 IMPRESSION: 1. Hypermetabolic pulmonary metastases have enlarged slightly from 07/18/2015. 2. Hypermetabolic porta hepatis/abdominal peritoneal ligament lymph nodes, stable from 04/12/2015. 3. Increase in hypermetabolism associated with mesenteric  haziness and nodularity. While a reactive phenomenon/panniculitis can create this in appearance, metastatic disease/lymphoproliferative disorder cannot be excluded. 4. Hepatic steatosis. 5. Bilateral nephrolithiasis.  ASSESSMENT & PLAN:  55 year old male, without significant past medical history except kidney stone, presented with intermittent bloody stool for 2 years, and colonoscopy showed a large sigmoid colon mass, CT scan showed multiple (at least 4) nodules in bilateral lungs, measuring about 1 cm.  1. Sigmoid colon adenocarcinoma, TxN1M1, probably stage IV with lung mets  -I previously reviewed his colonoscopy, CT scan findings and the biopsy results in great details with patient and his wife. -I personally reviewed his CT scan image with him, he has at least 4 lung nodules in bilateral lungs, measuring about 1 cm, highly suspicious for metastatic disease.  -We discussed that his disease is likely incurable, and the goal of therapy is maximum disease control and prolong his life. -I reviewed his PET CT scan image with patient and his wife in person. His multiple lung nodules are PET positive with max SUV 5.8. These are most consistent with metastatic disease. Unfortunately CT-guided lung nodule biopsy was attempted but unsuccessful. -His sigmoid colon mass is not obstructed, bleeding has been minimal, he does not need urgent surgery. But we did discuss the possibility of bowl obstruction, bleeding, ulceration, down the road and that he may need urgent surgery in that situation. -He had first line chemo with [facility], main side effect is his gastric  pain.  -- His tumor does not contain KRAS/NRAS or BRAF mutation, so he would benefit from EGFR antibody, Panitumumab was added on from cycle 4, tolerating well  -I reviewed his restaging PET images from 09/18/2015 with pt in person, the size of his pulmonary nodules are stable, but a less hypermetabolic compared to his initial PET scan. No other new lesions. -His case was discussed in our GI tumor Board, surgery and S/P RT to his lung metastasis can be considered, but not high recommended due to the multiple metastatic disease.  -He will have primary colon tumor removed in a few weeks, I plan to repeat a CT scan after he recovers from surgery, if stable, then consider lung SBRT.  -Chemotherapy has been stopped per patient's request. -he is agreeable to continue panitumumab through his surgery and radiation  2. Skin rashes, G2 -Secondary to panitumumab -Continue topical steroids and clindamycin gel, avoid sun exposure.  Plan -Continue panitumumab today and every 2 weeks, through his surgery and RT  -I'll see him back in 2 weeks   I spent 20 minutes counseling the patient face to face. The total time spent in the appointment was 25 minutes and more than 50% was on counseling.     Truitt Merle, MD 10/05/2015

## 2015-10-05 NOTE — Progress Notes (Signed)
Pt declined AVS.

## 2015-10-05 NOTE — Patient Instructions (Signed)
[facility] Discharge Instructions for Patients Receiving Chemotherapy  Today you received the following chemotherapy agents Vectibix  To help prevent nausea and vomiting after your treatment, we encourage you to take your nausea medication as directed.   If you develop nausea and vomiting that is not controlled by your nausea medication, call the clinic.   BELOW ARE SYMPTOMS THAT SHOULD BE REPORTED IMMEDIATELY:  *FEVER GREATER THAN 100.5 F  *CHILLS WITH OR WITHOUT FEVER  NAUSEA AND VOMITING THAT IS NOT CONTROLLED WITH YOUR NAUSEA MEDICATION  *UNUSUAL SHORTNESS OF BREATH  *UNUSUAL BRUISING OR BLEEDING  TENDERNESS IN MOUTH AND THROAT WITH OR WITHOUT PRESENCE OF ULCERS  *URINARY PROBLEMS  *BOWEL PROBLEMS  UNUSUAL RASH Items with * indicate a potential emergency and should be followed up as soon as possible.  Feel free to call the clinic you have any questions or concerns. The clinic phone number is [phone].  Please show the CHEMO ALERT CARD at check-in to the Emergency Department and triage nurse.   

## 2015-10-06 LAB — CEA: CEA1: 0.8 ng/mL (ref 0.0–4.7)

## 2015-10-09 ENCOUNTER — Encounter: Payer: Self-pay | Admitting: Hematology

## 2015-10-09 NOTE — Progress Notes (Signed)
Fax sent 05/01/15 I sent to medical records

## 2015-10-09 NOTE — Progress Notes (Signed)
I sent to medical records the faxes sent 05/23/15  11/916

## 2015-10-10 ENCOUNTER — Telehealth: Payer: Self-pay | Admitting: Radiation Oncology

## 2015-10-10 NOTE — Telephone Encounter (Signed)
Recall the pronounced when the patient would be having surgery, he is still waiting to hear back from Dr. Jasmine Awe office, but he states he will call me and let me know what the surgery date has been secured.

## 2015-10-11 ENCOUNTER — Other Ambulatory Visit: Payer: Self-pay | Admitting: Hematology

## 2015-10-11 ENCOUNTER — Telehealth: Payer: Self-pay | Admitting: *Deleted

## 2015-10-11 MED ORDER — MINOCYCLINE HCL 100 MG PO CAPS: 100.0000 mg | ORAL_CAPSULE | Freq: Two times a day (BID) | ORAL | Status: DC

## 2015-10-11 NOTE — Telephone Encounter (Signed)
Called pt & informed of ATB e-scribed & reminded to use clindamycin gel on rash & OK to use neosporin on big acne rash.  Reported to pt that Dr Burr Medico will call Dr Zella Richer to see if surgery can be moved to sooner.  Pt expressed understanding.

## 2015-10-11 NOTE — Telephone Encounter (Signed)
[name],  Please call him back, I will contact Dr. Zella Richer to see if he can move his surgery to a earlier date.  I called in minocycline for his acne rash, 1 tab bid, until I see him next time. Make sure he is using clindamycin gel on rash, he can also try Neosporin on the big acne rash.  Thanks  Bill Wright  10/11/2015

## 2015-10-11 NOTE — Telephone Encounter (Signed)
"  Dr. Zella Richer has scheduled me for surgery November 02, 2015.  That's far out so I need to know will I be okay until then?  If she wants to send him a message, he's out of the ofice until Monday." "I'm on vectibix.  I have a humongous, volcano size acne rash under my nose above lip.  It's been there for a few months.  Now it;s hard as a rock, scabbing with height to it and growing.  It's embarrassing.  What do I do about this?" Denies fever, pain, peeling or cracking.  Return number [phone].

## 2015-10-17 ENCOUNTER — Telehealth: Payer: Self-pay | Admitting: *Deleted

## 2015-10-17 NOTE — Telephone Encounter (Signed)
CALLED PATIENT TO INFORM OF APPTS. FOR MAY 10, SPOKE WITH PATIENT AND HE IS AWARE OF THESE APPTS.

## 2015-10-19 ENCOUNTER — Ambulatory Visit (HOSPITAL_BASED_OUTPATIENT_CLINIC_OR_DEPARTMENT_OTHER): Payer: BLUE CROSS/BLUE SHIELD | Admitting: Hematology

## 2015-10-19 ENCOUNTER — Encounter: Payer: Self-pay | Admitting: Hematology

## 2015-10-19 ENCOUNTER — Telehealth: Payer: Self-pay | Admitting: Hematology

## 2015-10-19 ENCOUNTER — Other Ambulatory Visit (HOSPITAL_BASED_OUTPATIENT_CLINIC_OR_DEPARTMENT_OTHER): Payer: BLUE CROSS/BLUE SHIELD

## 2015-10-19 ENCOUNTER — Telehealth: Payer: Self-pay | Admitting: *Deleted

## 2015-10-19 ENCOUNTER — Ambulatory Visit (HOSPITAL_BASED_OUTPATIENT_CLINIC_OR_DEPARTMENT_OTHER): Payer: BLUE CROSS/BLUE SHIELD

## 2015-10-19 VITALS — BP 144/58 | HR 103 | Temp 98.2°F | Resp 18 | Ht 66.0 in | Wt 216.3 lb

## 2015-10-19 DIAGNOSIS — C7801 Secondary malignant neoplasm of right lung: Secondary | ICD-10-CM

## 2015-10-19 DIAGNOSIS — C187 Malignant neoplasm of sigmoid colon: Secondary | ICD-10-CM

## 2015-10-19 DIAGNOSIS — Z5112 Encounter for antineoplastic immunotherapy: Secondary | ICD-10-CM

## 2015-10-19 DIAGNOSIS — L270 Generalized skin eruption due to drugs and medicaments taken internally: Secondary | ICD-10-CM | POA: Diagnosis not present

## 2015-10-19 DIAGNOSIS — C189 Malignant neoplasm of colon, unspecified: Secondary | ICD-10-CM

## 2015-10-19 DIAGNOSIS — C7802 Secondary malignant neoplasm of left lung: Secondary | ICD-10-CM

## 2015-10-19 DIAGNOSIS — C7800 Secondary malignant neoplasm of unspecified lung: Principal | ICD-10-CM

## 2015-10-19 LAB — CBC WITH DIFFERENTIAL/PLATELET
BASO%: 0.3 % (ref 0.0–2.0)
BASOS ABS: 0 10*3/uL (ref 0.0–0.1)
EOS%: 1.2 % (ref 0.0–7.0)
Eosinophils Absolute: 0.1 10*3/uL (ref 0.0–0.5)
HEMATOCRIT: 41.5 % (ref 38.4–49.9)
HGB: 14.2 g/dL (ref 13.0–17.1)
LYMPH#: 1.6 10*3/uL (ref 0.9–3.3)
LYMPH%: 23.9 % (ref 14.0–49.0)
MCH: 30.1 pg (ref 27.2–33.4)
MCHC: 34.2 g/dL (ref 32.0–36.0)
MCV: 88.1 fL (ref 79.3–98.0)
MONO#: 0.8 10*3/uL (ref 0.1–0.9)
MONO%: 11.5 % (ref 0.0–14.0)
NEUT#: 4.3 10*3/uL (ref 1.5–6.5)
NEUT%: 63.1 % (ref 39.0–75.0)
Platelets: 208 10*3/uL (ref 140–400)
RBC: 4.71 10*6/uL (ref 4.20–5.82)
RDW: 13.3 % (ref 11.0–14.6)
WBC: 6.8 10*3/uL (ref 4.0–10.3)

## 2015-10-19 LAB — COMPREHENSIVE METABOLIC PANEL
ALBUMIN: 3.6 g/dL (ref 3.5–5.0)
ALK PHOS: 102 U/L (ref 40–150)
ALT: 29 U/L (ref 0–55)
AST: 27 U/L (ref 5–34)
Anion Gap: 11 mEq/L (ref 3–11)
BUN: 8.4 mg/dL (ref 7.0–26.0)
CALCIUM: 8.9 mg/dL (ref 8.4–10.4)
CHLORIDE: 106 meq/L (ref 98–109)
CO2: 22 mEq/L (ref 22–29)
Creatinine: 0.8 mg/dL (ref 0.7–1.3)
GLUCOSE: 148 mg/dL — AB (ref 70–140)
POTASSIUM: 3.6 meq/L (ref 3.5–5.1)
SODIUM: 139 meq/L (ref 136–145)
Total Bilirubin: 0.47 mg/dL (ref 0.20–1.20)
Total Protein: 7.6 g/dL (ref 6.4–8.3)

## 2015-10-19 LAB — MAGNESIUM: Magnesium: 1.8 mg/dl (ref 1.5–2.5)

## 2015-10-19 MED ORDER — SODIUM CHLORIDE 0.9 % IV SOLN
6.0000 mg/kg | Freq: Once | INTRAVENOUS | Status: AC
  Administered 2015-10-19: 600 mg via INTRAVENOUS
  Filled 2015-10-19: qty 10

## 2015-10-19 MED ORDER — SODIUM CHLORIDE 0.9 % IV SOLN
Freq: Once | INTRAVENOUS | Status: AC
  Administered 2015-10-19: 14:00:00 via INTRAVENOUS

## 2015-10-19 NOTE — Patient Instructions (Signed)
[facility] Discharge Instructions for Patients Receiving Chemotherapy  Today you received the following chemotherapy agents Vectibix  To help prevent nausea and vomiting after your treatment, we encourage you to take your nausea medication as directed.   If you develop nausea and vomiting that is not controlled by your nausea medication, call the clinic.   BELOW ARE SYMPTOMS THAT SHOULD BE REPORTED IMMEDIATELY:  *FEVER GREATER THAN 100.5 F  *CHILLS WITH OR WITHOUT FEVER  NAUSEA AND VOMITING THAT IS NOT CONTROLLED WITH YOUR NAUSEA MEDICATION  *UNUSUAL SHORTNESS OF BREATH  *UNUSUAL BRUISING OR BLEEDING  TENDERNESS IN MOUTH AND THROAT WITH OR WITHOUT PRESENCE OF ULCERS  *URINARY PROBLEMS  *BOWEL PROBLEMS  UNUSUAL RASH Items with * indicate a potential emergency and should be followed up as soon as possible.  Feel free to call the clinic you have any questions or concerns. The clinic phone number is [phone].  Please show the CHEMO ALERT CARD at check-in to the Emergency Department and triage nurse.   

## 2015-10-19 NOTE — Progress Notes (Signed)
[place]  [phone] Fax:([phone]) [phone]  Clinic Follow Up Note   Patient Care Team: Dibas Koirala, MD as PCP - General (Family Medicine) Wilford Corner, MD as Consulting Physician (Gastroenterology) Jackolyn Confer, MD as Consulting Physician (General Surgery) Truitt Merle, MD as Consulting Physician (Hematology) 10/19/2015  CHIEF COMPLAINTS:  Follow Up colon cancer  Oncology History   Colon cancer metastasized to lung [facility])   Staging form: Colon and Rectum, AJCC 7th Edition     Clinical stage from 03/30/2015: Stage Unknown ([place], N1, M1) - Unsigned       Colon cancer metastasized to lung ([place])   03/30/2015 Miscellaneous Foundation one genomic testing showed TP53 mutation, MSI stable, low tumor mutation burden. Negative for K-ras, NRAS and BRAF   03/30/2015 Initial Biopsy Sigmoid mass biopsy showed invasive adenocarcinoma. Cecal colon polyps showed tubular adenoma.   03/30/2015 Initial Diagnosis Colon cancer   03/30/2015 Procedure colonoscopy by Dr. Michail Sermon showed a fungating, infiltrative and ulcerated nonobstructing large mass in the sigmoid colon and at 20 cm proximal to the anus. The mass was partially circumferential no bleeding. A 10 mm polyps in the cecum was removed.   04/03/2015 Imaging CT chest, abdomen and pelvis with contrast showed nodular masslike area of clinical worsening at rectosigmoid junction, tiny pericolonic lymph nodes, bilateral pulmonary nodules measuring about 1 cm.   04/12/2015 PET scan Hypermetabolic colonic mass near rectosigmoid junction, tiny subcentimeter paracolonic lymph nodes. Bilateral pulmonary metastasis.   04/19/2015 Procedure CT-guided lung nodule biopsy attempted, unsuccessful.   04/27/2015 - 09/14/2015 Chemotherapy Oxaliplatin 130 mg/m on day 1, Capecitabine 2320m q12hr, 2 weeks on and one week off (only received 7 days for first cycle), oxaliplatin held on cycle 5 and dose reduced to 106mm2, capecitabine reduced to  200076m12h D1-14, stopped per pt's request.    06/29/2015 -  Chemotherapy  Panitumumab every 2 weeks, some cycles were postponed due to pt's request    09/18/2015 Imaging Pulmonary nodules are less hypermetabolic, stable size. Stable hypermetabolic portal hepatis and abdominal peritoneal ligament lymph nodes. No other new lesions.     HISTORY OF PRESENTING ILLNESS:  Bill Wright 55o. male is here because of recently newly diagnosed colon cancer.  He has had intermittent bloody stool for 2 years, it has been mild, mixed with stool, patient does not have any abdominal pain, constipation, change of his bowel habits, nausea, weight loss or other symptoms. He did not seek medical attention for this. He went to emergency room on 03/15/2015 for right flank pain, due to his kidney stone. CT scan incidentally found a 11 mm right lower lobe nodule and mucosal edema in the sigmoid colon. He saw his primary care physician, and was referred to GI Dr. SchMichail Sermonre at he underwent colonoscopy on 03/30/2015, which showed a fungating infiltrative and ulcerated nonobstructing large mass in the sigmoid colon, biopsy showed adenocarcinoma. CT chest abdomen and pelvis showed multiple lung nodules measuring about 1 cm. He was referred to surgeon Dr. RosZella Richerho referred patient to us [place] further workup of his lung nodule and discuss chemotherapy.  He feels very well overall, denies any symptoms. 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 He never had screening colonoscopy prior the reason one, no significant past medical history, does not see doctors regularly.  CURRENT TREATMENT: Panitumumab every 2 weeks since 06/29/2015  INTERIM HISTORY EdwRaymondoturns for follow-up and panitumumab treatment. He is doing very well overall. He is scheduled  to have colon surgery by Dr. Zella Richer on April 27. He has intermittent skin rash from earlier, no itchiness, and a topical screams helps a lot,  he had a large boil beneath his nose a few weeks ago and much improved after taking minocycline which I called in on 10/11/2015. He otherwise doing well, has gained some weight, has very good appetite and energy level.  MEDICAL HISTORY:  Past Medical History  Diagnosis Date  . Kidney calculi   . Hypercholesteremia     SURGICAL HISTORY: Past Surgical History  Procedure Laterality Date  . Colonoscopy  03/30/15    SOCIAL HISTORY: Social History   Social History  . Marital Status: Married    Spouse Name: N/A  . Number of Children: 3, age of 24, 56 and 59   . Years of Education: N/A   Occupational History  . Banker for [employer] [place]    Social History Main Topics  . Smoking status: Never Smoker   . Smokeless tobacco: Not on file  . Alcohol Use: No  . Drug Use: No  . Sexual Activity: Not on file   Other Topics Concern  . Not on file   Social History Narrative    FAMILY HISTORY: Family History  Problem Relation Age of Onset  . Cancer Mother 44    breast cancer   . Diabetes Maternal Grandmother     ALLERGIES:  has No Known Allergies.  MEDICATIONS:  Current Outpatient Prescriptions  Medication Sig Dispense Refill  . clindamycin (CLINDAGEL) 1 % gel Apply topically 2 (two) times daily. 30 g 5  . hydrocortisone 2.5 % lotion Apply topically 2 (two) times daily. 59 mL 5  . minocycline (MINOCIN) 100 MG capsule Take 1 capsule (100 mg total) by mouth 2 (two) times daily. 60 capsule 1  . ondansetron (ZOFRAN) 8 MG tablet Take 1 tablet (8 mg total) by mouth 2 (two) times daily. Start the day after chemo for 2 days. Then take as needed for nausea or vomiting. (Patient not taking: Reported on 10/17/2015) 30 tablet 1   No current facility-administered medications for this visit.    REVIEW OF SYSTEMS:   Constitutional: Denies fevers, chills or abnormal night sweats, no weight loss. Eyes: Denies blurriness of vision, double vision or watery eyes Ears, nose, mouth, throat, and  face: Denies mucositis or sore throat Respiratory: Denies cough, dyspnea or wheezes Cardiovascular: Denies palpitation, chest discomfort or lower extremity swelling Gastrointestinal:  Denies nausea, heartburn or change in bowel habits Skin: Denies abnormal skin rashes Lymphatics: Denies new lymphadenopathy or easy bruising Neurological:Denies numbness, tingling or new weaknesses Behavioral/Psych: Mood is stable, no new changes  All other systems were reviewed with the patient and are negative.  PHYSICAL EXAMINATION: ECOG PERFORMANCE STATUS: 0 - Asymptomatic BP 144/58 mmHg  Pulse 103  Temp(Src) 98.2 F (36.8 C) (Oral)  Resp 18  Ht 5' 6"  (1.676 m)  Wt 216 lb 4.8 oz (98.113 kg)  BMI 34.93 kg/m2  SpO2 99% GENERAL:alert, no distress and comfortable SKIN: skin color, texture, turgor are normal, diffuse acne-like rash on his front chest and back, scattered rash on his neck and face, there is a healing large boil above his upper lip, no discharge  EYES: normal, conjunctiva are pink and non-injected, sclera clear OROPHARYNX:no exudate, no erythema and lips, buccal mucosa, and tongue normal  NECK: supple, thyroid normal size, non-tender, without nodularity LYMPH:  no palpable lymphadenopathy in the cervical, axillary or inguinal LUNGS: clear to auscultation and percussion with  normal breathing effort HEART: regular rate & rhythm and no murmurs and no lower extremity edema ABDOMEN:abdomen soft, non-tender and normal bowel sounds Musculoskeletal:no cyanosis of digits and no clubbing  PSYCH: alert & oriented x 3 with fluent speech NEURO: no focal motor/sensory deficits  LABORATORY DATA:  I have reviewed the data as listed CBC Latest Ref Rng 10/19/2015 10/05/2015 09/21/2015  WBC 4.0 - 10.3 10e3/uL 6.8 6.3 5.3  Hemoglobin 13.0 - 17.1 g/dL 14.2 14.4 14.0  Hematocrit 38.4 - 49.9 % 41.5 41.8 42.5  Platelets 140 - 400 10e3/uL 208 203 232     Recent Labs  03/15/15 0925 03/16/15 1710  04/19/15 1125  09/21/15 1302 10/05/15 1310 10/19/15 1241  NA 137 138 139  < > 140 140 139  K 4.0 4.0 3.8  < > 3.7 3.6 3.6  CL 105 103 107  --   --   --   --   CO2 24 26 24   < > 28 25 22   GLUCOSE 147* 149* 93  < > 113 131 148*  BUN 7 14 12   < > 6.2* 7.8 8.4  CREATININE 0.81 1.04 0.73  < > 0.8 0.8 0.8  CALCIUM 9.0 9.4 9.4  < > 8.9 9.4 8.9  GFRNONAA >60 >60 >60  --   --   --   --   GFRAA >60 >60 >60  --   --   --   --   PROT 6.6 7.2  --   < > 7.6 8.1 7.6  ALBUMIN 3.5 3.7  --   < > 3.5 3.8 3.6  AST 34 35  --   < > 31 32 27  ALT 39 45  --   < > 35 38 29  ALKPHOS 82 81  --   < > 90 96 102  BILITOT 0.6 0.6  --   < > 0.41 0.47 0.47  < > = values in this interval not displayed.   PATHOLOGY REPORT  Diagnosis 03/30/2015 1. Colon, polyp(s), cecal colon polyp, excision - TUBULAR ADENOMA. NO HIGH GRADE DYSPLASIA OR MALIGNANCY IDENTIFIED. 2. Colon, biopsy, sigmoid mass - INVASIVE ADENOCARCINOMA, SEE COMMENT. Microscopic Comment 2. Dr. Saralyn Pilar reviewed this case and concurs. The results were discussed with Dr. Michail Sermon on 03/31/15. (BNS:ds 03/31/15)    RADIOGRAPHIC STUDIES: I have personally reviewed the radiological images as listed and agreed with the findings in the report.  PET 04/12/2015 IMPRESSION: Hypermetabolic colonic mass near rectosigmoid junction, consistent with known primary colon carcinoma.  Tiny sub-cm pericolonic lymph nodes in sigmoid mesocolon are too small to characterize by PET, but are suspicious for early lymph node metastases.  Bilateral pulmonary metastases  PET 09/18/2015 IMPRESSION: 1. Hypermetabolic pulmonary metastases have enlarged slightly from 07/18/2015. 2. Hypermetabolic porta hepatis/abdominal peritoneal ligament lymph nodes, stable from 04/12/2015. 3. Increase in hypermetabolism associated with mesenteric haziness and nodularity. While a reactive phenomenon/panniculitis can create this in appearance, metastatic disease/lymphoproliferative  disorder cannot be excluded. 4. Hepatic steatosis. 5. Bilateral nephrolithiasis.  ASSESSMENT & PLAN:  55 year old male, without significant past medical history except kidney stone, presented with intermittent bloody stool for 2 years, and colonoscopy showed a large sigmoid colon mass, CT scan showed multiple (at least 4) nodules in bilateral lungs, measuring about 1 cm.  1. Sigmoid colon adenocarcinoma, TxN1M1, probably stage IV with lung mets  -I previously reviewed his colonoscopy, CT scan findings and the biopsy results in great details with patient and his wife. -I personally reviewed his CT scan image with  him, he has at least 4 lung nodules in bilateral lungs, measuring about 1 cm, highly suspicious for metastatic disease.  -We discussed that his disease is likely incurable, and the goal of therapy is maximum disease control and prolong his life. -I reviewed his PET CT scan image with patient and his wife in person. His multiple lung nodules are PET positive with max SUV 5.8. These are most consistent with metastatic disease. Unfortunately CT-guided lung nodule biopsy was attempted but unsuccessful. -His sigmoid colon mass is not obstructed, bleeding has been minimal, he does not need urgent surgery. But we did discuss the possibility of bowl obstruction, bleeding, ulceration, down the road and that he may need urgent surgery in that situation. -He had first line chemo with [facility], main side effect is his gastric pain.  -- His tumor does not contain KRAS/NRAS or BRAF mutation, so he would benefit from EGFR antibody, Panitumumab was added on from cycle 4, tolerating well  -I reviewed his restaging PET images from 09/18/2015 with pt in person, the size of his pulmonary nodules are stable, but a less hypermetabolic compared to his initial PET scan. No other new lesions. -His case was discussed in our GI tumor Board, surgery and S/P RT to his lung metastasis can be considered, but not high  recommended due to the multiple metastatic disease.  -He will have primary colon tumor removed on 11/02/2015, I plan to repeat a CT scan after he recovers from surgery, if stable, then consider lung SBRT.  -Chemotherapy has been stopped per patient's request. -he is agreeable to continue panitumumab through his surgery and radiation  2. Skin rashes, G2 -Secondary to panitumumab -Continue topical steroids and clindamycin gel, avoid sun exposure. -He is on minocycline now, we'll continue for one more week.  Plan -Continue panitumumab today and every 2 weeks, through his surgery and RT  -I'll see him back in on 5/10 with a restaging CT   I spent 20 minutes counseling the patient face to face. The total time spent in the appointment was 25 minutes and more than 50% was on counseling.     Truitt Merle, MD 10/19/2015

## 2015-10-19 NOTE — Telephone Encounter (Signed)
per pof to sch pt appt-per Dr Burr Medico on 4/25 no lab to sch just inf-sent MW email to sch trmt-pt to get updated copyof avs per pof

## 2015-10-19 NOTE — Telephone Encounter (Signed)
Per staff message and POF I have scheduled appts. Advised scheduler of appts. JMW  

## 2015-10-20 ENCOUNTER — Encounter: Payer: Self-pay | Admitting: Hematology

## 2015-10-20 NOTE — Progress Notes (Signed)
Mailed copy to patient and sent to med recrds

## 2015-10-20 NOTE — Progress Notes (Signed)
form left in box-placed for dr. Burr Medico to sign-faxed to   [phone]

## 2015-10-30 NOTE — Patient Instructions (Addendum)
Bill Wright  10/30/2015   Your procedure is scheduled on: 11-02-15   Report to [facility] Main  Entrance take [facility]  elevators to 3rd floor to  [place] at  1100 AM.  Call this number if you have problems the morning of surgery [phone]   Remember: ONLY 1 PERSON MAY GO WITH YOU TO SHORT STAY TO GET  READY MORNING OF [place].  Do not eat food or drink liquids :After Midnight.  Bowel Prep instructions per MD instructions.    Take these medicines the morning of surgery with A SIP OF WATER: none                                You may not have any metal on your body including hair pins and              piercings  Do not wear jewelry, make-up, lotions, powders or perfumes, deodorant             Do not wear nail polish.  Do not shave  48 hours prior to surgery.              Men may shave face and neck.   Do not bring valuables to the hospital. [place].  Contacts, dentures or bridgework may not be worn into surgery.  Leave suitcase in the car. After surgery it may be brought to your room.     Patients discharged the day of surgery will not be allowed to drive home.  Name and phone number of your driver:  Special Instructions: N/A               _____________________________________________________________________             [facility] - Preparing for Surgery Before surgery, you can play an important role.  Because skin is not sterile, your skin needs to be as free of germs as possible.  You can reduce the number of germs on your skin by washing with CHG (chlorahexidine gluconate) soap before surgery.  CHG is an antiseptic cleaner which kills germs and bonds with the skin to continue killing germs even after washing. Please DO NOT use if you have an allergy to CHG or antibacterial soaps.  If your skin becomes reddened/irritated stop using the CHG and inform your nurse when you arrive at Short  Stay. Do not shave (including legs and underarms) for at least 48 hours prior to the first CHG shower.  You may shave your face/neck. Please follow these instructions carefully:  1.  Shower with CHG Soap the night before surgery and the  morning of Surgery.  2.  If you choose to wash your hair, wash your hair first as usual with your  normal  shampoo.  3.  After you shampoo, rinse your hair and body thoroughly to remove the  shampoo.                           4.  Use CHG as you would any other liquid soap.  You can apply chg directly  to the skin and wash  Gently with a scrungie or clean washcloth.  5.  Apply the CHG Soap to your body ONLY FROM THE NECK DOWN.   Do not use on face/ open                           Wound or open sores. Avoid contact with eyes, ears mouth and genitals (private parts).                       Wash face,  Genitals (private parts) with your normal soap.             6.  Wash thoroughly, paying special attention to the area where your surgery  will be performed.  7.  Thoroughly rinse your body with warm water from the neck down.  8.  DO NOT shower/wash with your normal soap after using and rinsing off  the CHG Soap.                9.  Pat yourself dry with a clean towel.            10.  Wear clean pajamas.            11.  Place clean sheets on your bed the night of your first shower and do not  sleep with pets. Day of Surgery : Do not apply any lotions/deodorants the morning of surgery.  Please wear clean clothes to the hospital/surgery center.  FAILURE TO FOLLOW THESE INSTRUCTIONS MAY RESULT IN THE CANCELLATION OF YOUR SURGERY PATIENT SIGNATURE_________________________________  NURSE SIGNATURE__________________________________  ________________________________________________________________________

## 2015-10-31 ENCOUNTER — Telehealth: Payer: Self-pay | Admitting: *Deleted

## 2015-10-31 ENCOUNTER — Other Ambulatory Visit: Payer: Self-pay | Admitting: Hematology

## 2015-10-31 ENCOUNTER — Ambulatory Visit: Payer: BLUE CROSS/BLUE SHIELD

## 2015-10-31 ENCOUNTER — Other Ambulatory Visit: Payer: BLUE CROSS/BLUE SHIELD

## 2015-10-31 ENCOUNTER — Encounter (HOSPITAL_COMMUNITY): Payer: Self-pay

## 2015-10-31 ENCOUNTER — Encounter (HOSPITAL_COMMUNITY)
Admission: RE | Admit: 2015-10-31 | Discharge: 2015-10-31 | Disposition: A | Discharge status: Home / Self Care | Payer: BLUE CROSS/BLUE SHIELD | Source: Ambulatory Visit | Attending: General Surgery | Admitting: General Surgery

## 2015-10-31 HISTORY — DX: Anxiety disorder, unspecified: F41.9

## 2015-10-31 HISTORY — DX: Malignant (primary) neoplasm, unspecified: C80.1

## 2015-10-31 LAB — CBC WITH DIFFERENTIAL/PLATELET
BASOS PCT: 1 %
Basophils Absolute: 0.1 10*3/uL (ref 0.0–0.1)
EOS ABS: 0.2 10*3/uL (ref 0.0–0.7)
EOS PCT: 3 %
HCT: 41.6 % (ref 39.0–52.0)
Hemoglobin: 14.7 g/dL (ref 13.0–17.0)
LYMPHS ABS: 1.7 10*3/uL (ref 0.7–4.0)
Lymphocytes Relative: 26 %
MCH: 30.2 pg (ref 26.0–34.0)
MCHC: 35.3 g/dL (ref 30.0–36.0)
MCV: 85.4 fL (ref 78.0–100.0)
MONOS PCT: 15 %
Monocytes Absolute: 1 10*3/uL (ref 0.1–1.0)
Neutro Abs: 3.7 10*3/uL (ref 1.7–7.7)
Neutrophils Relative %: 55 %
PLATELETS: 223 10*3/uL (ref 150–400)
RBC: 4.87 MIL/uL (ref 4.22–5.81)
RDW: 13.1 % (ref 11.5–15.5)
WBC: 6.6 10*3/uL (ref 4.0–10.5)

## 2015-10-31 LAB — COMPREHENSIVE METABOLIC PANEL
ALT: 46 U/L (ref 17–63)
ANION GAP: 10 (ref 5–15)
AST: 38 U/L (ref 15–41)
Albumin: 4.1 g/dL (ref 3.5–5.0)
Alkaline Phosphatase: 96 U/L (ref 38–126)
BUN: 13 mg/dL (ref 6–20)
CO2: 27 mmol/L (ref 22–32)
CREATININE: 0.71 mg/dL (ref 0.61–1.24)
Calcium: 9.3 mg/dL (ref 8.9–10.3)
Chloride: 103 mmol/L (ref 101–111)
GFR calc non Af Amer: >60 mL/min (ref >60)
GLUCOSE: 106 mg/dL — AB (ref 65–99)
POTASSIUM: 4.2 mmol/L (ref 3.5–5.1)
SODIUM: 140 mmol/L (ref 135–145)
TOTAL PROTEIN: 7.8 g/dL (ref 6.5–8.1)
Total Bilirubin: 0.7 mg/dL (ref 0.3–1.2)

## 2015-10-31 LAB — PROTIME-INR
INR: 1.05 (ref 0.00–1.49)
Prothrombin Time: 13.9 seconds (ref 11.6–15.2)

## 2015-10-31 NOTE — Telephone Encounter (Signed)
Notified [name], RN with Dr. Burr Medico that pt did not show up for [facility] today. Noted surgery is scheduled for 4/27.

## 2015-10-31 NOTE — Pre-Procedure Instructions (Signed)
CXR 1 view 10'16 Epic, CT Chest/abd/pelvis 1'17 Epic.

## 2015-11-01 LAB — ABO/RH: ABO/RH(D): O POS

## 2015-11-02 ENCOUNTER — Inpatient Hospital Stay (HOSPITAL_COMMUNITY)
Admission: RE | Admit: 2015-11-02 | Discharge: 2015-11-07 | DRG: 330 | Disposition: A | Discharge status: Home / Self Care | Payer: BLUE CROSS/BLUE SHIELD | Source: Ambulatory Visit | Attending: General Surgery | Admitting: General Surgery

## 2015-11-02 ENCOUNTER — Inpatient Hospital Stay (HOSPITAL_COMMUNITY): Payer: BLUE CROSS/BLUE SHIELD | Admitting: Anesthesiology

## 2015-11-02 ENCOUNTER — Encounter (HOSPITAL_COMMUNITY)
Admission: RE | Disposition: A | Discharge status: Home / Self Care | Payer: Self-pay | Source: Ambulatory Visit | Attending: General Surgery

## 2015-11-02 ENCOUNTER — Encounter (HOSPITAL_COMMUNITY): Payer: Self-pay | Admitting: *Deleted

## 2015-11-02 ENCOUNTER — Ambulatory Visit: Payer: BLUE CROSS/BLUE SHIELD

## 2015-11-02 ENCOUNTER — Other Ambulatory Visit: Payer: BLUE CROSS/BLUE SHIELD

## 2015-11-02 ENCOUNTER — Ambulatory Visit: Payer: BLUE CROSS/BLUE SHIELD | Admitting: Hematology

## 2015-11-02 DIAGNOSIS — K760 Fatty (change of) liver, not elsewhere classified: Secondary | ICD-10-CM | POA: Diagnosis present

## 2015-11-02 DIAGNOSIS — C187 Malignant neoplasm of sigmoid colon: Secondary | ICD-10-CM | POA: Diagnosis present

## 2015-11-02 DIAGNOSIS — Z01812 Encounter for preprocedural laboratory examination: Secondary | ICD-10-CM

## 2015-11-02 DIAGNOSIS — Z9221 Personal history of antineoplastic chemotherapy: Secondary | ICD-10-CM | POA: Diagnosis not present

## 2015-11-02 DIAGNOSIS — C7800 Secondary malignant neoplasm of unspecified lung: Secondary | ICD-10-CM | POA: Diagnosis present

## 2015-11-02 DIAGNOSIS — N200 Calculus of kidney: Secondary | ICD-10-CM | POA: Diagnosis present

## 2015-11-02 DIAGNOSIS — C189 Malignant neoplasm of colon, unspecified: Secondary | ICD-10-CM | POA: Diagnosis present

## 2015-11-02 HISTORY — PX: LAPAROSCOPIC PARTIAL COLECTOMY: SHX5907

## 2015-11-02 LAB — TYPE AND SCREEN
ABO/RH(D): O POS
ANTIBODY SCREEN: NEGATIVE

## 2015-11-02 SURGERY — LAPAROSCOPIC PARTIAL COLECTOMY
Anesthesia: General | Site: Abdomen

## 2015-11-02 MED ORDER — DIPHENHYDRAMINE HCL 50 MG/ML IJ SOLN: 12.5000 mg | Freq: Four times a day (QID) | INTRAMUSCULAR | Status: DC | PRN

## 2015-11-02 MED ORDER — CEFOTETAN DISODIUM-DEXTROSE 2-2.08 GM-% IV SOLR
INTRAVENOUS | Status: AC
  Filled 2015-11-02: qty 50

## 2015-11-02 MED ORDER — ONDANSETRON HCL 4 MG/2ML IJ SOLN: 4.0000 mg | Freq: Four times a day (QID) | INTRAMUSCULAR | Status: DC | PRN

## 2015-11-02 MED ORDER — HYDROMORPHONE HCL 1 MG/ML IJ SOLN
0.2500 mg | INTRAMUSCULAR | Status: DC | PRN
  Administered 2015-11-02 (×2): 0.5 mg via INTRAVENOUS

## 2015-11-02 MED ORDER — ROCURONIUM BROMIDE 100 MG/10ML IV SOLN
INTRAVENOUS | Status: AC
  Filled 2015-11-02: qty 1

## 2015-11-02 MED ORDER — MIDAZOLAM HCL 2 MG/2ML IJ SOLN
INTRAMUSCULAR | Status: AC
  Filled 2015-11-02: qty 2

## 2015-11-02 MED ORDER — ONDANSETRON HCL 4 MG/2ML IJ SOLN
INTRAMUSCULAR | Status: AC
  Filled 2015-11-02: qty 4

## 2015-11-02 MED ORDER — BUPIVACAINE HCL (PF) 0.5 % IJ SOLN
INTRAMUSCULAR | Status: AC
  Filled 2015-11-02: qty 30

## 2015-11-02 MED ORDER — SODIUM CHLORIDE 0.9% FLUSH: 9.0000 mL | INTRAVENOUS | Status: DC | PRN

## 2015-11-02 MED ORDER — ROCURONIUM BROMIDE 100 MG/10ML IV SOLN
INTRAVENOUS | Status: DC | PRN
  Administered 2015-11-02: 20 mg via INTRAVENOUS
  Administered 2015-11-02: 60 mg via INTRAVENOUS
  Administered 2015-11-02: 10 mg via INTRAVENOUS
  Administered 2015-11-02: 5 mg via INTRAVENOUS

## 2015-11-02 MED ORDER — MIDAZOLAM HCL 5 MG/5ML IJ SOLN
INTRAMUSCULAR | Status: DC | PRN
  Administered 2015-11-02: 2 mg via INTRAVENOUS

## 2015-11-02 MED ORDER — HEPARIN SODIUM (PORCINE) 5000 UNIT/ML IJ SOLN
5000.0000 [IU] | Freq: Three times a day (TID) | INTRAMUSCULAR | Status: DC
  Administered 2015-11-04 – 2015-11-07 (×10): 5000 [IU] via SUBCUTANEOUS
  Filled 2015-11-02 (×13): qty 1

## 2015-11-02 MED ORDER — LACTATED RINGERS IV SOLN
INTRAVENOUS | Status: DC
  Administered 2015-11-02: 1000 mL via INTRAVENOUS
  Administered 2015-11-02: 14:00:00 via INTRAVENOUS

## 2015-11-02 MED ORDER — FENTANYL CITRATE (PF) 100 MCG/2ML IJ SOLN
INTRAMUSCULAR | Status: AC
  Filled 2015-11-02: qty 2

## 2015-11-02 MED ORDER — PHENYLEPHRINE HCL 10 MG/ML IJ SOLN
INTRAMUSCULAR | Status: DC | PRN
  Administered 2015-11-02: 80 ug via INTRAVENOUS

## 2015-11-02 MED ORDER — PROPOFOL 10 MG/ML IV BOLUS
INTRAVENOUS | Status: AC
  Filled 2015-11-02: qty 20

## 2015-11-02 MED ORDER — 0.9 % SODIUM CHLORIDE (POUR BTL) OPTIME
TOPICAL | Status: DC | PRN
  Administered 2015-11-02 (×3): 1000 mL

## 2015-11-02 MED ORDER — DIPHENHYDRAMINE HCL 12.5 MG/5ML PO ELIX: 12.5000 mg | ORAL_SOLUTION | Freq: Four times a day (QID) | ORAL | Status: DC | PRN

## 2015-11-02 MED ORDER — PROPOFOL 10 MG/ML IV BOLUS
INTRAVENOUS | Status: DC | PRN
  Administered 2015-11-02: 200 mg via INTRAVENOUS

## 2015-11-02 MED ORDER — ONDANSETRON HCL 4 MG/2ML IJ SOLN
INTRAMUSCULAR | Status: DC | PRN
  Administered 2015-11-02 (×2): 2 mg via INTRAVENOUS

## 2015-11-02 MED ORDER — SUGAMMADEX SODIUM 200 MG/2ML IV SOLN
INTRAVENOUS | Status: DC | PRN
  Administered 2015-11-02: 200 mg via INTRAVENOUS

## 2015-11-02 MED ORDER — PANTOPRAZOLE SODIUM 40 MG IV SOLR
40.0000 mg | INTRAVENOUS | Status: DC
  Administered 2015-11-02 – 2015-11-06 (×5): 40 mg via INTRAVENOUS
  Filled 2015-11-02 (×7): qty 40

## 2015-11-02 MED ORDER — DEXTROSE 5 % IV SOLN
2.0000 g | Freq: Two times a day (BID) | INTRAVENOUS | Status: AC
  Administered 2015-11-03: 2 g via INTRAVENOUS
  Filled 2015-11-02: qty 2

## 2015-11-02 MED ORDER — FENTANYL CITRATE (PF) 100 MCG/2ML IJ SOLN
INTRAMUSCULAR | Status: DC | PRN
  Administered 2015-11-02: 50 ug via INTRAVENOUS
  Administered 2015-11-02 (×2): 100 ug via INTRAVENOUS
  Administered 2015-11-02 (×2): 50 ug via INTRAVENOUS

## 2015-11-02 MED ORDER — ALVIMOPAN 12 MG PO CAPS
12.0000 mg | ORAL_CAPSULE | Freq: Two times a day (BID) | ORAL | Status: DC
  Administered 2015-11-03 (×2): 12 mg via ORAL
  Filled 2015-11-02 (×5): qty 1

## 2015-11-02 MED ORDER — LACTATED RINGERS IR SOLN
Status: DC | PRN
  Administered 2015-11-02: 1000 mL

## 2015-11-02 MED ORDER — HYDROMORPHONE HCL 1 MG/ML IJ SOLN
INTRAMUSCULAR | Status: DC | PRN
  Administered 2015-11-02 (×2): 0.5 mg via INTRAVENOUS

## 2015-11-02 MED ORDER — DEXAMETHASONE SODIUM PHOSPHATE 10 MG/ML IJ SOLN
INTRAMUSCULAR | Status: AC
  Filled 2015-11-02: qty 1

## 2015-11-02 MED ORDER — MORPHINE SULFATE 2 MG/ML IV SOLN
INTRAVENOUS | Status: DC
  Administered 2015-11-02: 2.25 mg via INTRAVENOUS
  Administered 2015-11-02: 1.5 mg via INTRAVENOUS
  Administered 2015-11-03: 5.25 mg via INTRAVENOUS
  Administered 2015-11-03: 3.75 mL via INTRAVENOUS
  Administered 2015-11-03: 1.5 mL via INTRAVENOUS
  Administered 2015-11-03: 4.5 mg via INTRAVENOUS
  Administered 2015-11-03: 3 mg via INTRAVENOUS
  Administered 2015-11-03: 0.25 mg via INTRAVENOUS
  Administered 2015-11-03: 10.5 mg via INTRAVENOUS
  Administered 2015-11-04 (×2): 6 mg via INTRAVENOUS
  Administered 2015-11-04: 4.5 mg via INTRAVENOUS
  Administered 2015-11-04: 9 mg via INTRAVENOUS
  Administered 2015-11-04: 4.5 mg via INTRAVENOUS
  Administered 2015-11-05: 33.3 mg via INTRAVENOUS
  Administered 2015-11-05: 0 mg via INTRAVENOUS
  Administered 2015-11-05: 1.5 mg via INTRAVENOUS
  Administered 2015-11-06: 0 mg via INTRAVENOUS
  Filled 2015-11-02: qty 25

## 2015-11-02 MED ORDER — ONDANSETRON HCL 4 MG PO TABS: 4.0000 mg | ORAL_TABLET | Freq: Four times a day (QID) | ORAL | Status: DC | PRN

## 2015-11-02 MED ORDER — CEFOTETAN DISODIUM-DEXTROSE 2-2.08 GM-% IV SOLR
2.0000 g | INTRAVENOUS | Status: AC
  Administered 2015-11-02: 2 g via INTRAVENOUS

## 2015-11-02 MED ORDER — SUGAMMADEX SODIUM 200 MG/2ML IV SOLN
INTRAVENOUS | Status: AC
  Filled 2015-11-02: qty 2

## 2015-11-02 MED ORDER — HYDROMORPHONE HCL 1 MG/ML IJ SOLN
INTRAMUSCULAR | Status: AC
  Filled 2015-11-02: qty 1

## 2015-11-02 MED ORDER — ALVIMOPAN 12 MG PO CAPS
12.0000 mg | ORAL_CAPSULE | Freq: Once | ORAL | Status: AC
  Administered 2015-11-02: 12 mg via ORAL
  Filled 2015-11-02: qty 1

## 2015-11-02 MED ORDER — LACTATED RINGERS IV SOLN: INTRAVENOUS | Status: DC

## 2015-11-02 MED ORDER — LIDOCAINE HCL (CARDIAC) 20 MG/ML IV SOLN
INTRAVENOUS | Status: AC
  Filled 2015-11-02: qty 5

## 2015-11-02 MED ORDER — LIDOCAINE HCL (CARDIAC) 20 MG/ML IV SOLN
INTRAVENOUS | Status: DC | PRN
  Administered 2015-11-02: 80 mg via INTRAVENOUS

## 2015-11-02 MED ORDER — NALOXONE HCL 0.4 MG/ML IJ SOLN: 0.4000 mg | INTRAMUSCULAR | Status: DC | PRN

## 2015-11-02 MED ORDER — HYDROMORPHONE HCL 2 MG/ML IJ SOLN
INTRAMUSCULAR | Status: AC
  Filled 2015-11-02: qty 1

## 2015-11-02 MED ORDER — DEXAMETHASONE SODIUM PHOSPHATE 10 MG/ML IJ SOLN
INTRAMUSCULAR | Status: DC | PRN
  Administered 2015-11-02: 5 mg via INTRAVENOUS

## 2015-11-02 MED ORDER — FENTANYL CITRATE (PF) 250 MCG/5ML IJ SOLN
INTRAMUSCULAR | Status: AC
  Filled 2015-11-02: qty 5

## 2015-11-02 MED ORDER — ONDANSETRON HCL 4 MG/2ML IJ SOLN: 4.0000 mg | INTRAMUSCULAR | Status: DC | PRN

## 2015-11-02 MED ORDER — KCL IN DEXTROSE-NACL 20-5-0.9 MEQ/L-%-% IV SOLN
INTRAVENOUS | Status: DC
  Administered 2015-11-03 – 2015-11-05 (×5): via INTRAVENOUS
  Filled 2015-11-02 (×9): qty 1000

## 2015-11-02 SURGICAL SUPPLY — 82 items
APPLIER CLIP 5 13 M/L LIGAMAX5 (MISCELLANEOUS)
APPLIER CLIP ROT 10 11.4 M/L (STAPLE)
BENZOIN TINCTURE PRP APPL 2/3 (GAUZE/BANDAGES/DRESSINGS) ×3 IMPLANT
BLADE EXTENDED COATED 6.5IN (ELECTRODE) IMPLANT
CABLE HIGH FREQUENCY MONO STRZ (ELECTRODE) ×3 IMPLANT
CELLS DAT CNTRL 66122 CELL SVR (MISCELLANEOUS) IMPLANT
CHLORAPREP W/TINT 26ML (MISCELLANEOUS) ×3 IMPLANT
CLIP APPLIE 5 13 M/L LIGAMAX5 (MISCELLANEOUS) IMPLANT
CLIP APPLIE ROT 10 11.4 M/L (STAPLE) IMPLANT
CLOSURE WOUND 1/2 X4 (GAUZE/BANDAGES/DRESSINGS) ×1
COUNTER NEEDLE 20 DBL MAG RED (NEEDLE) ×3 IMPLANT
COVER MAYO STAND STRL (DRAPES) ×9 IMPLANT
COVER SURGICAL LIGHT HANDLE (MISCELLANEOUS) IMPLANT
DECANTER SPIKE VIAL GLASS SM (MISCELLANEOUS) ×3 IMPLANT
DISSECTOR BLUNT TIP ENDO 5MM (MISCELLANEOUS) IMPLANT
DRAIN CHANNEL 19F RND (DRAIN) ×3 IMPLANT
DRAPE LAPAROSCOPIC ABDOMINAL (DRAPES) ×3 IMPLANT
DRAPE LG THREE QUARTER DISP (DRAPES) ×3 IMPLANT
DRAPE SURG IRRIG POUCH 19X23 (DRAPES) IMPLANT
DRAPE UTILITY XL STRL (DRAPES) IMPLANT
DRSG OPSITE POSTOP 4X10 (GAUZE/BANDAGES/DRESSINGS) IMPLANT
DRSG OPSITE POSTOP 4X6 (GAUZE/BANDAGES/DRESSINGS) IMPLANT
DRSG OPSITE POSTOP 4X8 (GAUZE/BANDAGES/DRESSINGS) ×3 IMPLANT
DRSG TEGADERM 2-3/8X2-3/4 SM (GAUZE/BANDAGES/DRESSINGS) IMPLANT
ELECT PENCIL ROCKER SW 15FT (MISCELLANEOUS) ×6 IMPLANT
ELECT REM PT RETURN 15FT ADLT (MISCELLANEOUS) ×3 IMPLANT
EVACUATOR SILICONE 100CC (DRAIN) ×3 IMPLANT
FILTER SMOKE EVAC LAPAROSHD (FILTER) IMPLANT
GAUZE SPONGE 2X2 8PLY STRL LF (GAUZE/BANDAGES/DRESSINGS) IMPLANT
GAUZE SPONGE 4X4 12PLY STRL (GAUZE/BANDAGES/DRESSINGS) ×3 IMPLANT
GLOVE ECLIPSE 8.0 STRL XLNG CF (GLOVE) ×6 IMPLANT
GLOVE INDICATOR 8.0 STRL GRN (GLOVE) ×6 IMPLANT
GOWN STRL REUS W/TWL XL LVL3 (GOWN DISPOSABLE) ×12 IMPLANT
HANDLE SUCTION POOLE (INSTRUMENTS) ×1 IMPLANT
LEGGING LITHOTOMY PAIR STRL (DRAPES) ×3 IMPLANT
LIGASURE IMPACT 36 18CM CVD LR (INSTRUMENTS) ×3 IMPLANT
MANIFOLD NEPTUNE II (INSTRUMENTS) ×3 IMPLANT
PACK COLON (CUSTOM PROCEDURE TRAY) ×3 IMPLANT
PAD POSITIONING PINK XL (MISCELLANEOUS) ×3 IMPLANT
PORT LAP GEL ALEXIS MED 5-9CM (MISCELLANEOUS) IMPLANT
POSITIONER SURGICAL ARM (MISCELLANEOUS) IMPLANT
RELOAD PROXIMATE 75MM BLUE (ENDOMECHANICALS) ×3 IMPLANT
RTRCTR WOUND ALEXIS 18CM MED (MISCELLANEOUS)
SCISSORS LAP 5X35 DISP (ENDOMECHANICALS) ×3 IMPLANT
SET IRRIG TUBING LAPAROSCOPIC (IRRIGATION / IRRIGATOR) IMPLANT
SHEARS HARMONIC ACE PLUS 36CM (ENDOMECHANICALS) IMPLANT
SHEARS HARMONIC ACE PLUS 45CM (MISCELLANEOUS) IMPLANT
SLEEVE XCEL OPT CAN 5 100 (ENDOMECHANICALS) ×12 IMPLANT
SPONGE DRAIN TRACH 4X4 STRL 2S (GAUZE/BANDAGES/DRESSINGS) ×3 IMPLANT
SPONGE GAUZE 2X2 STER 10/PKG (GAUZE/BANDAGES/DRESSINGS)
SPONGE LAP 18X18 X RAY DECT (DISPOSABLE) ×6 IMPLANT
STAPLER CIRC CVD 29MM 37CM (STAPLE) ×3 IMPLANT
STAPLER CUT CVD 40MM GREEN (STAPLE) ×3 IMPLANT
STAPLER PROXIMATE 75MM BLUE (STAPLE) ×3 IMPLANT
STAPLER VISISTAT 35W (STAPLE) ×3 IMPLANT
STRIP CLOSURE SKIN 1/2X4 (GAUZE/BANDAGES/DRESSINGS) ×2 IMPLANT
SUCTION POOLE HANDLE (INSTRUMENTS) ×3
SUT ETHILON 3 0 PS 1 (SUTURE) ×3 IMPLANT
SUT MNCRL AB 4-0 PS2 18 (SUTURE) ×3 IMPLANT
SUT PDS AB 1 CTX 36 (SUTURE) IMPLANT
SUT PDS AB 1 TP1 96 (SUTURE) IMPLANT
SUT PROLENE 2 0 KS (SUTURE) IMPLANT
SUT PROLENE 2 0 SH DA (SUTURE) ×3 IMPLANT
SUT SILK 2 0 (SUTURE) ×2
SUT SILK 2 0 SH CR/8 (SUTURE) ×3 IMPLANT
SUT SILK 2-0 18XBRD TIE 12 (SUTURE) ×1 IMPLANT
SUT SILK 3 0 (SUTURE) ×2
SUT SILK 3 0 SH CR/8 (SUTURE) ×3 IMPLANT
SUT SILK 3-0 18XBRD TIE 12 (SUTURE) ×1 IMPLANT
SUT VICRYL 2 0 18  UND BR (SUTURE)
SUT VICRYL 2 0 18 UND BR (SUTURE) IMPLANT
SYS LAPSCP GELPORT 120MM (MISCELLANEOUS) ×3
SYSTEM LAPSCP GELPORT 120MM (MISCELLANEOUS) ×1 IMPLANT
TAPE CLOTH 4X10 WHT NS (GAUZE/BANDAGES/DRESSINGS) IMPLANT
TOWEL OR 17X26 10 PK STRL BLUE (TOWEL DISPOSABLE) IMPLANT
TOWEL OR NON WOVEN STRL DISP B (DISPOSABLE) ×3 IMPLANT
TRAY FOLEY W/METER SILVER 14FR (SET/KITS/TRAYS/PACK) ×3 IMPLANT
TRAY FOLEY W/METER SILVER 16FR (SET/KITS/TRAYS/PACK) ×3 IMPLANT
TROCAR BLADELESS OPT 5 100 (ENDOMECHANICALS) ×3 IMPLANT
TROCAR XCEL BLUNT TIP 100MML (ENDOMECHANICALS) IMPLANT
TROCAR XCEL NON-BLD 11X100MML (ENDOMECHANICALS) IMPLANT
TUBING INSUF HEATED (TUBING) ×3 IMPLANT

## 2015-11-02 NOTE — Transfer of Care (Signed)
Immediate Anesthesia Transfer of Care Note  Patient: Bill Wright  Procedure(s) Performed: Procedure(s): LAPAROSCOPIC ASSISTED SIGMOID COLECTOMY (N/A)  Patient Location: PACU  Anesthesia Type:General  Level of Consciousness: awake, alert , oriented and patient cooperative  Airway & Oxygen Therapy: Patient Spontanous Breathing and Patient connected to face mask oxygen  Post-op Assessment: Report given to RN, Post -op Vital signs reviewed and stable and Patient moving all extremities  Post vital signs: Reviewed and stable  Last Vitals:  Filed Vitals:   11/02/15 1102  BP: 137/92  Pulse: 96  Temp: 36.7 C  Resp: 18    Last Pain: There were no vitals filed for this visit.    Patients Stated Pain Goal: 4 ([id number])  Complications: No apparent anesthesia complications

## 2015-11-02 NOTE — Anesthesia Preprocedure Evaluation (Signed)
Anesthesia Evaluation  Patient identified by MRN, date of birth, ID band Patient awake    Reviewed: Allergy & Precautions, H&P , NPO status , Patient's Chart, lab work & pertinent test results, reviewed documented beta blocker date and time   Airway Mallampati: II  TM Distance: >3 FB Neck ROM: full    Dental no notable dental hx. (+) Teeth Intact, Dental Advisory Given   Pulmonary neg pulmonary ROS,    Pulmonary exam normal breath sounds clear to auscultation       Cardiovascular Exercise Tolerance: Good negative cardio ROS Normal cardiovascular exam Rhythm:regular Rate:Normal     Neuro/Psych negative neurological ROS  negative psych ROS   GI/Hepatic Neg liver ROS, Colon cancer   Endo/Other  negative endocrine ROS  Renal/GU negative Renal ROS  negative genitourinary   Musculoskeletal   Abdominal   Peds  Hematology negative hematology ROS (+)   Anesthesia Other Findings   Reproductive/Obstetrics negative OB ROS                             Anesthesia Physical Anesthesia Plan  ASA: II  Anesthesia Plan: General   Post-op Pain Management:    Induction: Intravenous  Airway Management Planned: Oral ETT  Additional Equipment:   Intra-op Plan:   Post-operative Plan: Extubation in OR  Informed Consent: I have reviewed the patients History and Physical, chart, labs and discussed the procedure including the risks, benefits and alternatives for the proposed anesthesia with the patient or authorized representative who has indicated his/her understanding and acceptance.   Dental Advisory Given  Plan Discussed with: CRNA and Surgeon  Anesthesia Plan Comments:         Anesthesia Quick Evaluation

## 2015-11-02 NOTE — H&P (Signed)
H & P  He was seen by me in September 2016 with a diagnosis of sigmoid colon cancer. Staging CTs demonstrated metastasis to his lungs. He subsequently has undergone chemotherapy with good results. His last dose of Panitumumab was 2 weeks ago. Dr. Burr Medico has sent him here to discuss resection of the primary sigmoid colon cancer. He is feeling good and is interested in proceeding with the surgery. He is eating well. He is working. His bowels are moving.  Recent PET scan results are below.  Allergies  No Known Drug Allergies   Prior to Admission medications   Medication Sig Start Date End Date Taking? Authorizing Provider  minocycline (MINOCIN) 100 MG capsule Take 1 capsule (100 mg total) by mouth 2 (two) times daily. 10/11/15  Yes Truitt Merle, MD  clindamycin (CLINDAGEL) 1 % gel Apply topically 2 (two) times daily. 08/10/15   Truitt Merle, MD  hydrocortisone 2.5 % lotion Apply topically 2 (two) times daily. 08/10/15   Truitt Merle, MD  ondansetron (ZOFRAN) 8 MG tablet Take 1 tablet (8 mg total) by mouth 2 (two) times daily. Start the day after chemo for 2 days. Then take as needed for nausea or vomiting. Patient not taking: Reported on 10/17/2015 06/13/15   Truitt Merle, MD      Physical Exam  The physical exam findings are as follows: Note:General: Overweight male in NAD. Pleasant and cooperative.  HEENT: [place]/AT, no facial masses  EYES: EOMI, no icterus  NECK: Supple, no obvious mass.  CV: RRR, no murmur, no JVD.  CHEST: Breath sounds equal and clear. Respirations nonlabored.  ABDOMEN: Soft, nontender, nondistended, no masses, no organomegaly, no scars, no hernias.  MUSCULOSKELETAL: FROM, good muscle tone, no edema, no venous stasis changes  LYMPHATIC: No palpable cervical, supraclavicular, adenopathy.  SKIN: No jaundice, punctate truncal rashes.  NEUROLOGIC: Alert and oriented, answers questions appropriately, normal gait and station.  PSYCHIATRIC: Normal mood, affect , and  behavior.  IMPRESSION: 1. Hypermetabolic pulmonary metastases have enlarged slightly from 07/18/2015. 2. Hypermetabolic porta hepatis/abdominal peritoneal ligament lymph nodes, stable from 04/12/2015. 3. Increase in hypermetabolism associated with mesenteric haziness and nodularity. While a reactive phenomenon/panniculitis can create this in appearance, metastatic disease/lymphoproliferative disorder cannot be excluded. 4. Hepatic steatosis. 5. Bilateral nephrolithiasis.   Assessment & Plan   MALIGNANT NEOPLASM OF SIGMOID COLON (C18.7) Impression: Stage IV disease. Has completed his chemotherapy. Took his last Panitumumab 2 weeks ago. He is interested in proceeding with sigmoid colectomy to remove the primary tumor.  Plan: Laparoscopic assisted partial colectomy. I have explained the procedure and risks of colon resection. Risks include but are not limited to bleeding, infection, wound problems, anesthesia, anastomotic leak, need for colostomy, need for reoperative surgery, injury to intraabominal organs (such as intestine, spleen, kidney, bladder, ureter, etc.), ileus, irregular bowel habits. He seems to understand and wants to proceed.  Jackolyn Confer, MD

## 2015-11-02 NOTE — Anesthesia Procedure Notes (Signed)
Procedure Name: Intubation Date/Time: 11/02/2015 1:54 PM Performed by: Freddie Breech Pre-anesthesia Checklist: Emergency Drugs available, Suction available, Patient being monitored, Patient identified and Timeout performed Patient Re-evaluated:Patient Re-evaluated prior to inductionOxygen Delivery Method: Circle system utilized Preoxygenation: Pre-oxygenation with 100% oxygen Intubation Type: IV induction Ventilation: Oral airway inserted - appropriate to patient size Laryngoscope Size: Mac and 4 Grade View: Grade II Tube type: Oral Tube size: 7.5 mm Number of attempts: 1 Airway Equipment and Method: Patient positioned with wedge pillow and Stylet Placement Confirmation: ETT inserted through vocal cords under direct vision,  positive ETCO2,  CO2 detector and breath sounds checked- equal and bilateral Secured at: 22 cm Tube secured with: Tape Dental Injury: Teeth and Oropharynx as per pre-operative assessment

## 2015-11-02 NOTE — Anesthesia Postprocedure Evaluation (Signed)
Anesthesia Post Note  Patient: Bill Wright  Procedure(s) Performed: Procedure(s) (LRB): LAPAROSCOPIC ASSISTED SIGMOID COLECTOMY (N/A)  Patient location during evaluation: PACU Anesthesia Type: General Level of consciousness: awake and alert Pain management: pain level controlled Vital Signs Assessment: post-procedure vital signs reviewed and stable Respiratory status: spontaneous breathing, nonlabored ventilation, respiratory function stable and patient connected to nasal cannula oxygen Cardiovascular status: blood pressure returned to baseline and stable Postop Assessment: no signs of nausea or vomiting Anesthetic complications: no    Last Vitals:  Filed Vitals:   11/02/15 1815 11/02/15 1856  BP: 114/79 129/70  Pulse: 98 88  Temp:  36.8 C  Resp: 16 16    Last Pain:  Filed Vitals:   11/02/15 1857  PainSc: 5                  Jersie Beel L

## 2015-11-02 NOTE — Op Note (Signed)
Operative Note  Bill Wright male 55 y.o. 11/02/2015  PREOPERATIVE DX:  Stage IV sigmoid colon cancer  POSTOPERATIVE DX:  Same  PROCEDURE:   Laparoscopic-assisted sigmoid colectomy         Surgeon: Odis Hollingshead   Assistants: Nedra Hai M.D.  Anesthesia: General endotracheal anesthesia  Indications:   This is a 55 year old male with stage IV colon cancer with metastasis to his lungs.  The primary lesion is in his sigmoid colon. He does not have any obstructive symptoms. A total long firm lesion. He's been treated with chemotherapy case felt to be a good candidate to have a palliative resection to avoid bleeding or obstructive complications. He presents now for that.    Procedure Detail:  He was brought to the operating room and placed supine on the operating table and a general anesthetic was given. A Foley catheter was inserted. He was placed in the lithotomy position. The hair in the abdominal wall was clipped. An oral gastric tube was placed. The abdominal wall perineal areas were sterilely prepped and draped. A timeout was performed.  He was placed in slight reverse Trendelenburg position. A 5 mm left subcostal incision was made. Using a 5 mm Optiview trocar and laparoscope, access was gained into the peritoneal cavity. Inspection of the area underneath the trocar demonstrated no evidence of bleeding or organ injury.  I inspected the abdominal cavity and saw no evidence of peritoneal implants or liver metastasis. Significant fatty infiltration changes of the liver were noted.  A 5 mm trocar was placed just above the umbilicus in the midline. A 5 mm trocar was placed in the right lower quadrant. A 5 mm trocar was placed in the lower midline. A 5 mm trocar was placed in the left lower quadrant.  I identified the left and sigmoid colon. The tattoo mark with a tumor was was noted in the distal sigmoid colon. I mobilized the sigmoid colon and left colon by dividing lateral  attachments using the Harmonic scalpel up toward the spleen. There is some redundancy of the descending and proximal sigmoid colon. I then began medializing the colon using blunt dissection. I then extended some of the mobilization down into the pelvis. I had good mobility of the descending and sigmoid colon at this time. I made a limited lower midline incision around the area of the 5 mm trocar dividing all layers and entering the peritoneal cavity. A wound protection device was placed. I was able to palpate tumor. I further mobilized the rectum by dividing some lateral and posterior attachments. I then divided the sigmoid colon at its junction with the descending colon junction with the linear cutting stapler. The left ureter was identified and traced into the pelvis. The plane of dissection was anterior and medial to this. I chose an area in the proximal rectum distal to the tumor and cleaned off the mesentery circumferentially. I then divided the mesentery beginning at the point of division of the sigmoid colon and descending colon and heading down into the pelvis. I then divided the rectum using the linear cutting stapler. The specimen was passed off the field. The tumor was easily palpable. The distal margin was grossly negative for a distance of 3-4 cm by palpation.    I then removed the staple line on the descending colon. A size 29 EEA anvil was then placed into the lumen of the colon. The colon defect was then closed with a linear cutting stapler. I brought the anvil out the side  of the colon and secured in place with a 2-0 Prolene pursestring suture. The handle of the stapler that was then passed into the anus and up to the rectum. An end rectum to side colon (Baker-type anastomosis) was then performed with the EEA stapler. There were 2 complete donuts noted and the distal-most doughnut was sent for the distal margin. The air leak test was performed and there was no evidence of an air leak. Anastomosis  was patent and viable and under no tension.  The abdominal cavity was irrigated with saline solution. There is no evidence of bleeding or organ injury. Gloves, gowns, and instruments were changed. A 19 Blake drain was passed through the left lower quadrant trocar incision and placed into the pelvis. It was anchored to the skin with 3-0 nylon suture. I then closed the peritoneum of the limited lower midline incision with running 2-0 Vicryl suture. The fascia was closed with running double-stranded #1 PDS suture. Needle sponge and instrument counts were reported to be correct. Repeat laparoscopy demonstrated a solid fascial closure. 4 quadrant inspection demonstrated no evidence of bleeding or organ injury.  The lower midline incision skin was closed with staples. The trocar site skin incisions were closed with 4-0 Monocryl subcuticular stitches. Steri-Strips and sterile dressings were applied.  He tolerated the procedure well without any apparent complications. He was taken to the recovery room in satisfactory condition.  Estimated Blood Loss:  300 mL         Drains: #19 round Blake drain         Specimens: sigmoid colon        Complications:  * No complications entered in OR log *         Disposition: PACU - hemodynamically stable.         Condition: stable

## 2015-11-02 NOTE — Interval H&P Note (Signed)
History and Physical Interval Note:  11/02/2015 1:32 PM  Bill Wright  has presented today for surgery, with the diagnosis of colon cancer  The various methods of treatment have been discussed with the patient and family. After consideration of risks, benefits and other options for treatment, the patient has consented to  Procedure(s): LAPAROSCOPIC ASSISTED PARTIAL COLECTOMY (N/A) as a surgical intervention .  The patient's history has been reviewed, patient examined, no change in status, stable for surgery.  I have reviewed the patient's chart and labs.  Questions were answered to the patient's satisfaction.     Manda Holstad Lenna Sciara

## 2015-11-03 ENCOUNTER — Encounter (HOSPITAL_COMMUNITY): Payer: Self-pay | Admitting: General Surgery

## 2015-11-03 LAB — CBC
HCT: 38.3 % — ABNORMAL LOW (ref 39.0–52.0)
Hemoglobin: 12.9 g/dL — ABNORMAL LOW (ref 13.0–17.0)
MCH: 29.3 pg (ref 26.0–34.0)
MCHC: 33.7 g/dL (ref 30.0–36.0)
MCV: 87 fL (ref 78.0–100.0)
PLATELETS: 236 10*3/uL (ref 150–400)
RBC: 4.4 MIL/uL (ref 4.22–5.81)
RDW: 13.3 % (ref 11.5–15.5)
WBC: 13.3 10*3/uL — AB (ref 4.0–10.5)

## 2015-11-03 LAB — BASIC METABOLIC PANEL
Anion gap: 10 (ref 5–15)
BUN: 9 mg/dL (ref 6–20)
CHLORIDE: 103 mmol/L (ref 101–111)
CO2: 25 mmol/L (ref 22–32)
CREATININE: 0.84 mg/dL (ref 0.61–1.24)
Calcium: 8.7 mg/dL — ABNORMAL LOW (ref 8.9–10.3)
GFR calc non Af Amer: >60 mL/min (ref >60)
GLUCOSE: 158 mg/dL — AB (ref 65–99)
Potassium: 4.4 mmol/L (ref 3.5–5.1)
Sodium: 138 mmol/L (ref 135–145)

## 2015-11-03 MED ORDER — BOOST / RESOURCE BREEZE PO LIQD
1.0000 | Freq: Three times a day (TID) | ORAL | Status: DC
  Administered 2015-11-03 – 2015-11-05 (×3): 1 via ORAL

## 2015-11-03 NOTE — Progress Notes (Signed)
8 mg (4 ML) of IV morphine from PCA wasted into sink at 2348 11/03/15. Witnessed by Lavon Paganini RN.

## 2015-11-03 NOTE — Care Management Note (Signed)
Case Management Note  Patient Details  Name: Bill Wright MRN: [id number] Date of Birth: [birth year]  Subjective/Objective: 55 y/o m admitted w/Colon Ca mets to lung.POD#1 lap sig colectomy. From home.                   Action/Plan:d/c plan home.   Expected Discharge Date:                  Expected Discharge Plan:  Home/Self Care  In-House Referral:     Discharge planning Services  CM Consult  Post Acute Care Choice:    Choice offered to:     DME Arranged:    DME Agency:     HH Arranged:    HH Agency:     Status of Service:  In process, will continue to follow  Medicare Important Message Given:    Date Medicare IM Given:    Medicare IM give by:    Date Additional Medicare IM Given:    Additional Medicare Important Message give by:     If discussed at [place] of Stay Meetings, dates discussed:    Additional Comments:  Dessa Phi, RN 11/03/2015, 12:24 PM

## 2015-11-03 NOTE — Progress Notes (Signed)
1 Day Post-Op  Subjective: Awake and alert.  Pain well controlled.  No nausea.  Feels a little bloated.  Operation discussed with him.  Objective: Vital signs in last 24 hours: Temp:  [97.7 F (36.5 C)-98.3 F (36.8 C)] 98.2 F (36.8 C) (04/28 0534) Pulse Rate:  [84-106] 84 (04/28 0534) Resp:  [14-21] 19 (04/28 0534) BP: (114-139)/(66-92) 124/78 mmHg (04/28 0534) SpO2:  [98 %-100 %] 99 % (04/28 0534) FiO2 (%):  [40 %] 40 % (04/27 1748) Weight:  [96.163 kg (212 lb)] 96.163 kg (212 lb) (04/27 1107)    Intake/Output from previous day: 04/27 0701 - 04/28 0700 In: 3000 [I.V.:3000] Out: 1375 [Urine:1205; Drains:70; Blood:100] Intake/Output this shift:    PE: General- In NAD Abdomen-soft, mild distension, hypoactive bowel sounds, thin serosanguinous drain output, dressings dry  Lab Results:   Recent Labs  10/31/15 1330 11/03/15 0354  WBC 6.6 13.3*  HGB 14.7 12.9*  HCT 41.6 38.3*  PLT 223 236   BMET  Recent Labs  10/31/15 1330 11/03/15 0354  NA 140 138  K 4.2 4.4  CL 103 103  CO2 27 25  GLUCOSE 106* 158*  BUN 13 9  CREATININE 0.71 0.84  CALCIUM 9.3 8.7*   PT/INR  Recent Labs  10/31/15 1330  LABPROT 13.9  INR 1.05   Comprehensive Metabolic Panel:    Component Value Date/Time   NA 138 11/03/2015 0354   NA 140 10/31/2015 1330   NA 139 10/19/2015 1241   NA 140 10/05/2015 1310   K 4.4 11/03/2015 0354   K 4.2 10/31/2015 1330   K 3.6 10/19/2015 1241   K 3.6 10/05/2015 1310   CL 103 11/03/2015 0354   CL 103 10/31/2015 1330   CO2 25 11/03/2015 0354   CO2 27 10/31/2015 1330   CO2 22 10/19/2015 1241   CO2 25 10/05/2015 1310   BUN 9 11/03/2015 0354   BUN 13 10/31/2015 1330   BUN 8.4 10/19/2015 1241   BUN 7.8 10/05/2015 1310   CREATININE 0.84 11/03/2015 0354   CREATININE 0.71 10/31/2015 1330   CREATININE 0.8 10/19/2015 1241   CREATININE 0.8 10/05/2015 1310   GLUCOSE 158* 11/03/2015 0354   GLUCOSE 106* 10/31/2015 1330   GLUCOSE 148* 10/19/2015 1241    GLUCOSE 131 10/05/2015 1310   CALCIUM 8.7* 11/03/2015 0354   CALCIUM 9.3 10/31/2015 1330   CALCIUM 8.9 10/19/2015 1241   CALCIUM 9.4 10/05/2015 1310   AST 38 10/31/2015 1330   AST 27 10/19/2015 1241   AST 32 10/05/2015 1310   AST 35 03/16/2015 1710   ALT 46 10/31/2015 1330   ALT 29 10/19/2015 1241   ALT 38 10/05/2015 1310   ALT 45 03/16/2015 1710   ALKPHOS 96 10/31/2015 1330   ALKPHOS 102 10/19/2015 1241   ALKPHOS 96 10/05/2015 1310   ALKPHOS 81 03/16/2015 1710   BILITOT 0.7 10/31/2015 1330   BILITOT 0.47 10/19/2015 1241   BILITOT 0.47 10/05/2015 1310   BILITOT 0.6 03/16/2015 1710   PROT 7.8 10/31/2015 1330   PROT 7.6 10/19/2015 1241   PROT 8.1 10/05/2015 1310   PROT 7.2 03/16/2015 1710   ALBUMIN 4.1 10/31/2015 1330   ALBUMIN 3.6 10/19/2015 1241   ALBUMIN 3.8 10/05/2015 1310   ALBUMIN 3.7 03/16/2015 1710     Studies/Results: No results found.  Anti-infectives: Anti-infectives    Start     Dose/Rate Route Frequency Ordered Stop   11/03/15 0200  cefoTEtan (CEFOTAN) 2 g in dextrose 5 % 50 mL  IVPB     2 g 100 mL/hr over 30 Minutes Intravenous Every 12 hours 11/02/15 1837 11/03/15 0255   11/02/15 1105  cefoTEtan in Dextrose 5% (CEFOTAN) IVPB 2 g     2 g Intravenous On call to O.R. 11/02/15 1105 11/02/15 1355      Assessment Principal Problem:   Colon cancer metastasized to lung [facility]) s/p laparoscopic assisted sigmoid colectomy 11/02/15-stable overnight    LOS: 1 day   Plan: Mobilize.  Try clear liquids.   Bill Wright 11/03/2015

## 2015-11-03 NOTE — Progress Notes (Signed)
Initial Nutrition Assessment  DOCUMENTATION CODES:   Obesity unspecified  INTERVENTION:  -RD continue to monitor  NUTRITION DIAGNOSIS:   Increased nutrient needs related to chronic illness, cancer and cancer related treatments as evidenced by estimated needs.  GOAL:   Patient will meet greater than or equal to 90% of their needs  MONITOR:   PO intake, Supplement acceptance, I & O's, Labs  REASON FOR ASSESSMENT:   Malnutrition Screening Tool    ASSESSMENT:   He was seen by me in September 2016 with a diagnosis of sigmoid colon cancer. Staging CTs demonstrated metastasis to his lungs. He subsequently has undergone chemotherapy with good results. His last dose of Panitumumab was 2 weeks ago. Dr. Burr Medico has sent him here to discuss resection of the primary sigmoid colon cancer.  Spoke with Mr. Pentico at bedside He has an ok appetite; was good prior to admission but now is so-so 1 day s/p sigmoid colectomy.  He is currently on clears, had a tray at bedside, but stated he was not hungry at the moment. He endorses weight loss of approximately 12# over the course of "a couple months." Chart shows weight stable x4 months. Pt stated that overall since the onset of disease total weight loss is 15# He does endorse taste changes, has tried fruit and other solutions currently has issues with food tasting bland.  Otherwise, patient seems ok. Will continue to follow for diet advancement and PO intake.  Diet Order:  Diet clear liquid Room service appropriate?: Yes; Fluid consistency:: Thin  Skin:  Reviewed, no issues  Last BM:  PTA  Height:   Ht Readings from Last 1 Encounters:  11/02/15 5\' 6"  (1.676 m)    Weight:   Wt Readings from Last 1 Encounters:  11/02/15 212 lb (96.163 kg)    Ideal Body Weight:  64.54 kg  BMI:  Body mass index is 34.23 kg/(m^2).  Estimated Nutritional Needs:   Kcal:  2100-2500 (30-35 cal/kg ABW)  Protein:  90-115 grams  Fluid:  >/=  2.1L  EDUCATION NEEDS:   Education needs addressed  Satira Anis. Daira Hine, MS, RD LDN After Hours/Weekend Pager [phone]

## 2015-11-04 NOTE — Progress Notes (Signed)
2 Days Post-Op  Subjective: Had bm and took a walk and now feels very tired  Objective: Vital signs in last 24 hours: Temp:  [97.9 F (36.6 C)-98.6 F (37 C)] 97.9 F (36.6 C) (04/29 0606) Pulse Rate:  [77-93] 93 (04/29 0606) Resp:  [16-20] 16 (04/29 0741) BP: (125-141)/(74-82) 141/78 mmHg (04/29 0606) SpO2:  [93 %-100 %] 99 % (04/29 0606) FiO2 (%):  [36 %-37 %] 37 % (04/28 1200)    Intake/Output from previous day: 04/28 0701 - 04/29 0700 In: 1743.3 [I.V.:1743.3] Out: 2530 [Urine:2400; Drains:130] Intake/Output this shift:    Resp: clear to auscultation bilaterally Cardio: regular rate and rhythm GI: soft, appropriately tender. incisions look good. good bs  Lab Results:   Recent Labs  11/03/15 0354  WBC 13.3*  HGB 12.9*  HCT 38.3*  PLT 236   BMET  Recent Labs  11/03/15 0354  NA 138  K 4.4  CL 103  CO2 25  GLUCOSE 158*  BUN 9  CREATININE 0.84  CALCIUM 8.7*   PT/INR No results for input(s): LABPROT, INR in the last 72 hours. ABG No results for input(s): PHART, HCO3 in the last 72 hours.  Invalid input(s): PCO2, PO2  Studies/Results: No results found.  Anti-infectives: Anti-infectives    Start     Dose/Rate Route Frequency Ordered Stop   11/03/15 0200  cefoTEtan (CEFOTAN) 2 g in dextrose 5 % 50 mL IVPB     2 g 100 mL/hr over 30 Minutes Intravenous Every 12 hours 11/02/15 1837 11/03/15 0255   11/02/15 1105  cefoTEtan in Dextrose 5% (CEFOTAN) IVPB 2 g     2 g Intravenous On call to O.R. 11/02/15 1105 11/02/15 1355      Assessment/Plan: s/p Procedure(s): LAPAROSCOPIC ASSISTED SIGMOID COLECTOMY (N/A) Stay with clears for now  ambulate  LOS: 2 days    TOTH III,Ashlon Lottman S 11/04/2015

## 2015-11-05 NOTE — Progress Notes (Signed)
3 Days Post-Op  Subjective: Feels tired. Otherwise denies pain. Does feel a little bloated  Objective: Vital signs in last 24 hours: Temp:  [98.1 F (36.7 C)-98.8 F (37.1 C)] 98.8 F (37.1 C) (04/30 0600) Pulse Rate:  [105-117] 105 (04/30 0600) Resp:  [16-22] 18 (04/30 0810) BP: (129-148)/(71-94) 129/71 mmHg (04/30 0600) SpO2:  [93 %-99 %] 96 % (04/30 0810) FiO2 (%):  [36 %] 36 % (04/29 1645) Last BM Date: 11/04/15  Intake/Output from previous day: 04/29 0701 - 04/30 0700 In: 2720 [P.O.:320; I.V.:2400] Out: 1430 [Urine:1300; Drains:130] Intake/Output this shift:    Resp: clear to auscultation bilaterally Cardio: regular rate and rhythm GI: soft, nontender. distended. good bs  Lab Results:   Recent Labs  11/03/15 0354  WBC 13.3*  HGB 12.9*  HCT 38.3*  PLT 236   BMET  Recent Labs  11/03/15 0354  NA 138  K 4.4  CL 103  CO2 25  GLUCOSE 158*  BUN 9  CREATININE 0.84  CALCIUM 8.7*   PT/INR No results for input(s): LABPROT, INR in the last 72 hours. ABG No results for input(s): PHART, HCO3 in the last 72 hours.  Invalid input(s): PCO2, PO2  Studies/Results: No results found.  Anti-infectives: Anti-infectives    Start     Dose/Rate Route Frequency Ordered Stop   11/03/15 0200  cefoTEtan (CEFOTAN) 2 g in dextrose 5 % 50 mL IVPB     2 g 100 mL/hr over 30 Minutes Intravenous Every 12 hours 11/02/15 1837 11/03/15 0255   11/02/15 1105  cefoTEtan in Dextrose 5% (CEFOTAN) IVPB 2 g     2 g Intravenous On call to O.R. 11/02/15 1105 11/02/15 1355      Assessment/Plan: s/p Procedure(s): LAPAROSCOPIC ASSISTED SIGMOID COLECTOMY (N/A) Advance diet. Allow fulls today ambulate  LOS: 3 days    TOTH III,PAUL S 11/05/2015

## 2015-11-06 MED ORDER — BOOST / RESOURCE BREEZE PO LIQD
1.0000 | Freq: Two times a day (BID) | ORAL | Status: DC
Start: 2015-11-07 — End: 2015-11-07

## 2015-11-06 MED ORDER — HYDROCODONE-ACETAMINOPHEN 5-325 MG PO TABS
1.0000 | ORAL_TABLET | ORAL | Status: DC | PRN
  Filled 2015-11-06: qty 1

## 2015-11-06 NOTE — Progress Notes (Signed)
4 Days Post-Op  Subjective: Feels good.  Tolerating diet.  Passing gas.  Bowels moving.  Objective: Vital signs in last 24 hours: Temp:  [98.2 F (36.8 C)-98.7 F (37.1 C)] 98.2 F (36.8 C) (05/01 0650) Pulse Rate:  [91-101] 91 (05/01 0650) Resp:  [18-24] 24 (05/01 0800) BP: (139-156)/(89-103) 145/95 mmHg (05/01 0650) SpO2:  [96 %-99 %] 98 % (05/01 0800) Last BM Date: 11/06/15  Intake/Output from previous day: 04/30 0701 - 05/01 0700 In: 1348.3 [P.O.:240; I.V.:1108.3] Out: 842 [Urine:800; Drains:42] Intake/Output this shift:    PE: General- In NAD Abdomen-soft, incisions clean and intact, serous drain output  Lab Results:  No results for input(s): WBC, HGB, HCT, PLT in the last 72 hours. BMET No results for input(s): NA, K, CL, CO2, GLUCOSE, BUN, CREATININE, CALCIUM in the last 72 hours. PT/INR No results for input(s): LABPROT, INR in the last 72 hours. Comprehensive Metabolic Panel:    Component Value Date/Time   NA 138 11/03/2015 0354   NA 140 10/31/2015 1330   NA 139 10/19/2015 1241   NA 140 10/05/2015 1310   K 4.4 11/03/2015 0354   K 4.2 10/31/2015 1330   K 3.6 10/19/2015 1241   K 3.6 10/05/2015 1310   CL 103 11/03/2015 0354   CL 103 10/31/2015 1330   CO2 25 11/03/2015 0354   CO2 27 10/31/2015 1330   CO2 22 10/19/2015 1241   CO2 25 10/05/2015 1310   BUN 9 11/03/2015 0354   BUN 13 10/31/2015 1330   BUN 8.4 10/19/2015 1241   BUN 7.8 10/05/2015 1310   CREATININE 0.84 11/03/2015 0354   CREATININE 0.71 10/31/2015 1330   CREATININE 0.8 10/19/2015 1241   CREATININE 0.8 10/05/2015 1310   GLUCOSE 158* 11/03/2015 0354   GLUCOSE 106* 10/31/2015 1330   GLUCOSE 148* 10/19/2015 1241   GLUCOSE 131 10/05/2015 1310   CALCIUM 8.7* 11/03/2015 0354   CALCIUM 9.3 10/31/2015 1330   CALCIUM 8.9 10/19/2015 1241   CALCIUM 9.4 10/05/2015 1310   AST 38 10/31/2015 1330   AST 27 10/19/2015 1241   AST 32 10/05/2015 1310   AST 35 03/16/2015 1710   ALT 46 10/31/2015 1330   ALT 29 10/19/2015 1241   ALT 38 10/05/2015 1310   ALT 45 03/16/2015 1710   ALKPHOS 96 10/31/2015 1330   ALKPHOS 102 10/19/2015 1241   ALKPHOS 96 10/05/2015 1310   ALKPHOS 81 03/16/2015 1710   BILITOT 0.7 10/31/2015 1330   BILITOT 0.47 10/19/2015 1241   BILITOT 0.47 10/05/2015 1310   BILITOT 0.6 03/16/2015 1710   PROT 7.8 10/31/2015 1330   PROT 7.6 10/19/2015 1241   PROT 8.1 10/05/2015 1310   PROT 7.2 03/16/2015 1710   ALBUMIN 4.1 10/31/2015 1330   ALBUMIN 3.6 10/19/2015 1241   ALBUMIN 3.8 10/05/2015 1310   ALBUMIN 3.7 03/16/2015 1710     Studies/Results: No results found.  Anti-infectives: Anti-infectives    Start     Dose/Rate Route Frequency Ordered Stop   11/03/15 0200  cefoTEtan (CEFOTAN) 2 g in dextrose 5 % 50 mL IVPB     2 g 100 mL/hr over 30 Minutes Intravenous Every 12 hours 11/02/15 1837 11/03/15 0255   11/02/15 1105  cefoTEtan in Dextrose 5% (CEFOTAN) IVPB 2 g     2 g Intravenous On call to O.R. 11/02/15 1105 11/02/15 1355      Assessment Principal Problem:   Colon cancer metastasized to lung [facility]) s/p laparoscopic assisted sigmoid colectomy 11/02/15-bowel function returning and progressing  well    LOS: 4 days   Plan: Advance to solid diet.  Stop PCA.  Oral analgesic.  Saline lock IV.   Gurfateh Mcclain J 11/06/2015

## 2015-11-06 NOTE — Progress Notes (Signed)
Morphine PCA discontinued, 10cc wasted in sharps with Grace Blight, RN witness

## 2015-11-06 NOTE — Progress Notes (Signed)
Nutrition Follow-up  DOCUMENTATION CODES:   Obesity unspecified  INTERVENTION:  - Continue Boost Breeze but will decrease to BID - RD will continue to monitor for additional nutrition-related needs  NUTRITION DIAGNOSIS:   Increased nutrient needs related to chronic illness, cancer and cancer related treatments as evidenced by estimated needs. -ongoing  GOAL:   Patient will meet greater than or equal to 90% of their needs -unmet currently  MONITOR:   PO intake, Supplement acceptance, I & O's, Labs  ASSESSMENT:   He was seen by me in September 2016 with a diagnosis of sigmoid colon cancer. Staging CTs demonstrated metastasis to his lungs. He subsequently has undergone chemotherapy with good results. His last dose of Panitumumab was 2 weeks ago. Dr. Burr Medico has sent him here to discuss resection of the primary sigmoid colon cancer.  5/1 Per chart review, diet advancement as follows: 4/28 @ 0000: NPO 4/28 @ [id number]: CLD 4/30 @ 0902: FLD 5/1 @ 1209: Soft  Per chart review, pt consumed 0% of all meals 4/29 and no intakes documented from 4/30. Pt reports that for lunch today he had a bowl of cream of potato soup, a cup of chocolate pudding, and ~75% of Boost Breeze. He states that for dinner he ordered pot roast and a dinner roll. Pt denies abdominal pain or nausea with intakes and states that he is tolerating Boost Breeze although he does not particularly care for them. Pt agreeable to continuing to receive Boost Breeze and states that he was intermittently drinking them PTA; will decrease to BID and continue to monitor for further adjustment to nutrition supplements.   Pt not meeting needs at this time. Medication reviewed. Labs reviewed; Ca: 8.7 mg/dL.    4/28 - He has an ok appetite; was good prior to admission but now is so-so 1 day s/p sigmoid colectomy. - He is currently on clears, had a tray at bedside, but stated he was not hungry at the moment. - He endorses weight loss of  approximately 12# over the course of "a couple months."  - Chart shows weight stable x4 months. - Pt stated that overall since the onset of disease total weight loss is 15# - He does endorse taste changes, has tried fruit and other solutions currently has issues with food tasting bland. - Will continue to follow for diet advancement and PO intake.    Diet Order:  DIET SOFT Room service appropriate?: Yes; Fluid consistency:: Thin  Skin:  Reviewed, no issues  Last BM:  5/1  Height:   Ht Readings from Last 1 Encounters:  11/02/15 5\' 6"  (1.676 m)    Weight:   Wt Readings from Last 1 Encounters:  11/02/15 212 lb (96.163 kg)    Ideal Body Weight:  64.54 kg  BMI:  Body mass index is 34.23 kg/(m^2).  Estimated Nutritional Needs:   Kcal:  2100-2500 (30-35 cal/kg ABW)  Protein:  90-115 grams  Fluid:  >/= 2.1L  EDUCATION NEEDS:   Education needs addressed     Jarome Matin, RD, LDN Inpatient Clinical Dietitian Pager # [phone] After hours/weekend pager # [phone]

## 2015-11-07 MED ORDER — HYDROCODONE-ACETAMINOPHEN 5-325 MG PO TABS: 1.0000 | ORAL_TABLET | ORAL | Status: DC | PRN

## 2015-11-07 NOTE — Discharge Instructions (Signed)
Removal of the Colon (Colectomy) Care After  HOME CARE INSTRUCTIONS  Once home, an ice pack applied to the operative site may help with discomfort and keep swelling down.   Change dressings as directed.   Only take over-the-counter or prescription medicines for pain, discomfort, or fever as directed by your caregiver.   Eat a bland, lowfat diet.  Drink plenty of fluids.  Do not overeat.  Do not drive for at least 2-3 weeks and then only if you are no longer taking pain medicine.  There should be no heavy lifting (more than 10 pounds), strenuous activities or contact sports for at least 6 weeks, and then only after approval of your surgeon.   Keep the wound dry and clean.  You may shower. The wound may be washed gently with soap and water. Gently blot or dab dry following cleansing, without rubbing. Do not take baths, use swimming pools, or use hot tubs for 3 weeks, or as instructed by your caregivers.   Place a dry bandage over lower aspect of wound and drain site daily as shown.  Call the office [phone]) for a followup appointment if you do not already have one. SEEK MEDICAL CARE IF (call the office):   There is redness, swelling, or increasing pain in the wound area.   Pus or blood is coming from the wound.   An unexplained oral temperature above 101.5 degrees F develops.   You notice a foul smell coming from the wound or dressing.   There is a breaking open of a wound (edges not staying together) after the sutures have been removed.   There is increasing abdominal pain.   You have persistent vomiting. SEEK IMMEDIATE MEDICAL CARE IF:   A rash develops.   There is difficulty breathing, or development of a reaction or side effects to medications given.  Document Released: 01/23/2004 Document Revised: 03/06/2011 Document Reviewed: 07/28/2007 [facility] Patient Information 2012 [place].

## 2015-11-07 NOTE — Progress Notes (Signed)
5 Days Post-Op  Subjective: Tolerating diet.  Bowels moving.  Objective: Vital signs in last 24 hours: Temp:  [98.2 F (36.8 C)-98.9 F (37.2 C)] 98.2 F (36.8 C) (05/02 0610) Pulse Rate:  [93-102] 93 (05/02 0610) Resp:  [20] 20 (05/02 0610) BP: (120-149)/(77-100) 120/91 mmHg (05/02 0610) SpO2:  [97 %-100 %] 97 % (05/02 0610) Last BM Date: 11/06/15  Intake/Output from previous day: 05/01 0701 - 05/02 0700 In: 240 [P.O.:240] Out: 40 [Drains:40] Intake/Output this shift:    PE: General- In NAD Abdomen-soft, incisions clean and intact, serous drain output (drain removed)  Lab Results:  No results for input(s): WBC, HGB, HCT, PLT in the last 72 hours. BMET No results for input(s): NA, K, CL, CO2, GLUCOSE, BUN, CREATININE, CALCIUM in the last 72 hours. PT/INR No results for input(s): LABPROT, INR in the last 72 hours. Comprehensive Metabolic Panel:    Component Value Date/Time   NA 138 11/03/2015 0354   NA 140 10/31/2015 1330   NA 139 10/19/2015 1241   NA 140 10/05/2015 1310   K 4.4 11/03/2015 0354   K 4.2 10/31/2015 1330   K 3.6 10/19/2015 1241   K 3.6 10/05/2015 1310   CL 103 11/03/2015 0354   CL 103 10/31/2015 1330   CO2 25 11/03/2015 0354   CO2 27 10/31/2015 1330   CO2 22 10/19/2015 1241   CO2 25 10/05/2015 1310   BUN 9 11/03/2015 0354   BUN 13 10/31/2015 1330   BUN 8.4 10/19/2015 1241   BUN 7.8 10/05/2015 1310   CREATININE 0.84 11/03/2015 0354   CREATININE 0.71 10/31/2015 1330   CREATININE 0.8 10/19/2015 1241   CREATININE 0.8 10/05/2015 1310   GLUCOSE 158* 11/03/2015 0354   GLUCOSE 106* 10/31/2015 1330   GLUCOSE 148* 10/19/2015 1241   GLUCOSE 131 10/05/2015 1310   CALCIUM 8.7* 11/03/2015 0354   CALCIUM 9.3 10/31/2015 1330   CALCIUM 8.9 10/19/2015 1241   CALCIUM 9.4 10/05/2015 1310   AST 38 10/31/2015 1330   AST 27 10/19/2015 1241   AST 32 10/05/2015 1310   AST 35 03/16/2015 1710   ALT 46 10/31/2015 1330   ALT 29 10/19/2015 1241   ALT 38  10/05/2015 1310   ALT 45 03/16/2015 1710   ALKPHOS 96 10/31/2015 1330   ALKPHOS 102 10/19/2015 1241   ALKPHOS 96 10/05/2015 1310   ALKPHOS 81 03/16/2015 1710   BILITOT 0.7 10/31/2015 1330   BILITOT 0.47 10/19/2015 1241   BILITOT 0.47 10/05/2015 1310   BILITOT 0.6 03/16/2015 1710   PROT 7.8 10/31/2015 1330   PROT 7.6 10/19/2015 1241   PROT 8.1 10/05/2015 1310   PROT 7.2 03/16/2015 1710   ALBUMIN 4.1 10/31/2015 1330   ALBUMIN 3.6 10/19/2015 1241   ALBUMIN 3.8 10/05/2015 1310   ALBUMIN 3.7 03/16/2015 1710     Studies/Results: No results found.  Anti-infectives: Anti-infectives    Start     Dose/Rate Route Frequency Ordered Stop   11/03/15 0200  cefoTEtan (CEFOTAN) 2 g in dextrose 5 % 50 mL IVPB     2 g 100 mL/hr over 30 Minutes Intravenous Every 12 hours 11/02/15 1837 11/03/15 0255   11/02/15 1105  cefoTEtan in Dextrose 5% (CEFOTAN) IVPB 2 g     2 g Intravenous On call to O.R. 11/02/15 1105 11/02/15 1355      Assessment Principal Problem:   Colon cancer metastasized to lung [facility]) s/p laparoscopic assisted sigmoid colectomy 11/02/15-continues to do well; final pathology pending.  LOS: 5 days   Plan: Discharge today.  Instructions given to him.   Kache Mcclurg J 11/07/2015

## 2015-11-09 ENCOUNTER — Other Ambulatory Visit: Payer: BLUE CROSS/BLUE SHIELD

## 2015-11-09 ENCOUNTER — Ambulatory Visit (HOSPITAL_BASED_OUTPATIENT_CLINIC_OR_DEPARTMENT_OTHER): Payer: BLUE CROSS/BLUE SHIELD | Admitting: Genetic Counselor

## 2015-11-09 ENCOUNTER — Encounter: Payer: Self-pay | Admitting: General Surgery

## 2015-11-09 DIAGNOSIS — Z85828 Personal history of other malignant neoplasm of skin: Secondary | ICD-10-CM

## 2015-11-09 DIAGNOSIS — C189 Malignant neoplasm of colon, unspecified: Secondary | ICD-10-CM | POA: Diagnosis not present

## 2015-11-09 DIAGNOSIS — C7800 Secondary malignant neoplasm of unspecified lung: Secondary | ICD-10-CM

## 2015-11-09 DIAGNOSIS — Z803 Family history of malignant neoplasm of breast: Secondary | ICD-10-CM | POA: Diagnosis not present

## 2015-11-09 DIAGNOSIS — Z800 Family history of malignant neoplasm of digestive organs: Secondary | ICD-10-CM

## 2015-11-09 DIAGNOSIS — Z86010 Personal history of colonic polyps: Secondary | ICD-10-CM

## 2015-11-09 NOTE — Progress Notes (Unsigned)
Diagnosis 1. Colon, segmental resection for tumor, sigmoid ADENOCARCINOMA OF THE SIGMOID COLON (2.0 CM), GRADE 3 THE TUMOR INVADES MUSCULARIS PROPRIA MARGINS OF RESECTION ARE NEGATIVE METASTATIC ADENOCARCINOMA IN THREE OF THIRTEEN LYMPH NODES (3/13) 2. Colon, resection margin (donut), distal sigmoid BENIGN COLONIC TISSUE  I called him today and discussed the above pathology with him. He is doing well postoperatively. He will be seen in the office tomorrow to have his staples removed.

## 2015-11-10 ENCOUNTER — Encounter: Payer: Self-pay | Admitting: Genetic Counselor

## 2015-11-10 NOTE — Progress Notes (Signed)
Follow Up New Consult   Metastatic Colon Cancer,   Seen 10/02/15  Here for Chest radiation Right upper and lower lobe   Diagnosis 11/02/15: Dr. Sherren Mocha Rosenbower,MD 1. Colon, segmental resection for tumor, sigmoid ADENOCARCINOMA OF THE SIGMOID COLON (2.0 CM), GRADE 3; THE TUMOR INVADES MUSCULARIS PROPRIA1 of 4Duplicate copy FINAL for Bill Wright, Bill Wright [phone]) Diagnosis(continued) MARGINS OF RESECTION ARE NEGATIVEMETASTATIC ADENOCARCINOMA IN THREE OF THIRTEEN LYMPH NODES (3/13) 2. Colon, resection margin (donut), distal sigmoid  BENIGN COLONIC TISSUE Microscopic Comment 1. COLON AND RECTUM (INCLUDING TRANS-ANAL RESECTION   Appt with Dr. Burr Medico 11/15/15 at 845am,   Genetic testing done 11/10/15,   Ct Simulation 11/15/15 @ 1000am   Chemotherapy infusion 11/15/15 at 1130am labs completed  8am  No c/o pain, nausea,coughing, no pain, mild ftaigue from surgery  BP 130/80 mmHg  Pulse 100  Temp(Src) 98 F (36.7 C) (Oral)  Resp 20  Ht 5\' 6"  (1.676 m)  Wt 207 lb 8 oz (94.121 kg)  BMI 33.51 kg/m2  SpO2 100%  Wt Readings from Last 3 Encounters:  11/15/15 207 lb 8 oz (94.121 kg)  11/02/15 212 lb (96.163 kg)  10/31/15 212 lb (96.163 kg)   NKA

## 2015-11-10 NOTE — Progress Notes (Signed)
REFERRING PROVIDER: PERKINS, Rex Kras, PA-C  OTHER PROVIDER(S): Truitt Merle, MD  PRIMARY PROVIDER:  Lujean Amel, MD  PRIMARY REASON FOR VISIT:  1. Colon cancer metastasized to lung [facility]) s/p laparoscopic assisted sigmoid colectomy 11/02/15   2. History of colonic polyps   3. History of basal cell carcinoma   4. Family history of breast cancer in mother   43. Family history of throat cancer      HISTORY OF PRESENT ILLNESS:   Bill Wright, a 55 y.o. male, was seen for a [facility] cancer genetics consultation at the request of Shona Simpson, PA-C due to a personal history of colon cancer at 41.  Bill Wright presents to clinic today with his wife to discuss the possibility of a hereditary predisposition to cancer, genetic testing, and to further clarify his future cancer risks, as well as potential cancer risks for family members.   In September 2016, at the age of 25, Bill Wright was diagnosed with adenocarcinoma of the sigmoid colon.  Bill Wright colon cancer was treated with surgical resection. Microsatellite tumor testing was stable, as per [facility] tumor testing report.  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  Immunohistochemistry of the colon tumor has not been ordered/a report is not available at this time.  One other cecal polyp was found at the time of discovery of colon tumor.  Bill Wright also has a history of a basal cell carcinoma of his chest, diagnosed at the age of 49.     CANCER HISTORY:  Oncology History   Colon cancer metastasized to lung [facility])   Staging form: Colon and Rectum, AJCC 7th Edition     Clinical stage from 03/30/2015: Stage Unknown ([place], N1, M1) - Unsigned       Colon cancer metastasized to lung [facility]) s/p laparoscopic assisted sigmoid colectomy 11/02/15   03/30/2015 Miscellaneous [facility] genomic testing showed TP53 mutation, MSI stable, low tumor mutation burden. Negative for K-ras, NRAS and BRAF    03/30/2015 Initial Biopsy Sigmoid mass biopsy showed invasive adenocarcinoma. Cecal colon polyps showed tubular adenoma.   03/30/2015 Initial Diagnosis Colon cancer   03/30/2015 Procedure colonoscopy by Dr. Michail Sermon showed a fungating, infiltrative and ulcerated nonobstructing large mass in the sigmoid colon and at 20 cm proximal to the anus. The mass was partially circumferential no bleeding. A 10 mm polyps in the cecum was removed.   04/03/2015 Imaging CT chest, abdomen and pelvis with contrast showed nodular masslike area of clinical worsening at rectosigmoid junction, tiny pericolonic lymph nodes, bilateral pulmonary nodules measuring about 1 cm.   04/12/2015 PET scan Hypermetabolic colonic mass near rectosigmoid junction, tiny subcentimeter paracolonic lymph nodes. Bilateral pulmonary metastasis.   04/19/2015 Procedure CT-guided lung nodule biopsy attempted, unsuccessful.   04/27/2015 - 09/14/2015 Chemotherapy Oxaliplatin 130 mg/m on day 1, Capecitabine 2330m q12hr, 2 weeks on and one week off (only received 7 days for first cycle), oxaliplatin held on cycle 5 and dose reduced to 105mm2, capecitabine reduced to 200024m12h D1-14, stopped per pt's request.    06/29/2015 -  Chemotherapy  Panitumumab every 2 weeks, some cycles were postponed due to pt's request    09/18/2015 Imaging Pulmonary nodules are less hypermetabolic, stable size. Stable hypermetabolic portal hepatis and abdominal peritoneal ligament lymph nodes. No other new lesions.      RISK FACTORS:  Colonoscopy: yes; abnormal - this was his first colonoscopy Up to date with prostate exams:  no. Any excessive radiation exposure in the past:  no, but does report a history of secondhand smoke exposure as a child    Past Medical History  Diagnosis Date  . Kidney calculi   . Hypercholesteremia   . Anxiety     situational due to cancer diagnosis  . Cancer [facility]) 2017    colon-chemo 09/22/15 now surgery    Past Surgical History   Procedure Laterality Date  . Colonoscopy  03/30/15  . Laparoscopic partial colectomy N/A 11/02/2015    Procedure: LAPAROSCOPIC ASSISTED SIGMOID COLECTOMY;  Surgeon: Jackolyn Confer, MD;  Location: [facility];  Service: General;  Laterality: N/A;    Social History   Social History  . Marital Status: Married    Spouse Name: N/A  . Number of Children: N/A  . Years of Education: N/A   Social History Main Topics  . Smoking status: Never Smoker   . Smokeless tobacco: Never Used  . Alcohol Use: No  . Drug Use: No  . Sexual Activity: Not Asked   Other Topics Concern  . 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  Significant diagnoses are listed below: Family History  Problem Relation Age of Onset  . Breast cancer Mother 51    +rad and lymph node  . Diabetes Maternal Grandmother     leading to blindness  . Obesity Maternal Aunt   . Stroke Maternal Uncle 81    Bill Wright has two sons, ages 28 and 63, and one daughter who is 67.  He has one maternal half-sister who is currently 64 and who has never had cancer.  His sister has four sons, for whom Bill Wright has limited health information.  Bill Wright mother is currently 16 and has a history of breast cancer diagnosed at 55 and reportedly treated with radiation.  Bill Wright has no information for his biological father or other paternal relatives.  Bill Wright mother has two full brothers and one full sister.  Her sister died in her 29s, and Bill Wright believes this was attributed to obesity.  One brother died of alcohol abuse before the age of 33.  Another brother is currently 86 and just recently had a stroke.  Bill Wright has limited information for his maternal first cousins.  His mother also had 3 or 4 paternal half-brothers for  whom he reports no history of cancer.  His maternal grandmother died of diabetes at 1.  His grandfather died at the age of 19.  He reports that his grandfather had 21 siblings total, and that several of these great aunts/uncles had throat cancers at much later ages.  These great aunts and uncles were also smokers.  He has no further information for other maternal relatives.  Patient's maternal ancestors are of [place] and [place] descent, and paternal ancestors are of [place] descent. There is no reported Ashkenazi Jewish ancestry. There is no known consanguinity.  GENETIC COUNSELING ASSESSMENT: Grant[place] Want is a 55 y.o. male with a personal history of colon cancer and family history of breast cancer. We, therefore, discussed and recommended the following at today's visit.   DISCUSSION: We reviewed the characteristics, features and inheritance patterns of hereditary cancer syndromes. While we have limited information for Bill Wright family, since he has no paternal family history, we discussed that  what we know about his family is not necessarily characteristic of a hereditary cancer syndrome.  We reviewed that features that we expect to see in families with hereditary cancer syndromes include cancers diagnosed at 66 or younger, multiple affected relatives and relatives of multiple generations affected with cancer, individuals with multiple primary cancers, history of large numbers of adenomatous colon polyps, etc.  The information we have for Bill Wright family history does not demonstrate these characteristics.  We also discussed that MSI-stable microsatellite testing of his colon tumor is also reassuring, but that it would be helpful to have an immunohistochemistry test result.  We will make sure that this is ordered by his oncologist, if it has not been ordered yet.  A normal tumor test will be even more reassuring that his colon cancer is most likely a sporadic cancer.  If, on the other hand, his  IHC testing is abnormal, then we will follow-up with testing of the Lynch syndrome genes as appropriate.    Bill Wright has also had [facility] testing of his tumor.  We discussed that these results reviewed a TP53 mutation, "672+1G>A", we discussed that the most likely answer for this pathogenic mutation is that it is confined to the tumor and is not a hereditary/germline mutation.  This is a frequently mutated gene in malignant tumors.  However, Bill Wright agrees with the plan to order genetic analysis of the TP53 gene just to verify that this is the case.  We reviewed Li-Fraumeni syndrome cancer risks and, briefly, recommendations for medical management, should this actually be a germline mutation.  We also discussed genetic testing, including the appropriate family members to test, the process of testing, insurance coverage and turn-around-time for results. We discussed the implications of a negative, positive and/or variant of uncertain significant result. We recommended Bill Wright pursue genetic testing for TP53 gene sequencing and deletion/duplication analysis with Lynch syndrome gene testing only pending abnormal IHC tumor testing results.  We will have the laboratory hold this testing order until we get an IHC report.  Based on Bill Wright personal and family history of cancer, he meets medical criteria for genetic testing. Despite that he meets criteria, he may still have an out of pocket cost. We discussed that if his out of pocket cost for testing is over $100, the laboratory will call and confirm whether he wants to proceed with testing.  If the out of pocket cost of testing is less than $100 he will be billed by the genetic testing laboratory.   PLAN: After considering the risks, benefits, and limitations, Mr. Goodchild  provided informed consent to pursue genetic testing and the blood sample was sent to [facility] for analysis of the TP53 gene sequencing and deletion/duplication  analysis and (potentially) Lynch syndrome gene testing only if Bill Wright has abnormal immunohistochemistry testing of his colon tumor. Results should be available within approximately 2-3 weeks' time, at which point they will be disclosed by telephone to Mr. Captain, as will any additional recommendations warranted by these results. Mr. Thieme will receive a summary of his genetic counseling visit and a copy of his results once available. This information will also be available in Epic. We encouraged Mr. Westfall to remain in contact with cancer genetics annually so that we can continuously update the family history and inform him of any changes in cancer genetics and testing that may be of benefit for his family. Mr. Turnage questions were answered to his satisfaction today. Our contact information was  provided should additional questions or concerns arise.  Thank you for the referral and allowing [place] to share in the care of your patient.   Jeanine Luz, MS, [facility] Certified Genetic Counselor [email or web address] Phone: [phone]  The patient was seen for a total of 50 minutes in face-to-face genetic counseling.  This patient was discussed with Drs. Magrinat, Lindi Adie and/or Burr Medico who agrees with the above.    _______________________________________________________________________ For Office Staff:  Number of people involved in session: 2 Was an Intern/ student involved with case: no

## 2015-11-13 ENCOUNTER — Ambulatory Visit (HOSPITAL_COMMUNITY)
Admission: RE | Admit: 2015-11-13 | Discharge: 2015-11-13 | Disposition: A | Discharge status: Home / Self Care | Payer: BLUE CROSS/BLUE SHIELD | Source: Ambulatory Visit | Attending: Hematology | Admitting: Hematology

## 2015-11-13 ENCOUNTER — Encounter (HOSPITAL_COMMUNITY): Payer: Self-pay

## 2015-11-13 DIAGNOSIS — C189 Malignant neoplasm of colon, unspecified: Secondary | ICD-10-CM | POA: Diagnosis not present

## 2015-11-13 DIAGNOSIS — R590 Localized enlarged lymph nodes: Secondary | ICD-10-CM | POA: Insufficient documentation

## 2015-11-13 DIAGNOSIS — C7800 Secondary malignant neoplasm of unspecified lung: Secondary | ICD-10-CM | POA: Insufficient documentation

## 2015-11-13 DIAGNOSIS — Z9049 Acquired absence of other specified parts of digestive tract: Secondary | ICD-10-CM | POA: Insufficient documentation

## 2015-11-13 MED ORDER — IOPAMIDOL (ISOVUE-300) INJECTION 61%
100.0000 mL | Freq: Once | INTRAVENOUS | Status: AC | PRN
  Administered 2015-11-13: 100 mL via INTRAVENOUS

## 2015-11-14 ENCOUNTER — Encounter (HOSPITAL_COMMUNITY): Payer: Self-pay

## 2015-11-15 ENCOUNTER — Ambulatory Visit
Admission: RE | Admit: 2015-11-15 | Discharge: 2015-11-15 | Disposition: A | Discharge status: Home / Self Care | Payer: BLUE CROSS/BLUE SHIELD | Source: Ambulatory Visit | Attending: Radiation Oncology | Admitting: Radiation Oncology

## 2015-11-15 ENCOUNTER — Encounter: Payer: Self-pay | Admitting: Radiation Oncology

## 2015-11-15 ENCOUNTER — Telehealth: Payer: Self-pay | Admitting: Hematology

## 2015-11-15 ENCOUNTER — Other Ambulatory Visit (HOSPITAL_BASED_OUTPATIENT_CLINIC_OR_DEPARTMENT_OTHER): Payer: BLUE CROSS/BLUE SHIELD

## 2015-11-15 ENCOUNTER — Ambulatory Visit (HOSPITAL_BASED_OUTPATIENT_CLINIC_OR_DEPARTMENT_OTHER): Payer: BLUE CROSS/BLUE SHIELD | Admitting: Hematology

## 2015-11-15 ENCOUNTER — Ambulatory Visit: Payer: BLUE CROSS/BLUE SHIELD | Admitting: Hematology

## 2015-11-15 ENCOUNTER — Ambulatory Visit: Payer: BLUE CROSS/BLUE SHIELD

## 2015-11-15 ENCOUNTER — Telehealth: Payer: Self-pay | Admitting: *Deleted

## 2015-11-15 ENCOUNTER — Encounter: Payer: Self-pay | Admitting: Hematology

## 2015-11-15 ENCOUNTER — Other Ambulatory Visit: Payer: BLUE CROSS/BLUE SHIELD

## 2015-11-15 VITALS — BP 130/80 | HR 100 | Temp 98.0°F | Resp 20 | Ht 66.0 in | Wt 207.5 lb

## 2015-11-15 VITALS — BP 141/83 | HR 83 | Temp 97.8°F | Resp 18 | Wt 208.3 lb

## 2015-11-15 DIAGNOSIS — C7800 Secondary malignant neoplasm of unspecified lung: Secondary | ICD-10-CM

## 2015-11-15 DIAGNOSIS — Z87442 Personal history of urinary calculi: Secondary | ICD-10-CM | POA: Diagnosis not present

## 2015-11-15 DIAGNOSIS — C189 Malignant neoplasm of colon, unspecified: Secondary | ICD-10-CM | POA: Diagnosis present

## 2015-11-15 DIAGNOSIS — C187 Malignant neoplasm of sigmoid colon: Secondary | ICD-10-CM

## 2015-11-15 DIAGNOSIS — C7801 Secondary malignant neoplasm of right lung: Secondary | ICD-10-CM

## 2015-11-15 DIAGNOSIS — E7800 Pure hypercholesterolemia, unspecified: Secondary | ICD-10-CM | POA: Diagnosis not present

## 2015-11-15 DIAGNOSIS — Z510 Encounter for antineoplastic radiation therapy: Secondary | ICD-10-CM | POA: Diagnosis not present

## 2015-11-15 LAB — CBC WITH DIFFERENTIAL/PLATELET
BASO%: 0.9 % (ref 0.0–2.0)
Basophils Absolute: 0.1 10*3/uL (ref 0.0–0.1)
EOS ABS: 0.1 10*3/uL (ref 0.0–0.5)
EOS%: 2.3 % (ref 0.0–7.0)
HEMATOCRIT: 38.9 % (ref 38.4–49.9)
HGB: 13 g/dL (ref 13.0–17.1)
LYMPH%: 20.5 % (ref 14.0–49.0)
MCH: 29.3 pg (ref 27.2–33.4)
MCHC: 33.5 g/dL (ref 32.0–36.0)
MCV: 87.5 fL (ref 79.3–98.0)
MONO#: 0.6 10*3/uL (ref 0.1–0.9)
MONO%: 10.6 % (ref 0.0–14.0)
NEUT%: 65.7 % (ref 39.0–75.0)
NEUTROS ABS: 3.8 10*3/uL (ref 1.5–6.5)
PLATELETS: 299 10*3/uL (ref 140–400)
RBC: 4.45 10*6/uL (ref 4.20–5.82)
RDW: 13.6 % (ref 11.0–14.6)
WBC: 5.8 10*3/uL (ref 4.0–10.3)
lymph#: 1.2 10*3/uL (ref 0.9–3.3)

## 2015-11-15 LAB — COMPREHENSIVE METABOLIC PANEL
ALBUMIN: 3.7 g/dL (ref 3.5–5.0)
ALK PHOS: 75 U/L (ref 40–150)
ALT: 47 U/L (ref 0–55)
ANION GAP: 10 meq/L (ref 3–11)
AST: 31 U/L (ref 5–34)
BILIRUBIN TOTAL: 0.46 mg/dL (ref 0.20–1.20)
BUN: 8.3 mg/dL (ref 7.0–26.0)
CALCIUM: 9.2 mg/dL (ref 8.4–10.4)
CHLORIDE: 107 meq/L (ref 98–109)
CO2: 23 mEq/L (ref 22–29)
CREATININE: 0.8 mg/dL (ref 0.7–1.3)
EGFR: >90 mL/min/{1.73_m2} (ref >90)
Glucose: 110 mg/dl (ref 70–140)
Potassium: 3.6 mEq/L (ref 3.5–5.1)
Sodium: 140 mEq/L (ref 136–145)
Total Protein: 7.3 g/dL (ref 6.4–8.3)

## 2015-11-15 LAB — MAGNESIUM: Magnesium: 2 mg/dl (ref 1.5–2.5)

## 2015-11-15 NOTE — Progress Notes (Signed)
Radiation Oncology         [phone]) [phone] ________________________________  Name: Bill Wright MRN: [id number]  Date: 11/15/2015  DOB: [birth year]  Follow-Up Visit Note  CC: Lujean Amel, MD  Jackolyn Confer, MD  Diagnosis:   Stage IV A, pT2, N1b, M1a adenocarcinoma of the sigmoid colon with presumed metastatic disease to the lung.     ICD-9-CM ICD-10-CM   1. Colon cancer metastasized to lung (HCC) 153.9 C18.9    197.0 C78.00     Narrative: [name] is a pleasant 55 y.o. male with History of adenocarcinoma of the colon. He was originally evaluated after rectal bleeding, and a biopsy of a sigmoid mass seen during colonoscopy in September 2016 revealed adenocarcinoma. During his staging workup in September and October 2016 a PET scan revealed hypermetabolic changes of a colonic mass near the rectosigmoid junction consistent with his diagnosis, tiny subcentimeter pericolonic lymph nodes, and bilateral pulmonary metastases measuring 10 mm on the right with an SUV of 5.8. A CT-guided biopsy was attempted but could not be completed. He went on to receive chemotherapy with Oxaliplatin and Capecitabine between 04/27/2015-09/14/2015. He's also received Panitumumab starting December 2016 but discontinued this due to a syncopal like episode. Repeat imagining on 09/18/2015 revealed an increase in size of his pulmonary nodules with a right upper lobe measuring 1.3 cm previously 9 mm. A right lower lobe lesion measuring 1.2 cm versus 9 mm, peritoneal small bowel mesenteric adenopathy and an area thickening in the region of the sigmoid colon, low affinity for PET.  The patient was seen by Dr. Lisbeth Renshaw in March 2013, but was also complaining undergo debulking, which she has now undergone on 11/02/2015. ML H&P testing does not reveal any abnormality, and his tumor was noted to be 2 cm within the sigmoid colon, grade 3, invading the muscularis propria. Resection margins were negative, of the 13th, 3 did contain  metastatic disease. The distal sigmoid resection margin was also performed and was negative for disease. He comes today for further discussion of S the RT to his pulmonary lesions. He did undergo a repeat CT scan of the chest abdomen pelvis on Monday of this week which reveals 4 lesions of the chest, a 9 mm left upper lobe lesion, a 10 mm nodule in the posterior right upper lobe, a 12 x 14 mm nodule in the posterior right upper lobe, and a 15 mm partially cavitary nodule in the right lower lobe. Each of these have changed by several millimeters since his last scan in March. He also has several small upper abdominal lymph nodes measuring 10 mm in short axis in the porta hepatis region, but no other areas concerning for adenopathy. He plans to see Dr. Burr Medico today for further discussion of his long-term plan for care after he is seen today by Dr. Lisbeth Renshaw  Prior Radiation: No   ALLERGIES:  has No Known Allergies.  Meds: Current Outpatient Prescriptions  Medication Sig Dispense Refill  . clindamycin (CLINDAGEL) 1 % gel Apply topically 2 (two) times daily. 30 g 5  . HYDROcodone-acetaminophen (NORCO/VICODIN) 5-325 MG tablet Take 1-2 tablets by mouth every 4 (four) hours as needed for moderate pain. 40 tablet 0  . hydrocortisone 2.5 % lotion Apply topically 2 (two) times daily. 59 mL 5   No current facility-administered medications for this encounter.    Physical Findings:  height is 5\' 6"  (1.676 m) and weight is 207 lb 8 oz (94.121 kg). His oral temperature is 98 F (36.7 C). His  blood pressure is 130/80 and his pulse is 100. His respiration is 20 and oxygen saturation is 100%.   Pain scale 0/10  In general this is a well appearing Caucasian male in no acute distress. He is alert and oriented x4 and appropriate throughout the examination. HEENT reveals that the patient is normocephalic, atraumatic. EOMs are intact. PERRLA. Skin is intact without any evidence of gross lesions. Cardiovascular exam reveals a  regular rate and rhythm, no clicks rubs or murmurs are auscultated. Chest is clear to auscultation bilaterally. Lymphatic assessment is performed and does not reveal any adenopathy in the cervical, supraclavicular, axillary, or inguinal chains. Abdomen has active bowel sounds in all quadrants and is intact. He has a low midline laparotomy incision is healing well with minimal skin separation. No cellulitic streaking or bleeding is noted. His lateral portal sites are also intact without evidence of separation or cellulitic change. The abdomen is soft, non tender, non distended. Lower extremities are negative for pretibial pitting edema, deep calf tenderness, cyanosis or clubbing.    Lab Findings: Lab Results  Component Value Date   WBC 5.8 11/15/2015   WBC 13.3* 11/03/2015   HGB 13.0 11/15/2015   HGB 12.9* 11/03/2015   HCT 38.9 11/15/2015   HCT 38.3* 11/03/2015   PLT 299 11/15/2015   PLT 236 11/03/2015    Lab Results  Component Value Date   NA 138 11/03/2015   NA 139 10/19/2015   K 4.4 11/03/2015   K 3.6 10/19/2015   CHLORIDE 106 10/19/2015   CO2 25 11/03/2015   CO2 22 10/19/2015   GLUCOSE 158* 11/03/2015   GLUCOSE 148* 10/19/2015   BUN 9 11/03/2015   BUN 8.4 10/19/2015   CREATININE 0.84 11/03/2015   CREATININE 0.8 10/19/2015   BILITOT 0.7 10/31/2015   BILITOT 0.47 10/19/2015   ALKPHOS 96 10/31/2015   ALKPHOS 102 10/19/2015   AST 38 10/31/2015   AST 27 10/19/2015   ALT 46 10/31/2015   ALT 29 10/19/2015   PROT 7.8 10/31/2015   PROT 7.6 10/19/2015   ALBUMIN 4.1 10/31/2015   ALBUMIN 3.6 10/19/2015   CALCIUM 8.7* 11/03/2015   CALCIUM 8.9 10/19/2015   ANIONGAP 10 11/03/2015   ANIONGAP 11 10/19/2015    Radiographic Findings: Ct Chest W Contrast  11/13/2015  CLINICAL DATA:  Colon cancer with lung metastases, diagnosed 04/2015, chemotherapy complete, status post colon resection last week. EXAM: CT CHEST, ABDOMEN, AND PELVIS WITH CONTRAST TECHNIQUE: Multidetector CT imaging  of the chest, abdomen and pelvis was performed following the standard protocol during bolus administration of intravenous contrast. CONTRAST:  171mL ISOVUE-300 IOPAMIDOL (ISOVUE-300) INJECTION 61% COMPARISON:  PET-CT dated 09/18/2015 FINDINGS: CT CHEST FINDINGS Mediastinum/Nodes: The heart is top-normal in size. No pericardial effusion. No evidence of thoracic aortic aneurysm. No suspicious mediastinal lymphadenopathy. Visualized thyroid is unremarkable. Lungs/Pleura: Multiple bilateral pulmonary nodules, including: --9 mm nodule in the anterior left upper lobe (series 4/ image 22), previously 6 mm --10 mm nodule in the posterior right upper lobe (series 4/ image 53), previously 8 mm --12 x 14 mm nodule in the posterior right upper lobe (series 4/ image 56), previously 9 x 12 mm --15 mm partially cavitary nodule in the right lower lobe (series 4/ image 79), previously 12 mm No focal consolidation. No pleural effusion or pneumothorax. Musculoskeletal: Mild degenerative changes of the visualized thoracic spine. CT ABDOMEN PELVIS FINDINGS Hepatobiliary: Liver is notable for suspected hepatic steatosis with focal fatty sparing along the gallbladder fossa. Gallbladder is unremarkable. No  intrahepatic or extrahepatic ductal dilatation. Pancreas: Within normal limits. Spleen: Within normal limits. Adrenals/Urinary Tract: Adrenal glands are within normal limits. 9 mm cyst in the medial left upper kidney (series 5/image 9). Right kidney is within normal limits. No hydronephrosis. Bladder is within normal limits. Stomach/Bowel: No evidence of bowel obstruction. Normal appendix (series 2/ image 85). Status post partial left hemicolectomy with suture line in the left lower quadrant (series 2/ image 100). Mild residual wall thickening near the surgical anastomosis (series 2/image 99). Vascular/Lymphatic: No evidence of abdominal aortic aneurysm. Retro aortic left renal vein. No suspicious abdominopelvic lymphadenopathy. Very  mild nonspecific jejunal mesentery stranding, without discrete lymphadenopathy, improved. Small upper abdominal lymph nodes, including a 10 mm short axis node in the porta hepatis (series 2/image 53). No suspicious retroperitoneal lymphadenopathy. Reproductive: Prostate is unremarkable. Other: No abdominopelvic ascites. Postsurgical changes in the midline lower anterior abdominal wall. Musculoskeletal: Mild degenerative changes of the lumbar spine. Suspected vertebral hemangioma at L3. IMPRESSION: Postsurgical changes related to partial left hemicolectomy. Small upper abdominal lymph nodes measuring up to 10 mm short axis, nonspecific. Mild nonspecific jejunal mesentery stranding without discrete lymphadenopathy, improved. Mild progression of pulmonary metastases, measuring up to 15 mm in the right lower lobe. Electronically Signed   By: Julian Hy M.D.   On: 11/13/2015 14:21   Ct Abdomen Pelvis W Contrast  11/13/2015  CLINICAL DATA:  Colon cancer with lung metastases, diagnosed 04/2015, chemotherapy complete, status post colon resection last week. EXAM: CT CHEST, ABDOMEN, AND PELVIS WITH CONTRAST TECHNIQUE: Multidetector CT imaging of the chest, abdomen and pelvis was performed following the standard protocol during bolus administration of intravenous contrast. CONTRAST:  18mL ISOVUE-300 IOPAMIDOL (ISOVUE-300) INJECTION 61% COMPARISON:  PET-CT dated 09/18/2015 FINDINGS: CT CHEST FINDINGS Mediastinum/Nodes: The heart is top-normal in size. No pericardial effusion. No evidence of thoracic aortic aneurysm. No suspicious mediastinal lymphadenopathy. Visualized thyroid is unremarkable. Lungs/Pleura: Multiple bilateral pulmonary nodules, including: --9 mm nodule in the anterior left upper lobe (series 4/ image 22), previously 6 mm --10 mm nodule in the posterior right upper lobe (series 4/ image 53), previously 8 mm --12 x 14 mm nodule in the posterior right upper lobe (series 4/ image 56), previously 9 x 12 mm  --15 mm partially cavitary nodule in the right lower lobe (series 4/ image 79), previously 12 mm No focal consolidation. No pleural effusion or pneumothorax. Musculoskeletal: Mild degenerative changes of the visualized thoracic spine. CT ABDOMEN PELVIS FINDINGS Hepatobiliary: Liver is notable for suspected hepatic steatosis with focal fatty sparing along the gallbladder fossa. Gallbladder is unremarkable. No intrahepatic or extrahepatic ductal dilatation. Pancreas: Within normal limits. Spleen: Within normal limits. Adrenals/Urinary Tract: Adrenal glands are within normal limits. 9 mm cyst in the medial left upper kidney (series 5/image 9). Right kidney is within normal limits. No hydronephrosis. Bladder is within normal limits. Stomach/Bowel: No evidence of bowel obstruction. Normal appendix (series 2/ image 85). Status post partial left hemicolectomy with suture line in the left lower quadrant (series 2/ image 100). Mild residual wall thickening near the surgical anastomosis (series 2/image 99). Vascular/Lymphatic: No evidence of abdominal aortic aneurysm. Retro aortic left renal vein. No suspicious abdominopelvic lymphadenopathy. Very mild nonspecific jejunal mesentery stranding, without discrete lymphadenopathy, improved. Small upper abdominal lymph nodes, including a 10 mm short axis node in the porta hepatis (series 2/image 53). No suspicious retroperitoneal lymphadenopathy. Reproductive: Prostate is unremarkable. Other: No abdominopelvic ascites. Postsurgical changes in the midline lower anterior abdominal wall. Musculoskeletal: Mild degenerative changes of the lumbar  spine. Suspected vertebral hemangioma at L3. IMPRESSION: Postsurgical changes related to partial left hemicolectomy. Small upper abdominal lymph nodes measuring up to 10 mm short axis, nonspecific. Mild nonspecific jejunal mesentery stranding without discrete lymphadenopathy, improved. Mild progression of pulmonary metastases, measuring up to 15  mm in the right lower lobe. Electronically Signed   By: Julian Hy M.D.   On: 11/13/2015 14:21   Impression: Bill Wright is a 55 y.o male with metastatic colon cancer to lung.  Plan:  We discussed the findings from the patient's recent trauma and again reviews the findings from his most recent CT scan on 11/13/2015. He reviews the lesion previously seen have increased slightly in size, and there is no up for this lesion that also would be amenable for treatment. We discussed 5 fractions of treatment to the lesion on the left, and 3 fractions to I the lesions on the right. Dr. Lisbeth Renshaw discusses  discussed the potential risks, benefits, and side effects of this radiation treatment with the patient. He has signed informed consent and is prepared to proceed with treatment. CT simulation is scheduled for later today, 11/15/2015, and we will perform light compression of the abdomen to minimize motion during treatment.  We will anticipate beginning his treatment the week of 11/27/2015. He will follow-up today as well as Dr. Burr Medico for further discussion of his care.  The above documentation reflects my direct findings during this shared patient visit. Please see the separate note by Dr. Lisbeth Renshaw on this date for the remainder of the patient's plan of care.    Carola Rhine, PAC   This document serves as a record of services personally performed by Shona Simpson, PA and Kyung Rudd, MD. It was created on their behalf by Jenell Milliner, a trained medical scribe. The creation of this record is based on the scribe's personal observations and the provider's statements to them. This document has been checked and approved by the attending provider.

## 2015-11-15 NOTE — Progress Notes (Signed)
Please see the Nurse Progress Note in the MD Initial Consult Encounter for this patient. 

## 2015-11-15 NOTE — Telephone Encounter (Signed)
Pt to be here at 1`130am, ok per Marcello Moores at [place], and Rn katy MC 49m 9:04 AM

## 2015-11-15 NOTE — Progress Notes (Signed)
Bill Wright  Telephone:([phone]) [phone] Fax:([phone]) [phone]  Clinic Follow Up Note   Patient Care Team: Dibas Koirala, MD as PCP - General (Family Medicine) Wilford Corner, MD as Consulting Physician (Gastroenterology) Jackolyn Confer, MD as Consulting Physician (General Surgery) Truitt Merle, MD as Consulting Physician (Hematology) 11/15/2015  CHIEF COMPLAINTS:  Follow Up colon cancer  Oncology History   T2, Colon cancer metastasized to lung [facility])   Staging form: Colon and Rectum, AJCC 7th Edition     Clinical stage from 03/30/2015: Stage Unknown ([place], N1, M1) - Unsigned       Colon cancer metastasized to lung [facility]) s/p laparoscopic assisted sigmoid colectomy 11/02/15   03/30/2015 Miscellaneous Foundation one genomic testing showed TP53 mutation, MSI stable, low tumor mutation burden. Negative for K-ras, NRAS and BRAF   03/30/2015 Initial Biopsy Sigmoid mass biopsy showed invasive adenocarcinoma. Cecal colon polyps showed tubular adenoma.   03/30/2015 Initial Diagnosis Colon cancer   03/30/2015 Procedure colonoscopy by Dr. Michail Sermon showed a fungating, infiltrative and ulcerated nonobstructing large mass in the sigmoid colon and at 20 cm proximal to the anus. The mass was partially circumferential no bleeding. A 10 mm polyps in the cecum was removed.   04/03/2015 Imaging CT chest, abdomen and pelvis with contrast showed nodular masslike area of clinical worsening at rectosigmoid junction, tiny pericolonic lymph nodes, bilateral pulmonary nodules measuring about 1 cm.   04/12/2015 PET scan Hypermetabolic colonic mass near rectosigmoid junction, tiny subcentimeter paracolonic lymph nodes. Bilateral pulmonary metastasis.   04/19/2015 Procedure CT-guided lung nodule biopsy attempted, unsuccessful.   04/27/2015 - 09/14/2015 Chemotherapy Oxaliplatin 130 mg/m on day 1, Capecitabine 2375m q12hr, 2 weeks on and one week off (only received 7 days for first cycle), oxaliplatin held on cycle 5  and dose reduced to 1030mm2, capecitabine reduced to 200088m12h D1-14, stopped per pt's request.    06/29/2015 -  Chemotherapy  Panitumumab every 2 weeks, some cycles were postponed due to pt's request    09/18/2015 Imaging Pulmonary nodules are less hypermetabolic, stable size. Stable hypermetabolic portal hepatis and abdominal peritoneal ligament lymph nodes. No other new lesions.    11/02/2015 Surgery sigmoid colon segmental resection    11/02/2015 Pathology Results Sigmoid colon segmental resection showed adenocarcinoma, grade 3,  T2, 3 out of 13 lymph nodes were positive, surgical margins were negative. LVI(-), perineural invasion negative   11/13/2015 Imaging CT chest, abdomen and pelvis with contrast showed postsurgical changes, mild progression of pulmonary metastasis, measuring up to 15 mm in the right lower lobe.    HISTORY OF PRESENTING ILLNESS:  Bill Wright 55o. male is here because of recently newly diagnosed colon cancer.  He has had intermittent bloody stool for 2 years, it has been mild, mixed with stool, patient does not have any abdominal pain, constipation, change of his bowel habits, nausea, weight loss or other symptoms. He did not seek medical attention for this. He went to emergency room on 03/15/2015 for right flank pain, due to his kidney stone. CT scan incidentally found a 11 mm right lower lobe nodule and mucosal edema in the sigmoid colon. He saw his primary care physician, and was referred to GI Dr. SchMichail Sermonre at he underwent colonoscopy on 03/30/2015, which showed a fungating infiltrative and ulcerated nonobstructing large mass in the sigmoid colon, biopsy showed adenocarcinoma. CT chest abdomen and pelvis showed multiple lung nodules measuring about 1 cm. He was referred to surgeon Dr. RosZella Richerho referred patient to us [place] further workup of his  lung nodule and discuss chemotherapy.  He feels very well overall, denies any symptoms. He is a Freight forwarder at [employer], lives with his wife and 3 children. He never had screening colonoscopy prior the reason one, no significant past medical history, does not see doctors regularly.  CURRENT TREATMENT: pending SBRT to lung mets   INTERIM HISTORY Bill Wright returns for follow-up. He underwent a sigmoid colon segmental resection on 11/02/2015. He tolerated surgery well, and has recovered well. He denies any pain at the incision site, no abdominal discomfort or other complaints. He was seen by radiation oncologist Dr. Lisbeth Renshaw this morning, and we'll start S/P RT to his lung metastasis in a few weeks.  MEDICAL HISTORY:  Past Medical History  Diagnosis Date  . Kidney calculi   . Hypercholesteremia   . Anxiety     situational due to cancer diagnosis  . Cancer ([place]) 2017    colon-chemo 09/22/15 now surgery    SURGICAL HISTORY: Past Surgical History  Procedure Laterality Date  . Colonoscopy  03/30/15  . Laparoscopic partial colectomy N/A 11/02/2015    Procedure: LAPAROSCOPIC ASSISTED SIGMOID COLECTOMY;  Surgeon: Jackolyn Confer, MD;  Location: [facility];  Service: General;  Laterality: N/A;    SOCIAL HISTORY: Social History   Social History  . Marital Status: Married    Spouse Name: N/A  . Number of Children: 3, age of 71, 39 and 74   . Years of Education: N/A   Occupational History  . Banker for [employer] [place]    Social History Main Topics  . Smoking status: Never Smoker   . Smokeless tobacco: Not on file  . Alcohol Use: No  . Drug Use: No  . Sexual Activity: Not on file   Other Topics Concern  . Not on file   Social History Narrative    FAMILY HISTORY: Family History  Problem Relation Age of Onset  . Breast cancer Mother 51    +rad and lymph node  . Diabetes Maternal Grandmother     leading to blindness  . Obesity Maternal Aunt   . Stroke Maternal Uncle 65    ALLERGIES:  has No Known Allergies.  MEDICATIONS:  Current Outpatient Prescriptions  Medication Sig Dispense Refill  .  clindamycin (CLINDAGEL) 1 % gel Apply topically 2 (two) times daily. 30 g 5  . HYDROcodone-acetaminophen (NORCO/VICODIN) 5-325 MG tablet Take 1-2 tablets by mouth every 4 (four) hours as needed for moderate pain. 40 tablet 0  . hydrocortisone 2.5 % lotion Apply topically 2 (two) times daily. 59 mL 5   No current facility-administered medications for this visit.    REVIEW OF SYSTEMS:   Constitutional: Denies fevers, chills or abnormal night sweats, no weight loss. Eyes: Denies blurriness of vision, double vision or watery eyes Ears, nose, mouth, throat, and face: Denies mucositis or sore throat Respiratory: Denies cough, dyspnea or wheezes Cardiovascular: Denies palpitation, chest discomfort or lower extremity swelling Gastrointestinal:  Denies nausea, heartburn or change in bowel habits Skin: Denies abnormal skin rashes Lymphatics: Denies new lymphadenopathy or easy bruising Neurological:Denies numbness, tingling or new weaknesses Behavioral/Psych: Mood is stable, no new changes  All other systems were reviewed with the patient and are negative.  PHYSICAL EXAMINATION: ECOG PERFORMANCE STATUS: 0 - Asymptomatic BP 141/83 mmHg  Pulse 83  Temp(Src) 97.8 F (36.6 C) (Oral)  Resp 18  Wt 208 lb 4.8 oz (94.484 kg)  SpO2 100% GENERAL:alert, no distress and comfortable SKIN: skin color, texture, turgor are normal, diffuse  acne-like rash on his front chest and back, scattered rash on his neck and face, there is a healing large boil above his upper lip, no discharge  EYES: normal, conjunctiva are pink and non-injected, sclera clear OROPHARYNX:no exudate, no erythema and lips, buccal mucosa, and tongue normal  NECK: supple, thyroid normal size, non-tender, without nodularity LYMPH:  no palpable lymphadenopathy in the cervical, axillary or inguinal LUNGS: clear to auscultation and percussion with normal breathing effort HEART: regular rate & rhythm and no murmurs and no lower extremity  edema ABDOMEN:abdomen soft, non-tender and normal bowel sounds. The midline incision has healed well, no discharge or skin erythema  Musculoskeletal:no cyanosis of digits and no clubbing  PSYCH: alert & oriented x 3 with fluent speech NEURO: no focal motor/sensory deficits  LABORATORY DATA:  I have reviewed the data as listed CBC Latest Ref Rng 11/15/2015 11/03/2015 10/31/2015  WBC 4.0 - 10.3 10e3/uL 5.8 13.3(H) 6.6  Hemoglobin 13.0 - 17.1 g/dL 13.0 12.9(L) 14.7  Hematocrit 38.4 - 49.9 % 38.9 38.3(L) 41.6  Platelets 140 - 400 10e3/uL 299 236 223   CMP Latest Ref Rng 11/15/2015 11/03/2015 10/31/2015  Glucose 70 - 140 mg/dl 110 158(H) 106(H)  BUN 7.0 - 26.0 mg/dL 8._0 Creatinine 0.7 - 1.3 mg/dL 0.8 0.84 0.71  Sodium 136 - 145 mEq/L 140 138 140  Potassium 3.5 - 5.1 mEq/L 3.6 4.4 4.2  Chloride 101 - 111 mmol/L - 103 103  CO2 22 - 29 mEq/L _1 Calcium 8.4 - 10.4 mg/dL 9.2 8.7(L) 9.3  Total Protein 6.4 - 8.3 g/dL 7.3 - 7.8  Total Bilirubin 0.20 - 1.20 mg/dL 0.46 - 0.7  Alkaline Phos 40 - 150 U/L 75 - 96  AST 5 - 34 U/L 31 - 38  ALT 0 - 55 U/L 47 - 46    PATHOLOGY REPORT  Diagnosis 11/02/2015 1. Colon, segmental resection for tumor, sigmoid ADENOCARCINOMA OF THE SIGMOID COLON (2.0 CM), GRADE 3 THE TUMOR INVADES MUSCULARIS PROPRIA MARGINS OF RESECTION ARE NEGATIVE METASTATIC ADENOCARCINOMA IN THREE OF THIRTEEN LYMPH NODES (3/13) 2. Colon, resection margin (donut), distal sigmoid BENIGN COLONIC TISSUE Microscopic Comment 1. COLON AND RECTUM (INCLUDING TRANS-ANAL RESECTION): Specimen: Sigmoid Procedure: Segmental resection Tumor site: sigmoid Specimen integrity: Intact Macroscopic intactness of mesorectum: Not applicable: x Complete: NA Near complete: NA Incomplete: NA Cannot be determined (specify): NA Macroscopic tumor perforation: Muscularis Invasive tumor: Maximum size: 2.0 cm Histologic type(s): Adenocarcinoma Histologic grade and differentiation: G3 G1: well  differentiated/low grade G2: moderately differentiated/low grade G3: poorly differentiated/high grade G4: undifferentiated/high grade Type of polyp in which invasive carcinoma arose: Tubular adenoma Microscopic extension of invasive tumor: Muscularis propria Lymph-Vascular invasion: Negative Peri-neural invasion: Negative Tumor deposit(s) (discontinuous extramural extension): Negative Resection margins: Proximal margin: Negative Distal margin: Negative Circumferential (radial) (posterior ascending, posterior descending; lateral and posterior mid-rectum; and entire lower 1/3 rectum):Negative Mesenteric margin (sigmoid and transverse): Negative Distance closest margin (if all above margins negative): 3.5 cm Trans-anal resection margins only: Deep margin: NA Mucosal Margin: NA Distance closest mucosal margin (if negative): NA Treatment effect (neo-adjuvant therapy): Partial Additional polyp(s): None Non-neoplastic findings: unremarkable Lymph nodes: number examined 13; number positive: 3 Pathologic Staging: T2, N1b, M1a     RADIOGRAPHIC STUDIES: I have personally reviewed the radiological images as listed and agreed with the findings in the report.  PET 04/12/2015 IMPRESSION: Hypermetabolic colonic mass near rectosigmoid junction, consistent with known primary colon carcinoma.  Tiny sub-cm pericolonic lymph nodes in sigmoid mesocolon are  too small to characterize by PET, but are suspicious for early lymph node metastases.  Bilateral pulmonary metastases  PET 09/18/2015 IMPRESSION: 1. Hypermetabolic pulmonary metastases have enlarged slightly from 07/18/2015. 2. Hypermetabolic porta hepatis/abdominal peritoneal ligament lymph nodes, stable from 04/12/2015. 3. Increase in hypermetabolism associated with mesenteric haziness and nodularity. While a reactive phenomenon/panniculitis can create this in appearance, metastatic disease/lymphoproliferative disorder cannot be  excluded. 4. Hepatic steatosis. 5. Bilateral nephrolithiasis.  CT chest, abdomen and pelvis with contrast 11/13/2015 IMPRESSION: Postsurgical changes related to partial left hemicolectomy.  Small upper abdominal lymph nodes measuring up to 10 mm short axis, nonspecific. Mild nonspecific jejunal mesentery stranding without discrete lymphadenopathy, improved.  Mild progression of pulmonary metastases, measuring up to 15 mm in the right lower lobe.   ASSESSMENT & PLAN:  55 year old male, without significant past medical history except kidney stone, presented with intermittent bloody stool for 2 years, and colonoscopy showed a large sigmoid colon mass, CT scan showed multiple (at least 4) nodules in bilateral lungs, measuring about 1 cm.  1. Sigmoid colon adenocarcinoma, TxN1M1, probably stage IV with lung mets  -I previously reviewed his colonoscopy, CT scan findings and the biopsy results in great details with patient and his wife. -I reviewed his surgical pathology findings, which showed a residual T2 primary tumor, 3 lymph nodes positive, grade 3 disease, certainly high risk disease.  -I personally reviewed his CT scan image from 11/13/2015 with him, he has at least 4 lung nodules in bilateral lungs, measuring up to 1.5cm, which got smaller after chemo, and now progressed again, these lesions are most consistent with metastatic disease from his colon cancer.  -We discussed that his disease is likely incurable, and the goal of therapy is maximum disease control and prolong his life.  -- His tumor does not contain KRAS/NRAS or BRAF mutation, so he would benefit from EGFR antibody, Panitumumab was added on from cycle 4, tolerating well  -he is going to have SBRT to his 4 lung lesions, which I think is a reasonable option for local therapy. But I am very concerned about microscopic metastatic disease, which will likely recur if he stops systemic therapy. -He received a total of 4 month of  chemotherapy which was held per patient's request. -I strongly encouraged him to consider maintenance Xeloda and panitumumab therapy after his radiation, but he declined. He understands the risk that his cancer is likely recur in the future. -we will start surveillance after his radiation. I plan to repeat a CT chest abdomen and pelvis in 2-3 months after his last dose of radiation. -Today's lab result reviewed with patient, which are normal  2. Skin rashes -Secondary to panitumumab -resolved now   Plan -will hold systemic therapy for now, per pt's request  -he will receive SBRT 5 treatment to his lung mets from 5/24 to 6/5 -I will see him back in mid June, will order restaging CT on next visit   I spent 20 minutes counseling the patient face to face. The total time spent in the appointment was 25 minutes and more than 50% was on counseling.     Truitt Merle, MD 11/15/2015

## 2015-11-15 NOTE — Telephone Encounter (Signed)
Pt will p/u sched in tx room °

## 2015-11-16 LAB — CEA: CEA1: 0.9 ng/mL (ref 0.0–4.7)

## 2015-11-16 NOTE — Discharge Summary (Signed)
Physician Discharge Summary  Patient ID: Bill Wright MRN: [id number] DOB/AGE: May 12, 1961 55 y.o.  Admit date: 11/02/2015 Discharge date: 11/07/2015  Admission Diagnoses:  Stage IV colon cancer  Discharge Diagnoses:  Principal Problem:   Colon cancer metastasized to lung [facility]) s/p laparoscopic assisted sigmoid colectomy 11/02/15   Discharged Condition: good  Hospital Course: He underwent the above procedure and tolerated it well.  He was placed on the post-colectomy pathway and had a good progression.  By his 5th postop day his bowels were moving, he was tolerating a solid diet, and he was ambulating independently.  He was discharged on 11/07/15.  Discharge instructions were given to him.   Discharge Exam: Blood pressure 120/91, pulse 93, temperature 98.2 F (36.8 C), temperature source Oral, resp. rate 20, height 5\' 6"  (1.676 m), weight 96.163 kg (212 lb), SpO2 97 %.   Disposition: 01-Home or Self Care     Medication List    STOP taking these medications        minocycline 100 MG capsule  Commonly known as:  MINOCIN     ondansetron 8 MG tablet  Commonly known as:  ZOFRAN      TAKE these medications        clindamycin 1 % gel  Commonly known as:  CLINDAGEL  Apply topically 2 (two) times daily.     HYDROcodone-acetaminophen 5-325 MG tablet  Commonly known as:  NORCO/VICODIN  Take 1-2 tablets by mouth every 4 (four) hours as needed for moderate pain.     hydrocortisone 2.5 % lotion  Apply topically 2 (two) times daily.         Signed: Odis Hollingshead 11/16/2015, 9:21 AM

## 2015-11-20 ENCOUNTER — Encounter: Payer: Self-pay | Admitting: Hematology

## 2015-11-20 ENCOUNTER — Telehealth: Payer: Self-pay | Admitting: Genetic Counselor

## 2015-11-20 NOTE — Progress Notes (Signed)
[employer] form left in pod

## 2015-11-20 NOTE — Telephone Encounter (Signed)
Called Bill Wright to let him know that his immunohistochemistry tumor testing was normal, so there is no need to include additional genes on testing.  Will proceed with TP53 gene sequencing and deletion/duplication analysis as planned.  He understands and approves.  I called to lab to follow-up and let them know that we would proceed with TP53 testing only.

## 2015-11-21 ENCOUNTER — Encounter: Payer: Self-pay | Admitting: Hematology

## 2015-11-21 NOTE — Progress Notes (Signed)
left in pod- left for dr. Burr Medico to sign

## 2015-11-22 ENCOUNTER — Encounter: Payer: Self-pay | Admitting: Hematology

## 2015-11-22 NOTE — Progress Notes (Signed)
left in pod- left for dr. Burr Medico to sign-faxed [employer]  [phone]-sent to medical recrd and copy for pat at front with ms wilma

## 2015-11-23 ENCOUNTER — Ambulatory Visit
Admission: RE | Admit: 2015-11-23 | Discharge: 2015-11-23 | Disposition: A | Discharge status: Home / Self Care | Payer: BLUE CROSS/BLUE SHIELD | Source: Ambulatory Visit | Attending: Radiation Oncology | Admitting: Radiation Oncology

## 2015-11-24 ENCOUNTER — Other Ambulatory Visit: Payer: Self-pay | Admitting: Hematology

## 2015-11-24 DIAGNOSIS — C7800 Secondary malignant neoplasm of unspecified lung: Principal | ICD-10-CM

## 2015-11-24 DIAGNOSIS — C189 Malignant neoplasm of colon, unspecified: Secondary | ICD-10-CM

## 2015-11-24 NOTE — Addendum Note (Signed)
Encounter addended by: Kyung Rudd, MD on: 11/24/2015  9:38 PM<BR>     Documentation filed: Follow-up Section

## 2015-11-29 ENCOUNTER — Ambulatory Visit
Admission: RE | Admit: 2015-11-29 | Discharge: 2015-11-29 | Disposition: A | Discharge status: Home / Self Care | Payer: BLUE CROSS/BLUE SHIELD | Source: Ambulatory Visit | Attending: Radiation Oncology | Admitting: Radiation Oncology

## 2015-11-29 ENCOUNTER — Ambulatory Visit: Payer: BLUE CROSS/BLUE SHIELD | Admitting: Radiation Oncology

## 2015-11-29 DIAGNOSIS — Z510 Encounter for antineoplastic radiation therapy: Secondary | ICD-10-CM | POA: Diagnosis not present

## 2015-12-01 ENCOUNTER — Encounter: Payer: Self-pay | Admitting: Radiation Oncology

## 2015-12-01 ENCOUNTER — Ambulatory Visit
Admission: RE | Admit: 2015-12-01 | Discharge: 2015-12-01 | Disposition: A | Discharge status: Home / Self Care | Payer: BLUE CROSS/BLUE SHIELD | Source: Ambulatory Visit | Attending: Radiation Oncology | Admitting: Radiation Oncology

## 2015-12-01 ENCOUNTER — Telehealth: Payer: Self-pay | Admitting: Genetic Counselor

## 2015-12-01 VITALS — BP 148/86 | HR 102 | Temp 98.2°F | Resp 20 | Wt 208.4 lb

## 2015-12-01 DIAGNOSIS — Z510 Encounter for antineoplastic radiation therapy: Secondary | ICD-10-CM | POA: Diagnosis not present

## 2015-12-01 DIAGNOSIS — C7801 Secondary malignant neoplasm of right lung: Secondary | ICD-10-CM

## 2015-12-01 DIAGNOSIS — C189 Malignant neoplasm of colon, unspecified: Secondary | ICD-10-CM

## 2015-12-01 DIAGNOSIS — C7800 Secondary malignant neoplasm of unspecified lung: Secondary | ICD-10-CM | POA: Insufficient documentation

## 2015-12-01 NOTE — Telephone Encounter (Signed)
Discussed with Bill Wright that his genetic test result was negative for the pathogenic TP53 mutation, "c.672+1G>A (IVS6+1G>A)" that was identified on [facility] testing of his colon tumor.  Discussed that this is most likely a reassuring test result that tells [place] that his colon cancer was likely sporadic and not hereditary.  Discussed that there could still be a genetic mutation in another gene or somewhere else in the TP53 gene, but that, at this point in time, this is unlikely and there is no indication to order any additional testing.  There is no other known colon cancer in the family and his tumor testing was also normal.  Encouraged him to call and update the family history with [place] in the future, if someone else in the family is diagnosed with cancer.  Discussed that he should continue to follow his doctors' recommendations for future cancer screening.  His kids can begin colonoscopy screening at the age of 19 based on his age of diagnosis and can have those every 5 years based on current guidelines.  He is welcome to call with any questions he has.

## 2015-12-01 NOTE — Progress Notes (Signed)
   Department of Radiation Oncology  Phone:  [phone] Fax:        [phone]  Weekly Treatment Note    Name: Bill Wright Date: 12/01/2015 MRN: [id number] DOB: [birth year]   Diagnosis:     ICD-9-CM ICD-10-CM   1. Colon cancer metastasized to lung [facility]) s/p laparoscopic assisted sigmoid colectomy 11/02/15 153.9 C18.9    197.0 C78.00   2. Malignant neoplasm metastatic to right lung ([facility]) 197.0 C78.01      Current dose: 36 Gy  Current fraction: 2   MEDICATIONS: Current Outpatient Prescriptions  Medication Sig Dispense Refill  . clindamycin (CLINDAGEL) 1 % gel Apply topically 2 (two) times daily. 30 g 5  . hydrocortisone 2.5 % lotion APPLY EXTERNALLY TO THE AFFECTED AREA TWICE DAILY 200 mL 0   No current facility-administered medications for this encounter.     ALLERGIES: Review of patient's allergies indicates no known allergies.   LABORATORY DATA:  Lab Results  Component Value Date   WBC 5.8 11/15/2015   HGB 13.0 11/15/2015   HCT 38.9 11/15/2015   MCV 87.5 11/15/2015   PLT 299 11/15/2015   Lab Results  Component Value Date   NA 140 11/15/2015   K 3.6 11/15/2015   CL 103 11/03/2015   CO2 23 11/15/2015   Lab Results  Component Value Date   ALT 47 11/15/2015   AST 31 11/15/2015   ALKPHOS 75 11/15/2015   BILITOT 0.46 11/15/2015     NARRATIVE: Bill Wright was seen today for weekly treatment management. The chart was checked and the patient's films were reviewed.  SBRT # 2/ 3 lung,completed,  no c/o pain, nausea, no coughing, appetite good, no difficulty swallowing foods or fluids, 1 month f/u appt given  Wt Readings from Last 3 Encounters:  12/01/15 208 lb 6.4 oz (94.53 kg)  11/15/15 208 lb 4.8 oz (94.484 kg)  11/15/15 207 lb 8 oz (94.121 kg)   5:26 PM BP 148/86 mmHg  Pulse 102  Temp(Src) 98.2 F (36.8 C) (Oral)  Resp 20  Wt 208 lb 6.4 oz (94.53 kg)  PHYSICAL EXAMINATION: weight is 208 lb 6.4 oz (94.53 kg). His oral temperature is 98.2  F (36.8 C). His blood pressure is 148/86 and his pulse is 102. His respiration is 20.        ASSESSMENT: The patient is doing satisfactorily with treatment.  PLAN: We will continue with the patient's radiation treatment as planned.

## 2015-12-01 NOTE — Progress Notes (Signed)
[facility] Radiation Oncology Simulation and Treatment Planning Note   Name:  Bill Wright MRN: [place]:[id number]   Date: 12/01/2015  DOB: March 04, 1961  Status:outpatient    DIAGNOSIS:    ICD-9-CM ICD-10-CM   1. Malignant neoplasm metastatic to right lung ([place]) 197.0 C78.01       CONSENT VERIFIED:yes   SET UP: Patient is setup supine   IMMOBILIZATION: The patient was immobilized using a Vac Loc bag and Abdominal Compression.   NARRATIVE:The patient was brought to the [facility].  Identity was confirmed.  All relevant records and images related to the planned course of therapy were reviewed.  Then, the patient was positioned in a stable reproducible clinical set-up for radiation therapy. Abdominal compression was applied by me.  4D CT images were obtained and reproducible breathing pattern was confirmed. Free breathing CT images were obtained.  Skin markings were placed.  The CT images were loaded into the planning software where the target and avoidance structures were contoured.  The radiation prescription was entered and confirmed.    TREATMENT PLANNING NOTE:  Treatment planning then occurred. I have requested : MLC's, isodose plan, basic dose calculation.  3 dimensional simulation is performed and dose volume histogram of the gross tumor volume, planning tumor volume and criticial normal structures including the spinal cord and lungs were analyzed and requested.  Special treatment procedure was performed due to high dose per fraction.  The patient will be monitored for increased risk of toxicity.  Daily imaging using cone beam CT will be used for target localization.  ------------------------------------------------  Jodelle Gross, MD, PhD

## 2015-12-01 NOTE — Progress Notes (Addendum)
SBRT # 2/ 3 lung,completed,  no c/o pain, nausea, no coughing, appetite good, no difficulty swallowing foods or fluids, 1 month f/u appt given  Wt Readings from Last 3 Encounters:  11/15/15 208 lb 4.8 oz (94.484 kg)  11/15/15 207 lb 8 oz (94.121 kg)  11/02/15 212 lb (96.163 kg)   2:28 PM BP 148/86 mmHg  Pulse 102  Temp(Src) 98.2 F (36.8 C) (Oral)  Resp 20  Wt 208 lb 6.4 oz (94.53 kg)

## 2015-12-05 ENCOUNTER — Encounter: Payer: Self-pay | Admitting: Radiation Oncology

## 2015-12-05 ENCOUNTER — Ambulatory Visit
Admission: RE | Admit: 2015-12-05 | Discharge: 2015-12-05 | Disposition: A | Discharge status: Home / Self Care | Payer: BLUE CROSS/BLUE SHIELD | Source: Ambulatory Visit | Attending: Radiation Oncology | Admitting: Radiation Oncology

## 2015-12-05 DIAGNOSIS — Z510 Encounter for antineoplastic radiation therapy: Secondary | ICD-10-CM | POA: Diagnosis not present

## 2015-12-06 ENCOUNTER — Ambulatory Visit: Payer: Self-pay | Admitting: Genetic Counselor

## 2015-12-06 ENCOUNTER — Ambulatory Visit: Payer: BLUE CROSS/BLUE SHIELD | Admitting: Radiation Oncology

## 2015-12-06 DIAGNOSIS — Z803 Family history of malignant neoplasm of breast: Secondary | ICD-10-CM

## 2015-12-06 DIAGNOSIS — Z1379 Encounter for other screening for genetic and chromosomal anomalies: Secondary | ICD-10-CM | POA: Insufficient documentation

## 2015-12-06 DIAGNOSIS — Z800 Family history of malignant neoplasm of digestive organs: Secondary | ICD-10-CM

## 2015-12-06 DIAGNOSIS — C189 Malignant neoplasm of colon, unspecified: Secondary | ICD-10-CM

## 2015-12-06 DIAGNOSIS — Z85828 Personal history of other malignant neoplasm of skin: Secondary | ICD-10-CM

## 2015-12-06 DIAGNOSIS — C7800 Secondary malignant neoplasm of unspecified lung: Secondary | ICD-10-CM

## 2015-12-06 NOTE — Progress Notes (Signed)
GENETIC TEST RESULT  HPI: [name] was previously seen in the [place] clinic due to a personal history of colon cancer, family history of cancer, and concerns regarding a hereditary predisposition to cancer. Please refer to our prior cancer genetics clinic note from Nov 09, 2015 for more information regarding [name] medical, social and family histories, and our assessment and recommendations, at the time. [name] recent genetic test results were disclosed to him, as were recommendations warranted by these results. These results and recommendations are discussed in more detail below.  GENETIC TEST RESULTS: At the time of [name]'s visit on 11/09/15.  While [name] did not meet criteria for genetic panel testing, based on the finding of a pathogenic TP53 gene mutation on his [facility] testing, we recommended he pursue targeted testing to determine if this TP53 gene mutation was a germline vs sporadic mutation.  Testing was performed via the Targeted TP53 Variant Test through [facility] in [place], [place].  Those results are now back, the report date for which is Nov 30, 2015.  Genetic testing was normal and did NOT reveal presence of the pathogenic TP53 gene mutation, "c.672+1G>A (IVS6+1G>A)" which was previously identified on [facility] testing. The test report will be scanned into EPIC and will be located under the Results Review tab in the Pathology>Molecular Pathology section.   We discussed with [name] that since the current genetic testing is not perfect, it is possible there may be a gene mutation in another part of this gene that this test did not look at, but that chance is small. We also discussed, that it is possible that another gene that was not included on testing, is responsible for the cancer diagnoses in the family, and it is, therefore, important to remain in touch with cancer genetics in the future so that we can continue to offer Mr.  Bill Wright the most up-to-date genetic testing.    CANCER SCREENING RECOMMENDATIONS: This result is reassuring and indicates that [name] likely does not have an increased risk for a future cancer due to a mutation in one of these genes. This normal test also suggests that [name] cancer was most likely not due to an inherited predisposition associated with one of these genes.  Most cancers happen by chance and this negative test suggests that his cancer falls into this category.  We, therefore, recommended he continue to follow the cancer management and screening guidelines provided by his oncology and primary healthcare providers.   RECOMMENDATIONS FOR FAMILY MEMBERS: Men and women in this family might be at some increased risk of developing cancer, over the general population risk, simply due to the family history of cancer. We recommended women in this family have a yearly mammogram beginning at age 54, or 11 years younger than the earliest onset of cancer, an an annual clinical breast exam, and perform monthly breast self-exams. Women in this family should also have a gynecological exam as recommended by their primary provider. All family members should have a colonoscopy by age 54.  [name] children can begin colonoscopy screening at the age of 54 due to his age (under age 73) at diagnosis of colon cancer; they should have colonoscopies every 5 years or more often based on positive findings.   FOLLOW-UP: Lastly, we discussed with [name] that cancer genetics is a rapidly advancing field and it is possible that new genetic tests will be appropriate for him and/or his family members in the future.  We encouraged him to remain in contact with cancer genetics on an annual basis so we can update his personal and family histories and let him know of advances in cancer genetics that may benefit this family.   Our contact number was provided. [name] questions were answered to his  satisfaction, and she knows he is welcome to call us at anytime with additional questions or concerns.   Jeanine Luz, MS, [facility] Certified Genetic Counselor [email or web address] Phone: [phone]

## 2015-12-07 ENCOUNTER — Ambulatory Visit: Payer: BLUE CROSS/BLUE SHIELD | Admitting: Radiation Oncology

## 2015-12-08 ENCOUNTER — Telehealth: Payer: Self-pay | Admitting: *Deleted

## 2015-12-08 ENCOUNTER — Ambulatory Visit: Payer: BLUE CROSS/BLUE SHIELD | Admitting: Radiation Oncology

## 2015-12-08 NOTE — Telephone Encounter (Signed)
Returned call to patient ,per MD to try Robitussin for his recent cough s/p SBRT to his lung completed 12/05/15, will check on patient ststus 1 week ,if still having coughing will bring him in for a follow up, patient thanked this RN will let Elray Buba RN also know since he had called Med Onc/ as well 11:21 AM'

## 2015-12-08 NOTE — Telephone Encounter (Signed)
Pt called requested steroids to help with the cough after radiation treatment.  Spoke with pt and was informed that pt completed radiation treatment on 12/05/15.  Stated he developed a cough after radiation.  Instructed pt to call Dr. Ida Rogue nurse for further instructions.  Pt voiced understanding. Pt's   Phone    [phone].

## 2015-12-11 ENCOUNTER — Ambulatory Visit: Payer: BLUE CROSS/BLUE SHIELD | Admitting: Radiation Oncology

## 2015-12-13 ENCOUNTER — Telehealth: Payer: Self-pay | Admitting: *Deleted

## 2015-12-13 NOTE — Telephone Encounter (Signed)
Returned call to patient, after taking 2 bottle of robitussin patient still has this persistent cough and wants to be sen, will have him come in Friday morning at 815am to See MD,  And after will see Dr. Burr Medico  Has scheduled appt with her, patient stated he will be here at 815am to see MD,thanked this RN  For getting him in sooner this month, no fever or throwing up stated 10:57 AM

## 2015-12-13 NOTE — Telephone Encounter (Signed)
error 

## 2015-12-15 ENCOUNTER — Telehealth: Payer: Self-pay | Admitting: Hematology

## 2015-12-15 ENCOUNTER — Ambulatory Visit (HOSPITAL_BASED_OUTPATIENT_CLINIC_OR_DEPARTMENT_OTHER): Payer: BLUE CROSS/BLUE SHIELD | Admitting: Hematology

## 2015-12-15 ENCOUNTER — Encounter: Payer: Self-pay | Admitting: Hematology

## 2015-12-15 ENCOUNTER — Other Ambulatory Visit (HOSPITAL_BASED_OUTPATIENT_CLINIC_OR_DEPARTMENT_OTHER): Payer: BLUE CROSS/BLUE SHIELD

## 2015-12-15 ENCOUNTER — Ambulatory Visit
Admission: RE | Admit: 2015-12-15 | Discharge: 2015-12-15 | Disposition: A | Discharge status: Home / Self Care | Payer: BLUE CROSS/BLUE SHIELD | Source: Ambulatory Visit | Attending: Radiation Oncology | Admitting: Radiation Oncology

## 2015-12-15 ENCOUNTER — Encounter: Payer: Self-pay | Admitting: Radiation Oncology

## 2015-12-15 VITALS — BP 139/85 | HR 89 | Resp 20 | Ht 66.0 in | Wt 211.5 lb

## 2015-12-15 VITALS — BP 139/85 | HR 105 | Temp 97.9°F | Resp 20 | Ht 66.0 in | Wt 212.2 lb

## 2015-12-15 DIAGNOSIS — C7801 Secondary malignant neoplasm of right lung: Secondary | ICD-10-CM

## 2015-12-15 DIAGNOSIS — R21 Rash and other nonspecific skin eruption: Secondary | ICD-10-CM | POA: Diagnosis not present

## 2015-12-15 DIAGNOSIS — C7802 Secondary malignant neoplasm of left lung: Secondary | ICD-10-CM

## 2015-12-15 DIAGNOSIS — C187 Malignant neoplasm of sigmoid colon: Secondary | ICD-10-CM

## 2015-12-15 DIAGNOSIS — C7800 Secondary malignant neoplasm of unspecified lung: Principal | ICD-10-CM

## 2015-12-15 DIAGNOSIS — C189 Malignant neoplasm of colon, unspecified: Secondary | ICD-10-CM

## 2015-12-15 LAB — CBC WITH DIFFERENTIAL/PLATELET
BASO%: 0.6 % (ref 0.0–2.0)
Basophils Absolute: 0 10*3/uL (ref 0.0–0.1)
EOS%: 1.3 % (ref 0.0–7.0)
Eosinophils Absolute: 0.1 10*3/uL (ref 0.0–0.5)
HEMATOCRIT: 40.2 % (ref 38.4–49.9)
HGB: 13.6 g/dL (ref 13.0–17.1)
LYMPH#: 0.8 10*3/uL — AB (ref 0.9–3.3)
LYMPH%: 15.2 % (ref 14.0–49.0)
MCH: 29.2 pg (ref 27.2–33.4)
MCHC: 33.7 g/dL (ref 32.0–36.0)
MCV: 86.6 fL (ref 79.3–98.0)
MONO#: 0.6 10*3/uL (ref 0.1–0.9)
MONO%: 11.9 % (ref 0.0–14.0)
NEUT%: 71 % (ref 39.0–75.0)
NEUTROS ABS: 3.6 10*3/uL (ref 1.5–6.5)
PLATELETS: 172 10*3/uL (ref 140–400)
RBC: 4.64 10*6/uL (ref 4.20–5.82)
RDW: 13.2 % (ref 11.0–14.6)
WBC: 5 10*3/uL (ref 4.0–10.3)

## 2015-12-15 LAB — COMPREHENSIVE METABOLIC PANEL
ALT: 31 U/L (ref 0–55)
AST: 23 U/L (ref 5–34)
Albumin: 4 g/dL (ref 3.5–5.0)
Alkaline Phosphatase: 95 U/L (ref 40–150)
Anion Gap: 10 mEq/L (ref 3–11)
BILIRUBIN TOTAL: 0.43 mg/dL (ref 0.20–1.20)
BUN: 13.5 mg/dL (ref 7.0–26.0)
CHLORIDE: 105 meq/L (ref 98–109)
CO2: 23 meq/L (ref 22–29)
CREATININE: 0.8 mg/dL (ref 0.7–1.3)
Calcium: 9.5 mg/dL (ref 8.4–10.4)
EGFR: >90 mL/min/{1.73_m2} (ref >90)
GLUCOSE: 93 mg/dL (ref 70–140)
Potassium: 4 mEq/L (ref 3.5–5.1)
Sodium: 138 mEq/L (ref 136–145)
TOTAL PROTEIN: 7.7 g/dL (ref 6.4–8.3)

## 2015-12-15 NOTE — Telephone Encounter (Signed)
Gave and printed appt sched and avs for Aug...gv barium

## 2015-12-15 NOTE — Progress Notes (Signed)
Follow s/p SBRT lung 12/05/15, no EOT note, patient has  Taking robitussin for unproducative cough, helps some but still has cough, the other day had a spasm of coughing and coughed up bile,  C/o head ache frontal takes 600mg  advil daily no other issues BP 139/85 mmHg  Pulse 105  Temp(Src) 97.9 F (36.6 C) (Oral)  Resp 20  Ht 5\' 6"  (1.676 m)  Wt 212 lb 3.2 oz (96.253 kg)  BMI 34.27 kg/m2  SpO2 100%  Wt Readings from Last 3 Encounters:  12/15/15 212 lb 3.2 oz (96.253 kg)  12/01/15 208 lb 6.4 oz (94.53 kg)  11/15/15 208 lb 4.8 oz (94.484 kg)   .

## 2015-12-15 NOTE — Progress Notes (Signed)
  Radiation Oncology         [phone]) [phone] ________________________________  Name: Bill Wright MRN: [id number]  Date: 12/15/2015  DOB: 03/16/1961  Follow-Up Visit Note  CC: Lujean Amel, MD  Jackolyn Confer, MD  Diagnosis:      ICD-9-CM ICD-10-CM   1. Malignant neoplasm metastatic to right lung ([place]) 197.0 C78.01      Interval Since Last Radiation:  9 days   Narrative:  The patient returns today for follow-up.  The patient began experiencing a cough shortly after treatment. He has been taking over-the-counter cough medicine which has worked well but the patient has been concerned about possible reaction for radiation treatment and wanted to be seen today. He is seeing medical oncology shortly after this. The patient states that this is a nonproductive cough. He denies any fevers or significant shortness of breath. No discomfort in the chest area. The patient has had some frontal headaches as well. He states he has a history of allergies but is not aware of any sinus issues.                              ALLERGIES:  has No Known Allergies.  Meds: Current Outpatient Prescriptions  Medication Sig Dispense Refill  . clindamycin (CLINDAGEL) 1 % gel Apply topically 2 (two) times daily. 30 g 5  . guaifenesin (ROBITUSSIN) 100 MG/5ML syrup Take 200 mg by mouth 3 (three) times daily as needed for cough.    Marland Kitchen HYDROcodone-acetaminophen (NORCO/VICODIN) 5-325 MG tablet Take 1 tablet by mouth every 4 (four) hours as needed.  0  . hydrocortisone 2.5 % lotion APPLY EXTERNALLY TO THE AFFECTED AREA TWICE DAILY 200 mL 0  . ibuprofen (ADVIL,MOTRIN) 200 MG tablet Take 600 mg by mouth every 6 (six) hours as needed.     No current facility-administered medications for this encounter.    Physical Findings: The patient is in no acute distress. Patient is alert and oriented.  height is 5\' 6"  (1.676 m) and weight is 212 lb 3.2 oz (96.253 kg). His oral temperature is 97.9 F (36.6 C). His blood pressure is  139/85 and his pulse is 105. His respiration is 20 and oxygen saturation is 100%. .     Lab Findings: Lab Results  Component Value Date   WBC 5.8 11/15/2015   HGB 13.0 11/15/2015   HCT 38.9 11/15/2015   MCV 87.5 11/15/2015   PLT 299 11/15/2015     Radiographic Findings: No results found.  Impression:    I had a chance to review the patient's treatment plan as well to review specifically the radiation dosing to the lungs. The patient's plan predict a low likelihood of pneumonitis. Also the timeframe of his complaint does not fit this picture as this is typically seen for 6 weeks after completion of radiation treatment. The initiation of his symptoms immediately after radiation treatment essentially rules this out as a present concern. The association with some frontal headaches raises the possibility of sinus issues/upper respiratory infection. I encouraged the patient to continue his use of cough medicine and also suggested a decongestant as well.  Plan:  The patient was reassured today that his symptoms do not represent a severe side effects from radiation treatment. He is scheduled to see medical oncology following this visit. The patient knows to contact our office if his cough worsens.   Jodelle Gross, M.D., Ph.D.

## 2015-12-15 NOTE — Progress Notes (Signed)
[place]  Telephone:(336) [phone] Fax:(336) [phone]  Clinic Follow Up Note   Patient Care Team: Dibas Koirala, MD as PCP - General (Family Medicine) Wilford Corner, MD as Consulting Physician (Gastroenterology) Jackolyn Confer, MD as Consulting Physician (General Surgery) Truitt Merle, MD as Consulting Physician (Hematology) 12/15/2015  CHIEF COMPLAINTS:  Follow Up colon cancer  Oncology History   T2, Colon cancer metastasized to lung [facility])   Staging form: Colon and Rectum, AJCC 7th Edition     Clinical stage from 03/30/2015: Stage Unknown ([place], N1, M1) - Unsigned       Colon cancer metastasized to lung [facility]) s/p laparoscopic assisted sigmoid colectomy 11/02/15   03/30/2015 Miscellaneous [facility] genomic testing showed TP53 mutation, MSI stable, low tumor mutation burden. Negative for K-ras, NRAS and BRAF   03/30/2015 Initial Biopsy Sigmoid mass biopsy showed invasive adenocarcinoma. Cecal colon polyps showed tubular adenoma.   03/30/2015 Initial Diagnosis Colon cancer   03/30/2015 Procedure colonoscopy by Dr. Michail Sermon showed a fungating, infiltrative and ulcerated nonobstructing large mass in the sigmoid colon and at 20 cm proximal to the anus. The mass was partially circumferential no bleeding. A 10 mm polyps in the cecum was removed.   04/03/2015 Imaging CT chest, abdomen and pelvis with contrast showed nodular masslike area of clinical worsening at rectosigmoid junction, tiny pericolonic lymph nodes, bilateral pulmonary nodules measuring about 1 cm.   04/12/2015 PET scan Hypermetabolic colonic mass near rectosigmoid junction, tiny subcentimeter paracolonic lymph nodes. Bilateral pulmonary metastasis.   04/19/2015 Procedure CT-guided lung nodule biopsy attempted, unsuccessful.   04/27/2015 - 09/14/2015 Chemotherapy Oxaliplatin 130 mg/m on day 1, Capecitabine 2371m q12hr, 2 weeks on and one week off (only received 7 days for first cycle), oxaliplatin held on cycle 5  and dose reduced to 1018mm2, capecitabine reduced to 200085m12h D1-14, stopped per pt's request.    06/29/2015 - 10/19/2015 Chemotherapy  Panitumumab every 2 weeks, some cycles were postponed due to pt's request, stoppe per pt's request    09/18/2015 Imaging Pulmonary nodules are less hypermetabolic, stable size. Stable hypermetabolic portal hepatis and abdominal peritoneal ligament lymph nodes. No other new lesions.    11/02/2015 Surgery sigmoid colon segmental resection    11/02/2015 Pathology Results Sigmoid colon segmental resection showed adenocarcinoma, grade 3,  T2, 3 out of 13 lymph nodes were positive, surgical margins were negative. LVI(-), perineural invasion negative   11/13/2015 Imaging CT chest, abdomen and pelvis with contrast showed postsurgical changes, mild progression of pulmonary metastasis, measuring up to 15 mm in the right lower lobe.   11/29/2015 - 12/05/2015 Radiation Therapy SBRT to 4 lung lesions in 3 sessions     HISTORY OF PRESENTING ILLNESS:  Bill Wright 55o. male is here because of recently newly diagnosed colon cancer.  He has had intermittent bloody stool for 2 years, it has been mild, mixed with stool, patient does not have any abdominal pain, constipation, change of his bowel habits, nausea, weight loss or other symptoms. He did not seek medical attention for this. He went to emergency room on 03/15/2015 for right flank pain, due to his kidney stone. CT scan incidentally found a 11 mm right lower lobe nodule and mucosal edema in the sigmoid colon. He saw his primary care physician, and was referred to GI Dr. SchMichail Sermonre at he underwent colonoscopy on 03/30/2015, which showed a fungating infiltrative and ulcerated nonobstructing large mass in the sigmoid colon, biopsy showed adenocarcinoma. CT chest abdomen and pelvis showed multiple lung nodules measuring  about 1 cm. He was referred to surgeon Dr. Zella Richer, who referred patient to [place] for further workup of his lung  nodule and discuss chemotherapy.  He feels very well overall, denies any symptoms. He is a Freight forwarder at [employer], lives with his wife and 3 children. He never had screening colonoscopy prior the reason one, no significant past medical history, does not see doctors regularly.  CURRENT TREATMENT: observation  INTERIM HISTORY Bill Wright returns for follow-up. He received 3 sessions of SBRT to 4 of his lung lesions, tolerated well, he has mild cough, no dyspnea or chest pain, All other complaints. He has recovered well  His colon surgery, still occasional low abdominal cramps, no other discomfort, his BM is normal, His appetite is great.  His energy level has no back to normal, able to tolerate routine activities, but feels wore out afterwards.   MEDICAL HISTORY:  Past Medical History  Diagnosis Date  . Kidney calculi   . Hypercholesteremia   . Anxiety     situational due to cancer diagnosis  . Cancer ([place]) 2017    colon-chemo 09/22/15 now surgery    SURGICAL HISTORY: Past Surgical History  Procedure Laterality Date  . Colonoscopy  03/30/15  . Laparoscopic partial colectomy N/A 11/02/2015    Procedure: LAPAROSCOPIC ASSISTED SIGMOID COLECTOMY;  Surgeon: Jackolyn Confer, MD;  Location: [facility] ORS;  Service: General;  Laterality: N/A;    SOCIAL HISTORY: Social History   Social History  . Marital Status: Married    Spouse Name: N/A  . Number of Children: 3, age of 80, 75 and 55   . Years of Education: N/A   Occupational History  . Banker for [employer] [place]    Social History Main Topics  . Smoking status: Never Smoker   . Smokeless tobacco: Not on file  . Alcohol Use: No  . Drug Use: No  . Sexual Activity: Not on file   Other Topics Concern  . Not on file   Social History Narrative    FAMILY HISTORY: Family History  Problem Relation Age of Onset  . Breast cancer Mother 61    +rad and lymph node  . Diabetes Maternal Grandmother     leading to blindness  . Obesity Maternal  Aunt   . Stroke Maternal Uncle 65    ALLERGIES:  has No Known Allergies.  MEDICATIONS:  Current Outpatient Prescriptions  Medication Sig Dispense Refill  . clindamycin (CLINDAGEL) 1 % gel Apply topically 2 (two) times daily. 30 g 5  . hydrocortisone 2.5 % lotion APPLY EXTERNALLY TO THE AFFECTED AREA TWICE DAILY 200 mL 0   No current facility-administered medications for this visit.    REVIEW OF SYSTEMS:   Constitutional: Denies fevers, chills or abnormal night sweats, no weight loss. Eyes: Denies blurriness of vision, double vision or watery eyes Ears, nose, mouth, throat, and face: Denies mucositis or sore throat Respiratory: Denies cough, dyspnea or wheezes Cardiovascular: Denies palpitation, chest discomfort or lower extremity swelling Gastrointestinal:  Denies nausea, heartburn or change in bowel habits Skin: Denies abnormal skin rashes Lymphatics: Denies new lymphadenopathy or easy bruising Neurological:Denies numbness, tingling or new weaknesses Behavioral/Psych: Mood is stable, no new changes  All other systems were reviewed with the patient and are negative.  PHYSICAL EXAMINATION: ECOG PERFORMANCE STATUS: 0 - Asymptomatic BP 139/85 mmHg  Pulse 89  Resp 20  Ht _0  (1.676 m)  Wt 211 lb 8 oz (95.936 kg)  BMI 34.15 kg/m2  SpO2 100% GENERAL:alert,  no distress and comfortable SKIN: skin color, texture, turgor are normal, diffuse acne-like rash on his front chest and back, scattered rash on his neck and face, there is a healing large boil above his upper lip, no discharge  EYES: normal, conjunctiva are pink and non-injected, sclera clear OROPHARYNX:no exudate, no erythema and lips, buccal mucosa, and tongue normal  NECK: supple, thyroid normal size, non-tender, without nodularity LYMPH:  no palpable lymphadenopathy in the cervical, axillary or inguinal LUNGS: clear to auscultation and percussion with normal breathing effort HEART: regular rate & rhythm and no murmurs  and no lower extremity edema ABDOMEN:abdomen soft, non-tender and normal bowel sounds. The midline incision has healed well, no discharge or skin erythema  Musculoskeletal:no cyanosis of digits and no clubbing  PSYCH: alert & oriented x 3 with fluent speech NEURO: no focal motor/sensory deficits  LABORATORY DATA:  I have reviewed the data as listed CBC Latest Ref Rng 12/15/2015 11/15/2015 11/03/2015  WBC 4.0 - 10.3 10e3/uL 5.0 5.8 13.3(H)  Hemoglobin 13.0 - 17.1 g/dL 13.6 13.0 12.9(L)  Hematocrit 38.4 - 49.9 % 40.2 38.9 38.3(L)  Platelets 140 - 400 10e3/uL 172 299 236   CMP Latest Ref Rng 12/15/2015 11/15/2015 11/03/2015  Glucose 70 - 140 mg/dl 93 110 158(H)  BUN 7.0 - 26.0 mg/dL 13.5 8.3 9  Creatinine 0.7 - 1.3 mg/dL 0.8 0.8 0.84  Sodium 136 - 145 mEq/L 138 140 138  Potassium 3.5 - 5.1 mEq/L 4.0 3.6 4.4  Chloride 101 - 111 mmol/L - - 103  CO2 22 - 29 mEq/L _0 Calcium 8.4 - 10.4 mg/dL 9.5 9.2 8.7(L)  Total Protein 6.4 - 8.3 g/dL 7.7 7.3 -  Total Bilirubin 0.20 - 1.20 mg/dL 0.43 0.46 -  Alkaline Phos 40 - 150 U/L 95 75 -  AST 5 - 34 U/L 23 31 -  ALT 0 - 55 U/L 31 47 -    PATHOLOGY REPORT  Diagnosis 11/02/2015 1. Colon, segmental resection for tumor, sigmoid ADENOCARCINOMA OF THE SIGMOID COLON (2.0 CM), GRADE 3 THE TUMOR INVADES MUSCULARIS PROPRIA MARGINS OF RESECTION ARE NEGATIVE METASTATIC ADENOCARCINOMA IN THREE OF THIRTEEN LYMPH NODES (3/13) 2. Colon, resection margin (donut), distal sigmoid BENIGN COLONIC TISSUE Microscopic Comment 1. COLON AND RECTUM (INCLUDING TRANS-ANAL RESECTION): Specimen: Sigmoid Procedure: Segmental resection Tumor site: sigmoid Specimen integrity: Intact Macroscopic intactness of mesorectum: Not applicable: x Complete: NA Near complete: NA Incomplete: NA Cannot be determined (specify): NA Macroscopic tumor perforation: Muscularis Invasive tumor: Maximum size: 2.0 cm Histologic type(s): Adenocarcinoma Histologic grade and  differentiation: G3 G1: well differentiated/low grade G2: moderately differentiated/low grade G3: poorly differentiated/high grade G4: undifferentiated/high grade Type of polyp in which invasive carcinoma arose: Tubular adenoma Microscopic extension of invasive tumor: Muscularis propria Lymph-Vascular invasion: Negative Peri-neural invasion: Negative Tumor deposit(s) (discontinuous extramural extension): Negative Resection margins: Proximal margin: Negative Distal margin: Negative Circumferential (radial) (posterior ascending, posterior descending; lateral and posterior mid-rectum; and entire lower 1/3 rectum):Negative Mesenteric margin (sigmoid and transverse): Negative Distance closest margin (if all above margins negative): 3.5 cm Trans-anal resection margins only: Deep margin: NA Mucosal Margin: NA Distance closest mucosal margin (if negative): NA Treatment effect (neo-adjuvant therapy): Partial Additional polyp(s): None Non-neoplastic findings: unremarkable Lymph nodes: number examined 13; number positive: 3 Pathologic Staging: T2, N1b, M1a     RADIOGRAPHIC STUDIES: I have personally reviewed the radiological images as listed and agreed with the findings in the report.  PET 04/12/2015 IMPRESSION: Hypermetabolic colonic mass near rectosigmoid junction, consistent with known primary  colon carcinoma.  Tiny sub-cm pericolonic lymph nodes in sigmoid mesocolon are too small to characterize by PET, but are suspicious for early lymph node metastases.  Bilateral pulmonary metastases  PET 09/18/2015 IMPRESSION: 1. Hypermetabolic pulmonary metastases have enlarged slightly from 07/18/2015. 2. Hypermetabolic porta hepatis/abdominal peritoneal ligament lymph nodes, stable from 04/12/2015. 3. Increase in hypermetabolism associated with mesenteric haziness and nodularity. While a reactive phenomenon/panniculitis can create this in appearance, metastatic  disease/lymphoproliferative disorder cannot be excluded. 4. Hepatic steatosis. 5. Bilateral nephrolithiasis.  CT chest, abdomen and pelvis with contrast 11/13/2015 IMPRESSION: Postsurgical changes related to partial left hemicolectomy.  Small upper abdominal lymph nodes measuring up to 10 mm short axis, nonspecific. Mild nonspecific jejunal mesentery stranding without discrete lymphadenopathy, improved.  Mild progression of pulmonary metastases, measuring up to 15 mm in the right lower lobe.   ASSESSMENT & PLAN:  56 year old male, without significant past medical history except kidney stone, presented with intermittent bloody stool for 2 years, and colonoscopy showed a large sigmoid colon mass, CT scan showed multiple (at least 4) nodules in bilateral lungs, measuring about 1 cm.  1. Sigmoid colon adenocarcinoma, TxN1M1, probably stage IV with lung mets  -I previously reviewed his colonoscopy, CT scan findings and the biopsy results in great details with patient and his wife. -I reviewed his surgical pathology findings, which showed a residual T2 primary tumor, 3 lymph nodes positive, grade 3 disease, certainly high risk disease.   -We discussed that his disease is likely incurable, and the goal of therapy is maximum disease control and prolong his life.  -- His tumor does not contain KRAS/NRAS or BRAF mutation, so he would benefit from EGFR antibody, Panitumumab was added on from cycle 4. He received a total of 4 month of chemotherapy which was held per patient's request.He declined more chemo after his colon surgery.  --I personally reviewed his CT scan image from 11/13/2015 with him, he has at least 4 lung nodules in bilateral lungs, measuring up to 1.5cm, which got smaller after chemo, and now progressed again, these lesions are most consistent with metastatic disease from his colon cancer. He received SBRT to these 4 lung lesions in 11/2015.  -I strongly encouraged him to consider  maintenance Xeloda and panitumumab therapy after his radiation, but he declined. He understands the risk that his cancer is likely recur in the future. -we will start surveillance. I plan to repeat a CT chest abdomen and pelvis in 2 months  -Today's lab result reviewed with patient, which are normal  2. Skin rashes -Secondary to panitumumab -resolved now   Plan -continue surveillance -Return to clinic in 2 months with lab and CT chest abdomen and pelvis with contrast before the visit -he plan to return to work on 02/06/2016  I spent 15 minutes counseling the patient face to face. The total time spent in the appointment was 20 minutes and more than 50% was on counseling.     Truitt Merle, MD 12/15/2015

## 2015-12-16 LAB — CEA: CEA: 1.1 ng/mL (ref 0.0–4.7)

## 2016-01-03 ENCOUNTER — Encounter: Payer: Self-pay | Admitting: Hematology

## 2016-01-03 NOTE — Progress Notes (Signed)
left in my box °

## 2016-01-04 ENCOUNTER — Encounter: Payer: Self-pay | Admitting: Hematology

## 2016-01-04 NOTE — Progress Notes (Signed)
left in my box- left for dr. Burr Medico to sign-faxed [phone] and sent to medical recrds

## 2016-01-04 NOTE — Progress Notes (Signed)
left in my box- left for dr. Burr Medico to sign

## 2016-01-10 ENCOUNTER — Telehealth: Payer: Self-pay | Admitting: *Deleted

## 2016-01-10 ENCOUNTER — Other Ambulatory Visit: Payer: Self-pay | Admitting: *Deleted

## 2016-01-10 NOTE — Telephone Encounter (Signed)
Pt called and left message re:  Pt has had persistent cough since last radiation treatment.  Pt had contacted radiation provider and was instructed to take OTC cough medicine.   However, pt still has the consistent cough.  Wanted to know who -  Radiation oncologist,  Dr. Burr Medico,  Or his  PCP -  To prescribe  Steroids for pt to help with the cough. Noted pt has follow up appt with radiation PA  Ebony Hail on 01/11/16.   Spoke with Ebony Hail and informed her of pt's coughing issue.   Ebony Hail stated she would follow up with pt in the am. Message sent to Dr. Burr Medico . Pt's    Phone     [phone].

## 2016-01-11 ENCOUNTER — Encounter: Payer: Self-pay | Admitting: Radiation Oncology

## 2016-01-11 ENCOUNTER — Ambulatory Visit
Admission: RE | Admit: 2016-01-11 | Discharge: 2016-01-11 | Disposition: A | Discharge status: Home / Self Care | Payer: BLUE CROSS/BLUE SHIELD | Source: Ambulatory Visit | Attending: Radiation Oncology | Admitting: Radiation Oncology

## 2016-01-11 VITALS — BP 143/76 | HR 96 | Temp 98.2°F | Ht 66.0 in | Wt 221.3 lb

## 2016-01-11 DIAGNOSIS — C7801 Secondary malignant neoplasm of right lung: Secondary | ICD-10-CM

## 2016-01-11 DIAGNOSIS — C189 Malignant neoplasm of colon, unspecified: Secondary | ICD-10-CM | POA: Diagnosis not present

## 2016-01-11 DIAGNOSIS — C7800 Secondary malignant neoplasm of unspecified lung: Secondary | ICD-10-CM

## 2016-01-11 MED ORDER — HYDROCODONE-ACETAMINOPHEN 5-325 MG PO TABS: 1.0000 | ORAL_TABLET | ORAL | Status: DC | PRN

## 2016-01-11 NOTE — Progress Notes (Signed)
°  Radiation Oncology         [phone] ________________________________  Name: Bill Wright MRN: [place]:[id number]  Date: 12/05/2015  DOB: September 29, 1960  End of Treatment Note  Diagnosis:   Colon cancer metastasized to lung [facility]) s/p laparoscopic assisted sigmoid colectomy 11/02/15   Staging form: Colon and Rectum, AJCC 7th Edition     Clinical stage from 03/30/2015: Stage Unknown ([place], N1, M1) - Unsigned     Indication for treatment:  Palliative      Radiation treatment dates: 11/29/15, 12/01/15, 12/05/15  Site/dose:  54 Gy in 3 fractions to the left lung and 54 Gy in 3 fractions to the right lung  Beams/energy:  Left Lung: SBRT/SRT-3D // 6FFF Photon       Right Lung: SBRT/SRT-VMAT // 6FFF Photon  Narrative: The patient tolerated radiation treatment relatively well.  Plan: The patient has completed radiation treatment. The patient will return to radiation oncology clinic for routine followup in one month. I advised them to call or return sooner if they have any questions or concerns related to their recovery or treatment.  ------------------------------------------------  Jodelle Gross, MD, PhD  This document serves as a record of services personally performed by Kyung Rudd, MD. It was created on his behalf by Darcus Austin, a trained medical scribe. The creation of this record is based on the scribe's personal observations and the provider's statements to them. This document has been checked and approved by the attending provider.

## 2016-01-11 NOTE — Addendum Note (Signed)
Encounter addended by: Benn Moulder, RN on: 01/11/2016  5:06 PM<BR>     Documentation filed: Charges VN

## 2016-01-11 NOTE — Progress Notes (Addendum)
Mr. Dayal presents today with a continued dry cough for the past month.  He denies any fever nor any other associated symptoms.  He denies any abdominal pain, nor any diarrhea and reports that he is not as fatigued.   No longer receiving chemotherapy nor immmunotherapy. Next CT of chest in August.

## 2016-01-11 NOTE — Progress Notes (Addendum)
Radiation Oncology         [phone]) [phone] ________________________________  Name: Bill Wright MRN: [id number]  Date: 01/11/2016  DOB: [birth year]  Follow-Up Visit Note  CC: Lujean Amel, MD  Jackolyn Confer, MD  Diagnosis:      ICD-9-CM ICD-10-CM   1. Colon cancer metastasized to lung [facility]) s/p laparoscopic assisted sigmoid colectomy 11/02/15 153.9 C18.9    197.0 C78.00   2. Malignant neoplasm metastatic to right lung (H[facility]) 197.0 C78.01      Interval Since Last Radiation:  1 month  11/29/15-12/05/15: SBRT to the Right upper lobe  and to the Left left upper lobe.  Narrative:  The patient returns today for follow-up, and was last seen on 12/15/2015. At that time he complained of a dry cough and was encouraged to use Robitussin. The patient states that he is continuing to experience this dry cough since his last treatment. He states that the Robitussin does help him, but he does not have any fevers, productive cough, or shortness of breath. He states that on occasion he notices some tightness in his chest after coughing. He denies any progressive symptoms and reports that his symptoms usually begin at the beginning of the day. He denies any postnasal drip, sinus pressure or congestion. He denies any abdominal pain, nausea, or vomiting. He has plans to undergo a repeat CT scan on 02/09/2016.  ALLERGIES:  has No Known Allergies.  Meds: Current Outpatient Prescriptions  Medication Sig Dispense Refill  . clindamycin (CLINDAGEL) 1 % gel Apply topically 2 (two) times daily. 30 g 5  . guaifenesin (ROBITUSSIN) 100 MG/5ML syrup Take 200 mg by mouth 3 (three) times daily as needed for cough.    . hydrocortisone 2.5 % lotion APPLY EXTERNALLY TO THE AFFECTED AREA TWICE DAILY 200 mL 0  . ibuprofen (ADVIL,MOTRIN) 200 MG tablet Take 600 mg by mouth every 6 (six) hours as needed.    Marland Kitchen HYDROcodone-acetaminophen (NORCO/VICODIN) 5-325 MG tablet Take 1 tablet by mouth every 4 (four) hours as needed. Reported  on 01/11/2016  0   No current facility-administered medications for this encounter.    Physical Findings:  height is 5\' 6"  (1.676 m) and weight is 221 lb 4.8 oz (100.381 kg). His temperature is 98.2 F (36.8 C). His blood pressure is 143/76 and his pulse is 96.   In general this is a well appearing Caucasian male in no acute distress. He is alert and oriented x4 and appropriate throughout the examination. HEENT reveals that the patient is normocephalic, atraumatic. EOMs are intact. PERRLA. Skin is intact without any evidence of gross lesions. Cardiovascular exam reveals a regular rate and rhythm, no clicks rubs or murmurs are auscultated. Chest is clear to auscultation bilaterally. Lymphatic assessment is performed and does not reveal any adenopathy in the cervical or supraclavicular chains.  Lower extremities are negative for pretibial pitting edema, cyanosis or clubbing.     Lab Findings: Lab Results  Component Value Date   WBC 5.0 12/15/2015   HGB 13.6 12/15/2015   HCT 40.2 12/15/2015   MCV 86.6 12/15/2015   PLT 172 12/15/2015     Radiographic Findings: No results found.  Impression/Plan: 1. Metastatic colon cancer to lung. The patient has an MRI scheduled for 02/09/16. We will follow up with the results of this and review in about 3 months time. He is encouraged to call if he has questions or concerns regarding his previous treatment. 2. Dry cough. The patient's symptoms were reviewed in detail and he does  not have the hallmark symptoms of fever, productive cough, or shortness of breath that would go along with a pneumonitis diagnosis. His examination is also negative for consolidation, coarse breath sounds, or wheezing. I discussed with the patient my suspicion is that he may have GERD that is contributing to his dry cough without obvious "heartburn" or indigestion. We discussed the use of OTC PPIs and he is interested in trying Nexium OTC. I encouraged him to start 20 mg daily for at  least 2 weeks, and that I would prefer he continue this for about 3 weeks before we touch base with him.      Carola Rhine, PAC

## 2016-01-29 ENCOUNTER — Telehealth: Payer: Self-pay | Admitting: *Deleted

## 2016-01-29 NOTE — Telephone Encounter (Signed)
Called Bill Wright.  He reports  a decrease in coughing episodes, but still has an "irritant cough in the treatment area".  He states that he use to go through 2 bottles of cough medicine in a week, but much less usage at this time.  He reports no sputum production at this time. "I am slightly better".  Reports that PPI caused nausea and he is no longer using at this time. States he has a CT scan scheduled for 02/09/16 and does not need to be seen at this time.  Requested he call if his cough intensifies and he stated agreement.

## 2016-01-29 NOTE — Telephone Encounter (Signed)
-----   Message from Hayden Pedro, PA-C sent at 01/29/2016  2:56 PM EDT ----- Regarding: FW: 01/30/16 Devonne Lalani, Can you call and see how he's doing and if he's still having a cough? If it's better I would continue the current PPI therapy- I think he was going to use omeprazole. If it's not better can you find out details about his symptoms frequency, sputum etc? Thanks, Bryson Ha  ----- Message ----- From: Hayden Pedro, PA-C Sent: 01/16/2016   7:57 AM To: Hayden Pedro, PA-C Subject: 01/30/16                                       Check on cough status with PPIs on board

## 2016-02-01 ENCOUNTER — Telehealth: Payer: Self-pay | Admitting: *Deleted

## 2016-02-01 NOTE — Telephone Encounter (Signed)
Pt called and left message requesting a letter from Dr. Burr Medico to extend his short term disability from work. Pt's   Phone      [phone].

## 2016-02-05 ENCOUNTER — Encounter: Payer: Self-pay | Admitting: Hematology

## 2016-02-05 NOTE — Progress Notes (Signed)
Sent mess to dr. Burr Medico and thu to see if letter was done for patient. See prev notes. I have [employer] letter, asking for labs/notes. I will fax if no letter was done for him.

## 2016-02-09 ENCOUNTER — Encounter (HOSPITAL_COMMUNITY): Payer: Self-pay

## 2016-02-09 ENCOUNTER — Ambulatory Visit (HOSPITAL_COMMUNITY)
Admission: RE | Admit: 2016-02-09 | Discharge: 2016-02-09 | Disposition: A | Discharge status: Home / Self Care | Payer: BLUE CROSS/BLUE SHIELD | Source: Ambulatory Visit | Attending: Hematology | Admitting: Hematology

## 2016-02-09 DIAGNOSIS — N200 Calculus of kidney: Secondary | ICD-10-CM | POA: Insufficient documentation

## 2016-02-09 DIAGNOSIS — K7689 Other specified diseases of liver: Secondary | ICD-10-CM | POA: Diagnosis not present

## 2016-02-09 DIAGNOSIS — C189 Malignant neoplasm of colon, unspecified: Secondary | ICD-10-CM

## 2016-02-09 DIAGNOSIS — C7800 Secondary malignant neoplasm of unspecified lung: Secondary | ICD-10-CM | POA: Diagnosis present

## 2016-02-09 DIAGNOSIS — R911 Solitary pulmonary nodule: Secondary | ICD-10-CM | POA: Insufficient documentation

## 2016-02-09 MED ORDER — IOPAMIDOL (ISOVUE-300) INJECTION 61%
100.0000 mL | Freq: Once | INTRAVENOUS | Status: AC | PRN
  Administered 2016-02-09: 100 mL via INTRAVENOUS

## 2016-02-13 ENCOUNTER — Encounter: Payer: Self-pay | Admitting: Hematology

## 2016-02-13 NOTE — Progress Notes (Signed)
I faxed notes to [employer] [phone]

## 2016-02-16 ENCOUNTER — Encounter: Payer: Self-pay | Admitting: Hematology

## 2016-02-16 ENCOUNTER — Other Ambulatory Visit (HOSPITAL_BASED_OUTPATIENT_CLINIC_OR_DEPARTMENT_OTHER): Payer: BLUE CROSS/BLUE SHIELD

## 2016-02-16 ENCOUNTER — Ambulatory Visit (HOSPITAL_BASED_OUTPATIENT_CLINIC_OR_DEPARTMENT_OTHER): Payer: BLUE CROSS/BLUE SHIELD | Admitting: Hematology

## 2016-02-16 VITALS — BP 134/83 | HR 106 | Temp 98.7°F | Resp 18 | Ht 66.0 in | Wt 217.9 lb

## 2016-02-16 DIAGNOSIS — C189 Malignant neoplasm of colon, unspecified: Secondary | ICD-10-CM

## 2016-02-16 DIAGNOSIS — C7800 Secondary malignant neoplasm of unspecified lung: Secondary | ICD-10-CM

## 2016-02-16 DIAGNOSIS — R109 Unspecified abdominal pain: Secondary | ICD-10-CM

## 2016-02-16 DIAGNOSIS — R05 Cough: Secondary | ICD-10-CM | POA: Diagnosis not present

## 2016-02-16 DIAGNOSIS — C187 Malignant neoplasm of sigmoid colon: Secondary | ICD-10-CM

## 2016-02-16 DIAGNOSIS — M62838 Other muscle spasm: Secondary | ICD-10-CM

## 2016-02-16 LAB — COMPREHENSIVE METABOLIC PANEL
ALT: 23 U/L (ref 0–55)
ANION GAP: 11 meq/L (ref 3–11)
AST: 19 U/L (ref 5–34)
Albumin: 3.5 g/dL (ref 3.5–5.0)
Alkaline Phosphatase: 97 U/L (ref 40–150)
BILIRUBIN TOTAL: 0.41 mg/dL (ref 0.20–1.20)
BUN: 11.5 mg/dL (ref 7.0–26.0)
CHLORIDE: 106 meq/L (ref 98–109)
CO2: 24 meq/L (ref 22–29)
Calcium: 9.7 mg/dL (ref 8.4–10.4)
Creatinine: 0.8 mg/dL (ref 0.7–1.3)
GLUCOSE: 105 mg/dL (ref 70–140)
POTASSIUM: 4 meq/L (ref 3.5–5.1)
SODIUM: 140 meq/L (ref 136–145)
TOTAL PROTEIN: 8 g/dL (ref 6.4–8.3)

## 2016-02-16 LAB — CBC WITH DIFFERENTIAL/PLATELET
BASO%: 0.4 % (ref 0.0–2.0)
BASOS ABS: 0 10*3/uL (ref 0.0–0.1)
EOS ABS: 0.1 10*3/uL (ref 0.0–0.5)
EOS%: 1.3 % (ref 0.0–7.0)
HCT: 39.5 % (ref 38.4–49.9)
HEMOGLOBIN: 13.2 g/dL (ref 13.0–17.1)
LYMPH%: 12.7 % — AB (ref 14.0–49.0)
MCH: 28.6 pg (ref 27.2–33.4)
MCHC: 33.3 g/dL (ref 32.0–36.0)
MCV: 86.1 fL (ref 79.3–98.0)
MONO#: 0.7 10*3/uL (ref 0.1–0.9)
MONO%: 8.6 % (ref 0.0–14.0)
NEUT#: 6.2 10*3/uL (ref 1.5–6.5)
NEUT%: 77 % — ABNORMAL HIGH (ref 39.0–75.0)
Platelets: 257 10*3/uL (ref 140–400)
RBC: 4.59 10*6/uL (ref 4.20–5.82)
RDW: 13.8 % (ref 11.0–14.6)
WBC: 8 10*3/uL (ref 4.0–10.3)
lymph#: 1 10*3/uL (ref 0.9–3.3)

## 2016-02-16 LAB — CEA (IN HOUSE-CHCC)

## 2016-02-16 MED ORDER — HYDROCODONE-HOMATROPINE 5-1.5 MG/5ML PO SYRP: 5.0000 mL | ORAL_SOLUTION | Freq: Four times a day (QID) | ORAL | 0 refills | Status: DC | PRN

## 2016-02-16 MED ORDER — HYDROCODONE-ACETAMINOPHEN 5-325 MG PO TABS: 1.0000 | ORAL_TABLET | ORAL | 0 refills | Status: DC | PRN

## 2016-02-16 NOTE — Progress Notes (Signed)
Bill Wright  [phone] Fax:([phone]) [phone]  Clinic Follow Up Note   Patient Care Team: Bill Koirala, MD as PCP - General (Family Medicine) Bill Corner, MD as Consulting Physician (Gastroenterology) Bill Confer, MD as Consulting Physician (General Surgery) Bill Merle, MD as Consulting Physician (Hematology) 02/16/2016  CHIEF COMPLAINTS:  Follow Up colon cancer  Oncology History   T2, Colon cancer metastasized to lung [facility])   Staging form: Colon and Rectum, AJCC 7th Edition     Clinical stage from 03/30/2015: Stage Unknown ([place], N1, M1) - Unsigned       Colon cancer metastasized to lung [facility]) s/p laparoscopic assisted sigmoid colectomy 11/02/15   03/30/2015 Miscellaneous    Foundation one genomic testing showed TP53 mutation, MSI stable, low tumor mutation burden. Negative for K-ras, NRAS and BRAF     03/30/2015 Initial Biopsy    Sigmoid mass biopsy showed invasive adenocarcinoma. Cecal colon polyps showed tubular adenoma.     03/30/2015 Initial Diagnosis    Colon cancer     03/30/2015 Procedure    colonoscopy by Dr. Michail Wright showed a fungating, infiltrative and ulcerated nonobstructing large mass in the sigmoid colon and at 20 cm proximal to the anus. The mass was partially circumferential no bleeding. A 10 mm polyps in the cecum was removed.     04/03/2015 Imaging    CT chest, abdomen and pelvis with contrast showed nodular masslike area of clinical worsening at rectosigmoid junction, tiny pericolonic lymph nodes, bilateral pulmonary nodules measuring about 1 cm.     04/12/2015 PET scan    Hypermetabolic colonic mass near rectosigmoid junction, tiny subcentimeter paracolonic lymph nodes. Bilateral pulmonary metastasis.     04/19/2015 Procedure    CT-guided lung nodule biopsy attempted, unsuccessful.     04/27/2015 - 09/14/2015 Chemotherapy    Oxaliplatin 130 mg/m on day 1, Capecitabine '2300mg'$  q12hr, 2 weeks on and one week off (only received 7  days for first cycle), oxaliplatin held on cycle 5 and dose reduced to '100mg'$ /m2, capecitabine reduced to '2000mg'$  q12h D1-14, stopped per pt's request.      06/29/2015 - 10/19/2015 Chemotherapy     Panitumumab every 2 weeks, some cycles were postponed due to pt's request, stoppe per pt's request      09/18/2015 Imaging    Pulmonary nodules are less hypermetabolic, stable size. Stable hypermetabolic portal hepatis and abdominal peritoneal ligament lymph nodes. No other new lesions.      11/02/2015 Surgery    sigmoid colon segmental resection      11/02/2015 Pathology Results    Sigmoid colon segmental resection showed adenocarcinoma, grade 3,  T2, 3 out of 13 lymph nodes were positive, surgical margins were negative. LVI(-), perineural invasion negative     11/13/2015 Imaging    CT chest, abdomen and pelvis with contrast showed postsurgical changes, mild progression of pulmonary metastasis, measuring up to 15 mm in the right lower lobe.     11/29/2015 - 12/05/2015 Radiation Therapy    SBRT to 4 lung lesions in 3 sessions       HISTORY OF PRESENTING ILLNESS:  Bill Wright 55 y.o. male is here because of recently newly diagnosed colon cancer.  He has had intermittent bloody stool for 2 years, it has been mild, mixed with stool, patient does not have any abdominal pain, constipation, change of his bowel habits, nausea, weight loss or other symptoms. He did not seek medical attention for this. He went to emergency room on 03/15/2015 for right flank pain,  due to his kidney stone. CT scan incidentally found a 11 mm right lower lobe nodule and mucosal edema in the sigmoid colon. He saw his primary care physician, and was referred to GI Bill Wright here at he underwent colonoscopy on 03/30/2015, which showed a fungating infiltrative and ulcerated nonobstructing large mass in the sigmoid colon, biopsy showed adenocarcinoma. CT chest abdomen and pelvis showed multiple lung nodules measuring about 1 cm. He was  referred to surgeon Dr. Abbey Wright, who referred patient to [place] for further workup of his lung nodule and discuss chemotherapy.  He feels very well overall, denies any symptoms. He is a Production designer, theatre/television/film at [employer], lives with his wife and 3 children. He never had screening colonoscopy prior the reason one, no significant past medical history, does not see doctors regularly.  CURRENT TREATMENT: observation  INTERIM HISTORY Bill Wright returns for follow-up. He complains of intermittent dry cough since his last SBR treatment. He has cough spells several times a day, sometime at night. His wife complains of being awake by his cough at night. He has not gone back to work yet due to his cough and mild fatigue. He otherwise feels well, no sputum production, no fever, no dyspnea. He denies any abdominal discomfort, his bowel movement is normal. His appetite and energy level are normal.  MEDICAL HISTORY:  Past Medical History:  Diagnosis Date  . Anxiety    situational due to cancer diagnosis  . Cancer [facility]) 2017   colon-chemo 09/22/15 now surgery  . Hypercholesteremia   . Kidney calculi     SURGICAL HISTORY: Past Surgical History:  Procedure Laterality Date  . COLONOSCOPY  03/30/15  . LAPAROSCOPIC PARTIAL COLECTOMY N/A 11/02/2015   Procedure: LAPAROSCOPIC ASSISTED SIGMOID COLECTOMY;  Surgeon: Avel Peace, MD;  Location: [facility]S;  Service: General;  Laterality: N/A;    SOCIAL HISTORY: Social History   Social History  . Marital Status: Married    Spouse Name: N/A  . Number of Children: 3, age of 61, 13 and 71   . Years of Education: N/A   Occupational History  . Banker for [employer] [place]    Social History Main Topics  . Smoking status: Never Smoker   . Smokeless tobacco: Not on file  . Alcohol Use: No  . Drug Use: No  . Sexual Activity: Not on file   Other Topics Concern  . Not on file   Social History Narrative    FAMILY HISTORY: Family History  Problem Relation Age of Onset    . Breast cancer Mother 68    +rad and lymph node  . Diabetes Maternal Grandmother     leading to blindness  . Obesity Maternal Aunt   . Stroke Maternal Uncle 51    ALLERGIES:  has No Known Allergies.  MEDICATIONS:  Current Outpatient Prescriptions  Medication Sig Dispense Refill  . clindamycin (CLINDAGEL) 1 % gel Apply topically 2 (two) times daily. 30 g 5  . guaifenesin (ROBITUSSIN) 100 MG/5ML syrup Take 200 mg by mouth 3 (three) times daily as needed for cough.    Marland Kitchen HYDROcodone-acetaminophen (NORCO/VICODIN) 5-325 MG tablet Take 1 tablet by mouth every 4 (four) hours as needed. Reported on 01/11/2016 30 tablet 0  . hydrocortisone 2.5 % lotion APPLY EXTERNALLY TO THE AFFECTED AREA TWICE DAILY 200 mL 0  . ibuprofen (ADVIL,MOTRIN) 200 MG tablet Take 600 mg by mouth every 6 (six) hours as needed.    Marland Kitchen HYDROcodone-homatropine (HYCODAN) 5-1.5 MG/5ML syrup Take 5 mLs by  mouth every 6 (six) hours as needed for cough. 120 mL 0   No current facility-administered medications for this visit.     REVIEW OF SYSTEMS:   Constitutional: Denies fevers, chills or abnormal night sweats, no weight loss. Eyes: Denies blurriness of vision, double vision or watery eyes Ears, nose, mouth, throat, and face: Denies mucositis or sore throat Respiratory: Denies cough, dyspnea or wheezes Cardiovascular: Denies palpitation, chest discomfort or lower extremity swelling Gastrointestinal:  Denies nausea, heartburn or change in bowel habits Skin: Denies abnormal skin rashes Lymphatics: Denies new lymphadenopathy or easy bruising Neurological:Denies numbness, tingling or new weaknesses Behavioral/Psych: Mood is stable, no new changes  All other systems were reviewed with the patient and are negative.  PHYSICAL EXAMINATION: ECOG PERFORMANCE STATUS: 1 BP 134/83 (BP Location: Left Arm, Patient Position: Sitting)   Pulse (!) 106 Comment: made the nurse Katie aware  Temp 98.7 F (37.1 C) (Oral)   Resp 18   Ht 5'  6" (1.676 m)   Wt 217 lb 14.4 oz (98.8 kg)   SpO2 98%   BMI 35.17 kg/m  GENERAL:alert, no distress and comfortable SKIN: skin color, texture, turgor are normal, diffuse acne-like rash on his front chest and back, scattered rash on his neck and face, there is a healing large boil above his upper lip, no discharge  EYES: normal, conjunctiva are pink and non-injected, sclera clear OROPHARYNX:no exudate, no erythema and lips, buccal mucosa, and tongue normal  NECK: supple, thyroid normal size, non-tender, without nodularity LYMPH:  no palpable lymphadenopathy in the cervical, axillary or inguinal LUNGS: clear to auscultation and percussion with normal breathing effort HEART: regular rate & rhythm and no murmurs and no lower extremity edema ABDOMEN:abdomen soft, non-tender and normal bowel sounds. The midline incision has healed well, no discharge or skin erythema  Musculoskeletal:no cyanosis of digits and no clubbing  PSYCH: alert & oriented x 3 with fluent speech NEURO: no focal motor/sensory deficits  LABORATORY DATA:  I have reviewed the data as listed CBC Latest Ref Rng & Units 02/16/2016 12/15/2015 11/15/2015  WBC 4.0 - 10.3 10e3/uL 8.0 5.0 5.8  Hemoglobin 13.0 - 17.1 g/dL 13.2 13.6 13.0  Hematocrit 38.4 - 49.9 % 39.5 40.2 38.9  Platelets 140 - 400 10e3/uL 257 172 299   CMP Latest Ref Rng & Units 02/16/2016 12/15/2015 11/15/2015  Glucose 70 - 140 mg/dl 105 93 110  BUN 7.0 - 26.0 mg/dL 11.5 13.5 8.3  Creatinine 0.7 - 1.3 mg/dL 0.8 0.8 0.8  Sodium 136 - 145 mEq/L 140 138 140  Potassium 3.5 - 5.1 mEq/L 4.0 4.0 3.6  Chloride 101 - 111 mmol/L - - -  CO2 22 - 29 mEq/L _0 Calcium 8.4 - 10.4 mg/dL 9.7 9.5 9.2  Total Protein 6.4 - 8.3 g/dL 8.0 7.7 7.3  Total Bilirubin 0.20 - 1.20 mg/dL 0.41 0.43 0.46  Alkaline Phos 40 - 150 U/L 97 95 75  AST 5 - 34 U/L _1 ALT 0 - 55 U/L 23 31 47    PATHOLOGY REPORT  Diagnosis 11/02/2015 1. Colon, segmental resection for tumor,  sigmoid ADENOCARCINOMA OF THE SIGMOID COLON (2.0 CM), GRADE 3 THE TUMOR INVADES MUSCULARIS PROPRIA MARGINS OF RESECTION ARE NEGATIVE METASTATIC ADENOCARCINOMA IN THREE OF THIRTEEN LYMPH NODES (3/13) 2. Colon, resection margin (donut), distal sigmoid BENIGN COLONIC TISSUE Microscopic Comment 1. COLON AND RECTUM (INCLUDING TRANS-ANAL RESECTION): Specimen: Sigmoid Procedure: Segmental resection Tumor site: sigmoid Specimen integrity: Intact Macroscopic intactness of mesorectum: Not  applicable: x Complete: NA Near complete: NA Incomplete: NA Cannot be determined (specify): NA Macroscopic tumor perforation: Muscularis Invasive tumor: Maximum size: 2.0 cm Histologic type(s): Adenocarcinoma Histologic grade and differentiation: G3 G1: well differentiated/low grade G2: moderately differentiated/low grade G3: poorly differentiated/high grade G4: undifferentiated/high grade Type of polyp in which invasive carcinoma arose: Tubular adenoma Microscopic extension of invasive tumor: Muscularis propria Lymph-Vascular invasion: Negative Peri-neural invasion: Negative Tumor deposit(s) (discontinuous extramural extension): Negative Resection margins: Proximal margin: Negative Distal margin: Negative Circumferential (radial) (posterior ascending, posterior descending; lateral and posterior mid-rectum; and entire lower 1/3 rectum):Negative Mesenteric margin (sigmoid and transverse): Negative Distance closest margin (if all above margins negative): 3.5 cm Trans-anal resection margins only: Deep margin: NA Mucosal Margin: NA Distance closest mucosal margin (if negative): NA Treatment effect (neo-adjuvant therapy): Partial Additional polyp(s): None Non-neoplastic findings: unremarkable Lymph nodes: number examined 13; number positive: 3 Pathologic Staging: T2, N1b, M1a     RADIOGRAPHIC STUDIES: I have personally reviewed the radiological images as listed and agreed with the findings in  the report.  PET 04/12/2015 IMPRESSION: Hypermetabolic colonic mass near rectosigmoid junction, consistent with known primary colon carcinoma.  Tiny sub-cm pericolonic lymph nodes in sigmoid mesocolon are too small to characterize by PET, but are suspicious for early lymph node metastases.  Bilateral pulmonary metastases  PET 09/18/2015 IMPRESSION: 1. Hypermetabolic pulmonary metastases have enlarged slightly from 07/18/2015. 2. Hypermetabolic porta hepatis/abdominal peritoneal ligament lymph nodes, stable from 04/12/2015. 3. Increase in hypermetabolism associated with mesenteric haziness and nodularity. While a reactive phenomenon/panniculitis can create this in appearance, metastatic disease/lymphoproliferative disorder cannot be excluded. 4. Hepatic steatosis. 5. Bilateral nephrolithiasis.  CT chest, abdomen and pelvis with contrast 02/09/2016 IMPRESSION: 1. Pulmonary nodules that appear to be within the radiation fields appear slightly smaller on today's study. The patient does have nodules liver progressed in the interval including a 6 mm posterior left upper lobe nodule and an 11 mm nodule in the posterior medial aspect of the right lower lobe. 2. No evidence for metastatic disease in the abdomen or pelvis. 3. Nonobstructing right renal stone. 4. Edema and stranding around the colonic anastomosis is decreased in the interval.   ASSESSMENT & PLAN:  55 year old male, without significant past medical history except kidney stone, presented with intermittent bloody stool for 2 years, and colonoscopy showed a large sigmoid colon mass, CT scan showed multiple (at least 4) nodules in bilateral lungs, measuring about 1 cm.  1. Sigmoid colon adenocarcinoma, TxN1M1, probably stage IV with lung mets  -I previously reviewed his colonoscopy, CT scan findings and the biopsy results in great details with patient and his wife. -I reviewed his surgical pathology findings, which showed  a residual T2 primary tumor, 3 lymph nodes positive, grade 3 disease, certainly high risk disease.  -We discussed that his disease is likely incurable, and the goal of therapy is maximum disease control and prolong his life.  -- His tumor does not contain KRAS/NRAS or BRAF mutation, so he would benefit from EGFR antibody, Panitumumab was added on from cycle 4. He received a total of 4 month of chemotherapy which was held per patient's request.He declined more chemo after his colon surgery.  --I personally reviewed his CT scan image from  02/09/2016 with him, his previous radiated on nodules have gotten smaller, however he has a few small lung nodules which has slightly enlarged compared to last CT scan in May.  I think those are likely new   Pulmonary metastasis. However is here very small, difficult  to biopsy. I do not think PET scan will be helpful also.   -I discussed the option of starting Xeloda and panitumumab, vs close foll repeated CT chest in 2 months, and start treatment at that time if he has further disease progression.  -He prefers close follow-up for now.  -I'll discuss his CT scan findings with radiation oncologist Dr. Lisbeth Renshaw also to get his input.  -I'll repeat a CT chest without contrast in seven weeks.   2. Dry cough - Most likely related to his SBRT, he does have mild infiltrative change in the area of previous radiation, especially in the right lower lobe.  -I'll discuss with Dr. Lisbeth Renshaw to see if you would benefit from tapering dose of prednisone  -I give him a prescription of Hycodan today   3. Body pain -He complains of muscle spasm on bilateral chest, and abdomen. He also has right flank pain lately in findings on the ct scan to explain his pain, probable muscular pain. -He will continue hydrocodone as needed   Plan -I'll discuss with Dr. Lisbeth Renshaw about his CT scan findings, and the role of prednisone for his cough -I give him prescription of Hycodan, and reviewed his vicodin   -Repeat CT chest without contrast in 7 weeks, I'll see him back in 8 weeks  I spent 25 minutes counseling the patient face to face. The total time spent in the appointment was 30 minutes and more than 50% was on counseling.     Bill Merle, MD 02/16/2016

## 2016-02-17 LAB — CEA: CEA: 0.9 ng/mL (ref 0.0–4.7)

## 2016-02-20 ENCOUNTER — Telehealth: Payer: Self-pay | Admitting: Hematology

## 2016-02-20 NOTE — Telephone Encounter (Signed)
Appointment conf with pt.

## 2016-02-21 ENCOUNTER — Telehealth: Payer: Self-pay | Admitting: *Deleted

## 2016-02-21 ENCOUNTER — Telehealth: Payer: Self-pay | Admitting: Radiation Oncology

## 2016-02-21 ENCOUNTER — Other Ambulatory Visit: Payer: Self-pay | Admitting: Radiation Oncology

## 2016-02-21 MED ORDER — PREDNISONE 20 MG PO TABS: 60.0000 mg | ORAL_TABLET | Freq: Every day | ORAL | 0 refills | Status: DC

## 2016-02-21 NOTE — Telephone Encounter (Signed)
error 

## 2016-02-21 NOTE — Telephone Encounter (Signed)
Error rx called to [facility] for rx prednisone taper by [employer] RN

## 2016-02-21 NOTE — Telephone Encounter (Addendum)
I reviewed that the pt should have repeat interval scan and that we would recommend proceeding with prednisone as well and empirically treat as though this cough is due to pneumonitis. Instructions were detailed with the patient including side effect profile of the drug. He will call back if his symptoms haven't improved in the next week.

## 2016-02-27 ENCOUNTER — Telehealth: Payer: Self-pay | Admitting: Hematology

## 2016-02-27 NOTE — Telephone Encounter (Signed)
Pt walked in to have disablility records faxed to [place]. FU:[id number] release id)

## 2016-03-01 ENCOUNTER — Telehealth: Payer: Self-pay | Admitting: *Deleted

## 2016-03-01 ENCOUNTER — Other Ambulatory Visit: Payer: Self-pay | Admitting: *Deleted

## 2016-03-01 DIAGNOSIS — C189 Malignant neoplasm of colon, unspecified: Secondary | ICD-10-CM

## 2016-03-01 MED ORDER — HYDROCODONE-HOMATROPINE 5-1.5 MG/5ML PO SYRP: 5.0000 mL | ORAL_SOLUTION | Freq: Four times a day (QID) | ORAL | 0 refills | Status: DC | PRN

## 2016-03-01 MED ORDER — HYDROCODONE-ACETAMINOPHEN 5-325 MG PO TABS: 1.0000 | ORAL_TABLET | ORAL | 0 refills | Status: DC | PRN

## 2016-03-01 NOTE — Telephone Encounter (Addendum)
Received call from pt stating that he needs refills on his hydrocodone & hydromet & req call back when ready.  Message to Dr Feng/Pod 1.   Talked with pt again & he states that he has a few left of his hydrocodone & is out of his hydromet.  He reports taking these meds due to irritated lungs & is trying to get ready to go back to work & needs refills. He reports taking the hydromet at hs & sometimes twice daily.  He has had to take the hydrocodone with the steroids sometimes every 4 hours.

## 2016-03-01 NOTE — Telephone Encounter (Signed)
Spoke with pt and informed pt that scripts ready for pt to pick up today before 4 pm.

## 2016-03-15 ENCOUNTER — Telehealth: Payer: Self-pay | Admitting: *Deleted

## 2016-03-15 NOTE — Telephone Encounter (Signed)
TC from [employer], Case Freight forwarder for pt's [employer] requesting update on pt's plan of care.. Advised her that pt is to have CT Scan in a few weeks and then f/u with Dr. Burr Medico on 04/12/16.

## 2016-03-25 ENCOUNTER — Telehealth: Payer: Self-pay

## 2016-03-25 NOTE — Telephone Encounter (Signed)
[facility] office was looking at 1 year ago pt's to see if they needed f/u. Dr Michail Sermon is not going to recommend repeat colonoscopy unless Dr Burr Medico feels the pt needs one done. Dr Michail Sermon office number is [phone].

## 2016-03-25 NOTE — Telephone Encounter (Signed)
Please let the caller know that OK to hold on the colonoscopy for now, I will discuss with pt when I see him back next time. Thanks.   Truitt Merle MD

## 2016-04-05 ENCOUNTER — Ambulatory Visit (HOSPITAL_COMMUNITY)
Admission: RE | Admit: 2016-04-05 | Discharge: 2016-04-05 | Disposition: A | Discharge status: Home / Self Care | Payer: BLUE CROSS/BLUE SHIELD | Source: Ambulatory Visit | Attending: Hematology | Admitting: Hematology

## 2016-04-05 DIAGNOSIS — R918 Other nonspecific abnormal finding of lung field: Secondary | ICD-10-CM | POA: Insufficient documentation

## 2016-04-05 DIAGNOSIS — C7800 Secondary malignant neoplasm of unspecified lung: Secondary | ICD-10-CM

## 2016-04-05 DIAGNOSIS — Z08 Encounter for follow-up examination after completed treatment for malignant neoplasm: Secondary | ICD-10-CM | POA: Diagnosis not present

## 2016-04-05 DIAGNOSIS — Z85038 Personal history of other malignant neoplasm of large intestine: Secondary | ICD-10-CM | POA: Insufficient documentation

## 2016-04-05 DIAGNOSIS — Z85118 Personal history of other malignant neoplasm of bronchus and lung: Secondary | ICD-10-CM | POA: Insufficient documentation

## 2016-04-05 DIAGNOSIS — C189 Malignant neoplasm of colon, unspecified: Secondary | ICD-10-CM

## 2016-04-05 DIAGNOSIS — I2510 Atherosclerotic heart disease of native coronary artery without angina pectoris: Secondary | ICD-10-CM | POA: Insufficient documentation

## 2016-04-12 ENCOUNTER — Telehealth: Payer: Self-pay | Admitting: Hematology

## 2016-04-12 ENCOUNTER — Ambulatory Visit (HOSPITAL_BASED_OUTPATIENT_CLINIC_OR_DEPARTMENT_OTHER): Payer: BLUE CROSS/BLUE SHIELD | Admitting: Hematology

## 2016-04-12 ENCOUNTER — Encounter: Payer: Self-pay | Admitting: Hematology

## 2016-04-12 VITALS — BP 146/84 | HR 106 | Temp 98.5°F | Resp 17 | Ht 66.0 in | Wt 211.9 lb

## 2016-04-12 DIAGNOSIS — Z23 Encounter for immunization: Secondary | ICD-10-CM

## 2016-04-12 DIAGNOSIS — E669 Obesity, unspecified: Secondary | ICD-10-CM

## 2016-04-12 DIAGNOSIS — R05 Cough: Secondary | ICD-10-CM

## 2016-04-12 DIAGNOSIS — C7800 Secondary malignant neoplasm of unspecified lung: Secondary | ICD-10-CM

## 2016-04-12 DIAGNOSIS — C189 Malignant neoplasm of colon, unspecified: Secondary | ICD-10-CM

## 2016-04-12 DIAGNOSIS — C187 Malignant neoplasm of sigmoid colon: Secondary | ICD-10-CM | POA: Diagnosis not present

## 2016-04-12 MED ORDER — HYDROCODONE-ACETAMINOPHEN 5-325 MG PO TABS: 1.0000 | ORAL_TABLET | ORAL | 0 refills | Status: DC | PRN

## 2016-04-12 MED ORDER — HYDROCODONE-HOMATROPINE 5-1.5 MG/5ML PO SYRP: 5.0000 mL | ORAL_SOLUTION | Freq: Four times a day (QID) | ORAL | 0 refills | Status: DC | PRN

## 2016-04-12 MED ORDER — INFLUENZA VAC SPLIT QUAD 0.5 ML IM SUSY
0.5000 mL | PREFILLED_SYRINGE | Freq: Once | INTRAMUSCULAR | Status: AC
  Administered 2016-04-12: 0.5 mL via INTRAMUSCULAR
  Filled 2016-04-12: qty 0.5

## 2016-04-12 NOTE — Progress Notes (Signed)
[facility]  [phone] [phone]  Clinic Follow Up Note   Patient Care Team: Dibas Koirala, MD as PCP - General (Family Medicine) Wilford Corner, MD as Consulting Physician (Gastroenterology) Jackolyn Confer, MD as Consulting Physician (General Surgery) Truitt Merle, MD as Consulting Physician (Hematology) 04/12/2016  CHIEF COMPLAINTS:  Follow Up colon cancer  Oncology History   T2, Colon cancer metastasized to lung [facility])   Staging form: Colon and Rectum, AJCC 7th Edition     Clinical stage from 03/30/2015: Stage Unknown ([place], N1, M1) - Unsigned       Colon cancer metastasized to lung [facility]) s/p laparoscopic assisted sigmoid colectomy 11/02/15   03/30/2015 Miscellaneous    Foundation one genomic testing showed TP53 mutation, MSI stable, low tumor mutation burden. Negative for K-ras, NRAS and BRAF      03/30/2015 Initial Biopsy    Sigmoid mass biopsy showed invasive adenocarcinoma. Cecal colon polyps showed tubular adenoma.      03/30/2015 Initial Diagnosis    Colon cancer      03/30/2015 Procedure    colonoscopy by Dr. Michail Sermon showed a fungating, infiltrative and ulcerated nonobstructing large mass in the sigmoid colon and at 20 cm proximal to the anus. The mass was partially circumferential no bleeding. A 10 mm polyps in the cecum was removed.      04/03/2015 Imaging    CT chest, abdomen and pelvis with contrast showed nodular masslike area of clinical worsening at rectosigmoid junction, tiny pericolonic lymph nodes, bilateral pulmonary nodules measuring about 1 cm.      04/12/2015 PET scan    Hypermetabolic colonic mass near rectosigmoid junction, tiny subcentimeter paracolonic lymph nodes. Bilateral pulmonary metastasis.      04/19/2015 Procedure    CT-guided lung nodule biopsy attempted, unsuccessful.      04/27/2015 - 09/14/2015 Chemotherapy    Oxaliplatin 130 mg/m on day 1, Capecitabine 2311m q12hr, 2 weeks on and one week off  (only received 7 days for first cycle), oxaliplatin held on cycle 5 and dose reduced to 1044mm2, capecitabine reduced to 200077m12h D1-14, stopped per pt's request.       06/29/2015 - 10/19/2015 Chemotherapy     Panitumumab every 2 weeks, some cycles were postponed due to pt's request, stoppe per pt's request       09/18/2015 Imaging    Pulmonary nodules are less hypermetabolic, stable size. Stable hypermetabolic portal hepatis and abdominal peritoneal ligament lymph nodes. No other new lesions.       11/02/2015 Surgery    sigmoid colon segmental resection       11/02/2015 Pathology Results    Sigmoid colon segmental resection showed adenocarcinoma, grade 3,  T2, 3 out of 13 lymph nodes were positive, surgical margins were negative. LVI(-), perineural invasion negative      11/13/2015 Imaging    CT chest, abdomen and pelvis with contrast showed postsurgical changes, mild progression of pulmonary metastasis, measuring up to 15 mm in the right lower lobe.      11/29/2015 - 12/05/2015 Radiation Therapy    SBRT to 4 lung lesions in 3 sessions        HISTORY OF PRESENTING ILLNESS:  Bill Wright 55o. male is here because of recently newly diagnosed colon cancer.  He has had intermittent bloody stool for 2 years, it has been mild, mixed with stool, patient does not have any abdominal pain, constipation, change of his bowel habits, nausea, weight loss or other symptoms. He did not seek medical  attention for this. He went to emergency room on 03/15/2015 for right flank pain, due to his kidney stone. CT scan incidentally found a 11 mm right lower lobe nodule and mucosal edema in the sigmoid colon. He saw his primary care physician, and was referred to GI Dr. Michail Sermon here at he underwent colonoscopy on 03/30/2015, which showed a fungating infiltrative and ulcerated nonobstructing large mass in the sigmoid colon, biopsy showed adenocarcinoma. CT chest abdomen and pelvis showed multiple lung  nodules measuring about 1 cm. He was referred to surgeon Dr. Zella Richer, who referred patient to [place] for further workup of his lung nodule and discuss chemotherapy.  He feels very well overall, denies any symptoms. He is a Freight forwarder at [employer], lives with his wife and 3 children. He never had screening colonoscopy prior the reason one, no significant past medical history, does not see doctors regularly.  CURRENT TREATMENT: observation  INTERIM HISTORY Bill Wright returns for follow-up. He is accompanied by his wife and daughter to the clinic today. He has persistent dry cough, which has not changed much since his is SBRT 4-5 months ago, his wife feels his cough is worse. He denies significant chest pain, dyspnea, or other symptoms. He has good appetite and energy level, he is back to work full-time.  MEDICAL HISTORY:  Past Medical History:  Diagnosis Date  . Anxiety    situational due to cancer diagnosis  . Cancer [facility]) 2017   colon-chemo 09/22/15 now surgery  . Hypercholesteremia   . Kidney calculi     SURGICAL HISTORY: Past Surgical History:  Procedure Laterality Date  . COLONOSCOPY  03/30/15  . LAPAROSCOPIC PARTIAL COLECTOMY N/A 11/02/2015   Procedure: LAPAROSCOPIC ASSISTED SIGMOID COLECTOMY;  Surgeon: Jackolyn Confer, MD;  Location: [facility]S;  Service: General;  Laterality: N/A;    SOCIAL HISTORY: Social History   Social History  . Marital Status: Married    Spouse Name: N/A  . Number of Children: 3, age of 49, 76 and 27   . Years of Education: N/A   Occupational History  . Banker for [employer] [place]    Social History Main Topics  . Smoking status: Never Smoker   . Smokeless tobacco: Not on file  . Alcohol Use: No  . Drug Use: No  . Sexual Activity: Not on file   Other Topics Concern  . Not on file   Social History Narrative    FAMILY HISTORY: Family History  Problem Relation Age of Onset  . Breast cancer Mother 23    +rad and lymph node  . Diabetes Maternal  Grandmother     leading to blindness  . Obesity Maternal Aunt   . Stroke Maternal Uncle 65    ALLERGIES:  has No Known Allergies.  MEDICATIONS:  Current Outpatient Prescriptions  Medication Sig Dispense Refill  . clindamycin (CLINDAGEL) 1 % gel Apply topically 2 (two) times daily. (Patient not taking: Reported on 04/12/2016) 30 g 5  . guaifenesin (ROBITUSSIN) 100 MG/5ML syrup Take 200 mg by mouth 3 (three) times daily as needed for cough.    Marland Kitchen HYDROcodone-acetaminophen (NORCO/VICODIN) 5-325 MG tablet Take 1 tablet by mouth every 4 (four) hours as needed. Reported on 01/11/2016 30 tablet 0  . HYDROcodone-homatropine (HYCODAN) 5-1.5 MG/5ML syrup Take 5 mLs by mouth every 6 (six) hours as needed for cough. 120 mL 0  . hydrocortisone 2.5 % lotion APPLY EXTERNALLY TO THE AFFECTED AREA TWICE DAILY (Patient not taking: Reported on 04/12/2016) 200 mL 0  .  ibuprofen (ADVIL,MOTRIN) 200 MG tablet Take 600 mg by mouth every 6 (six) hours as needed.     No current facility-administered medications for this visit.     REVIEW OF SYSTEMS:   Constitutional: Denies fevers, chills or abnormal night sweats, no weight loss. Eyes: Denies blurriness of vision, double vision or watery eyes Ears, nose, mouth, throat, and face: Denies mucositis or sore throat Respiratory: Denies cough, dyspnea or wheezes Cardiovascular: Denies palpitation, chest discomfort or lower extremity swelling Gastrointestinal:  Denies nausea, heartburn or change in bowel habits Skin: Denies abnormal skin rashes Lymphatics: Denies new lymphadenopathy or easy bruising Neurological:Denies numbness, tingling or new weaknesses Behavioral/Psych: Mood is stable, no new changes  All other systems were reviewed with the patient and are negative.  PHYSICAL EXAMINATION: ECOG PERFORMANCE STATUS: 1 BP (!) 146/84 (BP Location: Left Arm, Patient Position: Sitting)   Pulse (!) 106 Comment: told rn  Temp 98.5 F (36.9 C) (Oral)   Resp 17   Ht 5'  6" (1.676 m)   Wt 211 lb 14.4 oz (96.1 kg)   SpO2 98%   BMI 34.20 kg/m  GENERAL:alert, no distress and comfortable SKIN: skin color, texture, turgor are normal, diffuse acne-like rash on his front chest and back, scattered rash on his neck and face, there is a healing large boil above his upper lip, no discharge  EYES: normal, conjunctiva are pink and non-injected, sclera clear OROPHARYNX:no exudate, no erythema and lips, buccal mucosa, and tongue normal  NECK: supple, thyroid normal size, non-tender, without nodularity LYMPH:  no palpable lymphadenopathy in the cervical, axillary or inguinal LUNGS: clear to auscultation and percussion with normal breathing effort HEART: regular rate & rhythm and no murmurs and no lower extremity edema ABDOMEN:abdomen soft, non-tender and normal bowel sounds. The midline incision has healed well, no discharge or skin erythema  Musculoskeletal:no cyanosis of digits and no clubbing  PSYCH: alert & oriented x 3 with fluent speech NEURO: no focal motor/sensory deficits  LABORATORY DATA:  I have reviewed the data as listed CBC Latest Ref Rng & Units 02/16/2016 12/15/2015 11/15/2015  WBC 4.0 - 10.3 10e3/uL 8.0 5.0 5.8  Hemoglobin 13.0 - 17.1 g/dL 13.2 13.6 13.0  Hematocrit 38.4 - 49.9 % 39.5 40.2 38.9  Platelets 140 - 400 10e3/uL 257 172 299   CMP Latest Ref Rng & Units 02/16/2016 12/15/2015 11/15/2015  Glucose 70 - 140 mg/dl 105 93 110  BUN 7.0 - 26.0 mg/dL 11.5 13.5 8.3  Creatinine 0.7 - 1.3 mg/dL 0.8 0.8 0.8  Sodium 136 - 145 mEq/L 140 138 140  Potassium 3.5 - 5.1 mEq/L 4.0 4.0 3.6  Chloride 101 - 111 mmol/L - - -  CO2 22 - 29 mEq/L _0 Calcium 8.4 - 10.4 mg/dL 9.7 9.5 9.2  Total Protein 6.4 - 8.3 g/dL 8.0 7.7 7.3  Total Bilirubin 0.20 - 1.20 mg/dL 0.41 0.43 0.46  Alkaline Phos 40 - 150 U/L 97 95 75  AST 5 - 34 U/L _1 ALT 0 - 55 U/L 23 31 47    PATHOLOGY REPORT  Diagnosis 11/02/2015 1. Colon, segmental resection for tumor,  sigmoid ADENOCARCINOMA OF THE SIGMOID COLON (2.0 CM), GRADE 3 THE TUMOR INVADES MUSCULARIS PROPRIA MARGINS OF RESECTION ARE NEGATIVE METASTATIC ADENOCARCINOMA IN THREE OF THIRTEEN LYMPH NODES (3/13) 2. Colon, resection margin (donut), distal sigmoid BENIGN COLONIC TISSUE Microscopic Comment 1. COLON AND RECTUM (INCLUDING TRANS-ANAL RESECTION): Specimen: Sigmoid Procedure: Segmental resection Tumor site: sigmoid Specimen integrity: Intact Macroscopic  intactness of mesorectum: Not applicable: x Complete: NA Near complete: NA Incomplete: NA Cannot be determined (specify): NA Macroscopic tumor perforation: Muscularis Invasive tumor: Maximum size: 2.0 cm Histologic type(s): Adenocarcinoma Histologic grade and differentiation: G3 G1: well differentiated/low grade G2: moderately differentiated/low grade G3: poorly differentiated/high grade G4: undifferentiated/high grade Type of polyp in which invasive carcinoma arose: Tubular adenoma Microscopic extension of invasive tumor: Muscularis propria Lymph-Vascular invasion: Negative Peri-neural invasion: Negative Tumor deposit(s) (discontinuous extramural extension): Negative Resection margins: Proximal margin: Negative Distal margin: Negative Circumferential (radial) (posterior ascending, posterior descending; lateral and posterior mid-rectum; and entire lower 1/3 rectum):Negative Mesenteric margin (sigmoid and transverse): Negative Distance closest margin (if all above margins negative): 3.5 cm Trans-anal resection margins only: Deep margin: NA Mucosal Margin: NA Distance closest mucosal margin (if negative): NA Treatment effect (neo-adjuvant therapy): Partial Additional polyp(s): None Non-neoplastic findings: unremarkable Lymph nodes: number examined 13; number positive: 3 Pathologic Staging: T2, N1b, M1a     RADIOGRAPHIC STUDIES: I have personally reviewed the radiological images as listed and agreed with the findings in  the report.  PET 04/12/2015 IMPRESSION: Hypermetabolic colonic mass near rectosigmoid junction, consistent with known primary colon carcinoma.  Tiny sub-cm pericolonic lymph nodes in sigmoid mesocolon are too small to characterize by PET, but are suspicious for early lymph node metastases.  Bilateral pulmonary metastases  PET 09/18/2015 IMPRESSION: 1. Hypermetabolic pulmonary metastases have enlarged slightly from 07/18/2015. 2. Hypermetabolic porta hepatis/abdominal peritoneal ligament lymph nodes, stable from 04/12/2015. 3. Increase in hypermetabolism associated with mesenteric haziness and nodularity. While a reactive phenomenon/panniculitis can create this in appearance, metastatic disease/lymphoproliferative disorder cannot be excluded. 4. Hepatic steatosis. 5. Bilateral nephrolithiasis.  CT chest, abdomen and pelvis with contrast 02/09/2016 IMPRESSION: 1. Pulmonary nodules that appear to be within the radiation fields appear slightly smaller on today's study. The patient does have nodules liver progressed in the interval including a 6 mm posterior left upper lobe nodule and an 11 mm nodule in the posterior medial aspect of the right lower lobe. 2. No evidence for metastatic disease in the abdomen or pelvis. 3. Nonobstructing right renal stone. 4. Edema and stranding around the colonic anastomosis is decreased in the interval.  Ct chest wo contrast 04/05/2016 IMPRESSION: Progressive radiation changes in posterior right upper lobe and superior right lower lobe.  12 mm nodular opacity in the superior segment right lower lobe, mildly increased, indeterminate.  Scattered left lung nodules measuring up to 8 mm, suspicious for metastases, mildly progressed.   ASSESSMENT & PLAN:  55 year old male, without significant past medical history except kidney stone, presented with intermittent bloody stool for 2 years, and colonoscopy showed a large sigmoid colon mass, CT  scan showed multiple (at least 4) nodules in bilateral lungs, measuring about 1 cm.  1. Sigmoid colon adenocarcinoma, [id number], probably stage IV with lung mets  -I previously reviewed his colonoscopy, CT scan findings and the biopsy results in great details with patient and his wife. -I reviewed his surgical pathology findings, which showed a residual T2 primary tumor, 3 lymph nodes positive, grade 3 disease, certainly high risk disease.  -We discussed that his disease is likely incurable, and the goal of therapy is maximum disease control and prolong his life.  -- His tumor does not contain KRAS/NRAS or BRAF mutation, so he would benefit from EGFR antibody, Panitumumab was added on from cycle 4. He received a total of 4 month of chemotherapy which was held per patient's request.He declined more chemo after his colon surgery.  --I personally  reviewed his CT scan image from  04/05/2016 and compared with prior CT with pt in person, the previous radiated area in the superior segment right lower lobe has developed infiltrative change, I suspect this could be cancer progression, although radiation pneumonitis is still also possibility, but it would be unusual for giving the timeframe after radiation 4- 5 months ago. He also has a few other small lung nodules has slightly increased in size. -Clinically he has persistent, we'll slightly worse dry cough. He completed a course of prednisone, his cough is not typical from radiation pneumonitis. -I strongly encouraged him to restart chemotherapy due to the concern of cancer progression in his lungs. -However he is very reluctant to take chemotherapy and panitumumab due to the concern of side effects. -After a long discussion with patient, and with the encouragement from his wife, he finally agreed to start chemotherapy in a few weeks -Due to his prior poor tolerance to oxaliplatin and panitumumab, I recommend his to take FOLFIRI and avastin. He agreed, but refused to  have all treatment on same day, I am ok to give Avastin on a different day  -Chemotherapy consent: Side effects including but does not not limited to, fatigue, nausea, vomiting, diarrhea, hair loss, neuropathy, fluid retention, renal and kidney dysfunction, neutropenic fever, needed for blood transfusion, bleeding, were discussed with patient in great detail. She agrees to proceed. -He will start the week of 10/16 -port placement  2. Dry cough -His initial cough is likely related to his SBRT,he received a course of prednisone  -I'm concerned he is dry cough to be related to his cancer progression in the lung  -I reviewed his Hycodan   3. Obesity  -I encouraged him to eat healthy and exercise   Plan -port placement, I will contact Dr. Zella Richer  -He will start Avastin on 10/16, chemo and f/u on 10/19, will only give irinotecan for first cycle to see how he dose   I spent 35 minutes counseling the patient face to face. The total time spent in the appointment was 40 minutes and more than 50% was on counseling.     Truitt Merle, MD 04/12/2016

## 2016-04-12 NOTE — Telephone Encounter (Signed)
Pt came to check out after md visit. I advised pt md ordered folfiri and that it takes approx 3.5hrs. Pt said he has had chemo before that has not taken that long and asks to speak with RN. Murtle came up and s/w pt. After conversing with Murtle the pt left without completing check out and without scheduling appts as ordered.

## 2016-04-17 ENCOUNTER — Telehealth: Payer: Self-pay | Admitting: Hematology

## 2016-04-17 ENCOUNTER — Ambulatory Visit: Payer: BLUE CROSS/BLUE SHIELD | Admitting: Radiation Oncology

## 2016-04-17 NOTE — Telephone Encounter (Signed)
Spoke with pt to confirm appt dates/times per LOS

## 2016-04-19 ENCOUNTER — Other Ambulatory Visit: Payer: Self-pay | Admitting: *Deleted

## 2016-04-19 ENCOUNTER — Other Ambulatory Visit: Payer: Self-pay | Admitting: Hematology

## 2016-04-22 ENCOUNTER — Other Ambulatory Visit: Payer: BLUE CROSS/BLUE SHIELD

## 2016-04-22 ENCOUNTER — Other Ambulatory Visit: Payer: Self-pay | Admitting: Hematology and Oncology

## 2016-04-22 ENCOUNTER — Ambulatory Visit
Admission: RE | Admit: 2016-04-22 | Discharge: 2016-04-22 | Disposition: A | Discharge status: Home / Self Care | Payer: BLUE CROSS/BLUE SHIELD | Source: Ambulatory Visit | Attending: Radiation Oncology | Admitting: Radiation Oncology

## 2016-04-22 ENCOUNTER — Ambulatory Visit: Payer: BLUE CROSS/BLUE SHIELD

## 2016-04-22 ENCOUNTER — Ambulatory Visit: Payer: Self-pay | Admitting: General Surgery

## 2016-04-22 ENCOUNTER — Telehealth: Payer: Self-pay | Admitting: Hematology

## 2016-04-22 ENCOUNTER — Encounter: Payer: Self-pay | Admitting: Radiation Oncology

## 2016-04-22 DIAGNOSIS — Z923 Personal history of irradiation: Secondary | ICD-10-CM | POA: Insufficient documentation

## 2016-04-22 DIAGNOSIS — R05 Cough: Secondary | ICD-10-CM | POA: Insufficient documentation

## 2016-04-22 DIAGNOSIS — Z803 Family history of malignant neoplasm of breast: Secondary | ICD-10-CM | POA: Diagnosis not present

## 2016-04-22 DIAGNOSIS — C7800 Secondary malignant neoplasm of unspecified lung: Secondary | ICD-10-CM | POA: Diagnosis present

## 2016-04-22 DIAGNOSIS — Z9889 Other specified postprocedural states: Secondary | ICD-10-CM | POA: Insufficient documentation

## 2016-04-22 DIAGNOSIS — C189 Malignant neoplasm of colon, unspecified: Secondary | ICD-10-CM | POA: Insufficient documentation

## 2016-04-22 DIAGNOSIS — Z9221 Personal history of antineoplastic chemotherapy: Secondary | ICD-10-CM | POA: Diagnosis not present

## 2016-04-22 DIAGNOSIS — Z79899 Other long term (current) drug therapy: Secondary | ICD-10-CM | POA: Diagnosis not present

## 2016-04-22 DIAGNOSIS — F419 Anxiety disorder, unspecified: Secondary | ICD-10-CM | POA: Insufficient documentation

## 2016-04-22 NOTE — Telephone Encounter (Signed)
Dr. Zella Richer called and suggested to hold his Avastin which is scheduled for late today,  due to port placement within the next week. I informed pt and infusion room. He will start chemo on 10/19  Bill Wright  04/22/2016

## 2016-04-22 NOTE — Progress Notes (Signed)
Bill Wright here for reassessment S/P SBRT to his right and Left Upper Lobes with report that he has intermittent muscle spasms in the lower chest regions, left and right with visible quivering of muscles in this area, anywhere from 10-30 seconds. He continues to have a dry cough daily since June.  Denies any SOB when ambulating.  He is working, and going to [employer].      BP 137/89 (BP Location: Right Wrist, Patient Position: Sitting, Cuff Size: Large)   Pulse (!) 124 Comment: regular  Temp 98.3 F (36.8 C) (Oral)   Resp 20   Ht 5\' 6"  (1.676 m)   Wt 213 lb (96.6 kg)   BMI 34.38 kg/m    BP (!) 137/99 (BP Location: Right Arm, Patient Position: Sitting, Cuff Size: Large)   Pulse (!) 118   Temp 98.3 F (36.8 C) (Oral)   Resp 20   Ht 5\' 6"  (1.676 m)   Wt 213 lb (96.6 kg)   SpO2 97%   BMI 34.38 kg/m     Wt Readings from Last 3 Encounters:  04/22/16 213 lb (96.6 kg)  04/12/16 211 lb 14.4 oz (96.1 kg)  02/16/16 217 lb 14.4 oz (98.8 kg)

## 2016-04-23 NOTE — Progress Notes (Signed)
Radiation Oncology         [phone]) [phone] ________________________________  Name: Bill Wright MRN: [id number]  Date: 04/22/2016  DOB: 07-11-1960  Follow-Up Visit Note  CC: Lujean Amel, MD  Jackolyn Confer, MD  Diagnosis:      ICD-9-CM ICD-10-CM   1. Colon cancer metastasized to lung (HCC) 153.9 C18.9    197.0 C78.00      Interval Since Last Radiation: 4.5 months  11/29/15-12/05/15: SBRT to the Right upper lobe and to the Left left upper lobe.  Narrative:  In summary this is a pleasant 55 y.o. male with a history of progressive metastatic stage IV adenocarcinoma of the colon originally diagnosed in September 2016. He received surgical resection of his cecum and subsequently received adjuvant chemotherapy between October 2016 and March 2017. He discontinued treatment at his request, and underwent interval surgical resection with resection of his sigmoid disease. He has been followed with pulmonary disease as well and attempted biopsy could not be completed. He did go on to receive SBRT to lesions in the right and left upper lobes of the lungs in May 2017. He has been off systemic therapy during this time as well and his last CT c/a/p did not reveal disease in the abdomen or pelvis but does show some slight progression of pulmonary nodules between scans in August and September 2017. He comes for follow up and is considering resuming chemotherapy due to these findings with Dr. Burr Medico. He is hesitant to proceed however and is concerned that he doesn't want to go on to receive chemo if his disease is only changing by a mm or so. Also of noted, in the interim since completing radiation, he was treated for a chronic cough with supportive measures including expectorants, OTC antihistamines, and as his symptoms continued, he also received prednisone taper and was treated as though this was radiation pneumonitis. Interestingly his symptoms only had modest improvement with steroids.  On review of  systems, the patient reports that he is doing well overall. He is feeling pretty well despite a non productive cough. He denies any chest pain, shortness of breath, fevers, chills, night sweats, unintended weight changes. He denies any bowel or bladder disturbances, and denies abdominal pain, nausea or vomiting. He denies any new musculoskeletal or joint aches or pains. A complete review of systems is obtained and is otherwise negative.   Past Medical History:  Past Medical History:  Diagnosis Date  . Anxiety    situational due to cancer diagnosis  . Cancer [facility]) 2017   colon-chemo 09/22/15 now surgery  . Hypercholesteremia   . Kidney calculi     Past Surgical History: Past Surgical History:  Procedure Laterality Date  . COLONOSCOPY  03/30/15  . LAPAROSCOPIC PARTIAL COLECTOMY N/A 11/02/2015   Procedure: LAPAROSCOPIC ASSISTED SIGMOID COLECTOMY;  Surgeon: Jackolyn Confer, MD;  Location: [facility];  Service: General;  Laterality: N/A;    Social History:  Social History   Social History  . Marital status: Married    Spouse name: N/A  . Number of children: N/A  . Years of education: N/A   Occupational History  . Not on file.   Social History Main Topics  . Smoking status: Never Smoker  . Smokeless tobacco: Never Used  . Alcohol use No  . Drug use: No  . Sexual activity: Not on file   Other Topics Concern  . Not on file   Social History Narrative   Married, wife Tonian   #2 sons/ #1 daughter (ages  03/18/13)   Art gallery manager at [employer] [place]   Wishes to work through his treatment period    Family History: Family History  Problem Relation Age of Onset  . Breast cancer Mother 74    +rad and lymph node  . Diabetes Maternal Grandmother     leading to blindness  . Obesity Maternal Aunt   . Stroke Maternal Uncle 65     ALLERGIES:  has No Known Allergies.  Meds: Current Outpatient Prescriptions  Medication Sig Dispense Refill  . clindamycin (CLINDAGEL) 1 % gel Apply  topically 2 (two) times daily. (Patient not taking: Reported on 04/12/2016) 30 g 5  . guaifenesin (ROBITUSSIN) 100 MG/5ML syrup Take 200 mg by mouth 3 (three) times daily as needed for cough.    Marland Kitchen HYDROcodone-acetaminophen (NORCO/VICODIN) 5-325 MG tablet Take 1 tablet by mouth every 4 (four) hours as needed. Reported on 01/11/2016 30 tablet 0  . HYDROcodone-homatropine (HYCODAN) 5-1.5 MG/5ML syrup Take 5 mLs by mouth every 6 (six) hours as needed for cough. 120 mL 0  . hydrocortisone 2.5 % lotion APPLY EXTERNALLY TO THE AFFECTED AREA TWICE DAILY (Patient not taking: Reported on 04/12/2016) 200 mL 0  . ibuprofen (ADVIL,MOTRIN) 200 MG tablet Take 600 mg by mouth every 6 (six) hours as needed.     No current facility-administered medications for this encounter.     Physical Findings:  height is 5\' 6"  (1.676 m) and weight is 213 lb (96.6 kg). His oral temperature is 98.3 F (36.8 C). His blood pressure is 137/99 (abnormal) and his pulse is 118 (abnormal). His respiration is 20 and oxygen saturation is 97%.   In general this is a well appearing Caucasian male in no acute distress. He is alert and oriented x4 and appropriate throughout the examination. HEENT reveals that the patient is normocephalic, atraumatic. EOMs are intact. PERRLA. Skin is intact without any evidence of gross lesions. Cardiovascular exam reveals a regular rate and rhythm, no clicks rubs or murmurs are auscultated. Chest is clear to auscultation bilaterally. Lymphatic assessment is performed and does not reveal any adenopathy in the cervical or supraclavicular chains.  Lower extremities are negative for pretibial pitting edema, cyanosis or clubbing.     Lab Findings: Lab Results  Component Value Date   WBC 8.0 02/16/2016   HGB 13.2 02/16/2016   HCT 39.5 02/16/2016   MCV 86.1 02/16/2016   PLT 257 02/16/2016     Radiographic Findings: Ct Chest Wo Contrast  Result Date: 04/05/2016 CLINICAL DATA:  Metastatic colon cancer,  follow-up lung metastases EXAM: CT CHEST WITHOUT CONTRAST TECHNIQUE: Multidetector CT imaging of the chest was performed following the standard protocol without IV contrast. COMPARISON:  02/09/2016 FINDINGS: Cardiovascular: The heart is top-normal in size. No pericardial effusion. Coronary atherosclerosis in the LAD. Mild atherosclerotic calcifications of the aortic root. Mediastinum/Nodes: No suspicious mediastinal lymphadenopathy. Visualized thyroid is unremarkable. Lungs/Pleura: Progressive radiation changes in the posterior right upper lobe (series 5/ image 61) and superior right lower lobe (series 5/ image 82). Adjacent 12 mm nodular opacity in the superior segment right lower lobe (series 5/ image 69), previously 11 mm, possibly radiation changes versus metastasis. Scattered left lung nodules, suspicious for metastases, mildly progressed: --8 mm nodule in the left lung apex (series 5/ image 31), previously 7 mm --7 mm nodule in the posterior left upper lobe (series 5/ image 45), previously 6 mm --6 mm nodule in the left lower lobe (series 5/ image 101), previously 5 mm Mild pleural-parenchymal scarring along  the left lung apex. No pleural effusion or pneumothorax. Upper Abdomen: Visualized upper abdomen is notable for mild hepatic steatosis. Musculoskeletal: Degenerative changes of the visualized thoracolumbar spine. IMPRESSION: Progressive radiation changes in posterior right upper lobe and superior right lower lobe. 12 mm nodular opacity in the superior segment right lower lobe, mildly increased, indeterminate. Scattered left lung nodules measuring up to 8 mm, suspicious for metastases, mildly progressed. Electronically Signed   By: Julian Hy M.D.   On: 04/05/2016 09:35    Impression/Plan: 1. Progressive metastatic stage IV adenocarcinoma of the colon with pulmonary disease. Dr. Lisbeth Renshaw reviews the radiology reports from his most recent CT scans. He discusses with the patient that he would like to  fuse his most recent CT scans with his treatment planning imaging to determine if there has been interval growth or new nodules since treatment, and to determine if there is a role for more SBRT. If the patient is interested in being aggressive with treatment however, Dr. Lisbeth Renshaw stresses the importance of considering long term consequences and possible benefits of intervening with systemic therapy. We will also be in touch with the patient and Dr. Burr Medico as he is considering a start date to chemotherapy this Thursday.  2. Dry cough. Dr. Lisbeth Renshaw and I have discussed that this does not appear to be due to radiotherapy at this point, but could be due to irritation of his lung parenchyma due to his pulmonary metastases. He is comfortable following this closely and will notify us if he is concerned about progressive symptoms.    In a visit lasting 45 minutes, greater than 50% of our time was spent face to face discussing the pulmonary nodules on imaging and by the radiologists' reports and outlining the plan to fuse his previous scans with his most recent CT images.    Carola Rhine, PAC

## 2016-04-24 ENCOUNTER — Telehealth: Payer: Self-pay | Admitting: Radiation Oncology

## 2016-04-24 NOTE — Telephone Encounter (Addendum)
I called the patient to review CT findings since we fused his treatment imaging to his recent CT scan. LM asking him to call me back to discuss.  The patient called back and we discussed that after Dr. Lisbeth Renshaw reviewed his imaging, there are 2 lesions in the left lung and 1 in the right that have changed since treatment but have not been treated. He was offered SBRT after his next interval scan if he does not pursue chemotherapy treatment, versus chemotherapy as he has discussed with Dr. Burr Medico. He will let me know which route he would like to go and we will plan follow up with him after he contacts [place].

## 2016-04-25 ENCOUNTER — Ambulatory Visit (HOSPITAL_BASED_OUTPATIENT_CLINIC_OR_DEPARTMENT_OTHER): Payer: BLUE CROSS/BLUE SHIELD

## 2016-04-25 ENCOUNTER — Encounter: Payer: Self-pay | Admitting: *Deleted

## 2016-04-25 ENCOUNTER — Other Ambulatory Visit (HOSPITAL_BASED_OUTPATIENT_CLINIC_OR_DEPARTMENT_OTHER): Payer: BLUE CROSS/BLUE SHIELD

## 2016-04-25 ENCOUNTER — Encounter: Payer: Self-pay | Admitting: Hematology

## 2016-04-25 ENCOUNTER — Ambulatory Visit (HOSPITAL_BASED_OUTPATIENT_CLINIC_OR_DEPARTMENT_OTHER): Payer: BLUE CROSS/BLUE SHIELD | Admitting: Hematology

## 2016-04-25 VITALS — BP 139/84 | HR 112 | Temp 98.2°F | Resp 18 | Ht 66.0 in | Wt 213.6 lb

## 2016-04-25 DIAGNOSIS — C182 Malignant neoplasm of ascending colon: Secondary | ICD-10-CM

## 2016-04-25 DIAGNOSIS — C189 Malignant neoplasm of colon, unspecified: Secondary | ICD-10-CM

## 2016-04-25 DIAGNOSIS — C187 Malignant neoplasm of sigmoid colon: Secondary | ICD-10-CM

## 2016-04-25 DIAGNOSIS — C7800 Secondary malignant neoplasm of unspecified lung: Secondary | ICD-10-CM

## 2016-04-25 DIAGNOSIS — E669 Obesity, unspecified: Secondary | ICD-10-CM

## 2016-04-25 DIAGNOSIS — Z5111 Encounter for antineoplastic chemotherapy: Secondary | ICD-10-CM | POA: Diagnosis not present

## 2016-04-25 DIAGNOSIS — R05 Cough: Secondary | ICD-10-CM | POA: Diagnosis not present

## 2016-04-25 LAB — CBC WITH DIFFERENTIAL/PLATELET
BASO%: 0.5 % (ref 0.0–2.0)
Basophils Absolute: 0 10*3/uL (ref 0.0–0.1)
EOS%: 1.1 % (ref 0.0–7.0)
Eosinophils Absolute: 0.1 10*3/uL (ref 0.0–0.5)
HEMATOCRIT: 40.4 % (ref 38.4–49.9)
HGB: 13.3 g/dL (ref 13.0–17.1)
LYMPH#: 0.9 10*3/uL (ref 0.9–3.3)
LYMPH%: 14.4 % (ref 14.0–49.0)
MCH: 28.3 pg (ref 27.2–33.4)
MCHC: 32.9 g/dL (ref 32.0–36.0)
MCV: 86.1 fL (ref 79.3–98.0)
MONO#: 0.7 10*3/uL (ref 0.1–0.9)
MONO%: 11.9 % (ref 0.0–14.0)
NEUT#: 4.5 10*3/uL (ref 1.5–6.5)
NEUT%: 72.1 % (ref 39.0–75.0)
Platelets: 219 10*3/uL (ref 140–400)
RBC: 4.69 10*6/uL (ref 4.20–5.82)
RDW: 14.8 % — AB (ref 11.0–14.6)
WBC: 6.3 10*3/uL (ref 4.0–10.3)

## 2016-04-25 LAB — COMPREHENSIVE METABOLIC PANEL
ALK PHOS: 86 U/L (ref 40–150)
ALT: 28 U/L (ref 0–55)
ANION GAP: 9 meq/L (ref 3–11)
AST: 23 U/L (ref 5–34)
Albumin: 3.6 g/dL (ref 3.5–5.0)
BUN: 12.1 mg/dL (ref 7.0–26.0)
CO2: 26 meq/L (ref 22–29)
Calcium: 9.6 mg/dL (ref 8.4–10.4)
Chloride: 105 mEq/L (ref 98–109)
Creatinine: 0.8 mg/dL (ref 0.7–1.3)
GLUCOSE: 107 mg/dL (ref 70–140)
POTASSIUM: 4 meq/L (ref 3.5–5.1)
SODIUM: 140 meq/L (ref 136–145)
Total Bilirubin: 0.43 mg/dL (ref 0.20–1.20)
Total Protein: 7.5 g/dL (ref 6.4–8.3)

## 2016-04-25 LAB — CEA (IN HOUSE-CHCC): CEA (CHCC-In House): 1.66 ng/mL (ref 0.00–5.00)

## 2016-04-25 MED ORDER — ATROPINE SULFATE 1 MG/ML IJ SOLN: 0.5000 mg | Freq: Once | INTRAMUSCULAR | Status: DC | PRN

## 2016-04-25 MED ORDER — PALONOSETRON HCL INJECTION 0.25 MG/5ML
0.2500 mg | Freq: Once | INTRAVENOUS | Status: AC
  Administered 2016-04-25: 0.25 mg via INTRAVENOUS

## 2016-04-25 MED ORDER — PALONOSETRON HCL INJECTION 0.25 MG/5ML
INTRAVENOUS | Status: AC
  Filled 2016-04-25: qty 5

## 2016-04-25 MED ORDER — PROCHLORPERAZINE MALEATE 10 MG PO TABS: 10.0000 mg | ORAL_TABLET | Freq: Four times a day (QID) | ORAL | 1 refills | Status: DC | PRN

## 2016-04-25 MED ORDER — ONDANSETRON HCL 8 MG PO TABS: 8.0000 mg | ORAL_TABLET | Freq: Two times a day (BID) | ORAL | 1 refills | Status: DC | PRN

## 2016-04-25 MED ORDER — DEXAMETHASONE SODIUM PHOSPHATE 10 MG/ML IJ SOLN
10.0000 mg | Freq: Once | INTRAMUSCULAR | Status: AC
  Administered 2016-04-25: 10 mg via INTRAVENOUS

## 2016-04-25 MED ORDER — DEXAMETHASONE SODIUM PHOSPHATE 10 MG/ML IJ SOLN
INTRAMUSCULAR | Status: AC
  Filled 2016-04-25: qty 1

## 2016-04-25 MED ORDER — LIDOCAINE-PRILOCAINE 2.5-2.5 % EX CREA: 1.0000 "application " | TOPICAL_CREAM | CUTANEOUS | 1 refills | Status: DC | PRN

## 2016-04-25 MED ORDER — SODIUM CHLORIDE 0.9 % IV SOLN
Freq: Once | INTRAVENOUS | Status: AC
  Administered 2016-04-25: 12:00:00 via INTRAVENOUS

## 2016-04-25 MED ORDER — IRINOTECAN HCL CHEMO INJECTION 100 MG/5ML
160.0000 mg/m2 | Freq: Once | INTRAVENOUS | Status: AC
  Administered 2016-04-25: 340 mg via INTRAVENOUS
  Filled 2016-04-25: qty 2

## 2016-04-25 NOTE — Patient Instructions (Addendum)
[facility] Discharge Instructions for Patients Receiving Chemotherapy  Today you received the following chemotherapy agents irinotecan  To help prevent nausea and vomiting after your treatment, we encourage you to take your nausea medication as directed  If you develop nausea and vomiting that is not controlled by your nausea medication, call the clinic.   BELOW ARE SYMPTOMS THAT SHOULD BE REPORTED IMMEDIATELY:  *FEVER GREATER THAN 100.5 F  *CHILLS WITH OR WITHOUT FEVER  NAUSEA AND VOMITING THAT IS NOT CONTROLLED WITH YOUR NAUSEA MEDICATION  *UNUSUAL SHORTNESS OF BREATH  *UNUSUAL BRUISING OR BLEEDING  TENDERNESS IN MOUTH AND THROAT WITH OR WITHOUT PRESENCE OF ULCERS  *URINARY PROBLEMS  *BOWEL PROBLEMS  UNUSUAL RASH Items with * indicate a potential emergency and should be followed up as soon as possible.  Feel free to call the clinic you have any questions or concerns. The clinic phone number is [phone].  Irinotecan injection What is this medicine? IRINOTECAN (ir in oh TEE kan ) is a chemotherapy drug. It is used to treat colon and rectal cancer. This medicine may be used for other purposes; ask your health care provider or pharmacist if you have questions. What should I tell my health care provider before I take this medicine? They need to know if you have any of these conditions: -blood disorders -dehydration -diarrhea -infection (especially a virus infection such as chickenpox, cold sores, or herpes) -liver disease -low blood counts, like low white cell, platelet, or red cell counts -recent or ongoing radiation therapy -an unusual or allergic reaction to irinotecan, sorbitol, other chemotherapy, other medicines, foods, dyes, or preservatives -pregnant or trying to get pregnant -breast-feeding How should I use this medicine? This drug is given as an infusion into a vein. It is administered in a hospital or clinic by a specially trained health  care professional. Talk to your pediatrician regarding the use of this medicine in children. Special care may be needed. Overdosage: If you think you have taken too much of this medicine contact a poison control center or emergency room at once. NOTE: This medicine is only for you. Do not share this medicine with others. What if I miss a dose? It is important not to miss your dose. Call your doctor or health care professional if you are unable to keep an appointment. What may interact with this medicine? Do not take this medicine with any of the following medications: -atazanavir -certain medicines for fungal infections like itraconazole and ketoconazole -St. John's Wort This medicine may also interact with the following medications: -dexamethasone -diuretics -laxatives -medicines for seizures like carbamazepine, mephobarbital, phenobarbital, phenytoin, primidone -medicines to increase blood counts like filgrastim, pegfilgrastim, sargramostim -prochlorperazine -vaccines This list may not describe all possible interactions. Give your health care provider a list of all the medicines, herbs, non-prescription drugs, or dietary supplements you use. Also tell them if you smoke, drink alcohol, or use illegal drugs. Some items may interact with your medicine. What should I watch for while using this medicine? Your condition will be monitored carefully while you are receiving this medicine. You will need important blood work done while you are taking this medicine. This drug may make you feel generally unwell. This is not uncommon, as chemotherapy can affect healthy cells as well as cancer cells. Report any side effects. Continue your course of treatment even though you feel ill unless your doctor tells you to stop. In some cases, you may be given additional medicines to help with side effects.  Follow all directions for their use. You may get drowsy or dizzy. Do not drive, use machinery, or do  anything that needs mental alertness until you know how this medicine affects you. Do not stand or sit up quickly, especially if you are an older patient. This reduces the risk of dizzy or fainting spells. Call your doctor or health care professional for advice if you get a fever, chills or sore throat, or other symptoms of a cold or flu. Do not treat yourself. This drug decreases your body's ability to fight infections. Try to avoid being around people who are sick. This medicine may increase your risk to bruise or bleed. Call your doctor or health care professional if you notice any unusual bleeding. Be careful brushing and flossing your teeth or using a toothpick because you may get an infection or bleed more easily. If you have any dental work done, tell your dentist you are receiving this medicine. Avoid taking products that contain aspirin, acetaminophen, ibuprofen, naproxen, or ketoprofen unless instructed by your doctor. These medicines may hide a fever. Do not become pregnant while taking this medicine. Women should inform their doctor if they wish to become pregnant or think they might be pregnant. There is a potential for serious side effects to an unborn child. Talk to your health care professional or pharmacist for more information. Do not breast-feed an infant while taking this medicine. What side effects may I notice from receiving this medicine? Side effects that you should report to your doctor or health care professional as soon as possible: -allergic reactions like skin rash, itching or hives, swelling of the face, lips, or tongue -low blood counts - this medicine may decrease the number of white blood cells, red blood cells and platelets. You may be at increased risk for infections and bleeding. -signs of infection - fever or chills, cough, sore throat, pain or difficulty passing urine -signs of decreased platelets or bleeding - bruising, pinpoint red spots on the skin, black, tarry  stools, blood in the urine -signs of decreased red blood cells - unusually weak or tired, fainting spells, lightheadedness -breathing problems -chest pain -diarrhea -feeling faint or lightheaded, falls -flushing, runny nose, sweating during infusion -mouth sores or pain -pain, swelling, redness or irritation where injected -pain, swelling, warmth in the leg -pain, tingling, numbness in the hands or feet -problems with balance, talking, walking -stomach cramps, pain -trouble passing urine or change in the amount of urine -vomiting as to be unable to hold down drinks or food -yellowing of the eyes or skin Side effects that usually do not require medical attention (report to your doctor or health care professional if they continue or are bothersome): -constipation -hair loss -headache -loss of appetite -nausea, vomiting -stomach upset This list may not describe all possible side effects. Call your doctor for medical advice about side effects. You may report side effects to FDA at 1-800-FDA-1088. Where should I keep my medicine? This drug is given in a hospital or clinic and will not be stored at home. NOTE: This sheet is a summary. It may not cover all possible information. If you have questions about this medicine, talk to your doctor, pharmacist, or health care provider.    2016, [employer] Standard. (2012-12-21 16:29:32)

## 2016-04-25 NOTE — Progress Notes (Addendum)
Bill  [phone]) [phone] Fax:([phone]) [phone]  Clinic Follow Up Note   Patient Care Team: Dibas Koirala, MD as PCP - General (Family Medicine) Wilford Corner, MD as Consulting Physician (Gastroenterology) Jackolyn Confer, MD as Consulting Physician (General Surgery) Truitt Merle, MD as Consulting Physician (Hematology) 04/25/2016  CHIEF COMPLAINTS:  Follow Up colon cancer  Oncology History   T2, Colon cancer metastasized to lung [facility])   Staging form: Colon and Rectum, AJCC 7th Edition     Clinical stage from 03/30/2015: Stage Unknown ([place], N1, M1) - Unsigned       Colon cancer metastasized to lung [facility]) s/p laparoscopic assisted sigmoid colectomy 11/02/15   03/30/2015 Miscellaneous    Foundation one genomic testing showed TP53 mutation, MSI stable, low tumor mutation burden. Negative for K-ras, NRAS and BRAF      03/30/2015 Initial Biopsy    Sigmoid mass biopsy showed invasive adenocarcinoma. Cecal colon polyps showed tubular adenoma.      03/30/2015 Initial Diagnosis    Colon cancer      03/30/2015 Procedure    colonoscopy by Dr. Michail Sermon showed a fungating, infiltrative and ulcerated nonobstructing large mass in the sigmoid colon and at 20 cm proximal to the anus. The mass was partially circumferential no bleeding. A 10 mm polyps in the cecum was removed.      04/03/2015 Imaging    CT chest, abdomen and pelvis with contrast showed nodular masslike area of clinical worsening at rectosigmoid junction, tiny pericolonic lymph nodes, bilateral pulmonary nodules measuring about 1 cm.      04/12/2015 PET scan    Hypermetabolic colonic mass near rectosigmoid junction, tiny subcentimeter paracolonic lymph nodes. Bilateral pulmonary metastasis.      04/19/2015 Procedure    CT-guided lung nodule biopsy attempted, unsuccessful.      04/27/2015 - 09/14/2015 Chemotherapy    Oxaliplatin 130 mg/m on day 1, Capecitabine 2371m q12hr, 2 weeks on and one week off  (only received 7 days for first cycle), oxaliplatin held on cycle 5 and dose reduced to 1067mm2, capecitabine reduced to 200050m12h D1-14, stopped per pt's request.       06/29/2015 - 10/19/2015 Chemotherapy     Panitumumab every 2 weeks, some cycles were postponed due to pt's request, stoppe per pt's request       09/18/2015 Imaging    Pulmonary nodules are less hypermetabolic, stable size. Stable hypermetabolic portal hepatis and abdominal peritoneal ligament lymph nodes. No other new lesions.       11/02/2015 Surgery    sigmoid colon segmental resection       11/02/2015 Pathology Results    Sigmoid colon segmental resection showed adenocarcinoma, grade 3,  T2, 3 out of 13 lymph nodes were positive, surgical margins were negative. LVI(-), perineural invasion negative      11/13/2015 Imaging    CT chest, abdomen and pelvis with contrast showed postsurgical changes, mild progression of pulmonary metastasis, measuring up to 15 mm in the right lower lobe.      11/29/2015 - 12/05/2015 Radiation Therapy    SBRT to 4 lung lesions in 3 sessions        HISTORY OF PRESENTING ILLNESS:  [name] 55o. male is here because of recently newly diagnosed colon cancer.  He has had intermittent bloody stool for 2 years, it has been mild, mixed with stool, patient does not have any abdominal pain, constipation, change of his bowel habits, nausea, weight loss or other symptoms. He did not seek medical  attention for this. He went to emergency room on 03/15/2015 for right flank pain, due to his kidney stone. CT scan incidentally found a 11 mm right lower lobe nodule and mucosal edema in the sigmoid colon. He saw his primary care physician, and was referred to GI Dr. Michail Sermon here at he underwent colonoscopy on 03/30/2015, which showed a fungating infiltrative and ulcerated nonobstructing large mass in the sigmoid colon, biopsy showed adenocarcinoma. CT chest abdomen and pelvis showed multiple lung  nodules measuring about 1 cm. He was referred to surgeon Dr. Zella Richer, who referred patient to [place] for further workup of his lung nodule and discuss chemotherapy.  He feels very well overall, denies any symptoms. 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 He never had screening colonoscopy prior the reason one, no significant past medical history, does not see doctors regularly.  CURRENT TREATMENT: chemotherapy FOLFIRI and Avastin, starting on 04/25/2016   INTERIM HISTORY Bill Wright returns for follow-up and first dose chemo. He was seen by Dr. Lisbeth Renshaw earlier this week. His cough is unchanged, comes with a spell, no sputum production, no chest pain or dyspnea. No other new complaints.  MEDICAL HISTORY:  Past Medical History:  Diagnosis Date  . Anxiety    situational due to cancer diagnosis  . Cancer [facility]) 2017   colon-chemo 09/22/15 now surgery  . Hypercholesteremia   . Kidney calculi     SURGICAL HISTORY: Past Surgical History:  Procedure Laterality Date  . COLONOSCOPY  03/30/15  . LAPAROSCOPIC PARTIAL COLECTOMY N/A 11/02/2015   Procedure: LAPAROSCOPIC ASSISTED SIGMOID COLECTOMY;  Surgeon: Jackolyn Confer, MD;  Location: [facility];  Service: General;  Laterality: N/A;    SOCIAL HISTORY: Social History   Social History  . Marital Status: Married    Spouse Name: N/A  . Number of Children: 3, age of 41, 4 and 84   . Years of Education: N/A   Occupational History  . Banker for [employer] [place]    Social History Main Topics  . Smoking status: Never Smoker   . Smokeless tobacco: Not on file  . Alcohol Use: No  . Drug Use: No  . Sexual Activity: Not on file   Other Topics Concern  . Not on file   Social History Narrative    FAMILY HISTORY: Family History  Problem Relation Age of Onset  . Breast cancer Mother 38    +rad and lymph node  . Diabetes Maternal Grandmother     leading to blindness  . Obesity Maternal Aunt   . Stroke Maternal Uncle 65     ALLERGIES:  has No Known Allergies.  MEDICATIONS:  Current Outpatient Prescriptions  Medication Sig Dispense Refill  . clindamycin (CLINDAGEL) 1 % gel Apply topically 2 (two) times daily. 30 g 5  . guaifenesin (ROBITUSSIN) 100 MG/5ML syrup Take 200 mg by mouth 3 (three) times daily as needed for cough.    Marland Kitchen HYDROcodone-acetaminophen (NORCO/VICODIN) 5-325 MG tablet Take 1 tablet by mouth every 4 (four) hours as needed. Reported on 01/11/2016 30 tablet 0  . HYDROcodone-homatropine (HYCODAN) 5-1.5 MG/5ML syrup Take 5 mLs by mouth every 6 (six) hours as needed for cough. 120 mL 0  . hydrocortisone 2.5 % lotion APPLY EXTERNALLY TO THE AFFECTED AREA TWICE DAILY 200 mL 0  . ibuprofen (ADVIL,MOTRIN) 200 MG tablet Take 600 mg by mouth every 6 (six) hours as needed.    . lidocaine-prilocaine (EMLA) cream Apply 1 application topically as needed.  30 g 1  . ondansetron (ZOFRAN) 8 MG tablet Take 1 tablet (8 mg total) by mouth 2 (two) times daily as needed for refractory nausea / vomiting. Start on day 3 after chemotherapy. 30 tablet 1  . prochlorperazine (COMPAZINE) 10 MG tablet Take 1 tablet (10 mg total) by mouth every 6 (six) hours as needed (NAUSEA). 30 tablet 1   No current facility-administered medications for this visit.     REVIEW OF SYSTEMS:   Constitutional: Denies fevers, chills or abnormal night sweats, no weight loss. Eyes: Denies blurriness of vision, double vision or watery eyes Ears, nose, mouth, throat, and face: Denies mucositis or sore throat Respiratory: Denies cough, dyspnea or wheezes Cardiovascular: Denies palpitation, chest discomfort or lower extremity swelling Gastrointestinal:  Denies nausea, heartburn or change in bowel habits Skin: Denies abnormal skin rashes Lymphatics: Denies new lymphadenopathy or easy bruising Neurological:Denies numbness, tingling or new weaknesses Behavioral/Psych: Mood is stable, no new changes  All other systems were reviewed with the patient  and are negative.  PHYSICAL EXAMINATION: ECOG PERFORMANCE STATUS: 1 BP 139/84 (BP Location: Left Arm, Patient Position: Sitting)   Pulse (!) 112 Comment: informed nurse  Temp 98.2 F (36.8 C) (Oral)   Resp 18   Ht 5' 6"  (1.676 m)   Wt 213 lb 9.6 oz (96.9 kg)   SpO2 98%   BMI 34.48 kg/m  GENERAL:alert, no distress and comfortable SKIN: skin color, texture, turgor are normal, diffuse acne-like rash on his front chest and back, scattered rash on his neck and face, there is a healing large boil above his upper lip, no discharge  EYES: normal, conjunctiva are pink and non-injected, sclera clear OROPHARYNX:no exudate, no erythema and lips, buccal mucosa, and tongue normal  NECK: supple, thyroid normal size, non-tender, without nodularity LYMPH:  no palpable lymphadenopathy in the cervical, axillary or inguinal LUNGS: clear to auscultation and percussion with normal breathing effort HEART: regular rate & rhythm and no murmurs and no lower extremity edema ABDOMEN:abdomen soft, non-tender and normal bowel sounds. The midline incision has healed well, no discharge or skin erythema  Musculoskeletal:no cyanosis of digits and no clubbing  PSYCH: alert & oriented x 3 with fluent speech NEURO: no focal motor/sensory deficits  LABORATORY DATA:  I have reviewed the data as listed CBC Latest Ref Rng & Units 04/25/2016 02/16/2016 12/15/2015  WBC 4.0 - 10.3 10e3/uL 6.3 8.0 5.0  Hemoglobin 13.0 - 17.1 g/dL 13.3 13.2 13.6  Hematocrit 38.4 - 49.9 % 40.4 39.5 40.2  Platelets 140 - 400 10e3/uL 219 257 172   CMP Latest Ref Rng & Units 04/25/2016 02/16/2016 12/15/2015  Glucose 70 - 140 mg/dl 107 105 93  BUN 7.0 - 26.0 mg/dL 12.1 11.5 13.5  Creatinine 0.7 - 1.3 mg/dL 0.8 0.8 0.8  Sodium 136 - 145 mEq/L 140 140 138  Potassium 3.5 - 5.1 mEq/L 4.0 4.0 4.0  Chloride 101 - 111 mmol/L - - -  CO2 22 - 29 mEq/L 26 24 23   Calcium 8.4 - 10.4 mg/dL 9.6 9.7 9.5  Total Protein 6.4 - 8.3 g/dL 7.5 8.0 7.7  Total  Bilirubin 0.20 - 1.20 mg/dL 0.43 0.41 0.43  Alkaline Phos 40 - 150 U/L 86 97 95  AST 5 - 34 U/L 23 19 23   ALT 0 - 55 U/L 28 23 31    CEA:  04/20/2016: <0.5 06/08/2015: <0.5 09/07/2015: <0.5 11/15/2015: 0.9 02/16/2016: <1.0   PATHOLOGY REPORT  Diagnosis 11/02/2015 1. Colon, segmental resection for tumor, sigmoid ADENOCARCINOMA OF  THE SIGMOID COLON (2.0 CM), GRADE 3 THE TUMOR INVADES MUSCULARIS PROPRIA MARGINS OF RESECTION ARE NEGATIVE METASTATIC ADENOCARCINOMA IN THREE OF THIRTEEN LYMPH NODES (3/13) 2. Colon, resection margin (donut), distal sigmoid BENIGN COLONIC TISSUE Microscopic Comment 1. COLON AND RECTUM (INCLUDING TRANS-ANAL RESECTION): Specimen: Sigmoid Procedure: Segmental resection Tumor site: sigmoid Specimen integrity: Intact Macroscopic intactness of mesorectum: Not applicable: x Complete: NA Near complete: NA Incomplete: NA Cannot be determined (specify): NA Macroscopic tumor perforation: Muscularis Invasive tumor: Maximum size: 2.0 cm Histologic type(s): Adenocarcinoma Histologic grade and differentiation: G3 G1: well differentiated/low grade G2: moderately differentiated/low grade G3: poorly differentiated/high grade G4: undifferentiated/high grade Type of polyp in which invasive carcinoma arose: Tubular adenoma Microscopic extension of invasive tumor: Muscularis propria Lymph-Vascular invasion: Negative Peri-neural invasion: Negative Tumor deposit(s) (discontinuous extramural extension): Negative Resection margins: Proximal margin: Negative Distal margin: Negative Circumferential (radial) (posterior ascending, posterior descending; lateral and posterior mid-rectum; and entire lower 1/3 rectum):Negative Mesenteric margin (sigmoid and transverse): Negative Distance closest margin (if all above margins negative): 3.5 cm Trans-anal resection margins only: Deep margin: NA Mucosal Margin: NA Distance closest mucosal margin (if negative): NA Treatment  effect (neo-adjuvant therapy): Partial Additional polyp(s): None Non-neoplastic findings: unremarkable Lymph nodes: number examined 13; number positive: 3 Pathologic Staging: T2, N1b, M1a     RADIOGRAPHIC STUDIES: I have personally reviewed the radiological images as listed and agreed with the findings in the report.  PET 04/12/2015 IMPRESSION: Hypermetabolic colonic mass near rectosigmoid junction, consistent with known primary colon carcinoma.  Tiny sub-cm pericolonic lymph nodes in sigmoid mesocolon are too small to characterize by PET, but are suspicious for early lymph node metastases.  Bilateral pulmonary metastases  PET 09/18/2015 IMPRESSION: 1. Hypermetabolic pulmonary metastases have enlarged slightly from 07/18/2015. 2. Hypermetabolic porta hepatis/abdominal peritoneal ligament lymph nodes, stable from 04/12/2015. 3. Increase in hypermetabolism associated with mesenteric haziness and nodularity. While a reactive phenomenon/panniculitis can create this in appearance, metastatic disease/lymphoproliferative disorder cannot be excluded. 4. Hepatic steatosis. 5. Bilateral nephrolithiasis.  CT chest, abdomen and pelvis with contrast 02/09/2016 IMPRESSION: 1. Pulmonary nodules that appear to be within the radiation fields appear slightly smaller on today's study. The patient does have nodules liver progressed in the interval including a 6 mm posterior left upper lobe nodule and an 11 mm nodule in the posterior medial aspect of the right lower lobe. 2. No evidence for metastatic disease in the abdomen or pelvis. 3. Nonobstructing right renal stone. 4. Edema and stranding around the colonic anastomosis is decreased in the interval.  Ct chest wo contrast 04/05/2016 IMPRESSION: Progressive radiation changes in posterior right upper lobe and superior right lower lobe.  12 mm nodular opacity in the superior segment right lower lobe, mildly increased,  indeterminate.  Scattered left lung nodules measuring up to 8 mm, suspicious for metastases, mildly progressed.   ASSESSMENT & PLAN:  55 year old male, without significant past medical history except kidney stone, presented with intermittent bloody stool for 2 years, and colonoscopy showed a large sigmoid colon mass, CT scan showed multiple (at least 4) nodules in bilateral lungs, measuring about 1 cm.  1. Sigmoid colon adenocarcinoma, [id number], probably stage IV with lung mets  -I previously reviewed his colonoscopy, CT scan findings and the biopsy results in great details with patient and his wife. -I reviewed his surgical pathology findings, which showed a residual T2 primary tumor, 3 lymph nodes positive, grade 3 disease, certainly high risk disease.  -We discussed that his disease is likely incurable, and the goal of therapy is  maximum disease control and prolong his life.  -- His tumor does not contain KRAS/NRAS or BRAF mutation, so he would benefit from EGFR antibody, Panitumumab was added on from cycle 4. He received a total of 4 month of chemotherapy which was held per patient's request.He declined more chemo after his colon surgery.  --I personally reviewed his CT scan image from  04/05/2016 and compared with prior CT with pt in person, the previous radiated area in the superior segment right lower lobe has developed infiltrative change, I suspect this could be cancer progression, although radiation pneumonitis is still also possibility, but it would be unusual giving the timeframe after radiation 4- 5 months ago. He also has a few other small lung nodules has slightly increased in size. -Clinically he has persistent, we'll slightly worse dry cough. He completed a course of prednisone, his cough is not typical from radiation pneumonitis. -I discussed with Dr. Lisbeth Renshaw about his recent CT scan findings, he feels his CT scan finding is not very typical for radiation pneumonitis. He will hold on  further SBRT for now. -I strongly encouraged him to restart chemotherapy due to the concern of cancer progression in his lungs. -However he is very reluctant to take chemotherapy and panitumumab due to the concern of side effects. -After a long discussion with patient, and with the encouragement from his wife, he finally agreed to start chemotherapy -Due to his prior poor tolerance to oxaliplatin and panitumumab, I recommend his to take FOLFIRI and avastin. He agreed, but refused to have all treatment on same day, I am ok to give Avastin on a different day  --Chemotherapy consent: Side effects including but does not not limited to, fatigue, nausea, vomiting, diarrhea, hair loss, neuropathy, fluid retention, renal and kidney dysfunction, neutropenic fever, needed for blood transfusion, bleeding, coronary artery spasm and heart attack, were discussed with patient in great detail. He repeatedly asked many questions about potential side effects, and I answered his questions to the best of my knowledge. After another lengthy discussion, he finally agreed to proceed. -will start with Irinotecan only today, with 10% dose reduction due to patient's fear of side effects. If he tolerates well, he is agreeable to do FOLFIRI in 2 weeks -Due to the pending port placement next week, Avastin has been held. I'll plan to start Avastin one week after the port placement.   2. Dry cough -His initial cough is likely related to his SBRT,he received a course of prednisone  -I'm concerned he is dry cough to be related to his cancer progression in the lung  -I refilled his Hycodan   3. Obesity  -I encouraged him to eat healthy and exercise   Plan -Irinotecan only today, with 10% dose reduction -port placement next week, I spoke with Dr. Zella Richer  -Return to clinic in 2 weeks for FOLFIRI, he wills tart Avastin on 10/30  -I called in Zofran, Compazine, and EMLA cream today  I spent 25 minutes counseling the patient  face to face. The total time spent in the appointment was 30 minutes and more than 50% was on counseling.     Truitt Merle, MD 04/25/2016

## 2016-04-25 NOTE — Progress Notes (Signed)
Oncology Nurse Navigator Documentation  Oncology Nurse Navigator Flowsheets 04/25/2016  Navigator Location [facility]  Referral date to RadOnc/MedOnc -  Navigator Encounter Type Treatment  Treatment Initiated Date -  Patient Visit Type MedOnc  Treatment Phase Active Tx--FOLFIRI/Avastin (2nd line)  Barriers/Navigation Needs No barriers at this time;No Questions;No Needs  Education -  Interventions None required  Referrals -  Education Method -  Support Groups/Services GI Support Group--declined need for any support services  Acuity -  Time Spent with Patient 15  Expressed he is unhappy that he his having to resume chemo again. Still carries out normal activities and working at [employer].

## 2016-04-26 LAB — CEA: CEA1: 1.6 ng/mL (ref 0.0–4.7)

## 2016-05-02 ENCOUNTER — Telehealth: Payer: Self-pay

## 2016-05-02 NOTE — Telephone Encounter (Signed)
Faxed FMLA papers to [facility]

## 2016-05-06 ENCOUNTER — Ambulatory Visit: Payer: Self-pay | Admitting: General Surgery

## 2016-05-06 ENCOUNTER — Telehealth: Payer: Self-pay | Admitting: *Deleted

## 2016-05-06 ENCOUNTER — Ambulatory Visit: Payer: BLUE CROSS/BLUE SHIELD

## 2016-05-06 ENCOUNTER — Telehealth: Payer: Self-pay | Admitting: Hematology

## 2016-05-06 ENCOUNTER — Other Ambulatory Visit: Payer: Self-pay | Admitting: *Deleted

## 2016-05-06 NOTE — Telephone Encounter (Signed)
Called pt back after hearing that he had talked with Triage RN/Lashonya today & informed to not come in for avastin today & will check with Dr Zella Richer regarding port placement.  Called office & they didn't know anything about port placement.  Dr Burr Medico will check with Dr Zella Richer.

## 2016-05-06 NOTE — Telephone Encounter (Signed)
Left message re 11/2 appointments.

## 2016-05-07 ENCOUNTER — Encounter (HOSPITAL_COMMUNITY): Payer: Self-pay | Admitting: *Deleted

## 2016-05-08 ENCOUNTER — Ambulatory Visit (HOSPITAL_COMMUNITY)
Admission: RE | Admit: 2016-05-08 | Discharge: 2016-05-08 | Disposition: A | Discharge status: Home / Self Care | Payer: BLUE CROSS/BLUE SHIELD | Source: Ambulatory Visit | Attending: General Surgery | Admitting: General Surgery

## 2016-05-08 ENCOUNTER — Encounter: Payer: Self-pay | Admitting: Pharmacist

## 2016-05-08 ENCOUNTER — Ambulatory Visit (HOSPITAL_COMMUNITY): Payer: BLUE CROSS/BLUE SHIELD

## 2016-05-08 ENCOUNTER — Ambulatory Visit (HOSPITAL_COMMUNITY): Payer: BLUE CROSS/BLUE SHIELD | Admitting: Anesthesiology

## 2016-05-08 ENCOUNTER — Encounter (HOSPITAL_COMMUNITY)
Admission: RE | Disposition: A | Discharge status: Home / Self Care | Payer: Self-pay | Source: Ambulatory Visit | Attending: General Surgery

## 2016-05-08 ENCOUNTER — Encounter (HOSPITAL_COMMUNITY): Payer: Self-pay

## 2016-05-08 DIAGNOSIS — C189 Malignant neoplasm of colon, unspecified: Secondary | ICD-10-CM | POA: Diagnosis present

## 2016-05-08 DIAGNOSIS — Z803 Family history of malignant neoplasm of breast: Secondary | ICD-10-CM | POA: Insufficient documentation

## 2016-05-08 DIAGNOSIS — Z9221 Personal history of antineoplastic chemotherapy: Secondary | ICD-10-CM | POA: Diagnosis not present

## 2016-05-08 DIAGNOSIS — Z87442 Personal history of urinary calculi: Secondary | ICD-10-CM | POA: Diagnosis not present

## 2016-05-08 DIAGNOSIS — F419 Anxiety disorder, unspecified: Secondary | ICD-10-CM | POA: Insufficient documentation

## 2016-05-08 DIAGNOSIS — Z95828 Presence of other vascular implants and grafts: Secondary | ICD-10-CM

## 2016-05-08 DIAGNOSIS — Z823 Family history of stroke: Secondary | ICD-10-CM | POA: Insufficient documentation

## 2016-05-08 DIAGNOSIS — E7800 Pure hypercholesterolemia, unspecified: Secondary | ICD-10-CM | POA: Insufficient documentation

## 2016-05-08 DIAGNOSIS — Z833 Family history of diabetes mellitus: Secondary | ICD-10-CM | POA: Insufficient documentation

## 2016-05-08 HISTORY — PX: PORTACATH PLACEMENT: SHX2246

## 2016-05-08 SURGERY — INSERTION, TUNNELED CENTRAL VENOUS DEVICE, WITH PORT
Anesthesia: Monitor Anesthesia Care

## 2016-05-08 MED ORDER — CHLORHEXIDINE GLUCONATE CLOTH 2 % EX PADS
6.0000 | MEDICATED_PAD | Freq: Once | CUTANEOUS | Status: DC
Start: 2016-05-08 — End: 2016-05-08

## 2016-05-08 MED ORDER — CEFAZOLIN SODIUM-DEXTROSE 2-4 GM/100ML-% IV SOLN
INTRAVENOUS | Status: AC
  Filled 2016-05-08: qty 100

## 2016-05-08 MED ORDER — LACTATED RINGERS IV SOLN
INTRAVENOUS | Status: DC | PRN
  Administered 2016-05-08 (×2): via INTRAVENOUS

## 2016-05-08 MED ORDER — 0.9 % SODIUM CHLORIDE (POUR BTL) OPTIME
TOPICAL | Status: DC | PRN
  Administered 2016-05-08: 1000 mL

## 2016-05-08 MED ORDER — DEXAMETHASONE SODIUM PHOSPHATE 10 MG/ML IJ SOLN
INTRAMUSCULAR | Status: DC | PRN
  Administered 2016-05-08: 10 mg via INTRAVENOUS

## 2016-05-08 MED ORDER — LIDOCAINE 2% (20 MG/ML) 5 ML SYRINGE
INTRAMUSCULAR | Status: DC | PRN
  Administered 2016-05-08: 100 mg via INTRAVENOUS

## 2016-05-08 MED ORDER — MIDAZOLAM HCL 2 MG/2ML IJ SOLN
INTRAMUSCULAR | Status: AC
  Filled 2016-05-08: qty 2

## 2016-05-08 MED ORDER — LIDOCAINE-EPINEPHRINE 1 %-1:100000 IJ SOLN
INTRAMUSCULAR | Status: DC | PRN
  Administered 2016-05-08: 4 mL

## 2016-05-08 MED ORDER — DEXAMETHASONE SODIUM PHOSPHATE 10 MG/ML IJ SOLN
INTRAMUSCULAR | Status: AC
  Filled 2016-05-08: qty 1

## 2016-05-08 MED ORDER — PROPOFOL 10 MG/ML IV BOLUS
INTRAVENOUS | Status: AC
  Filled 2016-05-08: qty 40

## 2016-05-08 MED ORDER — PROPOFOL 10 MG/ML IV BOLUS
INTRAVENOUS | Status: DC | PRN
  Administered 2016-05-08: 200 mg via INTRAVENOUS

## 2016-05-08 MED ORDER — HEPARIN SOD (PORK) LOCK FLUSH 100 UNIT/ML IV SOLN
INTRAVENOUS | Status: DC | PRN
  Administered 2016-05-08: 500 [IU] via INTRAVENOUS

## 2016-05-08 MED ORDER — HYDROMORPHONE HCL 1 MG/ML IJ SOLN
0.2500 mg | INTRAMUSCULAR | Status: DC | PRN
Start: 2016-05-08 — End: 2016-05-08

## 2016-05-08 MED ORDER — ONDANSETRON HCL 4 MG/2ML IJ SOLN
INTRAMUSCULAR | Status: AC
  Filled 2016-05-08: qty 2

## 2016-05-08 MED ORDER — FENTANYL CITRATE (PF) 100 MCG/2ML IJ SOLN
INTRAMUSCULAR | Status: AC
  Filled 2016-05-08: qty 2

## 2016-05-08 MED ORDER — PROMETHAZINE HCL 25 MG/ML IJ SOLN: 6.2500 mg | INTRAMUSCULAR | Status: DC | PRN

## 2016-05-08 MED ORDER — HYDROCODONE-ACETAMINOPHEN 5-325 MG PO TABS: 1.0000 | ORAL_TABLET | ORAL | 0 refills | Status: DC | PRN

## 2016-05-08 MED ORDER — LIDOCAINE-EPINEPHRINE 1 %-1:100000 IJ SOLN
INTRAMUSCULAR | Status: AC
  Filled 2016-05-08: qty 1

## 2016-05-08 MED ORDER — PHENYLEPHRINE 40 MCG/ML (10ML) SYRINGE FOR IV PUSH (FOR BLOOD PRESSURE SUPPORT)
PREFILLED_SYRINGE | INTRAVENOUS | Status: AC
  Filled 2016-05-08: qty 10

## 2016-05-08 MED ORDER — PHENYLEPHRINE 40 MCG/ML (10ML) SYRINGE FOR IV PUSH (FOR BLOOD PRESSURE SUPPORT)
PREFILLED_SYRINGE | INTRAVENOUS | Status: DC | PRN
  Administered 2016-05-08: 120 ug via INTRAVENOUS
  Administered 2016-05-08: 80 ug via INTRAVENOUS
  Administered 2016-05-08: 120 ug via INTRAVENOUS
  Administered 2016-05-08: 40 ug via INTRAVENOUS
  Administered 2016-05-08: 80 ug via INTRAVENOUS
  Administered 2016-05-08: 120 ug via INTRAVENOUS
  Administered 2016-05-08: 40 ug via INTRAVENOUS

## 2016-05-08 MED ORDER — LACTATED RINGERS IV SOLN: INTRAVENOUS | Status: DC

## 2016-05-08 MED ORDER — EPHEDRINE 5 MG/ML INJ
INTRAVENOUS | Status: AC
  Filled 2016-05-08: qty 10

## 2016-05-08 MED ORDER — ONDANSETRON HCL 4 MG/2ML IJ SOLN
INTRAMUSCULAR | Status: DC | PRN
  Administered 2016-05-08 (×2): 4 mg via INTRAVENOUS

## 2016-05-08 MED ORDER — MIDAZOLAM HCL 5 MG/5ML IJ SOLN
INTRAMUSCULAR | Status: DC | PRN
  Administered 2016-05-08: 2 mg via INTRAVENOUS

## 2016-05-08 MED ORDER — SODIUM CHLORIDE 0.9 % IV SOLN
Freq: Once | INTRAVENOUS | Status: AC
  Administered 2016-05-08: 100 mL
  Filled 2016-05-08: qty 1.2

## 2016-05-08 MED ORDER — LIDOCAINE 2% (20 MG/ML) 5 ML SYRINGE
INTRAMUSCULAR | Status: AC
  Filled 2016-05-08: qty 5

## 2016-05-08 MED ORDER — HEPARIN SOD (PORK) LOCK FLUSH 100 UNIT/ML IV SOLN
INTRAVENOUS | Status: AC
  Filled 2016-05-08: qty 5

## 2016-05-08 MED ORDER — CEFAZOLIN SODIUM-DEXTROSE 2-4 GM/100ML-% IV SOLN
2.0000 g | INTRAVENOUS | Status: AC
  Administered 2016-05-08: 2 g via INTRAVENOUS
  Filled 2016-05-08: qty 100

## 2016-05-08 MED ORDER — CHLORHEXIDINE GLUCONATE CLOTH 2 % EX PADS: 6.0000 | MEDICATED_PAD | Freq: Once | CUTANEOUS | Status: DC

## 2016-05-08 MED ORDER — FENTANYL CITRATE (PF) 100 MCG/2ML IJ SOLN
INTRAMUSCULAR | Status: DC | PRN
  Administered 2016-05-08: 25 ug via INTRAVENOUS
  Administered 2016-05-08: 100 ug via INTRAVENOUS
  Administered 2016-05-08: 50 ug via INTRAVENOUS
  Administered 2016-05-08: 25 ug via INTRAVENOUS

## 2016-05-08 SURGICAL SUPPLY — 38 items
BAG DECANTER FOR FLEXI CONT (MISCELLANEOUS) ×3 IMPLANT
BENZOIN TINCTURE PRP APPL 2/3 (GAUZE/BANDAGES/DRESSINGS) ×3 IMPLANT
BLADE HEX COATED 2.75 (ELECTRODE) ×3 IMPLANT
BLADE SURG 15 STRL LF DISP TIS (BLADE) ×1 IMPLANT
BLADE SURG 15 STRL SS (BLADE) ×2
BLADE SURG SZ11 CARB STEEL (BLADE) ×3 IMPLANT
CHLORAPREP W/TINT 26ML (MISCELLANEOUS) ×3 IMPLANT
CLOSURE WOUND 1/2 X4 (GAUZE/BANDAGES/DRESSINGS) ×1
COVER SURGICAL LIGHT HANDLE (MISCELLANEOUS) ×3 IMPLANT
DECANTER SPIKE VIAL GLASS SM (MISCELLANEOUS) ×3 IMPLANT
DRAPE C-ARM 42X120 X-RAY (DRAPES) ×3 IMPLANT
DRAPE LAPAROSCOPIC ABDOMINAL (DRAPES) ×3 IMPLANT
DRSG TEGADERM 2-3/8X2-3/4 SM (GAUZE/BANDAGES/DRESSINGS) ×3 IMPLANT
DRSG TEGADERM 4X4.75 (GAUZE/BANDAGES/DRESSINGS) ×3 IMPLANT
ELECT PENCIL ROCKER SW 15FT (MISCELLANEOUS) ×3 IMPLANT
ELECT REM PT RETURN 9FT ADLT (ELECTROSURGICAL) ×3
ELECTRODE REM PT RTRN 9FT ADLT (ELECTROSURGICAL) ×1 IMPLANT
GAUZE SPONGE 4X4 12PLY STRL (GAUZE/BANDAGES/DRESSINGS) ×3 IMPLANT
GAUZE SPONGE 4X4 16PLY XRAY LF (GAUZE/BANDAGES/DRESSINGS) ×3 IMPLANT
GLOVE ECLIPSE 8.0 STRL XLNG CF (GLOVE) ×3 IMPLANT
GLOVE INDICATOR 8.0 STRL GRN (GLOVE) ×3 IMPLANT
GOWN STRL REUS W/TWL XL LVL3 (GOWN DISPOSABLE) ×6 IMPLANT
KIT BASIN OR (CUSTOM PROCEDURE TRAY) ×3 IMPLANT
KIT PORT POWER 8FR ISP CVUE (Catheter) ×3 IMPLANT
NEEDLE HYPO 25X1 1.5 SAFETY (NEEDLE) ×3 IMPLANT
NS IRRIG 1000ML POUR BTL (IV SOLUTION) ×3 IMPLANT
PACK BASIC VI WITH GOWN DISP (CUSTOM PROCEDURE TRAY) ×3 IMPLANT
STRIP CLOSURE SKIN 1/2X4 (GAUZE/BANDAGES/DRESSINGS) ×2 IMPLANT
SUT MNCRL AB 4-0 PS2 18 (SUTURE) ×3 IMPLANT
SUT VIC AB 2-0 SH 18 (SUTURE) ×3 IMPLANT
SUT VIC AB 2-0 SH 27 (SUTURE)
SUT VIC AB 2-0 SH 27X BRD (SUTURE) IMPLANT
SUT VIC AB 3-0 SH 27 (SUTURE) ×2
SUT VIC AB 3-0 SH 27XBRD (SUTURE) ×1 IMPLANT
SYR 10ML LL (SYRINGE) ×3 IMPLANT
SYR 20CC LL (SYRINGE) ×3 IMPLANT
TOWEL OR 17X26 10 PK STRL BLUE (TOWEL DISPOSABLE) ×3 IMPLANT
TOWEL OR NON WOVEN STRL DISP B (DISPOSABLE) ×3 IMPLANT

## 2016-05-08 NOTE — H&P (Signed)
Bill Wright is an 55 y.o. male.   Chief Complaint:  He presents for Port-a-cath [facility]) insertion. HPI:  He has stage IV colon cancer and needs long term venous access for chemotherapy.  He now presents for [facility] insertion.  Past Medical History:  Diagnosis Date  . Anxiety    situational due to cancer diagnosis  . Cancer [facility]) 2017   colon-chemo 09/22/15 now surgery  . Hypercholesteremia   . Kidney calculi     Past Surgical History:  Procedure Laterality Date  . COLONOSCOPY  03/30/15  . LAPAROSCOPIC PARTIAL COLECTOMY N/A 11/02/2015   Procedure: LAPAROSCOPIC ASSISTED SIGMOID COLECTOMY;  Surgeon: Jackolyn Confer, MD;  Location: [facility];  Service: General;  Laterality: N/A;    Family History  Problem Relation Age of Onset  . Breast cancer Mother 61    +rad and lymph node  . Diabetes Maternal Grandmother     leading to blindness  . Obesity Maternal Aunt   . Stroke Maternal Uncle 13   Social History:  reports that he has never smoked. He has never used smokeless tobacco. He reports that he does not drink alcohol or use drugs.  Allergies: No Known Allergies  Medications Prior to Admission  Medication Sig Dispense Refill  . HYDROcodone-acetaminophen (NORCO/VICODIN) 5-325 MG tablet Take 1 tablet by mouth every 4 (four) hours as needed. Reported on 01/11/2016 (Patient taking differently: Take 1 tablet by mouth every 4 (four) hours as needed for moderate pain. Reported on 01/11/2016) 30 tablet 0  . HYDROcodone-homatropine (HYCODAN) 5-1.5 MG/5ML syrup Take 5 mLs by mouth every 6 (six) hours as needed for cough. 120 mL 0  . ibuprofen (ADVIL,MOTRIN) 200 MG tablet Take 600 mg by mouth every 6 (six) hours as needed.    . ondansetron (ZOFRAN) 8 MG tablet Take 1 tablet (8 mg total) by mouth 2 (two) times daily as needed for refractory nausea / vomiting. Start on day 3 after chemotherapy. 30 tablet 1  . clindamycin (CLINDAGEL) 1 % gel Apply topically 2 (two) times daily. (Patient not taking: Reported on  05/08/2016) 30 g 5  . hydrocortisone 2.5 % lotion APPLY EXTERNALLY TO THE AFFECTED AREA TWICE DAILY (Patient not taking: Reported on 05/08/2016) 200 mL 0  . lidocaine-prilocaine (EMLA) cream Apply 1 application topically as needed. (Patient not taking: Reported on 05/08/2016) 30 g 1  . prochlorperazine (COMPAZINE) 10 MG tablet Take 1 tablet (10 mg total) by mouth every 6 (six) hours as needed (NAUSEA). (Patient not taking: Reported on 05/08/2016) 30 tablet 1    No results found for this or any previous visit (from the past 48 hour(s)). No results found.  Review of Systems  Gastrointestinal: Negative for abdominal pain, nausea and vomiting.    Blood pressure (!) 146/91, pulse 97, temperature 98.6 F (37 C), temperature source Oral, resp. rate 18, height 5\' 6"  (1.676 m), weight 97.6 kg (215 lb 2 oz), SpO2 99 %. Physical Exam  Constitutional: He is oriented to person, place, and time.  Overweight male in NAD  HENT:  Head: Normocephalic and atraumatic.  Neck:  No masses.  Cardiovascular: Normal rate and regular rhythm.   Respiratory: Effort normal and breath sounds normal.  GI: Soft.  Lower midline scar without hernia or nodules  Neurological: He is alert and oriented to person, place, and time.  Skin: Skin is warm and dry.     Assessment/Plan Stage IV colon cancer.  Plan:  US guided PAC insertion. The procedure risks and aftercare been explained. Risks  include but are not limited to bleeding, infection, malfunction, pneumothorax, wound problems, DVT.  Odis Hollingshead, MD 05/08/2016, 11:02 AM

## 2016-05-08 NOTE — Anesthesia Preprocedure Evaluation (Signed)
Anesthesia Evaluation  Patient identified by MRN, date of birth, ID band Patient awake    Reviewed: Allergy & Precautions, H&P , NPO status , Patient's Chart, lab work & pertinent test results, reviewed documented beta blocker date and time   Airway Mallampati: II  TM Distance: >3 FB Neck ROM: full    Dental no notable dental hx. (+) Teeth Intact, Dental Advisory Given   Pulmonary neg pulmonary ROS,    Pulmonary exam normal breath sounds clear to auscultation       Cardiovascular Exercise Tolerance: Good negative cardio ROS Normal cardiovascular exam Rhythm:regular Rate:Normal     Neuro/Psych PSYCHIATRIC DISORDERS Anxiety negative neurological ROS     GI/Hepatic Neg liver ROS, Colon cancer   Endo/Other  negative endocrine ROS  Renal/GU negative Renal ROS  negative genitourinary   Musculoskeletal   Abdominal   Peds  Hematology negative hematology ROS (+)   Anesthesia Other Findings   Reproductive/Obstetrics negative OB ROS                             Anesthesia Physical  Anesthesia Plan  ASA: II  Anesthesia Plan: MAC   Post-op Pain Management:    Induction: Intravenous  Airway Management Planned: Natural Airway and Simple Face Mask  Additional Equipment:   Intra-op Plan:   Post-operative Plan: Extubation in OR  Informed Consent: I have reviewed the patients History and Physical, chart, labs and discussed the procedure including the risks, benefits and alternatives for the proposed anesthesia with the patient or authorized representative who has indicated his/her understanding and acceptance.   Dental Advisory Given  Plan Discussed with: CRNA, Surgeon and Anesthesiologist  Anesthesia Plan Comments:         Anesthesia Quick Evaluation

## 2016-05-08 NOTE — Interval H&P Note (Signed)
History and Physical Interval Note:  05/08/2016 11:05 AM  Bill Wright  has presented today for surgery, with the diagnosis of colon cancer  The various methods of treatment have been discussed with the patient and family. After consideration of risks, benefits and other options for treatment, the patient has consented to  Procedure(s): INSERTION PORT-A-CATH (N/A) as a surgical intervention .  The patient's history has been reviewed, patient examined, no change in status, stable for surgery.  I have reviewed the patient's chart and labs.  Questions were answered to the patient's satisfaction.     Nabeel Gladson Lenna Sciara

## 2016-05-08 NOTE — Op Note (Signed)
OPERATIVE NOTE- Porta-a-cath insertion  Preoperative diagnosis:  Stage IV colon cancer   Postoperative diagnosis:  Same  Procedure: Ultrasound-guided Port-A-Cath insertion into right internal jugular vein under fluoroscopy.  Surgeon: Jackolyn Confer M.D.  Anesthesia: General/LMA with local (Xylocaine) .  Indication:  This is a 55 year old male with stage IV colon cancer who needs long-term venous access for chemotherapy.  Technique: He was brought to the operating room, placed supine on the operating table, and intravenous sedation was given. A roll was placed under the back and the arms were tucked. Hair on the chest wall was clipped as was necessary. The upper chest wall and neck were sterilely prepped and draped.  His/Her head was rotated to the left. Using the ultrasound, the right internal jugular vein was identified. Local anesthetic was infiltrated in the skin and subcutaneous tissue just anterior to it. A 16-gauge needle was used to cannulate the right internal jugular vein under ultrasound guidance. A wire was then threaded through the needle into the internal jugular vein and down into the right heart under ultrasound and fluoroscopic guidance.  Local anesthetic was infiltrated into the right upper chest wall. A right upper chest wall incision was made and a pocket was created for the Portacath.  An incision was made around the wire in the neck. The catheter was then tunneled from the chest wall incision up through the neck incision.  A dilator- introducer complex was placed over the wire into the superior vena cava. The dilator and wire were then removed and the catheter was threaded through the peel-away sheath introducer into the right heart. The introducer was then peeled away and removed. Under fluoroscopic guidance, the tip of the catheter was then pulled back until it was at the junction of the superior vena cava and right atrium. The catheter was then connected to the port.  The  port aspirated blood and flushed easily.  The port was then anchored to the chest wall with 2-0 Prolene suture. Concentrated heparin solution was then placed into the port via a [place] access needle which was left in. The port and catheter position were  verified using fluoroscopy.  The subcutaneous tissue was then closed over the port with running 3-0 Vicryl suture. The skin incisions were then closed with 4-0 Monocryl subcuticular stitches. Steri-Strips and sterile dressings were applied.  The procedure was well-tolerated without any apparent complications. He was taken to the recovery room in satisfactory condition where a portable chest x-ray is pending.

## 2016-05-08 NOTE — Discharge Instructions (Signed)
° ° °  PORT-A-CATH: POST OP INSTRUCTIONS  Always review your discharge instruction sheet given to you by the facility where your surgery was performed.   1. A prescription for pain medication may be given to you upon discharge. Take your pain medication as prescribed, if needed. If narcotic pain medicine is not needed, then you make take acetaminophen (Tylenol) or ibuprofen (Advil) as needed.  2. Take your usually prescribed medications unless otherwise directed. 3. If you need a refill on your pain medication, please contact our office. All narcotic pain medicine now requires a paper prescription.  Phoned in and fax refills are no longer allowed by law.  Prescriptions will not be filled after 5 pm or on weekends.  4. You should follow a light diet for the remainder of the day after your procedure. 5. Most patients will experience some mild swelling and/or bruising in the area of the incision. It may take several days to resolve. 6. It is common to experience some constipation if taking pain medication after surgery. Increasing fluid intake and taking a stool softener (such as Colace) will usually help or prevent this problem from occurring. A mild laxative (Milk of Magnesia or Miralax) should be taken according to package directions if there are no bowel movements after 48 hours.  7. Unless discharge instructions indicate otherwise, you may remove your bandages 72 hours after surgery, and you may shower at that time. You may have steri-strips (small white skin tapes) in place directly over the incision.  These strips should be left on the skin for 14 days.  If your surgeon used Dermabond (skin glue) on the incision, you may shower in 24 hours.  The glue will flake off over the next 2-3 weeks.  8. If your port is left accessed at the end of surgery (needle left in port), the dressing cannot get wet and should only by changed by a healthcare professional. When the port is no longer accessed (when the needle  has been removed), follow step 7.   9. ACTIVITIES:  Limit activity involving your right arms for the next week.. Do no strenuous exercise or activity for 1 week. You may drive when you are no longer taking prescription pain medication, you can comfortably wear a seatbelt, and you can maneuver your car. 10.You may need to see your doctor in the office for a follow-up appointment.  Please       check with your doctor.  11.When you receive a new Port-a-Cath, you will get a product guide and        ID card.  Please keep them in case you need them.  WHEN TO CALL YOUR DOCTOR [phone]): 1. Fever over 101.0 2. Chills 3. Continued bleeding from incision 4. Increased redness and tenderness at the site 5. Shortness of breath, difficulty breathing   The clinic staff is available to answer your questions during regular business hours. Please dont hesitate to call and ask to speak to one of the nurses or medical assistants for clinical concerns. If you have a medical emergency, go to the nearest emergency room or call 911.  A surgeon from [facility] is always on call at the hospital.     For further information, please visit [email or web address]

## 2016-05-08 NOTE — Progress Notes (Signed)
Updated wife and patient about delay in surgery.

## 2016-05-08 NOTE — Anesthesia Procedure Notes (Signed)
Procedure Name: LMA Insertion Date/Time: 05/08/2016 11:30 AM Performed by: Talbot Grumbling Pre-anesthesia Checklist: Patient identified, Emergency Drugs available, Suction available and Patient being monitored Patient Re-evaluated:Patient Re-evaluated prior to inductionOxygen Delivery Method: Circle system utilized Preoxygenation: Pre-oxygenation with 100% oxygen Intubation Type: IV induction Ventilation: Mask ventilation without difficulty LMA: LMA inserted LMA Size: 5.0 Number of attempts: 1 Placement Confirmation: positive ETCO2 and breath sounds checked- equal and bilateral Tube secured with: Tape Dental Injury: Teeth and Oropharynx as per pre-operative assessment

## 2016-05-08 NOTE — Transfer of Care (Signed)
Immediate Anesthesia Transfer of Care Note  Patient: Bill Wright  Procedure(s) Performed: Procedure(s): INSERTION PORT-A-CATH (N/A)  Patient Location: PACU  Anesthesia Type:General  Level of Consciousness:  sedated, patient cooperative and responds to stimulation  Airway & Oxygen Therapy:Patient Spontanous Breathing and Patient connected to face mask oxgen  Post-op Assessment:  Report given to PACU RN and Post -op Vital signs reviewed and stable  Post vital signs:  Reviewed and stable  Last Vitals:  Vitals:   05/08/16 0822  BP: (!) 146/91  Pulse: 97  Resp: 18  Temp: 37 C    Complications: No apparent anesthesia complications

## 2016-05-08 NOTE — Anesthesia Postprocedure Evaluation (Signed)
Anesthesia Post Note  Patient: Bill Wright  Procedure(s) Performed: Procedure(s) (LRB): INSERTION PORT-A-CATH (N/A)  Patient location during evaluation: PACU Anesthesia Type: MAC Level of consciousness: awake and alert Pain management: pain level controlled Vital Signs Assessment: post-procedure vital signs reviewed and stable Respiratory status: spontaneous breathing and respiratory function stable Cardiovascular status: stable Anesthetic complications: no    Last Vitals:  Vitals:   05/08/16 1300 05/08/16 1315  BP: (!) 141/86 131/78  Pulse: 87 91  Resp: 19 12  Temp:  36.8 C    Last Pain:  Vitals:   05/08/16 1315  TempSrc:   PainSc: 0-No pain                 Paidyn Mcferran DANIEL

## 2016-05-09 ENCOUNTER — Ambulatory Visit (HOSPITAL_BASED_OUTPATIENT_CLINIC_OR_DEPARTMENT_OTHER): Payer: BLUE CROSS/BLUE SHIELD | Admitting: Hematology

## 2016-05-09 ENCOUNTER — Other Ambulatory Visit (HOSPITAL_BASED_OUTPATIENT_CLINIC_OR_DEPARTMENT_OTHER): Payer: BLUE CROSS/BLUE SHIELD

## 2016-05-09 ENCOUNTER — Ambulatory Visit (HOSPITAL_BASED_OUTPATIENT_CLINIC_OR_DEPARTMENT_OTHER): Payer: BLUE CROSS/BLUE SHIELD

## 2016-05-09 ENCOUNTER — Telehealth: Payer: Self-pay | Admitting: Hematology

## 2016-05-09 ENCOUNTER — Ambulatory Visit: Payer: BLUE CROSS/BLUE SHIELD | Admitting: Nurse Practitioner

## 2016-05-09 ENCOUNTER — Telehealth: Payer: Self-pay | Admitting: *Deleted

## 2016-05-09 ENCOUNTER — Encounter: Payer: Self-pay | Admitting: Hematology

## 2016-05-09 VITALS — BP 131/74 | HR 89 | Temp 98.7°F | Resp 18

## 2016-05-09 DIAGNOSIS — C187 Malignant neoplasm of sigmoid colon: Secondary | ICD-10-CM

## 2016-05-09 DIAGNOSIS — R918 Other nonspecific abnormal finding of lung field: Secondary | ICD-10-CM | POA: Diagnosis not present

## 2016-05-09 DIAGNOSIS — C189 Malignant neoplasm of colon, unspecified: Secondary | ICD-10-CM

## 2016-05-09 DIAGNOSIS — C7800 Secondary malignant neoplasm of unspecified lung: Principal | ICD-10-CM

## 2016-05-09 DIAGNOSIS — C772 Secondary and unspecified malignant neoplasm of intra-abdominal lymph nodes: Secondary | ICD-10-CM | POA: Diagnosis not present

## 2016-05-09 DIAGNOSIS — Z5111 Encounter for antineoplastic chemotherapy: Secondary | ICD-10-CM | POA: Diagnosis not present

## 2016-05-09 DIAGNOSIS — Z95828 Presence of other vascular implants and grafts: Secondary | ICD-10-CM

## 2016-05-09 DIAGNOSIS — L270 Generalized skin eruption due to drugs and medicaments taken internally: Secondary | ICD-10-CM

## 2016-05-09 LAB — CBC WITH DIFFERENTIAL/PLATELET
BASO%: 0 % (ref 0.0–2.0)
BASOS ABS: 0 10*3/uL (ref 0.0–0.1)
EOS ABS: 0 10*3/uL (ref 0.0–0.5)
EOS%: 0 % (ref 0.0–7.0)
HCT: 34.7 % — ABNORMAL LOW (ref 38.4–49.9)
HEMOGLOBIN: 11.4 g/dL — AB (ref 13.0–17.1)
LYMPH#: 0.8 10*3/uL — AB (ref 0.9–3.3)
LYMPH%: 5.9 % — ABNORMAL LOW (ref 14.0–49.0)
MCH: 28.5 pg (ref 27.2–33.4)
MCHC: 32.9 g/dL (ref 32.0–36.0)
MCV: 86.7 fL (ref 79.3–98.0)
MONO#: 1.7 10*3/uL — AB (ref 0.1–0.9)
MONO%: 13.4 % (ref 0.0–14.0)
NEUT%: 80.7 % — ABNORMAL HIGH (ref 39.0–75.0)
NEUTROS ABS: 10.3 10*3/uL — AB (ref 1.5–6.5)
PLATELETS: 265 10*3/uL (ref 140–400)
RBC: 4 10*6/uL — ABNORMAL LOW (ref 4.20–5.82)
RDW: 15.1 % — ABNORMAL HIGH (ref 11.0–14.6)
WBC: 12.7 10*3/uL — ABNORMAL HIGH (ref 4.0–10.3)

## 2016-05-09 LAB — COMPREHENSIVE METABOLIC PANEL
ALBUMIN: 3.3 g/dL — AB (ref 3.5–5.0)
ALK PHOS: 102 U/L (ref 40–150)
ALT: 26 U/L (ref 0–55)
ANION GAP: 9 meq/L (ref 3–11)
AST: 14 U/L (ref 5–34)
BILIRUBIN TOTAL: 0.26 mg/dL (ref 0.20–1.20)
BUN: 13.2 mg/dL (ref 7.0–26.0)
CALCIUM: 9 mg/dL (ref 8.4–10.4)
CO2: 24 mEq/L (ref 22–29)
Chloride: 106 mEq/L (ref 98–109)
Creatinine: 0.8 mg/dL (ref 0.7–1.3)
Glucose: 173 mg/dl — ABNORMAL HIGH (ref 70–140)
Potassium: 4.3 mEq/L (ref 3.5–5.1)
Sodium: 138 mEq/L (ref 136–145)
TOTAL PROTEIN: 6.6 g/dL (ref 6.4–8.3)

## 2016-05-09 MED ORDER — ATROPINE SULFATE 1 MG/ML IJ SOLN
INTRAMUSCULAR | Status: AC
  Filled 2016-05-09: qty 1

## 2016-05-09 MED ORDER — PALONOSETRON HCL INJECTION 0.25 MG/5ML
INTRAVENOUS | Status: AC
  Filled 2016-05-09: qty 5

## 2016-05-09 MED ORDER — SODIUM CHLORIDE 0.9 % IV SOLN
2400.0000 mg/m2 | INTRAVENOUS | Status: DC
  Administered 2016-05-09: 5100 mg via INTRAVENOUS
  Filled 2016-05-09: qty 102

## 2016-05-09 MED ORDER — SODIUM CHLORIDE 0.9% FLUSH
10.0000 mL | INTRAVENOUS | Status: DC | PRN
  Filled 2016-05-09: qty 10

## 2016-05-09 MED ORDER — IRINOTECAN HCL CHEMO INJECTION 100 MG/5ML
180.0000 mg/m2 | Freq: Once | INTRAVENOUS | Status: AC
  Administered 2016-05-09: 380 mg via INTRAVENOUS
  Filled 2016-05-09: qty 15

## 2016-05-09 MED ORDER — DEXAMETHASONE SODIUM PHOSPHATE 10 MG/ML IJ SOLN
INTRAMUSCULAR | Status: AC
  Filled 2016-05-09: qty 1

## 2016-05-09 MED ORDER — ATROPINE SULFATE 1 MG/ML IJ SOLN
0.5000 mg | Freq: Once | INTRAMUSCULAR | Status: AC | PRN
  Administered 2016-05-09: 0.5 mg via INTRAVENOUS

## 2016-05-09 MED ORDER — ALPRAZOLAM 0.25 MG PO TABS: 0.2500 mg | ORAL_TABLET | Freq: Three times a day (TID) | ORAL | 0 refills | Status: DC | PRN

## 2016-05-09 MED ORDER — SODIUM CHLORIDE 0.9 % IV SOLN
Freq: Once | INTRAVENOUS | Status: AC
  Administered 2016-05-09: 12:00:00 via INTRAVENOUS

## 2016-05-09 MED ORDER — PALONOSETRON HCL INJECTION 0.25 MG/5ML
0.2500 mg | Freq: Once | INTRAVENOUS | Status: AC
  Administered 2016-05-09: 0.25 mg via INTRAVENOUS

## 2016-05-09 MED ORDER — SODIUM CHLORIDE 0.9% FLUSH
10.0000 mL | INTRAVENOUS | Status: DC | PRN
  Administered 2016-05-09: 10 mL via INTRAVENOUS
  Filled 2016-05-09: qty 10

## 2016-05-09 MED ORDER — DEXAMETHASONE SODIUM PHOSPHATE 10 MG/ML IJ SOLN
10.0000 mg | Freq: Once | INTRAMUSCULAR | Status: AC
  Administered 2016-05-09: 10 mg via INTRAVENOUS

## 2016-05-09 MED ORDER — LEUCOVORIN CALCIUM INJECTION 350 MG
400.0000 mg/m2 | Freq: Once | INTRAVENOUS | Status: AC
  Administered 2016-05-09: 848 mg via INTRAVENOUS
  Filled 2016-05-09: qty 42.4

## 2016-05-09 MED ORDER — HEPARIN SOD (PORK) LOCK FLUSH 100 UNIT/ML IV SOLN
500.0000 [IU] | Freq: Once | INTRAVENOUS | Status: DC | PRN
  Filled 2016-05-09: qty 5

## 2016-05-09 NOTE — Progress Notes (Signed)
[facility]  [phone] [phone]  Clinic Follow Up Note   Patient Care Team: Dibas Koirala, MD as PCP - General (Family Medicine) Wilford Corner, MD as Consulting Physician (Gastroenterology) Jackolyn Confer, MD as Consulting Physician (General Surgery) Truitt Merle, MD as Consulting Physician (Hematology) 05/09/2016  CHIEF COMPLAINTS:  Follow Up colon cancer  Oncology History   T2, Colon cancer metastasized to lung [facility])   Staging form: Colon and Rectum, AJCC 7th Edition     Clinical stage from 03/30/2015: Stage Unknown ([place], N1, M1) - Unsigned       Colon cancer metastasized to lung [facility]) s/p laparoscopic assisted sigmoid colectomy 11/02/15   03/30/2015 Miscellaneous    Foundation one genomic testing showed TP53 mutation, MSI stable, low tumor mutation burden. Negative for K-ras, NRAS and BRAF      03/30/2015 Initial Biopsy    Sigmoid mass biopsy showed invasive adenocarcinoma. Cecal colon polyps showed tubular adenoma.      03/30/2015 Initial Diagnosis    Colon cancer      03/30/2015 Procedure    colonoscopy by Dr. Michail Sermon showed a fungating, infiltrative and ulcerated nonobstructing large mass in the sigmoid colon and at 20 cm proximal to the anus. The mass was partially circumferential no bleeding. A 10 mm polyps in the cecum was removed.      04/03/2015 Imaging    CT chest, abdomen and pelvis with contrast showed nodular masslike area of clinical worsening at rectosigmoid junction, tiny pericolonic lymph nodes, bilateral pulmonary nodules measuring about 1 cm.      04/12/2015 PET scan    Hypermetabolic colonic mass near rectosigmoid junction, tiny subcentimeter paracolonic lymph nodes. Bilateral pulmonary metastasis.      04/19/2015 Procedure    CT-guided lung nodule biopsy attempted, unsuccessful.      04/27/2015 - 09/14/2015 Chemotherapy    Oxaliplatin 130 mg/m on day 1, Capecitabine 2345m q12hr, 2 weeks on and one week off  (only received 7 days for first cycle), oxaliplatin held on cycle 5 and dose reduced to 1030mm2, capecitabine reduced to 200014m12h D1-14, stopped per pt's request.       06/29/2015 - 10/19/2015 Chemotherapy     Panitumumab every 2 weeks, some cycles were postponed due to pt's request, stoppe per pt's request       09/18/2015 Imaging    Pulmonary nodules are less hypermetabolic, stable size. Stable hypermetabolic portal hepatis and abdominal peritoneal ligament lymph nodes. No other new lesions.       11/02/2015 Surgery    sigmoid colon segmental resection       11/02/2015 Pathology Results    Sigmoid colon segmental resection showed adenocarcinoma, grade 3,  T2, 3 out of 13 lymph nodes were positive, surgical margins were negative. LVI(-), perineural invasion negative      11/13/2015 Imaging    CT chest, abdomen and pelvis with contrast showed postsurgical changes, mild progression of pulmonary metastasis, measuring up to 15 mm in the right lower lobe.      11/29/2015 - 12/05/2015 Radiation Therapy    SBRT to 4 lung lesions in 3 sessions       04/25/2016 -  Chemotherapy    FOLFIRI every 2 weeks, and Avastin added from cycle 3         HISTORY OF PRESENTING ILLNESS:  Bill Wright 55o. male is here because of recently newly diagnosed colon cancer.  He has had intermittent bloody stool for 2 years, it has been mild, mixed with stool,  patient does not have any abdominal pain, constipation, change of his bowel habits, nausea, weight loss or other symptoms. He did not seek medical attention for this. He went to emergency room on 03/15/2015 for right flank pain, due to his kidney stone. CT scan incidentally found a 11 mm right lower lobe nodule and mucosal edema in the sigmoid colon. He saw his primary care physician, and was referred to GI Dr. Michail Sermon here at he underwent colonoscopy on 03/30/2015, which showed a fungating infiltrative and ulcerated nonobstructing large mass in the  sigmoid colon, biopsy showed adenocarcinoma. CT chest abdomen and pelvis showed multiple lung nodules measuring about 1 cm. He was referred to surgeon Dr. Zella Richer, who referred patient to [place] for further workup of his lung nodule and discuss chemotherapy.  He feels very well overall, denies any symptoms. 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 He never had screening colonoscopy prior the reason one, no significant past medical history, does not see doctors regularly.  CURRENT TREATMENT: chemotherapy FOLFIRI and Avastin, starting on 04/25/2016   INTERIM HISTORY Bill Wright returns for follow-up and chemo He tolerate first dose of Irinotecan well, mild diarrhea for 2 days, a few episode of mild nausea Eats well, has good energy, continues to work, cough seems better No other concerns Finally had a port placed yesterday, has not started Avastin yet.   MEDICAL HISTORY:  Past Medical History:  Diagnosis Date  . Anxiety    situational due to cancer diagnosis  . Cancer [facility]) 2017   colon-chemo 09/22/15 now surgery  . Hypercholesteremia   . Kidney calculi     SURGICAL HISTORY: Past Surgical History:  Procedure Laterality Date  . COLONOSCOPY  03/30/15  . LAPAROSCOPIC PARTIAL COLECTOMY N/A 11/02/2015   Procedure: LAPAROSCOPIC ASSISTED SIGMOID COLECTOMY;  Surgeon: Jackolyn Confer, MD;  Location: [facility]S;  Service: General;  Laterality: N/A;  . PORTACATH PLACEMENT N/A 05/08/2016   Procedure: INSERTION PORT-A-CATH;  Surgeon: Jackolyn Confer, MD;  Location: [facility];  Service: General;  Laterality: N/A;    SOCIAL HISTORY: Social History   Social History  . Marital Status: Married    Spouse Name: N/A  . Number of Children: 3, age of 17, 49 and 76   . Years of Education: N/A   Occupational History  . Banker for [employer] [place]    Social History Main Topics  . Smoking status: Never Smoker   . Smokeless tobacco: Not on file  . Alcohol Use: No  . Drug Use: No  .  Sexual Activity: Not on file   Other Topics Concern  . Not on file   Social History Narrative    FAMILY HISTORY: Family History  Problem Relation Age of Onset  . Breast cancer Mother 35    +rad and lymph node  . Diabetes Maternal Grandmother     leading to blindness  . Obesity Maternal Aunt   . Stroke Maternal Uncle 65    ALLERGIES:  has No Known Allergies.  MEDICATIONS:  Current Outpatient Prescriptions  Medication Sig Dispense Refill  . ALPRAZolam (XANAX) 0.25 MG tablet Take 1 tablet (0.25 mg total) by mouth 3 (three) times daily as needed for anxiety. 30 tablet 0  . clindamycin (CLINDAGEL) 1 % gel Apply topically 2 (two) times daily. (Patient not taking: Reported on 05/08/2016) 30 g 5  . HYDROcodone-acetaminophen (NORCO/VICODIN) 5-325 MG tablet Take 1-2 tablets by mouth every 4 (four) hours as needed for moderate pain or  severe pain. 15 tablet 0  . hydrocortisone 2.5 % lotion APPLY EXTERNALLY TO THE AFFECTED AREA TWICE DAILY (Patient not taking: Reported on 05/08/2016) 200 mL 0  . ibuprofen (ADVIL,MOTRIN) 200 MG tablet Take 600 mg by mouth every 6 (six) hours as needed.    . lidocaine-prilocaine (EMLA) cream Apply 1 application topically as needed. (Patient not taking: Reported on 05/08/2016) 30 g 1  . ondansetron (ZOFRAN) 8 MG tablet Take 1 tablet (8 mg total) by mouth 2 (two) times daily as needed for refractory nausea / vomiting. Start on day 3 after chemotherapy. 30 tablet 1  . prochlorperazine (COMPAZINE) 10 MG tablet Take 1 tablet (10 mg total) by mouth every 6 (six) hours as needed (NAUSEA). (Patient not taking: Reported on 05/08/2016) 30 tablet 1   No current facility-administered medications for this visit.    Facility-Administered Medications Ordered in Other Visits  Medication Dose Route Frequency Provider Last Rate Last Dose  . fluorouracil (ADRUCIL) 5,100 mg in sodium chloride 0.9 % 148 mL chemo infusion  2,400 mg/m2 (Treatment Plan Recorded) Intravenous 1 day or 1  dose Truitt Merle, MD   5,100 mg at 05/09/16 1508  . heparin lock flush 100 unit/mL  500 Units Intracatheter Once PRN Truitt Merle, MD      . sodium chloride flush (NS) 0.9 % injection 10 mL  10 mL Intracatheter PRN Truitt Merle, MD        REVIEW OF SYSTEMS:   Constitutional: Denies fevers, chills or abnormal night sweats, no weight loss. Eyes: Denies blurriness of vision, double vision or watery eyes Ears, nose, mouth, throat, and face: Denies mucositis or sore throat Respiratory: Denies cough, dyspnea or wheezes Cardiovascular: Denies palpitation, chest discomfort or lower extremity swelling Gastrointestinal:  Denies nausea, heartburn or change in bowel habits Skin: Denies abnormal skin rashes Lymphatics: Denies new lymphadenopathy or easy bruising Neurological:Denies numbness, tingling or new weaknesses Behavioral/Psych: Mood is stable, no new changes  All other systems were reviewed with the patient and are negative.  PHYSICAL EXAMINATION: ECOG PERFORMANCE STATUS: 1 Pressure 131/74, heart rate 89, respiratory rate 18, temperature 98.7, pulse ox 98% on room air GENERAL:alert, no distress and comfortable SKIN: skin color, texture, turgor are normal, diffuse acne-like rash on his front chest and back, scattered rash on his neck and face, there is a healing large boil above his upper lip, no discharge  EYES: normal, conjunctiva are pink and non-injected, sclera clear OROPHARYNX:no exudate, no erythema and lips, buccal mucosa, and tongue normal  NECK: supple, thyroid normal size, non-tender, without nodularity LYMPH:  no palpable lymphadenopathy in the cervical, axillary or inguinal LUNGS: clear to auscultation and percussion with normal breathing effort HEART: regular rate & rhythm and no murmurs and no lower extremity edema ABDOMEN:abdomen soft, non-tender and normal bowel sounds. The midline incision has healed well, no discharge or skin erythema  Musculoskeletal:no cyanosis of digits and no  clubbing  PSYCH: alert & oriented x 3 with fluent speech NEURO: no focal motor/sensory deficits  LABORATORY DATA:  I have reviewed the data as listed CBC Latest Ref Rng & Units 05/09/2016 04/25/2016 02/16/2016  WBC 4.0 - 10.3 10e3/uL 12.7(H) 6.3 8.0  Hemoglobin 13.0 - 17.1 g/dL 11.4(L) 13.3 13.2  Hematocrit 38.4 - 49.9 % 34.7(L) 40.4 39.5  Platelets 140 - 400 10e3/uL 265 219 257   CMP Latest Ref Rng & Units 05/09/2016 04/25/2016 02/16/2016  Glucose 70 - 140 mg/dl 173(H) 107 105  BUN 7.0 - 26.0 mg/dL 13.2 12.1 11.5  Creatinine 0.7 - 1.3 mg/dL 0.8 0.8 0.8  Sodium 136 - 145 mEq/L 138 140 140  Potassium 3.5 - 5.1 mEq/L 4.3 4.0 4.0  Chloride 101 - 111 mmol/L - - -  CO2 22 - 29 mEq/L 24 26 24   Calcium 8.4 - 10.4 mg/dL 9.0 9.6 9.7  Total Protein 6.4 - 8.3 g/dL 6.6 7.5 8.0  Total Bilirubin 0.20 - 1.20 mg/dL 0.26 0.43 0.41  Alkaline Phos 40 - 150 U/L 102 86 97  AST 5 - 34 U/L 14 23 19   ALT 0 - 55 U/L 26 28 23    CEA:  04/20/2016: <0.5 06/08/2015: <0.5 09/07/2015: <0.5 11/15/2015: 0.9 02/16/2016: <1.0   PATHOLOGY REPORT  Diagnosis 11/02/2015 1. Colon, segmental resection for tumor, sigmoid ADENOCARCINOMA OF THE SIGMOID COLON (2.0 CM), GRADE 3 THE TUMOR INVADES MUSCULARIS PROPRIA MARGINS OF RESECTION ARE NEGATIVE METASTATIC ADENOCARCINOMA IN THREE OF THIRTEEN LYMPH NODES (3/13) 2. Colon, resection margin (donut), distal sigmoid BENIGN COLONIC TISSUE Microscopic Comment 1. COLON AND RECTUM (INCLUDING TRANS-ANAL RESECTION): Specimen: Sigmoid Procedure: Segmental resection Tumor site: sigmoid Specimen integrity: Intact Macroscopic intactness of mesorectum: Not applicable: x Complete: NA Near complete: NA Incomplete: NA Cannot be determined (specify): NA Macroscopic tumor perforation: Muscularis Invasive tumor: Maximum size: 2.0 cm Histologic type(s): Adenocarcinoma Histologic grade and differentiation: G3 G1: well differentiated/low grade G2: moderately differentiated/low  grade G3: poorly differentiated/high grade G4: undifferentiated/high grade Type of polyp in which invasive carcinoma arose: Tubular adenoma Microscopic extension of invasive tumor: Muscularis propria Lymph-Vascular invasion: Negative Peri-neural invasion: Negative Tumor deposit(s) (discontinuous extramural extension): Negative Resection margins: Proximal margin: Negative Distal margin: Negative Circumferential (radial) (posterior ascending, posterior descending; lateral and posterior mid-rectum; and entire lower 1/3 rectum):Negative Mesenteric margin (sigmoid and transverse): Negative Distance closest margin (if all above margins negative): 3.5 cm Trans-anal resection margins only: Deep margin: NA Mucosal Margin: NA Distance closest mucosal margin (if negative): NA Treatment effect (neo-adjuvant therapy): Partial Additional polyp(s): None Non-neoplastic findings: unremarkable Lymph nodes: number examined 13; number positive: 3 Pathologic Staging: T2, N1b, M1a     RADIOGRAPHIC STUDIES: I have personally reviewed the radiological images as listed and agreed with the findings in the report.  PET 04/12/2015 IMPRESSION: Hypermetabolic colonic mass near rectosigmoid junction, consistent with known primary colon carcinoma.  Tiny sub-cm pericolonic lymph nodes in sigmoid mesocolon are too small to characterize by PET, but are suspicious for early lymph node metastases.  Bilateral pulmonary metastases  PET 09/18/2015 IMPRESSION: 1. Hypermetabolic pulmonary metastases have enlarged slightly from 07/18/2015. 2. Hypermetabolic porta hepatis/abdominal peritoneal ligament lymph nodes, stable from 04/12/2015. 3. Increase in hypermetabolism associated with mesenteric haziness and nodularity. While a reactive phenomenon/panniculitis can create this in appearance, metastatic disease/lymphoproliferative disorder cannot be excluded. 4. Hepatic steatosis. 5. Bilateral  nephrolithiasis.  CT chest, abdomen and pelvis with contrast 02/09/2016 IMPRESSION: 1. Pulmonary nodules that appear to be within the radiation fields appear slightly smaller on today's study. The patient does have nodules liver progressed in the interval including a 6 mm posterior left upper lobe nodule and an 11 mm nodule in the posterior medial aspect of the right lower lobe. 2. No evidence for metastatic disease in the abdomen or pelvis. 3. Nonobstructing right renal stone. 4. Edema and stranding around the colonic anastomosis is decreased in the interval.  Ct chest wo contrast 04/05/2016 IMPRESSION: Progressive radiation changes in posterior right upper lobe and superior right lower lobe.  12 mm nodular opacity in the superior segment right lower lobe, mildly increased, indeterminate.  Scattered left  lung nodules measuring up to 8 mm, suspicious for metastases, mildly progressed.   ASSESSMENT & PLAN:  55 year old male, without significant past medical history except kidney stone, presented with intermittent bloody stool for 2 years, and colonoscopy showed a large sigmoid colon mass, CT scan showed multiple (at least 4) nodules in bilateral lungs, measuring about 1 cm.  1. Sigmoid colon adenocarcinoma, [id number], probably stage IV with lung mets  -I previously reviewed his colonoscopy, CT scan findings and the biopsy results in great details with patient and his wife. -I reviewed his surgical pathology findings, which showed a residual T2 primary tumor, 3 lymph nodes positive, grade 3 disease, certainly high risk disease.  -We discussed that his disease is likely incurable, and the goal of therapy is maximum disease control and prolong his life.  -- His tumor does not contain KRAS/NRAS or BRAF mutation, so he would benefit from EGFR antibody, Panitumumab was added on from cycle 4. He received a total of 4 month of chemotherapy which was held per patient's request.He declined more  chemo after his colon surgery.  --I personally reviewed his CT scan image from  04/05/2016 and compared with prior CT with pt in person, the previous radiated area in the superior segment right lower lobe has developed infiltrative change, I suspect this could be cancer progression, although radiation pneumonitis is still also possibility, but it would be unusual giving the timeframe after radiation 4- 5 months ago. He also has a few other small lung nodules has slightly increased in size. -Clinically he has persistent, we'll slightly worse dry cough. He completed a course of prednisone, his cough is not typical from radiation pneumonitis. -I discussed with Dr. Lisbeth Renshaw about his recent CT scan findings, he feels his CT scan finding is not very typical for radiation pneumonitis. He will hold on further SBRT for now. -He has started chemotherapy, tolerated first cycle with irrinotecan alone well -Lab review, adequately to treatment, he is agreeable to start FOLFIRI today -Avastin has been held due to the port placement, we will start in 1-2 weeks    2. Dry cough -His initial cough is likely related to his SBRT,he received a course of prednisone  -I'm concerned he is dry cough to be related to his cancer progression in the lung  -Slightly improved since he started chemotherapy  3. Obesity  -I encouraged him to eat healthy and exercise   Plan -Lab review, we'll proceed with FOLFIRI today, no 5-fu bolus, no neulasta, and continue every 2 weeks -he will return on 11/13 for first dose of Avastin, and continue every 2 weeks -I'll see him back in 2 weeks  I spent 20 minutes counseling the patient face to face. The total time spent in the appointment was 25 minutes and more than 50% was on counseling.     Truitt Merle, MD 05/09/2016

## 2016-05-09 NOTE — Patient Instructions (Addendum)
[facility] Discharge Instructions for Patients Receiving Chemotherapy  Today you received the following chemotherapy agents: Irinotecan, leucovorin and adrucil   To help prevent nausea and vomiting after your treatment, we encourage you to take your nausea medication as directed.    If you develop nausea and vomiting that is not controlled by your nausea medication, call the clinic.   BELOW ARE SYMPTOMS THAT SHOULD BE REPORTED IMMEDIATELY:  *FEVER GREATER THAN 100.5 F  *CHILLS WITH OR WITHOUT FEVER  NAUSEA AND VOMITING THAT IS NOT CONTROLLED WITH YOUR NAUSEA MEDICATION  *UNUSUAL SHORTNESS OF BREATH  *UNUSUAL BRUISING OR BLEEDING  TENDERNESS IN MOUTH AND THROAT WITH OR WITHOUT PRESENCE OF ULCERS  *URINARY PROBLEMS  *BOWEL PROBLEMS  UNUSUAL RASH Items with * indicate a potential emergency and should be followed up as soon as possible.  Feel free to call the clinic you have any questions or concerns. The clinic phone number is [phone].  Please show the [facility] at check-in to the Emergency Department and triage nurse.  Leucovorin injection What is this medicine? LEUCOVORIN (loo koe VOR in) is used to prevent or treat the harmful effects of some medicines. This medicine is used to treat anemia caused by a low amount of folic acid in the body. It is also used with 5-fluorouracil (5-FU) to treat colon cancer. This medicine may be used for other purposes; ask your health care provider or pharmacist if you have questions. What should I tell my health care provider before I take this medicine? They need to know if you have any of these conditions: -anemia from low levels of vitamin B-12 in the blood -an unusual or allergic reaction to leucovorin, folic acid, other medicines, foods, dyes, or preservatives -pregnant or trying to get pregnant -breast-feeding How should I use this medicine? This medicine is for injection into a muscle or into a vein. It  is given by a health care professional in a hospital or clinic setting. Talk to your pediatrician regarding the use of this medicine in children. Special care may be needed. Overdosage: If you think you have taken too much of this medicine contact a poison control center or emergency room at once. NOTE: This medicine is only for you. Do not share this medicine with others. What if I miss a dose? This does not apply. What may interact with this medicine? -capecitabine -fluorouracil -phenobarbital -phenytoin -primidone -trimethoprim-sulfamethoxazole This list may not describe all possible interactions. Give your health care provider a list of all the medicines, herbs, non-prescription drugs, or dietary supplements you use. Also tell them if you smoke, drink alcohol, or use illegal drugs. Some items may interact with your medicine. What should I watch for while using this medicine? Your condition will be monitored carefully while you are receiving this medicine. This medicine may increase the side effects of 5-fluorouracil, 5-FU. Tell your doctor or health care professional if you have diarrhea or mouth sores that do not get better or that get worse. What side effects may I notice from receiving this medicine? Side effects that you should report to your doctor or health care professional as soon as possible: -allergic reactions like skin rash, itching or hives, swelling of the face, lips, or tongue -breathing problems -fever, infection -mouth sores -unusual bleeding or bruising -unusually weak or tired Side effects that usually do not require medical attention (report to your doctor or health care professional if they continue or are bothersome): -constipation or diarrhea -loss of  appetite -nausea, vomiting This list may not describe all possible side effects. Call your doctor for medical advice about side effects. You may report side effects to FDA at 1-800-FDA-1088. Where should I keep  my medicine? This drug is given in a hospital or clinic and will not be stored at home. NOTE: This sheet is a summary. It may not cover all possible information. If you have questions about this medicine, talk to your doctor, pharmacist, or health care provider.    2016, Elsevier/Gold Standard. (2007-12-29 16:50:29)   Fluorouracil, 5-FU injection What is this medicine? FLUOROURACIL, 5-FU (flure oh YOOR a sil) is a chemotherapy drug. It slows the growth of cancer cells. This medicine is used to treat many types of cancer like breast cancer, colon or rectal cancer, pancreatic cancer, and stomach cancer. This medicine may be used for other purposes; ask your health care provider or pharmacist if you have questions. What should I tell my health care provider before I take this medicine? They need to know if you have any of these conditions: -blood disorders -dihydropyrimidine dehydrogenase (DPD) deficiency -infection (especially a virus infection such as chickenpox, cold sores, or herpes) -kidney disease -liver disease -malnourished, poor nutrition -recent or ongoing radiation therapy -an unusual or allergic reaction to fluorouracil, other chemotherapy, other medicines, foods, dyes, or preservatives -pregnant or trying to get pregnant -breast-feeding How should I use this medicine? This drug is given as an infusion or injection into a vein. It is administered in a hospital or clinic by a specially trained health care professional. Talk to your pediatrician regarding the use of this medicine in children. Special care may be needed. Overdosage: If you think you have taken too much of this medicine contact a poison control center or emergency room at once. NOTE: This medicine is only for you. Do not share this medicine with others. What if I miss a dose? It is important not to miss your dose. Call your doctor or health care professional if you are unable to keep an appointment. What may  interact with this medicine? -allopurinol -cimetidine -dapsone -digoxin -hydroxyurea -leucovorin -levamisole -medicines for seizures like ethotoin, fosphenytoin, phenytoin -medicines to increase blood counts like filgrastim, pegfilgrastim, sargramostim -medicines that treat or prevent blood clots like warfarin, enoxaparin, and dalteparin -methotrexate -metronidazole -pyrimethamine -some other chemotherapy drugs like busulfan, cisplatin, estramustine, vinblastine -trimethoprim -trimetrexate -vaccines Talk to your doctor or health care professional before taking any of these medicines: -acetaminophen -aspirin -ibuprofen -ketoprofen -naproxen This list may not describe all possible interactions. Give your health care provider a list of all the medicines, herbs, non-prescription drugs, or dietary supplements you use. Also tell them if you smoke, drink alcohol, or use illegal drugs. Some items may interact with your medicine. What should I watch for while using this medicine? Visit your doctor for checks on your progress. This drug may make you feel generally unwell. This is not uncommon, as chemotherapy can affect healthy cells as well as cancer cells. Report any side effects. Continue your course of treatment even though you feel ill unless your doctor tells you to stop. In some cases, you may be given additional medicines to help with side effects. Follow all directions for their use. Call your doctor or health care professional for advice if you get a fever, chills or sore throat, or other symptoms of a cold or flu. Do not treat yourself. This drug decreases your body's ability to fight infections. Try to avoid being around people who are sick.  This medicine may increase your risk to bruise or bleed. Call your doctor or health care professional if you notice any unusual bleeding. Be careful brushing and flossing your teeth or using a toothpick because you may get an infection or bleed  more easily. If you have any dental work done, tell your dentist you are receiving this medicine. Avoid taking products that contain aspirin, acetaminophen, ibuprofen, naproxen, or ketoprofen unless instructed by your doctor. These medicines may hide a fever. Do not become pregnant while taking this medicine. Women should inform their doctor if they wish to become pregnant or think they might be pregnant. There is a potential for serious side effects to an unborn child. Talk to your health care professional or pharmacist for more information. Do not breast-feed an infant while taking this medicine. Men should inform their doctor if they wish to father a child. This medicine may lower sperm counts. Do not treat diarrhea with over the counter products. Contact your doctor if you have diarrhea that lasts more than 2 days or if it is severe and watery. This medicine can make you more sensitive to the sun. Keep out of the sun. If you cannot avoid being in the sun, wear protective clothing and use sunscreen. Do not use sun lamps or tanning beds/booths. What side effects may I notice from receiving this medicine? Side effects that you should report to your doctor or health care professional as soon as possible: -allergic reactions like skin rash, itching or hives, swelling of the face, lips, or tongue -low blood counts - this medicine may decrease the number of white blood cells, red blood cells and platelets. You may be at increased risk for infections and bleeding. -signs of infection - fever or chills, cough, sore throat, pain or difficulty passing urine -signs of decreased platelets or bleeding - bruising, pinpoint red spots on the skin, black, tarry stools, blood in the urine -signs of decreased red blood cells - unusually weak or tired, fainting spells, lightheadedness -breathing problems -changes in vision -chest pain -mouth sores -nausea and vomiting -pain, swelling, redness at site where  injected -pain, tingling, numbness in the hands or feet -redness, swelling, or sores on hands or feet -stomach pain -unusual bleeding Side effects that usually do not require medical attention (report to your doctor or health care professional if they continue or are bothersome): -changes in finger or toe nails -diarrhea -dry or itchy skin -hair loss -headache -loss of appetite -sensitivity of eyes to the light -stomach upset -unusually teary eyes This list may not describe all possible side effects. Call your doctor for medical advice about side effects. You may report side effects to FDA at 1-800-FDA-1088. Where should I keep my medicine? This drug is given in a hospital or clinic and will not be stored at home. NOTE: This sheet is a summary. It may not cover all possible information. If you have questions about this medicine, talk to your doctor, pharmacist, or health care provider.    2016, Elsevier/Gold Standard. (2007-10-28 13:53:16)  Irinotecan injection What is this medicine? IRINOTECAN (ir in oh TEE kan ) is a chemotherapy drug. It is used to treat colon and rectal cancer. This medicine may be used for other purposes; ask your health care provider or pharmacist if you have questions. What should I tell my health care provider before I take this medicine? They need to know if you have any of these conditions: -blood disorders -dehydration -diarrhea -infection (especially a virus infection  such as chickenpox, cold sores, or herpes) -liver disease -low blood counts, like low white cell, platelet, or red cell counts -recent or ongoing radiation therapy -an unusual or allergic reaction to irinotecan, sorbitol, other chemotherapy, other medicines, foods, dyes, or preservatives -pregnant or trying to get pregnant -breast-feeding How should I use this medicine? This drug is given as an infusion into a vein. It is administered in a hospital or clinic by a specially trained  health care professional. Talk to your pediatrician regarding the use of this medicine in children. Special care may be needed. Overdosage: If you think you have taken too much of this medicine contact a poison control center or emergency room at once. NOTE: This medicine is only for you. Do not share this medicine with others. What if I miss a dose? It is important not to miss your dose. Call your doctor or health care professional if you are unable to keep an appointment. What may interact with this medicine? Do not take this medicine with any of the following medications: -atazanavir -certain medicines for fungal infections like itraconazole and ketoconazole -St. John's Wort This medicine may also interact with the following medications: -dexamethasone -diuretics -laxatives -medicines for seizures like carbamazepine, mephobarbital, phenobarbital, phenytoin, primidone -medicines to increase blood counts like filgrastim, pegfilgrastim, sargramostim -prochlorperazine -vaccines This list may not describe all possible interactions. Give your health care provider a list of all the medicines, herbs, non-prescription drugs, or dietary supplements you use. Also tell them if you smoke, drink alcohol, or use illegal drugs. Some items may interact with your medicine. What should I watch for while using this medicine? Your condition will be monitored carefully while you are receiving this medicine. You will need important blood work done while you are taking this medicine. This drug may make you feel generally unwell. This is not uncommon, as chemotherapy can affect healthy cells as well as cancer cells. Report any side effects. Continue your course of treatment even though you feel ill unless your doctor tells you to stop. In some cases, you may be given additional medicines to help with side effects. Follow all directions for their use. You may get drowsy or dizzy. Do not drive, use machinery, or do  anything that needs mental alertness until you know how this medicine affects you. Do not stand or sit up quickly, especially if you are an older patient. This reduces the risk of dizzy or fainting spells. Call your doctor or health care professional for advice if you get a fever, chills or sore throat, or other symptoms of a cold or flu. Do not treat yourself. This drug decreases your body's ability to fight infections. Try to avoid being around people who are sick. This medicine may increase your risk to bruise or bleed. Call your doctor or health care professional if you notice any unusual bleeding. Be careful brushing and flossing your teeth or using a toothpick because you may get an infection or bleed more easily. If you have any dental work done, tell your dentist you are receiving this medicine. Avoid taking products that contain aspirin, acetaminophen, ibuprofen, naproxen, or ketoprofen unless instructed by your doctor. These medicines may hide a fever. Do not become pregnant while taking this medicine. Women should inform their doctor if they wish to become pregnant or think they might be pregnant. There is a potential for serious side effects to an unborn child. Talk to your health care professional or pharmacist for more information. Do not breast-feed an  infant while taking this medicine. What side effects may I notice from receiving this medicine? Side effects that you should report to your doctor or health care professional as soon as possible: -allergic reactions like skin rash, itching or hives, swelling of the face, lips, or tongue -low blood counts - this medicine may decrease the number of white blood cells, red blood cells and platelets. You may be at increased risk for infections and bleeding. -signs of infection - fever or chills, cough, sore throat, pain or difficulty passing urine -signs of decreased platelets or bleeding - bruising, pinpoint red spots on the skin, black, tarry  stools, blood in the urine -signs of decreased red blood cells - unusually weak or tired, fainting spells, lightheadedness -breathing problems -chest pain -diarrhea -feeling faint or lightheaded, falls -flushing, runny nose, sweating during infusion -mouth sores or pain -pain, swelling, redness or irritation where injected -pain, swelling, warmth in the leg -pain, tingling, numbness in the hands or feet -problems with balance, talking, walking -stomach cramps, pain -trouble passing urine or change in the amount of urine -vomiting as to be unable to hold down drinks or food -yellowing of the eyes or skin Side effects that usually do not require medical attention (report to your doctor or health care professional if they continue or are bothersome): -constipation -hair loss -headache -loss of appetite -nausea, vomiting -stomach upset This list may not describe all possible side effects. Call your doctor for medical advice about side effects. You may report side effects to FDA at 1-800-FDA-1088. Where should I keep my medicine? This drug is given in a hospital or clinic and will not be stored at home. NOTE: This sheet is a summary. It may not cover all possible information. If you have questions about this medicine, talk to your doctor, pharmacist, or health care provider.    2016, Elsevier/Gold Standard. (2012-12-21 16:29:32)

## 2016-05-09 NOTE — Telephone Encounter (Signed)
Per LOS I have schedueld avastin treatment

## 2016-05-09 NOTE — Progress Notes (Signed)
Chemo spill kit reviewed with and given to patient to take home.  Patient verbalizes an understanding of chemo spill kit.  Teach back done.  Patient instructed on 5FU pump and has no questions at this time.  Patient verbalizes an understanding to return to [facility] on Saturday at 12:30PM for pump d/c.

## 2016-05-09 NOTE — Telephone Encounter (Signed)
Per LOS I have scheduled appts and notified the scheduler 

## 2016-05-09 NOTE — Telephone Encounter (Signed)
Appointments scheduled per 11/2 LOS. Patient given AVS report and calendars with future scheduled appointments °

## 2016-05-11 ENCOUNTER — Ambulatory Visit (HOSPITAL_BASED_OUTPATIENT_CLINIC_OR_DEPARTMENT_OTHER): Payer: BLUE CROSS/BLUE SHIELD

## 2016-05-11 VITALS — BP 133/93 | HR 87 | Temp 98.0°F | Resp 16 | Ht 66.0 in

## 2016-05-11 DIAGNOSIS — C7800 Secondary malignant neoplasm of unspecified lung: Principal | ICD-10-CM

## 2016-05-11 DIAGNOSIS — C187 Malignant neoplasm of sigmoid colon: Secondary | ICD-10-CM | POA: Diagnosis not present

## 2016-05-11 DIAGNOSIS — C189 Malignant neoplasm of colon, unspecified: Secondary | ICD-10-CM

## 2016-05-11 MED ORDER — SODIUM CHLORIDE 0.9% FLUSH
10.0000 mL | INTRAVENOUS | Status: DC | PRN
Start: 2016-05-11 — End: 2016-05-11
  Administered 2016-05-11: 10 mL
  Filled 2016-05-11: qty 10

## 2016-05-11 MED ORDER — HEPARIN SOD (PORK) LOCK FLUSH 100 UNIT/ML IV SOLN
500.0000 [IU] | Freq: Once | INTRAVENOUS | Status: AC | PRN
  Administered 2016-05-11: 500 [IU]
  Filled 2016-05-11: qty 5

## 2016-05-13 ENCOUNTER — Telehealth: Payer: Self-pay | Admitting: Medical Oncology

## 2016-05-13 NOTE — Telephone Encounter (Signed)
a 

## 2016-05-13 NOTE — Telephone Encounter (Signed)
Chemo f/u call- pump d/c sat. Pt stated this chemo has " not been a pleasant experience,I feel like a zombie today and I feel lethargic.   I have been up hs sat and sun frequently urinating  I am not sleeping and I feel out of balance. " He states he is at work today and the sx seem to be getting better everyday. He denies UTI symptoms. I told him it may be the decadron that kept him awake and now it is out of his system and it may make him feel lethargic. I told him I will cc info. to [facility] and to call back if symptoms get worse instead of better.

## 2016-05-13 NOTE — Telephone Encounter (Signed)
Called pt and left message on voice mail re:  If pt continues to feel bad, and/or symptoms worsened, pt needs to call office back early tomorrow am, pt might need to see symptom management provider - as per Dr. Ernestina Penna instructions.

## 2016-05-13 NOTE — Telephone Encounter (Signed)
Thanks. If his symptoms are getting worse, then he should come in to see [facility].   Truitt Merle MD

## 2016-05-20 ENCOUNTER — Ambulatory Visit (HOSPITAL_BASED_OUTPATIENT_CLINIC_OR_DEPARTMENT_OTHER): Payer: BLUE CROSS/BLUE SHIELD

## 2016-05-20 VITALS — BP 137/92 | HR 94 | Temp 97.8°F | Resp 16

## 2016-05-20 DIAGNOSIS — Z5112 Encounter for antineoplastic immunotherapy: Secondary | ICD-10-CM

## 2016-05-20 DIAGNOSIS — C7800 Secondary malignant neoplasm of unspecified lung: Principal | ICD-10-CM

## 2016-05-20 DIAGNOSIS — C187 Malignant neoplasm of sigmoid colon: Secondary | ICD-10-CM

## 2016-05-20 DIAGNOSIS — C189 Malignant neoplasm of colon, unspecified: Secondary | ICD-10-CM

## 2016-05-20 LAB — UA PROTEIN, DIPSTICK - CHCC: Protein, ur: NEGATIVE mg/dL

## 2016-05-20 MED ORDER — HEPARIN SOD (PORK) LOCK FLUSH 100 UNIT/ML IV SOLN
500.0000 [IU] | Freq: Once | INTRAVENOUS | Status: AC | PRN
  Administered 2016-05-20: 500 [IU]
  Filled 2016-05-20: qty 5

## 2016-05-20 MED ORDER — SODIUM CHLORIDE 0.9 % IV SOLN
5.2000 mg/kg | Freq: Once | INTRAVENOUS | Status: AC
  Administered 2016-05-20: 500 mg via INTRAVENOUS
  Filled 2016-05-20: qty 16

## 2016-05-20 MED ORDER — SODIUM CHLORIDE 0.9 % IV SOLN
Freq: Once | INTRAVENOUS | Status: AC
  Administered 2016-05-20: 15:00:00 via INTRAVENOUS

## 2016-05-20 MED ORDER — SODIUM CHLORIDE 0.9% FLUSH
10.0000 mL | INTRAVENOUS | Status: DC | PRN
  Administered 2016-05-20: 10 mL
  Filled 2016-05-20: qty 10

## 2016-05-20 NOTE — Patient Instructions (Addendum)
[facility] Discharge Instructions for Patients Receiving Chemotherapy  Today you received the following chemotherapy agents: Avastin.  To help prevent nausea and vomiting after your treatment, we encourage you to take your nausea medication as directed.   If you develop nausea and vomiting that is not controlled by your nausea medication, call the clinic.   BELOW ARE SYMPTOMS THAT SHOULD BE REPORTED IMMEDIATELY:  *FEVER GREATER THAN 100.5 F  *CHILLS WITH OR WITHOUT FEVER  NAUSEA AND VOMITING THAT IS NOT CONTROLLED WITH YOUR NAUSEA MEDICATION  *UNUSUAL SHORTNESS OF BREATH  *UNUSUAL BRUISING OR BLEEDING  TENDERNESS IN MOUTH AND THROAT WITH OR WITHOUT PRESENCE OF ULCERS  *URINARY PROBLEMS  *BOWEL PROBLEMS  UNUSUAL RASH Items with * indicate a potential emergency and should be followed up as soon as possible.  Feel free to call the clinic you have any questions or concerns. The clinic phone number is [phone].  Please show the [facility] at check-in to the Emergency Department and triage nurse.   Bevacizumab injection What is this medicine? BEVACIZUMAB (be va SIZ yoo mab) is a monoclonal antibody. It is used to treat cervical cancer, colorectal cancer, glioblastoma multiforme, non-small cell lung cancer (NSCLC), ovarian cancer, and renal cell cancer. This medicine may be used for other purposes; ask your health care provider or pharmacist if you have questions. What should I tell my health care provider before I take this medicine? They need to know if you have any of these conditions: -blood clots -heart disease, including heart failure, heart attack, or chest pain (angina) -high blood pressure -infection (especially a virus infection such as chickenpox, cold sores, or herpes) -kidney disease -lung disease -prior chemotherapy with doxorubicin, daunorubicin, epirubicin, or other anthracycline type chemotherapy agents -recent or ongoing radiation  therapy -recent surgery -stroke -an unusual or allergic reaction to bevacizumab, hamster proteins, mouse proteins, other medicines, foods, dyes, or preservatives -pregnant or trying to get pregnant -breast-feeding How should I use this medicine? This medicine is for infusion into a vein. It is given by a health care professional in a hospital or clinic setting. Talk to your pediatrician regarding the use of this medicine in children. Special care may be needed. Overdosage: If you think you have taken too much of this medicine contact a poison control center or emergency room at once. NOTE: This medicine is only for you. Do not share this medicine with others. What if I miss a dose? It is important not to miss your dose. Call your doctor or health care professional if you are unable to keep an appointment. What may interact with this medicine? Interactions are not expected. This list may not describe all possible interactions. Give your health care provider a list of all the medicines, herbs, non-prescription drugs, or dietary supplements you use. Also tell them if you smoke, drink alcohol, or use illegal drugs. Some items may interact with your medicine. What should I watch for while using this medicine? Your condition will be monitored carefully while you are receiving this medicine. You will need important blood work and urine testing done while you are taking this medicine. During your treatment, let your health care professional know if you have any unusual symptoms, such as difficulty breathing. This medicine may rarely cause 'gastrointestinal perforation' (holes in the stomach, intestines or colon), a serious side effect requiring surgery to repair. This medicine should be started at least 28 days following major surgery and the site of the surgery should be totally  healed. Check with your doctor before scheduling dental work or surgery while you are receiving this treatment. Talk to your  doctor if you have recently had surgery or if you have a wound that has not healed. Do not become pregnant while taking this medicine or for 6 months after stopping it. Women should inform their doctor if they wish to become pregnant or think they might be pregnant. There is a potential for serious side effects to an unborn child. Talk to your health care professional or pharmacist for more information. Do not breast-feed an infant while taking this medicine. This medicine has caused ovarian failure in some women. This medicine may interfere with the ability to have a child. You should talk to your doctor or health care professional if you are concerned about your fertility. What side effects may I notice from receiving this medicine? Side effects that you should report to your doctor or health care professional as soon as possible: -allergic reactions like skin rash, itching or hives, swelling of the face, lips, or tongue -signs of infection - fever or chills, cough, sore throat, pain or trouble passing urine -signs of decreased platelets or bleeding - bruising, pinpoint red spots on the skin, black, tarry stools, nosebleeds, blood in the urine -breathing problems -changes in vision -chest pain -confusion -jaw pain, especially after dental work -mouth sores -seizures -severe abdominal pain -severe headache -sudden numbness or weakness of the face, arm or leg -swelling of legs or ankles -symptoms of a stroke: change in mental awareness, inability to talk or move one side of the body (especially in patients with lung cancer) -trouble passing urine or change in the amount of urine -trouble speaking or understanding -trouble walking, dizziness, loss of balance or coordination Side effects that usually do not require medical attention (report to your doctor or health care professional if they continue or are bothersome): -constipation -diarrhea -dry skin -headache -loss of appetite -nausea,  vomiting This list may not describe all possible side effects. Call your doctor for medical advice about side effects. You may report side effects to FDA at 1-800-FDA-1088. Where should I keep my medicine? This drug is given in a hospital or clinic and will not be stored at home. NOTE: This sheet is a summary. It may not cover all possible information. If you have questions about this medicine, talk to your doctor, pharmacist, or health care provider.    2016, [employer]. (2014-08-23 16:58:44)

## 2016-05-23 ENCOUNTER — Other Ambulatory Visit: Payer: Self-pay

## 2016-05-23 ENCOUNTER — Ambulatory Visit (HOSPITAL_BASED_OUTPATIENT_CLINIC_OR_DEPARTMENT_OTHER): Payer: BLUE CROSS/BLUE SHIELD | Admitting: Hematology

## 2016-05-23 ENCOUNTER — Ambulatory Visit: Payer: BLUE CROSS/BLUE SHIELD

## 2016-05-23 ENCOUNTER — Other Ambulatory Visit (HOSPITAL_BASED_OUTPATIENT_CLINIC_OR_DEPARTMENT_OTHER): Payer: BLUE CROSS/BLUE SHIELD

## 2016-05-23 ENCOUNTER — Encounter: Payer: Self-pay | Admitting: Hematology

## 2016-05-23 ENCOUNTER — Ambulatory Visit (HOSPITAL_BASED_OUTPATIENT_CLINIC_OR_DEPARTMENT_OTHER): Payer: BLUE CROSS/BLUE SHIELD

## 2016-05-23 VITALS — BP 122/77 | HR 101 | Temp 98.4°F | Resp 18 | Ht 66.0 in | Wt 218.8 lb

## 2016-05-23 DIAGNOSIS — C187 Malignant neoplasm of sigmoid colon: Secondary | ICD-10-CM

## 2016-05-23 DIAGNOSIS — C7801 Secondary malignant neoplasm of right lung: Secondary | ICD-10-CM

## 2016-05-23 DIAGNOSIS — R05 Cough: Secondary | ICD-10-CM

## 2016-05-23 DIAGNOSIS — R918 Other nonspecific abnormal finding of lung field: Secondary | ICD-10-CM

## 2016-05-23 DIAGNOSIS — Z5111 Encounter for antineoplastic chemotherapy: Secondary | ICD-10-CM | POA: Diagnosis not present

## 2016-05-23 DIAGNOSIS — R0789 Other chest pain: Secondary | ICD-10-CM

## 2016-05-23 DIAGNOSIS — C189 Malignant neoplasm of colon, unspecified: Secondary | ICD-10-CM

## 2016-05-23 DIAGNOSIS — C7800 Secondary malignant neoplasm of unspecified lung: Principal | ICD-10-CM

## 2016-05-23 DIAGNOSIS — Z95828 Presence of other vascular implants and grafts: Secondary | ICD-10-CM | POA: Insufficient documentation

## 2016-05-23 DIAGNOSIS — E669 Obesity, unspecified: Secondary | ICD-10-CM

## 2016-05-23 DIAGNOSIS — M549 Dorsalgia, unspecified: Secondary | ICD-10-CM

## 2016-05-23 LAB — CEA (IN HOUSE-CHCC)

## 2016-05-23 LAB — CBC WITH DIFFERENTIAL/PLATELET
BASO%: 0.4 % (ref 0.0–2.0)
BASOS ABS: 0 10*3/uL (ref 0.0–0.1)
EOS%: 2.4 % (ref 0.0–7.0)
Eosinophils Absolute: 0.1 10*3/uL (ref 0.0–0.5)
HEMATOCRIT: 36.6 % — AB (ref 38.4–49.9)
HEMOGLOBIN: 12.3 g/dL — AB (ref 13.0–17.1)
LYMPH#: 1 10*3/uL (ref 0.9–3.3)
LYMPH%: 21.5 % (ref 14.0–49.0)
MCH: 28.9 pg (ref 27.2–33.4)
MCHC: 33.6 g/dL (ref 32.0–36.0)
MCV: 85.9 fL (ref 79.3–98.0)
MONO#: 0.6 10*3/uL (ref 0.1–0.9)
MONO%: 12.9 % (ref 0.0–14.0)
NEUT#: 2.9 10*3/uL (ref 1.5–6.5)
NEUT%: 62.8 % (ref 39.0–75.0)
Platelets: 189 10*3/uL (ref 140–400)
RBC: 4.26 10*6/uL (ref 4.20–5.82)
RDW: 14.2 % (ref 11.0–14.6)
WBC: 4.7 10*3/uL (ref 4.0–10.3)

## 2016-05-23 LAB — COMPREHENSIVE METABOLIC PANEL
ALBUMIN: 3.6 g/dL (ref 3.5–5.0)
ALK PHOS: 94 U/L (ref 40–150)
ALT: 29 U/L (ref 0–55)
ANION GAP: 10 meq/L (ref 3–11)
AST: 21 U/L (ref 5–34)
BILIRUBIN TOTAL: 0.53 mg/dL (ref 0.20–1.20)
BUN: 12.1 mg/dL (ref 7.0–26.0)
CO2: 27 meq/L (ref 22–29)
CREATININE: 0.8 mg/dL (ref 0.7–1.3)
Calcium: 9.5 mg/dL (ref 8.4–10.4)
Chloride: 105 mEq/L (ref 98–109)
EGFR: >90 mL/min/{1.73_m2} (ref >90)
Glucose: 114 mg/dl (ref 70–140)
Potassium: 4.2 mEq/L (ref 3.5–5.1)
Sodium: 142 mEq/L (ref 136–145)
TOTAL PROTEIN: 7.1 g/dL (ref 6.4–8.3)

## 2016-05-23 MED ORDER — ATROPINE SULFATE 1 MG/ML IJ SOLN
INTRAMUSCULAR | Status: AC
  Filled 2016-05-23: qty 1

## 2016-05-23 MED ORDER — SODIUM CHLORIDE 0.9% FLUSH
10.0000 mL | INTRAVENOUS | Status: DC | PRN
  Administered 2016-05-23: 10 mL via INTRAVENOUS
  Filled 2016-05-23: qty 10

## 2016-05-23 MED ORDER — CYCLOBENZAPRINE HCL 5 MG PO TABS: 5.0000 mg | ORAL_TABLET | Freq: Three times a day (TID) | ORAL | 0 refills | Status: DC | PRN

## 2016-05-23 MED ORDER — ATROPINE SULFATE 1 MG/ML IJ SOLN
0.5000 mg | Freq: Once | INTRAMUSCULAR | Status: AC | PRN
  Administered 2016-05-23: 0.5 mg via INTRAVENOUS

## 2016-05-23 MED ORDER — DEXAMETHASONE SODIUM PHOSPHATE 10 MG/ML IJ SOLN
INTRAMUSCULAR | Status: AC
  Filled 2016-05-23: qty 1

## 2016-05-23 MED ORDER — DEXAMETHASONE SODIUM PHOSPHATE 10 MG/ML IJ SOLN
10.0000 mg | Freq: Once | INTRAMUSCULAR | Status: AC
  Administered 2016-05-23: 10 mg via INTRAVENOUS

## 2016-05-23 MED ORDER — SODIUM CHLORIDE 0.9% FLUSH
10.0000 mL | INTRAVENOUS | Status: DC | PRN
  Administered 2016-05-23: 10 mL
  Filled 2016-05-23: qty 10

## 2016-05-23 MED ORDER — HEPARIN SOD (PORK) LOCK FLUSH 100 UNIT/ML IV SOLN
500.0000 [IU] | Freq: Once | INTRAVENOUS | Status: AC | PRN
  Administered 2016-05-23: 500 [IU]
  Filled 2016-05-23: qty 5

## 2016-05-23 MED ORDER — SODIUM CHLORIDE 0.9 % IV SOLN
Freq: Once | INTRAVENOUS | Status: AC
  Administered 2016-05-23: 13:00:00 via INTRAVENOUS

## 2016-05-23 MED ORDER — PALONOSETRON HCL INJECTION 0.25 MG/5ML
0.2500 mg | Freq: Once | INTRAVENOUS | Status: AC
  Administered 2016-05-23: 0.25 mg via INTRAVENOUS

## 2016-05-23 MED ORDER — IRINOTECAN HCL CHEMO INJECTION 100 MG/5ML
180.0000 mg/m2 | Freq: Once | INTRAVENOUS | Status: AC
  Administered 2016-05-23: 380 mg via INTRAVENOUS
  Filled 2016-05-23: qty 4

## 2016-05-23 MED ORDER — PALONOSETRON HCL INJECTION 0.25 MG/5ML
INTRAVENOUS | Status: AC
  Filled 2016-05-23: qty 5

## 2016-05-23 MED ORDER — PROCHLORPERAZINE MALEATE 10 MG PO TABS: 10.0000 mg | ORAL_TABLET | Freq: Four times a day (QID) | ORAL | 2 refills | Status: DC | PRN

## 2016-05-23 NOTE — Progress Notes (Signed)
[place]  Telephone:([phone] Fax:(336) [phone]  Clinic Follow Up Note   Patient Care Team: Dibas Koirala, MD as PCP - General (Family Medicine) Wilford Corner, MD as Consulting Physician (Gastroenterology) Jackolyn Confer, MD as Consulting Physician (General Surgery) Truitt Merle, MD as Consulting Physician (Hematology) 05/23/2016  CHIEF COMPLAINTS:  Follow Up colon cancer  Oncology History   T2, Colon cancer metastasized to lung [facility])   Staging form: Colon and Rectum, AJCC 7th Edition     Clinical stage from 03/30/2015: Stage Unknown ([place], N1, M1) - Unsigned       Colon cancer metastasized to lung [facility]) s/p laparoscopic assisted sigmoid colectomy 11/02/15   03/30/2015 Miscellaneous    Foundation one genomic testing showed TP53 mutation, MSI stable, low tumor mutation burden. Negative for K-ras, NRAS and BRAF      03/30/2015 Initial Biopsy    Sigmoid mass biopsy showed invasive adenocarcinoma. Cecal colon polyps showed tubular adenoma.      03/30/2015 Initial Diagnosis    Colon cancer      03/30/2015 Procedure    colonoscopy by Dr. Michail Sermon showed a fungating, infiltrative and ulcerated nonobstructing large mass in the sigmoid colon and at 20 cm proximal to the anus. The mass was partially circumferential no bleeding. A 10 mm polyps in the cecum was removed.      04/03/2015 Imaging    CT chest, abdomen and pelvis with contrast showed nodular masslike area of clinical worsening at rectosigmoid junction, tiny pericolonic lymph nodes, bilateral pulmonary nodules measuring about 1 cm.      04/12/2015 PET scan    Hypermetabolic colonic mass near rectosigmoid junction, tiny subcentimeter paracolonic lymph nodes. Bilateral pulmonary metastasis.      04/19/2015 Procedure    CT-guided lung nodule biopsy attempted, unsuccessful.      04/27/2015 - 09/14/2015 Chemotherapy    Oxaliplatin 130 mg/m on day 1, Capecitabine 2336m q12hr, 2 weeks on and one week off  (only received 7 days for first cycle), oxaliplatin held on cycle 5 and dose reduced to 1071mm2, capecitabine reduced to 200033m12h D1-14, stopped per pt's request.       06/29/2015 - 10/19/2015 Chemotherapy     Panitumumab every 2 weeks, some cycles were postponed due to pt's request, stoppe per pt's request       09/18/2015 Imaging    Pulmonary nodules are less hypermetabolic, stable size. Stable hypermetabolic portal hepatis and abdominal peritoneal ligament lymph nodes. No other new lesions.       11/02/2015 Surgery    sigmoid colon segmental resection       11/02/2015 Pathology Results    Sigmoid colon segmental resection showed adenocarcinoma, grade 3,  T2, 3 out of 13 lymph nodes were positive, surgical margins were negative. LVI(-), perineural invasion negative      11/13/2015 Imaging    CT chest, abdomen and pelvis with contrast showed postsurgical changes, mild progression of pulmonary metastasis, measuring up to 15 mm in the right lower lobe.      11/29/2015 - 12/05/2015 Radiation Therapy    SBRT to 4 lung lesions in 3 sessions       04/25/2016 -  Chemotherapy    FOLFIRI every 2 weeks, and Avastin added from cycle 3         HISTORY OF PRESENTING ILLNESS:  Bill Wright 55o. male is here because of recently newly diagnosed colon cancer.  He has had intermittent bloody stool for 2 years, it has been mild, mixed with stool,  patient does not have any abdominal pain, constipation, change of his bowel habits, nausea, weight loss or other symptoms. He did not seek medical attention for this. He went to emergency room on 03/15/2015 for right flank pain, due to his kidney stone. CT scan incidentally found a 11 mm right lower lobe nodule and mucosal edema in the sigmoid colon. He saw his primary care physician, and was referred to GI Dr. Michail Sermon here at he underwent colonoscopy on 03/30/2015, which showed a fungating infiltrative and ulcerated nonobstructing large mass in the  sigmoid colon, biopsy showed adenocarcinoma. CT chest abdomen and pelvis showed multiple lung nodules measuring about 1 cm. He was referred to surgeon Dr. Zella Richer, who referred patient to [place] for further workup of his lung nodule and discuss chemotherapy.  He feels very well overall, denies any symptoms. He is a Freight forwarder at [employer], lives with his wife and 3 children. He never had screening colonoscopy prior the reason one, no significant past medical history, does not see doctors regularly.  CURRENT TREATMENT: chemotherapy Irinotecan and Avastin, started on 04/25/2016, he tried FOLFIRI for cycle 2 but tolerated poorly   INTERIM HISTORY Jarin returns for follow-up and chemo He to do poorly with FOLFIRI, . He was quite fatigued, cannot sleep well at night, urinary frequency at night,  feels lethargic although he has been able to go to work, much improved now  His cough has improved  First dose Avastin early this week, developed chest tenderness after infusion, nose dyspnea, or chest pain. His wife and his tenderness is around his shoulder blade, bilateral, and some stiffness of his right shoulder, he has not been to gym or exercise lately.   MEDICAL HISTORY:  Past Medical History:  Diagnosis Date  . Anxiety    situational due to cancer diagnosis  . Cancer [facility]) 2017   colon-chemo 09/22/15 now surgery  . Hypercholesteremia   . Kidney calculi     SURGICAL HISTORY: Past Surgical History:  Procedure Laterality Date  . COLONOSCOPY  03/30/15  . LAPAROSCOPIC PARTIAL COLECTOMY N/A 11/02/2015   Procedure: LAPAROSCOPIC ASSISTED SIGMOID COLECTOMY;  Surgeon: Jackolyn Confer, MD;  Location: [facility] ORS;  Service: General;  Laterality: N/A;  . PORTACATH PLACEMENT N/A 05/08/2016   Procedure: INSERTION PORT-A-CATH;  Surgeon: Jackolyn Confer, MD;  Location: [facility];  Service: General;  Laterality: N/A;    SOCIAL HISTORY: Social History   Social History  . Marital Status: Married    Spouse Name:  N/A  . Number of Children: 3, age of 7, 37 and 51   . Years of Education: N/A   Occupational History  . Banker for [employer] [place]    Social History Main Topics  . Smoking status: Never Smoker   . Smokeless tobacco: Not on file  . Alcohol Use: No  . Drug Use: No  . Sexual Activity: Not on file   Other Topics Concern  . Not on file   Social History Narrative    FAMILY HISTORY: Family History  Problem Relation Age of Onset  . Breast cancer Mother 59    +rad and lymph node  . Diabetes Maternal Grandmother     leading to blindness  . Obesity Maternal Aunt   . Stroke Maternal Uncle 65    ALLERGIES:  has No Known Allergies.  MEDICATIONS:  Current Outpatient Prescriptions  Medication Sig Dispense Refill  . ALPRAZolam (XANAX) 0.25 MG tablet Take 1 tablet (0.25 mg total) by mouth 3 (three) times daily as  needed for anxiety. 30 tablet 0  . clindamycin (CLINDAGEL) 1 % gel Apply topically 2 (two) times daily. (Patient not taking: Reported on 05/08/2016) 30 g 5  . cyclobenzaprine (FLEXERIL) 5 MG tablet Take 1 tablet (5 mg total) by mouth 3 (three) times daily as needed for muscle spasms. 30 tablet 0  . HYDROcodone-acetaminophen (NORCO/VICODIN) 5-325 MG tablet Take 1-2 tablets by mouth every 4 (four) hours as needed for moderate pain or severe pain. 15 tablet 0  . hydrocortisone 2.5 % lotion APPLY EXTERNALLY TO THE AFFECTED AREA TWICE DAILY (Patient not taking: Reported on 05/08/2016) 200 mL 0  . ibuprofen (ADVIL,MOTRIN) 200 MG tablet Take 600 mg by mouth every 6 (six) hours as needed.    . lidocaine-prilocaine (EMLA) cream Apply 1 application topically as needed. (Patient not taking: Reported on 05/08/2016) 30 g 1  . ondansetron (ZOFRAN) 8 MG tablet Take 1 tablet (8 mg total) by mouth 2 (two) times daily as needed for refractory nausea / vomiting. Start on day 3 after chemotherapy. 30 tablet 1  . prochlorperazine (COMPAZINE) 10 MG tablet Take 1 tablet (10 mg total) by mouth every 6 (six)  hours as needed (NAUSEA). 30 tablet 2   No current facility-administered medications for this visit.    Facility-Administered Medications Ordered in Other Visits  Medication Dose Route Frequency Provider Last Rate Last Dose  . sodium chloride flush (NS) 0.9 % injection 10 mL  10 mL Intracatheter PRN Truitt Merle, MD   10 mL at 05/23/16 1600    REVIEW OF SYSTEMS:   Constitutional: Denies fevers, chills or abnormal night sweats, no weight loss. Eyes: Denies blurriness of vision, double vision or watery eyes Ears, nose, mouth, throat, and face: Denies mucositis or sore throat Respiratory: Denies cough, dyspnea or wheezes Cardiovascular: Denies palpitation, chest discomfort or lower extremity swelling Gastrointestinal:  Denies nausea, heartburn or change in bowel habits Skin: Denies abnormal skin rashes Lymphatics: Denies new lymphadenopathy or easy bruising Neurological:Denies numbness, tingling or new weaknesses Behavioral/Psych: Mood is stable, no new changes  All other systems were reviewed with the patient and are negative.  PHYSICAL EXAMINATION: ECOG PERFORMANCE STATUS: 1 BP 122/77 (BP Location: Right Arm, Patient Position: Sitting)   Pulse (!) 101   Temp 98.4 F (36.9 C) (Oral)   Resp 18   Ht _0  (1.676 m)   Wt 218 lb 12.8 oz (99.2 kg)   SpO2 98%   BMI 35.32 kg/m  GENERAL:alert, no distress and comfortable SKIN: skin color, texture, turgor are normal, diffuse acne-like rash on his front chest and back, scattered rash on his neck and face, there is a healing large boil above his upper lip, no discharge  EYES: normal, conjunctiva are pink and non-injected, sclera clear OROPHARYNX:no exudate, no erythema and lips, buccal mucosa, and tongue normal  NECK: supple, thyroid normal size, non-tender, without nodularity LYMPH:  no palpable lymphadenopathy in the cervical, axillary or inguinal LUNGS: clear to auscultation and percussion with normal breathing effort HEART: regular rate &  rhythm and no murmurs and no lower extremity edema ABDOMEN:abdomen soft, non-tender and normal bowel sounds. The midline incision has healed well, no discharge or skin erythema  Musculoskeletal:no cyanosis of digits and no clubbing  PSYCH: alert & oriented x 3 with fluent speech NEURO: no focal motor/sensory deficits  LABORATORY DATA:  I have reviewed the data as listed CBC Latest Ref Rng & Units 05/23/2016 05/09/2016 04/25/2016  WBC 4.0 - 10.3 10e3/uL 4.7 12.7(H) 6.3  Hemoglobin 13.0 -  17.1 g/dL 12.3(L) 11.4(L) 13.3  Hematocrit 38.4 - 49.9 % 36.6(L) 34.7(L) 40.4  Platelets 140 - 400 10e3/uL 189 265 219   CMP Latest Ref Rng & Units 05/23/2016 05/09/2016 04/25/2016  Glucose 70 - 140 mg/dl 114 173(H) 107  BUN 7.0 - 26.0 mg/dL 12.1 13.2 12.1  Creatinine 0.7 - 1.3 mg/dL 0.8 0.8 0.8  Sodium 136 - 145 mEq/L 142 138 140  Potassium 3.5 - 5.1 mEq/L 4.2 4.3 4.0  Chloride 101 - 111 mmol/L - - -  CO2 22 - 29 mEq/L _0 Calcium 8.4 - 10.4 mg/dL 9.5 9.0 9.6  Total Protein 6.4 - 8.3 g/dL 7.1 6.6 7.5  Total Bilirubin 0.20 - 1.20 mg/dL 0.53 0.26 0.43  Alkaline Phos 40 - 150 U/L 94 102 86  AST 5 - 34 U/L _1 ALT 0 - 55 U/L _2 CEA:  04/20/2016: <0.5 06/08/2015: <0.5 09/07/2015: <0.5 11/15/2015: 0.9 02/16/2016: <1.0  04/25/2016: 1.6  PATHOLOGY REPORT  Diagnosis 11/02/2015 1. Colon, segmental resection for tumor, sigmoid ADENOCARCINOMA OF THE SIGMOID COLON (2.0 CM), GRADE 3 THE TUMOR INVADES MUSCULARIS PROPRIA MARGINS OF RESECTION ARE NEGATIVE METASTATIC ADENOCARCINOMA IN THREE OF THIRTEEN LYMPH NODES (3/13) 2. Colon, resection margin (donut), distal sigmoid BENIGN COLONIC TISSUE Microscopic Comment 1. COLON AND RECTUM (INCLUDING TRANS-ANAL RESECTION): Specimen: Sigmoid Procedure: Segmental resection Tumor site: sigmoid Specimen integrity: Intact Macroscopic intactness of mesorectum: Not applicable: x Complete: NA Near complete: NA Incomplete: NA Cannot be determined  (specify): NA Macroscopic tumor perforation: Muscularis Invasive tumor: Maximum size: 2.0 cm Histologic type(s): Adenocarcinoma Histologic grade and differentiation: G3 G1: well differentiated/low grade G2: moderately differentiated/low grade G3: poorly differentiated/high grade G4: undifferentiated/high grade Type of polyp in which invasive carcinoma arose: Tubular adenoma Microscopic extension of invasive tumor: Muscularis propria Lymph-Vascular invasion: Negative Peri-neural invasion: Negative Tumor deposit(s) (discontinuous extramural extension): Negative Resection margins: Proximal margin: Negative Distal margin: Negative Circumferential (radial) (posterior ascending, posterior descending; lateral and posterior mid-rectum; and entire lower 1/3 rectum):Negative Mesenteric margin (sigmoid and transverse): Negative Distance closest margin (if all above margins negative): 3.5 cm Trans-anal resection margins only: Deep margin: NA Mucosal Margin: NA Distance closest mucosal margin (if negative): NA Treatment effect (neo-adjuvant therapy): Partial Additional polyp(s): None Non-neoplastic findings: unremarkable Lymph nodes: number examined 13; number positive: 3 Pathologic Staging: T2, N1b, M1a     RADIOGRAPHIC STUDIES: I have personally reviewed the radiological images as listed and agreed with the findings in the report.  PET 04/12/2015 IMPRESSION: Hypermetabolic colonic mass near rectosigmoid junction, consistent with known primary colon carcinoma.  Tiny sub-cm pericolonic lymph nodes in sigmoid mesocolon are too small to characterize by PET, but are suspicious for early lymph node metastases.  Bilateral pulmonary metastases  PET 09/18/2015 IMPRESSION: 1. Hypermetabolic pulmonary metastases have enlarged slightly from 07/18/2015. 2. Hypermetabolic porta hepatis/abdominal peritoneal ligament lymph nodes, stable from 04/12/2015. 3. Increase in hypermetabolism  associated with mesenteric haziness and nodularity. While a reactive phenomenon/panniculitis can create this in appearance, metastatic disease/lymphoproliferative disorder cannot be excluded. 4. Hepatic steatosis. 5. Bilateral nephrolithiasis.  CT chest, abdomen and pelvis with contrast 02/09/2016 IMPRESSION: 1. Pulmonary nodules that appear to be within the radiation fields appear slightly smaller on today's study. The patient does have nodules liver progressed in the interval including a 6 mm posterior left upper lobe nodule and an 11 mm nodule in the posterior medial aspect of the right lower lobe. 2. No evidence for metastatic disease in the abdomen or pelvis. 3.  Nonobstructing right renal stone. 4. Edema and stranding around the colonic anastomosis is decreased in the interval.  Ct chest wo contrast 04/05/2016 IMPRESSION: Progressive radiation changes in posterior right upper lobe and superior right lower lobe.  12 mm nodular opacity in the superior segment right lower lobe, mildly increased, indeterminate.  Scattered left lung nodules measuring up to 8 mm, suspicious for metastases, mildly progressed.   ASSESSMENT & PLAN:  55 year old male, without significant past medical history except kidney stone, presented with intermittent bloody stool for 2 years, and colonoscopy showed a large sigmoid colon mass, CT scan showed multiple (at least 4) nodules in bilateral lungs, measuring about 1 cm.  1. Sigmoid colon adenocarcinoma, [id number], probably stage IV with lung mets  -I previously reviewed his colonoscopy, CT scan findings and the biopsy results in great details with patient and his wife. -I reviewed his surgical pathology findings, which showed a residual T2 primary tumor, 3 lymph nodes positive, grade 3 disease, certainly high risk disease.  -We discussed that his disease is likely incurable, and the goal of therapy is maximum disease control and prolong his life.  --  His tumor does not contain KRAS/NRAS or BRAF mutation, so he would benefit from EGFR antibody, Panitumumab was added on from cycle 4. He received a total of 4 month of chemotherapy which was held per patient's request.He declined more chemo after his colon surgery.  --I personally reviewed his CT scan image from  04/05/2016 and compared with prior CT with pt in person, the previous radiated area in the superior segment right lower lobe has developed infiltrative change, I suspect this could be cancer progression, although radiation pneumonitis is still also possibility, but it would be unusual giving the timeframe after radiation 4- 5 months ago. He also has a few other small lung nodules has slightly increased in size. -Clinically he has persistent, slightly worse dry cough. He completed a course of prednisone, his cough is not typical from radiation pneumonitis. -I discussed with Dr. Lisbeth Renshaw about his recent CT scan findings, he feels his CT scan finding is not very typical for radiation pneumonitis. He will hold on further SBRT for now. -He has started chemotherapy, cough improved -He did not tolerate FOLFIRI well, refuses to continue, but agrees to continue irinotecan  -Lab review, adequately to treatment, will proceed Cycle 3 irinotecan today  -continue Avastin every 2 weeks, on a different day -due to his chest mass after a fasting lesion, I'll obtain a EKG in the clinic today, which was unremarkable.  2. Dry cough -His initial cough is likely related to his SBRT,he received a course of prednisone  -I'm concerned he is dry cough to be related to his cancer progression in the lung  - improved since he started chemotherapy  3. Obesity  -I encouraged him to eat healthy and exercise   4. Chest tightness, back pain -he has had some shoulder upper back tightness lately, he has been taking hydrocodone for that -EKG today was unremarkable -This is likely muscular related, I gave him a prescription of  Flexeril today  Plan -pt refuses 5-fu infusion due to his poor tolerance last cycle, we'll stop. We'll proceed C3 irinotecan today, and continue every 2 weeks -continue Avastin every 2 weeks on Mondays  -I'll see him back in 2 weeks, plan to repeat CT scan after 5 cycles of chemo, will order on next visit   I spent 20 minutes counseling the patient face to face. The total time spent  in the appointment was 25 minutes and more than 50% was on counseling.     Truitt Merle, MD 05/23/2016

## 2016-05-23 NOTE — Patient Instructions (Signed)
[facility] Discharge Instructions for Patients Receiving Chemotherapy  Today you received the following chemotherapy agents Camptosar.  To help prevent nausea and vomiting after your treatment, we encourage you to take your nausea medication as prescribed . If you develop nausea and vomiting that is not controlled by your nausea medication, call the clinic.   BELOW ARE SYMPTOMS THAT SHOULD BE REPORTED IMMEDIATELY:  *FEVER GREATER THAN 100.5 F  *CHILLS WITH OR WITHOUT FEVER  NAUSEA AND VOMITING THAT IS NOT CONTROLLED WITH YOUR NAUSEA MEDICATION  *UNUSUAL SHORTNESS OF BREATH  *UNUSUAL BRUISING OR BLEEDING  TENDERNESS IN MOUTH AND THROAT WITH OR WITHOUT PRESENCE OF ULCERS  *URINARY PROBLEMS  *BOWEL PROBLEMS  UNUSUAL RASH Items with * indicate a potential emergency and should be followed up as soon as possible.  Feel free to call the clinic you have any questions or concerns. The clinic phone number is [phone].  Please show the [place] at check-in to the Emergency Department and triage nurse.

## 2016-06-03 ENCOUNTER — Other Ambulatory Visit: Payer: Self-pay | Admitting: Hematology

## 2016-06-03 ENCOUNTER — Ambulatory Visit (HOSPITAL_BASED_OUTPATIENT_CLINIC_OR_DEPARTMENT_OTHER): Payer: BLUE CROSS/BLUE SHIELD

## 2016-06-03 VITALS — BP 131/79

## 2016-06-03 DIAGNOSIS — Z5112 Encounter for antineoplastic immunotherapy: Secondary | ICD-10-CM | POA: Diagnosis not present

## 2016-06-03 DIAGNOSIS — C187 Malignant neoplasm of sigmoid colon: Secondary | ICD-10-CM | POA: Diagnosis not present

## 2016-06-03 DIAGNOSIS — C189 Malignant neoplasm of colon, unspecified: Secondary | ICD-10-CM

## 2016-06-03 DIAGNOSIS — C7800 Secondary malignant neoplasm of unspecified lung: Secondary | ICD-10-CM

## 2016-06-03 MED ORDER — BEVACIZUMAB CHEMO INJECTION 400 MG/16ML
5.1000 mg/kg | Freq: Once | INTRAVENOUS | Status: AC
  Administered 2016-06-03: 500 mg via INTRAVENOUS
  Filled 2016-06-03: qty 16

## 2016-06-03 MED ORDER — SODIUM CHLORIDE 0.9 % IV SOLN
Freq: Once | INTRAVENOUS | Status: AC
  Administered 2016-06-03: 14:00:00 via INTRAVENOUS

## 2016-06-03 MED ORDER — SODIUM CHLORIDE 0.9% FLUSH
10.0000 mL | INTRAVENOUS | Status: DC | PRN
  Administered 2016-06-03: 10 mL
  Filled 2016-06-03: qty 10

## 2016-06-03 MED ORDER — HEPARIN SOD (PORK) LOCK FLUSH 100 UNIT/ML IV SOLN
500.0000 [IU] | Freq: Once | INTRAVENOUS | Status: AC | PRN
  Administered 2016-06-03: 500 [IU]
  Filled 2016-06-03: qty 5

## 2016-06-03 NOTE — Patient Instructions (Signed)
[facility] Discharge Instructions for Patients Receiving Chemotherapy  Today you received the following chemotherapy agents Avastin   To help prevent nausea and vomiting after your treatment, we encourage you to take your nausea medication as directed.    If you develop nausea and vomiting that is not controlled by your nausea medication, call the clinic.   BELOW ARE SYMPTOMS THAT SHOULD BE REPORTED IMMEDIATELY:  *FEVER GREATER THAN 100.5 F  *CHILLS WITH OR WITHOUT FEVER  NAUSEA AND VOMITING THAT IS NOT CONTROLLED WITH YOUR NAUSEA MEDICATION  *UNUSUAL SHORTNESS OF BREATH  *UNUSUAL BRUISING OR BLEEDING  TENDERNESS IN MOUTH AND THROAT WITH OR WITHOUT PRESENCE OF ULCERS  *URINARY PROBLEMS  *BOWEL PROBLEMS  UNUSUAL RASH Items with * indicate a potential emergency and should be followed up as soon as possible.  Feel free to call the clinic you have any questions or concerns. The clinic phone number is [phone].  Please show the CHEMO ALERT CARD at check-in to the Emergency Department and triage nurse.   

## 2016-06-06 ENCOUNTER — Other Ambulatory Visit (HOSPITAL_BASED_OUTPATIENT_CLINIC_OR_DEPARTMENT_OTHER): Payer: BLUE CROSS/BLUE SHIELD

## 2016-06-06 ENCOUNTER — Ambulatory Visit: Payer: BLUE CROSS/BLUE SHIELD

## 2016-06-06 ENCOUNTER — Ambulatory Visit (HOSPITAL_BASED_OUTPATIENT_CLINIC_OR_DEPARTMENT_OTHER): Payer: BLUE CROSS/BLUE SHIELD | Admitting: Hematology

## 2016-06-06 ENCOUNTER — Ambulatory Visit (HOSPITAL_BASED_OUTPATIENT_CLINIC_OR_DEPARTMENT_OTHER): Payer: BLUE CROSS/BLUE SHIELD

## 2016-06-06 ENCOUNTER — Encounter: Payer: Self-pay | Admitting: Hematology

## 2016-06-06 VITALS — BP 130/78 | HR 91 | Temp 98.6°F | Resp 18 | Ht 66.0 in | Wt 217.9 lb

## 2016-06-06 DIAGNOSIS — Z95828 Presence of other vascular implants and grafts: Secondary | ICD-10-CM

## 2016-06-06 DIAGNOSIS — C7800 Secondary malignant neoplasm of unspecified lung: Principal | ICD-10-CM

## 2016-06-06 DIAGNOSIS — C189 Malignant neoplasm of colon, unspecified: Secondary | ICD-10-CM

## 2016-06-06 DIAGNOSIS — C187 Malignant neoplasm of sigmoid colon: Secondary | ICD-10-CM

## 2016-06-06 DIAGNOSIS — R918 Other nonspecific abnormal finding of lung field: Secondary | ICD-10-CM

## 2016-06-06 DIAGNOSIS — Z5111 Encounter for antineoplastic chemotherapy: Secondary | ICD-10-CM | POA: Diagnosis not present

## 2016-06-06 DIAGNOSIS — R05 Cough: Secondary | ICD-10-CM | POA: Diagnosis not present

## 2016-06-06 DIAGNOSIS — R0789 Other chest pain: Secondary | ICD-10-CM

## 2016-06-06 DIAGNOSIS — R1013 Epigastric pain: Secondary | ICD-10-CM

## 2016-06-06 DIAGNOSIS — E669 Obesity, unspecified: Secondary | ICD-10-CM

## 2016-06-06 DIAGNOSIS — C7801 Secondary malignant neoplasm of right lung: Secondary | ICD-10-CM

## 2016-06-06 DIAGNOSIS — M549 Dorsalgia, unspecified: Secondary | ICD-10-CM

## 2016-06-06 LAB — CBC WITH DIFFERENTIAL/PLATELET
BASO%: 0.3 % (ref 0.0–2.0)
Basophils Absolute: 0 10*3/uL (ref 0.0–0.1)
EOS%: 1.4 % (ref 0.0–7.0)
Eosinophils Absolute: 0.1 10*3/uL (ref 0.0–0.5)
HEMATOCRIT: 37.2 % — AB (ref 38.4–49.9)
HEMOGLOBIN: 12.4 g/dL — AB (ref 13.0–17.1)
LYMPH#: 1 10*3/uL (ref 0.9–3.3)
LYMPH%: 16.8 % (ref 14.0–49.0)
MCH: 29 pg (ref 27.2–33.4)
MCHC: 33.3 g/dL (ref 32.0–36.0)
MCV: 86.9 fL (ref 79.3–98.0)
MONO#: 0.6 10*3/uL (ref 0.1–0.9)
MONO%: 11 % (ref 0.0–14.0)
NEUT%: 70.5 % (ref 39.0–75.0)
NEUTROS ABS: 4 10*3/uL (ref 1.5–6.5)
PLATELETS: 201 10*3/uL (ref 140–400)
RBC: 4.28 10*6/uL (ref 4.20–5.82)
RDW: 14.3 % (ref 11.0–14.6)
WBC: 5.7 10*3/uL (ref 4.0–10.3)

## 2016-06-06 LAB — COMPREHENSIVE METABOLIC PANEL
ALT: 33 U/L (ref 0–55)
ANION GAP: 8 meq/L (ref 3–11)
AST: 22 U/L (ref 5–34)
Albumin: 3.4 g/dL — ABNORMAL LOW (ref 3.5–5.0)
Alkaline Phosphatase: 96 U/L (ref 40–150)
BILIRUBIN TOTAL: 0.43 mg/dL (ref 0.20–1.20)
BUN: 10.1 mg/dL (ref 7.0–26.0)
CALCIUM: 9.7 mg/dL (ref 8.4–10.4)
CO2: 27 mEq/L (ref 22–29)
CREATININE: 0.8 mg/dL (ref 0.7–1.3)
Chloride: 106 mEq/L (ref 98–109)
EGFR: >90 mL/min/{1.73_m2} (ref >90)
Glucose: 121 mg/dl (ref 70–140)
Potassium: 4.2 mEq/L (ref 3.5–5.1)
Sodium: 141 mEq/L (ref 136–145)
TOTAL PROTEIN: 6.9 g/dL (ref 6.4–8.3)

## 2016-06-06 MED ORDER — PALONOSETRON HCL INJECTION 0.25 MG/5ML
INTRAVENOUS | Status: AC
Start: 2016-06-06 — End: 2016-06-06
  Filled 2016-06-06: qty 5

## 2016-06-06 MED ORDER — SODIUM CHLORIDE 0.9% FLUSH
10.0000 mL | INTRAVENOUS | Status: DC | PRN
  Administered 2016-06-06: 10 mL via INTRAVENOUS
  Filled 2016-06-06: qty 10

## 2016-06-06 MED ORDER — SODIUM CHLORIDE 0.9 % IV SOLN
Freq: Once | INTRAVENOUS | Status: AC
  Administered 2016-06-06: 10:00:00 via INTRAVENOUS

## 2016-06-06 MED ORDER — IRINOTECAN HCL CHEMO INJECTION 100 MG/5ML
160.0000 mg/m2 | Freq: Once | INTRAVENOUS | Status: AC
  Administered 2016-06-06: 340 mg via INTRAVENOUS
  Filled 2016-06-06: qty 15

## 2016-06-06 MED ORDER — ATROPINE SULFATE 1 MG/ML IJ SOLN
0.5000 mg | Freq: Once | INTRAMUSCULAR | Status: AC | PRN
  Administered 2016-06-06: 0.5 mg via INTRAVENOUS

## 2016-06-06 MED ORDER — PALONOSETRON HCL INJECTION 0.25 MG/5ML
0.2500 mg | Freq: Once | INTRAVENOUS | Status: AC
  Administered 2016-06-06: 0.25 mg via INTRAVENOUS

## 2016-06-06 MED ORDER — SODIUM CHLORIDE 0.9% FLUSH
10.0000 mL | INTRAVENOUS | Status: DC | PRN
  Administered 2016-06-06: 10 mL
  Filled 2016-06-06: qty 10

## 2016-06-06 MED ORDER — DEXAMETHASONE SODIUM PHOSPHATE 10 MG/ML IJ SOLN
INTRAMUSCULAR | Status: AC
  Filled 2016-06-06: qty 1

## 2016-06-06 MED ORDER — ATROPINE SULFATE 1 MG/ML IJ SOLN
INTRAMUSCULAR | Status: AC
  Filled 2016-06-06: qty 1

## 2016-06-06 MED ORDER — DEXAMETHASONE SODIUM PHOSPHATE 10 MG/ML IJ SOLN
8.0000 mg | Freq: Once | INTRAMUSCULAR | Status: AC
  Administered 2016-06-06: 8 mg via INTRAVENOUS

## 2016-06-06 MED ORDER — HEPARIN SOD (PORK) LOCK FLUSH 100 UNIT/ML IV SOLN
500.0000 [IU] | Freq: Once | INTRAVENOUS | Status: AC | PRN
  Administered 2016-06-06: 500 [IU]
  Filled 2016-06-06: qty 5

## 2016-06-06 NOTE — Patient Instructions (Signed)
[facility] Discharge Instructions for Patients Receiving Chemotherapy  Today you received the following chemotherapy agents Camptosar.  To help prevent nausea and vomiting after your treatment, we encourage you to take your nausea medication.   If you develop nausea and vomiting that is not controlled by your nausea medication, call the clinic.   BELOW ARE SYMPTOMS THAT SHOULD BE REPORTED IMMEDIATELY:  *FEVER GREATER THAN 100.5 F  *CHILLS WITH OR WITHOUT FEVER  NAUSEA AND VOMITING THAT IS NOT CONTROLLED WITH YOUR NAUSEA MEDICATION  *UNUSUAL SHORTNESS OF BREATH  *UNUSUAL BRUISING OR BLEEDING  TENDERNESS IN MOUTH AND THROAT WITH OR WITHOUT PRESENCE OF ULCERS  *URINARY PROBLEMS  *BOWEL PROBLEMS  UNUSUAL RASH Items with * indicate a potential emergency and should be followed up as soon as possible.  Feel free to call the clinic you have any questions or concerns. The clinic phone number is [phone].  Please show the [facility] at check-in to the Emergency Department and triage nurse.

## 2016-06-06 NOTE — Progress Notes (Signed)
[place]  [phone]) [phone] [phone]  Clinic Follow Up Note    06/06/2016  CHIEF COMPLAINTS:  Follow Up colon cancer  Oncology History   T2, Colon cancer metastasized to lung [facility])   Staging form: Colon and Rectum, AJCC 7th Edition     Clinical stage from 03/30/2015: Stage Unknown ([place], N1, M1) - Unsigned       Colon cancer metastasized to lung [facility]) s/p laparoscopic assisted sigmoid colectomy 11/02/15   03/30/2015 Miscellaneous    Foundation one genomic testing showed TP53 mutation, MSI stable, low tumor mutation burden. Negative for K-ras, NRAS and BRAF      03/30/2015 Initial Biopsy    Sigmoid mass biopsy showed invasive adenocarcinoma. Cecal colon polyps showed tubular adenoma.      03/30/2015 Initial Diagnosis    Colon cancer      03/30/2015 Procedure    colonoscopy by Dr. Michail Sermon showed a fungating, infiltrative and ulcerated nonobstructing large mass in the sigmoid colon and at 20 cm proximal to the anus. The mass was partially circumferential no bleeding. A 10 mm polyps in the cecum was removed.      04/03/2015 Imaging    CT chest, abdomen and pelvis with contrast showed nodular masslike area of clinical worsening at rectosigmoid junction, tiny pericolonic lymph nodes, bilateral pulmonary nodules measuring about 1 cm.      04/12/2015 PET scan    Hypermetabolic colonic mass near rectosigmoid junction, tiny subcentimeter paracolonic lymph nodes. Bilateral pulmonary metastasis.      04/19/2015 Procedure    CT-guided lung nodule biopsy attempted, unsuccessful.      04/27/2015 - 09/14/2015 Chemotherapy    Oxaliplatin 130 mg/m on day 1, Capecitabine 2379m q12hr, 2 weeks on and one week off (only received 7 days for first cycle), oxaliplatin held on cycle 5 and dose reduced to 1057mm2, capecitabine reduced to 200072m12h D1-14, stopped per pt's request.       06/29/2015 - 10/19/2015 Chemotherapy     Panitumumab every 2 weeks, some  cycles were postponed due to pt's request, stoppe per pt's request       09/18/2015 Imaging    Pulmonary nodules are less hypermetabolic, stable size. Stable hypermetabolic portal hepatis and abdominal peritoneal ligament lymph nodes. No other new lesions.       11/02/2015 Surgery    sigmoid colon segmental resection       11/02/2015 Pathology Results    Sigmoid colon segmental resection showed adenocarcinoma, grade 3,  T2, 3 out of 13 lymph nodes were positive, surgical margins were negative. LVI(-), perineural invasion negative      11/13/2015 Imaging    CT chest, abdomen and pelvis with contrast showed postsurgical changes, mild progression of pulmonary metastasis, measuring up to 15 mm in the right lower lobe.      11/29/2015 - 12/05/2015 Radiation Therapy    SBRT to 4 lung lesions in 3 sessions       04/25/2016 -  Chemotherapy    FOLFIRI every 2 weeks, and Avastin added from cycle 3         HISTORY OF PRESENTING ILLNESS:  Bill Wright 55o. male is here because of recently newly diagnosed colon cancer.  He has had intermittent bloody stool for 2 years, it has been mild, mixed with stool, patient does not have any abdominal pain, constipation, change of his bowel habits, nausea, weight loss or other symptoms. He did not seek medical attention for this. He went to emergency room on  03/15/2015 for right flank pain, due to his kidney stone. CT scan incidentally found a 11 mm right lower lobe nodule and mucosal edema in the sigmoid colon. He saw his primary care physician, and was referred to GI Dr. Michail Sermon here at he underwent colonoscopy on 03/30/2015, which showed a fungating infiltrative and ulcerated nonobstructing large mass in the sigmoid colon, biopsy showed adenocarcinoma. CT chest abdomen and pelvis showed multiple lung nodules measuring about 1 cm. He was referred to surgeon Dr. Zella Richer, who referred patient to [place] for further workup of his lung nodule and discuss  chemotherapy.  He feels very well overall, denies any symptoms. 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 He never had screening colonoscopy prior the reason one, no significant past medical history, does not see doctors regularly.  CURRENT TREATMENT: chemotherapy Irinotecan and Avastin, started on 04/25/2016, he tried FOLFIRI for cycle 2 but tolerated poorly   INTERIM HISTORY Ido returns for follow-up and chemo He tolerated Avastin well without issues. But he complains of fatigue, slow movements and reactions after last cycle Irinotecan, which lasted for a week, he was able to go to work, but feels sluggish, has recovered well since last week  No nausea, diarrhea, or fever, weight stable Cough much improved, minimum now    MEDICAL HISTORY:  Past Medical History:  Diagnosis Date  . Anxiety    situational due to cancer diagnosis  . Cancer [facility]) 2017   colon-chemo 09/22/15 now surgery  . Hypercholesteremia   . Kidney calculi     SURGICAL HISTORY: Past Surgical History:  Procedure Laterality Date  . COLONOSCOPY  03/30/15  . LAPAROSCOPIC PARTIAL COLECTOMY N/A 11/02/2015   Procedure: LAPAROSCOPIC ASSISTED SIGMOID COLECTOMY;  Surgeon: Jackolyn Confer, MD;  Location: [facility]S;  Service: General;  Laterality: N/A;  . PORTACATH PLACEMENT N/A 05/08/2016   Procedure: INSERTION PORT-A-CATH;  Surgeon: Jackolyn Confer, MD;  Location: [facility];  Service: General;  Laterality: N/A;    SOCIAL HISTORY: Social History   Social History  . Marital Status: Married    Spouse Name: N/A  . Number of Children: 3, age of 21, 2 and 20   . Years of Education: N/A   Occupational History  . Banker for [employer] [place]    Social History Main Topics  . Smoking status: Never Smoker   . Smokeless tobacco: Not on file  . Alcohol Use: No  . Drug Use: No  . Sexual Activity: Not on file   Other Topics Concern  . Not on file   Social History Narrative    FAMILY  HISTORY: Family History  Problem Relation Age of Onset  . Breast cancer Mother 1    +rad and lymph node  . Diabetes Maternal Grandmother     leading to blindness  . Obesity Maternal Aunt   . Stroke Maternal Uncle 65    ALLERGIES:  has No Known Allergies.  MEDICATIONS:  Current Outpatient Prescriptions  Medication Sig Dispense Refill  . ALPRAZolam (XANAX) 0.25 MG tablet Take 1 tablet (0.25 mg total) by mouth 3 (three) times daily as needed for anxiety. 30 tablet 0  . clindamycin (CLINDAGEL) 1 % gel Apply topically 2 (two) times daily. 30 g 5  . cyclobenzaprine (FLEXERIL) 5 MG tablet Take 1 tablet (5 mg total) by mouth 3 (three) times daily as needed for muscle spasms. 30 tablet 0  . HYDROcodone-acetaminophen (NORCO/VICODIN) 5-325 MG tablet Take 1-2 tablets by mouth every 4 (  four) hours as needed for moderate pain or severe pain. 15 tablet 0  . hydrocortisone 2.5 % lotion APPLY EXTERNALLY TO THE AFFECTED AREA TWICE DAILY 200 mL 0  . ibuprofen (ADVIL,MOTRIN) 200 MG tablet Take 600 mg by mouth every 6 (six) hours as needed.    . lidocaine-prilocaine (EMLA) cream Apply 1 application topically as needed. 30 g 1  . ondansetron (ZOFRAN) 8 MG tablet Take 1 tablet (8 mg total) by mouth 2 (two) times daily as needed for refractory nausea / vomiting. Start on day 3 after chemotherapy. 30 tablet 1  . prochlorperazine (COMPAZINE) 10 MG tablet Take 1 tablet (10 mg total) by mouth every 6 (six) hours as needed (NAUSEA). 30 tablet 2  . HYDROMET 5-1.5 MG/5ML syrup TK 5 MLS PO Q 6 H PRF COUGH  0   No current facility-administered medications for this visit.    Facility-Administered Medications Ordered in Other Visits  Medication Dose Route Frequency Provider Last Rate Last Dose  . atropine injection 0.5 mg  0.5 mg Intravenous Once PRN Truitt Merle, MD      . heparin lock flush 100 unit/mL  500 Units Intracatheter Once PRN Truitt Merle, MD      . irinotecan (CAMPTOSAR) 340 mg in dextrose 5 % 500 mL chemo  infusion  160 mg/m2 (Treatment Plan Recorded) Intravenous Once Truitt Merle, MD      . sodium chloride flush (NS) 0.9 % injection 10 mL  10 mL Intracatheter PRN Truitt Merle, MD        REVIEW OF SYSTEMS:   Constitutional: Denies fevers, chills or abnormal night sweats, no weight loss. Eyes: Denies blurriness of vision, double vision or watery eyes Ears, nose, mouth, throat, and face: Denies mucositis or sore throat Respiratory: Denies cough, dyspnea or wheezes Cardiovascular: Denies palpitation, chest discomfort or lower extremity swelling Gastrointestinal:  Denies nausea, heartburn or change in bowel habits Skin: Denies abnormal skin rashes Lymphatics: Denies new lymphadenopathy or easy bruising Neurological:Denies numbness, tingling or new weaknesses Behavioral/Psych: Mood is stable, no new changes  All other systems were reviewed with the patient and are negative.  PHYSICAL EXAMINATION: ECOG PERFORMANCE STATUS: 1 BP 130/78 (BP Location: Left Arm, Patient Position: Sitting)   Pulse 91   Temp 98.6 F (37 C) (Oral)   Resp 18   Ht _0  (1.676 m)   Wt 217 lb 14.4 oz (98.8 kg)   SpO2 100%   BMI 35.17 kg/m  GENERAL:alert, no distress and comfortable SKIN: skin color, texture, turgor are normal, diffuse acne-like rash on his front chest and back, scattered rash on his neck and face, there is a healing large boil above his upper lip, no discharge  EYES: normal, conjunctiva are pink and non-injected, sclera clear OROPHARYNX:no exudate, no erythema and lips, buccal mucosa, and tongue normal  NECK: supple, thyroid normal size, non-tender, without nodularity LYMPH:  no palpable lymphadenopathy in the cervical, axillary or inguinal LUNGS: clear to auscultation and percussion with normal breathing effort HEART: regular rate & rhythm and no murmurs and no lower extremity edema ABDOMEN:abdomen soft, non-tender and normal bowel sounds. The midline incision has healed well, no discharge or skin  erythema  Musculoskeletal:no cyanosis of digits and no clubbing  PSYCH: alert & oriented x 3 with fluent speech NEURO: no focal motor/sensory deficits  LABORATORY DATA:  I have reviewed the data as listed CBC Latest Ref Rng & Units 06/06/2016 05/23/2016 05/09/2016  WBC 4.0 - 10.3 10e3/uL 5.7 4.7 12.7(H)  Hemoglobin 13.0 -  17.1 g/dL 12.4(L) 12.3(L) 11.4(L)  Hematocrit 38.4 - 49.9 % 37.2(L) 36.6(L) 34.7(L)  Platelets 140 - 400 10e3/uL 201 189 265   CMP Latest Ref Rng & Units 06/06/2016 05/23/2016 05/09/2016  Glucose 70 - 140 mg/dl 121 114 173(H)  BUN 7.0 - 26.0 mg/dL 10.1 12.1 13.2  Creatinine 0.7 - 1.3 mg/dL 0.8 0.8 0.8  Sodium 136 - 145 mEq/L 141 142 138  Potassium 3.5 - 5.1 mEq/L 4.2 4.2 4.3  Chloride 101 - 111 mmol/L - - -  CO2 22 - 29 mEq/L _0 Calcium 8.4 - 10.4 mg/dL 9.7 9.5 9.0  Total Protein 6.4 - 8.3 g/dL 6.9 7.1 6.6  Total Bilirubin 0.20 - 1.20 mg/dL 0.43 0.53 0.26  Alkaline Phos 40 - 150 U/L 96 94 102  AST 5 - 34 U/L _1 ALT 0 - 55 U/L 33 29 26   CEA:  04/20/2016: <0.5 06/08/2015: <0.5 09/07/2015: <0.5 11/15/2015: 0.9 02/16/2016: <1.0  04/25/2016: 1.6  PATHOLOGY REPORT  Diagnosis 11/02/2015 1. Colon, segmental resection for tumor, sigmoid ADENOCARCINOMA OF THE SIGMOID COLON (2.0 CM), GRADE 3 THE TUMOR INVADES MUSCULARIS PROPRIA MARGINS OF RESECTION ARE NEGATIVE METASTATIC ADENOCARCINOMA IN THREE OF THIRTEEN LYMPH NODES (3/13) 2. Colon, resection margin (donut), distal sigmoid BENIGN COLONIC TISSUE Microscopic Comment 1. COLON AND RECTUM (INCLUDING TRANS-ANAL RESECTION): Specimen: Sigmoid Procedure: Segmental resection Tumor site: sigmoid Specimen integrity: Intact Macroscopic intactness of mesorectum: Not applicable: x Complete: NA Near complete: NA Incomplete: NA Cannot be determined (specify): NA Macroscopic tumor perforation: Muscularis Invasive tumor: Maximum size: 2.0 cm Histologic type(s): Adenocarcinoma Histologic grade and  differentiation: G3 G1: well differentiated/low grade G2: moderately differentiated/low grade G3: poorly differentiated/high grade G4: undifferentiated/high grade Type of polyp in which invasive carcinoma arose: Tubular adenoma Microscopic extension of invasive tumor: Muscularis propria Lymph-Vascular invasion: Negative Peri-neural invasion: Negative Tumor deposit(s) (discontinuous extramural extension): Negative Resection margins: Proximal margin: Negative Distal margin: Negative Circumferential (radial) (posterior ascending, posterior descending; lateral and posterior mid-rectum; and entire lower 1/3 rectum):Negative Mesenteric margin (sigmoid and transverse): Negative Distance closest margin (if all above margins negative): 3.5 cm Trans-anal resection margins only: Deep margin: NA Mucosal Margin: NA Distance closest mucosal margin (if negative): NA Treatment effect (neo-adjuvant therapy): Partial Additional polyp(s): None Non-neoplastic findings: unremarkable Lymph nodes: number examined 13; number positive: 3 Pathologic Staging: T2, N1b, M1a     RADIOGRAPHIC STUDIES: I have personally reviewed the radiological images as listed and agreed with the findings in the report.  PET 04/12/2015 IMPRESSION: Hypermetabolic colonic mass near rectosigmoid junction, consistent with known primary colon carcinoma.  Tiny sub-cm pericolonic lymph nodes in sigmoid mesocolon are too small to characterize by PET, but are suspicious for early lymph node metastases.  Bilateral pulmonary metastases  PET 09/18/2015 IMPRESSION: 1. Hypermetabolic pulmonary metastases have enlarged slightly from 07/18/2015. 2. Hypermetabolic porta hepatis/abdominal peritoneal ligament lymph nodes, stable from 04/12/2015. 3. Increase in hypermetabolism associated with mesenteric haziness and nodularity. While a reactive phenomenon/panniculitis can create this in appearance, metastatic  disease/lymphoproliferative disorder cannot be excluded. 4. Hepatic steatosis. 5. Bilateral nephrolithiasis.  CT chest, abdomen and pelvis with contrast 02/09/2016 IMPRESSION: 1. Pulmonary nodules that appear to be within the radiation fields appear slightly smaller on today's study. The patient does have nodules liver progressed in the interval including a 6 mm posterior left upper lobe nodule and an 11 mm nodule in the posterior medial aspect of the right lower lobe. 2. No evidence for metastatic disease in the abdomen or pelvis. 3.  Nonobstructing right renal stone. 4. Edema and stranding around the colonic anastomosis is decreased in the interval.  Ct chest wo contrast 04/05/2016 IMPRESSION: Progressive radiation changes in posterior right upper lobe and superior right lower lobe.  12 mm nodular opacity in the superior segment right lower lobe, mildly increased, indeterminate.  Scattered left lung nodules measuring up to 8 mm, suspicious for metastases, mildly progressed.   ASSESSMENT & PLAN:  55 year old male, without significant past medical history except kidney stone, presented with intermittent bloody stool for 2 years, and colonoscopy showed a large sigmoid colon mass, CT scan showed multiple (at least 4) nodules in bilateral lungs, measuring about 1 cm.  1. Sigmoid colon adenocarcinoma, [id number], probably stage IV with lung mets  -I previously reviewed his colonoscopy, CT scan findings and the biopsy results in great details with patient and his wife. -I reviewed his surgical pathology findings, which showed a residual T2 primary tumor, 3 lymph nodes positive, grade 3 disease, certainly high risk disease.  -We discussed that his disease is likely incurable, and the goal of therapy is maximum disease control and prolong his life.  -- His tumor does not contain KRAS/NRAS or BRAF mutation, so he would benefit from EGFR antibody, Panitumumab was added on from cycle 4. He  received a total of 4 month of chemotherapy which was held per patient's request.He declined more chemo after his colon surgery.  --I personally reviewed his CT scan image from  04/05/2016 and compared with prior CT with pt in person, the previous radiated area in the superior segment right lower lobe has developed infiltrative change, I suspect this could be cancer progression, although radiation pneumonitis is still also possibility, but it would be unusual giving the timeframe after radiation 4- 5 months ago. He also has a few other small lung nodules has slightly increased in size. -Clinically he has persistent, slightly worse dry cough. He completed a course of prednisone, his cough is not typical from radiation pneumonitis. -I discussed with Dr. Lisbeth Renshaw about his recent CT scan findings, he feels his CT scan finding is not very typical for radiation pneumonitis. He will hold on further SBRT for now. -He has started chemotherapy, cough much improved -He did not tolerate FOLFIRI well, refuses to continue, but agrees to continue irinotecan and Avastin  -Lab review, adequately to treatment, will proceed Cycle 4 irinotecan today  -continue Avastin every 2 weeks, on a different day -He would like to have chemo breast after this cycle chemo for coming holidays, will repeat scan CT scan in 3 weeks   2. Dry cough -His initial cough is likely related to his SBRT,he received a course of prednisone  -I'm concerned he is dry cough to be related to his cancer progression in the lung  -Much improved since he started chemotherapy  3. Obesity  -I encouraged him to eat healthy and exercise   4. Chest tightness, back pain -he has had some shoulder and upper back tightness lately, he has been taking hydrocodone for that -EKG was unremarkable -This is likely muscular related, resolved lately   Plan -C4 irinotecan today, chemo break afterwards -lab and CT on 12/21 and see me on 12/22    I spent 20 minutes  counseling the patient face to face. The total time spent in the appointment was 25 minutes and more than 50% was on counseling.     Truitt Merle, MD 06/06/2016

## 2016-06-17 ENCOUNTER — Telehealth: Payer: Self-pay | Admitting: General Practice

## 2016-06-17 ENCOUNTER — Ambulatory Visit (HOSPITAL_BASED_OUTPATIENT_CLINIC_OR_DEPARTMENT_OTHER): Payer: BLUE CROSS/BLUE SHIELD

## 2016-06-17 VITALS — BP 131/87 | HR 93 | Temp 98.2°F | Resp 18

## 2016-06-17 DIAGNOSIS — C187 Malignant neoplasm of sigmoid colon: Secondary | ICD-10-CM

## 2016-06-17 DIAGNOSIS — C189 Malignant neoplasm of colon, unspecified: Secondary | ICD-10-CM

## 2016-06-17 DIAGNOSIS — Z5112 Encounter for antineoplastic immunotherapy: Secondary | ICD-10-CM | POA: Diagnosis not present

## 2016-06-17 DIAGNOSIS — C7800 Secondary malignant neoplasm of unspecified lung: Principal | ICD-10-CM

## 2016-06-17 MED ORDER — SODIUM CHLORIDE 0.9 % IV SOLN
Freq: Once | INTRAVENOUS | Status: AC
  Administered 2016-06-17: 15:00:00 via INTRAVENOUS

## 2016-06-17 MED ORDER — SODIUM CHLORIDE 0.9% FLUSH
10.0000 mL | INTRAVENOUS | Status: DC | PRN
  Administered 2016-06-17: 10 mL
  Filled 2016-06-17: qty 10

## 2016-06-17 MED ORDER — HEPARIN SOD (PORK) LOCK FLUSH 100 UNIT/ML IV SOLN
500.0000 [IU] | Freq: Once | INTRAVENOUS | Status: AC | PRN
  Administered 2016-06-17: 500 [IU]
  Filled 2016-06-17: qty 5

## 2016-06-17 MED ORDER — SODIUM CHLORIDE 0.9 % IV SOLN
5.2000 mg/kg | Freq: Once | INTRAVENOUS | Status: AC
  Administered 2016-06-17: 500 mg via INTRAVENOUS
  Filled 2016-06-17: qty 16

## 2016-06-17 NOTE — Patient Instructions (Signed)
[facility] Discharge Instructions for Patients Receiving Chemotherapy  Today you received the following chemotherapy agents Avastin   To help prevent nausea and vomiting after your treatment, we encourage you to take your nausea medication as directed.    If you develop nausea and vomiting that is not controlled by your nausea medication, call the clinic.   BELOW ARE SYMPTOMS THAT SHOULD BE REPORTED IMMEDIATELY:  *FEVER GREATER THAN 100.5 F  *CHILLS WITH OR WITHOUT FEVER  NAUSEA AND VOMITING THAT IS NOT CONTROLLED WITH YOUR NAUSEA MEDICATION  *UNUSUAL SHORTNESS OF BREATH  *UNUSUAL BRUISING OR BLEEDING  TENDERNESS IN MOUTH AND THROAT WITH OR WITHOUT PRESENCE OF ULCERS  *URINARY PROBLEMS  *BOWEL PROBLEMS  UNUSUAL RASH Items with * indicate a potential emergency and should be followed up as soon as possible.  Feel free to call the clinic you have any questions or concerns. The clinic phone number is [phone].  Please show the CHEMO ALERT CARD at check-in to the Emergency Department and triage nurse.   

## 2016-06-17 NOTE — Telephone Encounter (Signed)
Spoke with pt confirmed December 2018 appts.

## 2016-06-18 NOTE — Progress Notes (Signed)
[place]  [phone] [phone]  Clinic Follow Up Note    06/20/2016  CHIEF COMPLAINTS:  Follow Up colon cancer  Oncology History   T2, Colon cancer metastasized to lung [facility])   Staging form: Colon and Rectum, AJCC 7th Edition     Clinical stage from 03/30/2015: Stage Unknown ([place], N1, M1) - Unsigned       Colon cancer metastasized to lung [facility]) s/p laparoscopic assisted sigmoid colectomy 11/02/15   03/30/2015 Miscellaneous    Foundation one genomic testing showed TP53 mutation, MSI stable, low tumor mutation burden. Negative for K-ras, NRAS and BRAF      03/30/2015 Initial Biopsy    Sigmoid mass biopsy showed invasive adenocarcinoma. Cecal colon polyps showed tubular adenoma.      03/30/2015 Initial Diagnosis    Colon cancer      03/30/2015 Procedure    colonoscopy by Dr. Michail Sermon showed a fungating, infiltrative and ulcerated nonobstructing large mass in the sigmoid colon and at 20 cm proximal to the anus. The mass was partially circumferential no bleeding. A 10 mm polyps in the cecum was removed.      04/03/2015 Imaging    CT chest, abdomen and pelvis with contrast showed nodular masslike area of clinical worsening at rectosigmoid junction, tiny pericolonic lymph nodes, bilateral pulmonary nodules measuring about 1 cm.      04/12/2015 PET scan    Hypermetabolic colonic mass near rectosigmoid junction, tiny subcentimeter paracolonic lymph nodes. Bilateral pulmonary metastasis.      04/19/2015 Procedure    CT-guided lung nodule biopsy attempted, unsuccessful.      04/27/2015 - 09/14/2015 Chemotherapy    Oxaliplatin 130 mg/m on day 1, Capecitabine 2370m q12hr, 2 weeks on and one week off (only received 7 days for first cycle), oxaliplatin held on cycle 5 and dose reduced to 1040mm2, capecitabine reduced to 200013m12h D1-14, stopped per pt's request.       06/29/2015 - 10/19/2015 Chemotherapy     Panitumumab every 2 weeks, some  cycles were postponed due to pt's request, stoppe per pt's request       09/18/2015 Imaging    Pulmonary nodules are less hypermetabolic, stable size. Stable hypermetabolic portal hepatis and abdominal peritoneal ligament lymph nodes. No other new lesions.       11/02/2015 Surgery    sigmoid colon segmental resection       11/02/2015 Pathology Results    Sigmoid colon segmental resection showed adenocarcinoma, grade 3,  T2, 3 out of 13 lymph nodes were positive, surgical margins were negative. LVI(-), perineural invasion negative      11/13/2015 Imaging    CT chest, abdomen and pelvis with contrast showed postsurgical changes, mild progression of pulmonary metastasis, measuring up to 15 mm in the right lower lobe.      11/29/2015 - 12/05/2015 Radiation Therapy    SBRT to 4 lung lesions in 3 sessions       04/25/2016 -  Chemotherapy    FOLFIRI every 2 weeks, and Avastin added from cycle 3         HISTORY OF PRESENTING ILLNESS:  Bill Wright 55o. male is here because of recently newly diagnosed colon cancer.  He has had intermittent bloody stool for 2 years, it has been mild, mixed with stool, patient does not have any abdominal pain, constipation, change of his bowel habits, nausea, weight loss or other symptoms. He did not seek medical attention for this. He went to emergency room on  03/15/2015 for right flank pain, due to his kidney stone. CT scan incidentally found a 11 mm right lower lobe nodule and mucosal edema in the sigmoid colon. He saw his primary care physician, and was referred to GI Dr. Michail Sermon here at he underwent colonoscopy on 03/30/2015, which showed a fungating infiltrative and ulcerated nonobstructing large mass in the sigmoid colon, biopsy showed adenocarcinoma. CT chest abdomen and pelvis showed multiple lung nodules measuring about 1 cm. He was referred to surgeon Dr. Zella Richer, who referred patient to [place] for further workup of his lung nodule and discuss  chemotherapy.  He feels very well overall, denies any symptoms. He is a Freight forwarder at [employer], lives with his wife and 3 children. He never had screening colonoscopy prior the reason one, no significant past medical history, does not see doctors regularly.  CURRENT TREATMENT: chemotherapy Irinotecan and Avastin, started on 04/25/2016, he tried FOLFIRI for cycle 2 but tolerated poorly   INTERIM HISTORY Bill Wright returns for follow-up and chemo. Reports being lethargic after his last chemo. Got treatment on Thursday, went to work and felt his energy go down. States his energy went down for about 4-5 days and then recovered. Still able to work. Feels like he is moving in slow motion. States he was unable to sleep for 4 days after chemo. States he woke up every 2 hours. Denies rash. CT scan did not happen, states he never got a call. States his cough has resolved by about 90%.   MEDICAL HISTORY:  Past Medical History:  Diagnosis Date  . Anxiety    situational due to cancer diagnosis  . Cancer [facility]) 2017   colon-chemo 09/22/15 now surgery  . Hypercholesteremia   . Kidney calculi     SURGICAL HISTORY: Past Surgical History:  Procedure Laterality Date  . COLONOSCOPY  03/30/15  . LAPAROSCOPIC PARTIAL COLECTOMY N/A 11/02/2015   Procedure: LAPAROSCOPIC ASSISTED SIGMOID COLECTOMY;  Surgeon: Jackolyn Confer, MD;  Location: [facility];  Service: General;  Laterality: N/A;  . PORTACATH PLACEMENT N/A 05/08/2016   Procedure: INSERTION PORT-A-CATH;  Surgeon: Jackolyn Confer, MD;  Location: [facility];  Service: General;  Laterality: N/A;    SOCIAL HISTORY: Social History   Social History  . Marital Status: Married    Spouse Name: N/A  . Number of Children: 3, age of 13, 56 and 51   . Years of Education: N/A   Occupational History  . Banker for [employer] [place]    Social History Main Topics  . Smoking status: Never Smoker   . Smokeless tobacco: Not on file  . Alcohol Use: No  . Drug Use: No  .  Sexual Activity: Not on file   Other Topics Concern  . Not on file   Social History Narrative    FAMILY HISTORY: Family History  Problem Relation Age of Onset  . Breast cancer Mother 36    +rad and lymph node  . Diabetes Maternal Grandmother     leading to blindness  . Obesity Maternal Aunt   . Stroke Maternal Uncle 65    ALLERGIES:  has No Known Allergies.  MEDICATIONS:  Current Outpatient Prescriptions  Medication Sig Dispense Refill  . ALPRAZolam (XANAX) 0.25 MG tablet Take 1 tablet (0.25 mg total) by mouth 3 (three) times daily as needed for anxiety. 30 tablet 0  . clindamycin (CLINDAGEL) 1 % gel Apply topically 2 (two) times daily. 30 g 5  . cyclobenzaprine (FLEXERIL) 5 MG tablet Take 1 tablet (5 mg  total) by mouth 3 (three) times daily as needed for muscle spasms. 30 tablet 0  . HYDROcodone-acetaminophen (NORCO/VICODIN) 5-325 MG tablet Take 1-2 tablets by mouth every 4 (four) hours as needed for moderate pain or severe pain. 15 tablet 0  . hydrocortisone 2.5 % lotion APPLY EXTERNALLY TO THE AFFECTED AREA TWICE DAILY 200 mL 0  . HYDROMET 5-1.5 MG/5ML syrup TK 5 MLS PO Q 6 H PRF COUGH  0  . ibuprofen (ADVIL,MOTRIN) 200 MG tablet Take 600 mg by mouth every 6 (six) hours as needed.    . lidocaine-prilocaine (EMLA) cream Apply 1 application topically as needed. 30 g 1  . ondansetron (ZOFRAN) 8 MG tablet Take 1 tablet (8 mg total) by mouth 2 (two) times daily as needed for refractory nausea / vomiting. Start on day 3 after chemotherapy. 30 tablet 1  . prochlorperazine (COMPAZINE) 10 MG tablet Take 1 tablet (10 mg total) by mouth every 6 (six) hours as needed (NAUSEA). 30 tablet 2   No current facility-administered medications for this visit.    Facility-Administered Medications Ordered in Other Visits  Medication Dose Route Frequency Provider Last Rate Last Dose  . sodium chloride flush (NS) 0.9 % injection 10 mL  10 mL Intravenous PRN Truitt Merle, MD   10 mL at 06/20/16 4098     REVIEW OF SYSTEMS: Constitutional: Denies fevers, chills or abnormal night sweats, no weight loss. Eyes: Denies blurriness of vision, double vision or watery eyes Ears, nose, mouth, throat, and face: Denies mucositis or sore throat Respiratory: Denies cough, dyspnea or wheezes Cardiovascular: Denies palpitation, chest discomfort or lower extremity swelling Gastrointestinal:  Denies nausea, heartburn or change in bowel habits Skin: Denies abnormal skin rashes Lymphatics: Denies new lymphadenopathy or easy bruising Neurological:Denies numbness, tingling or new weaknesses Behavioral/Psych: Mood is stable, no new changes  All other systems were reviewed with the patient and are negative.  PHYSICAL EXAMINATION: ECOG PERFORMANCE STATUS: 1 BP 132/78 (BP Location: Left Arm, Patient Position: Sitting)   Pulse (!) 101   Temp 98.6 F (37 C) (Oral)   Resp 18   Ht _0  (1.676 m)   Wt 217 lb (98.4 kg)   SpO2 99%   BMI 35.02 kg/m  GENERAL:alert, no distress and comfortable SKIN: skin color, texture, turgor are normal. No rash noted on the face. EYES: normal, conjunctiva are pink and non-injected, sclera clear OROPHARYNX:no exudate, no erythema and lips, buccal mucosa, and tongue normal  NECK: supple, thyroid normal size, non-tender, without nodularity LYMPH:  no palpable lymphadenopathy in the cervical, axillary or inguinal LUNGS: clear to auscultation and percussion with normal breathing effort HEART: regular rate & rhythm and no murmurs and no lower extremity edema ABDOMEN:abdomen soft, non-tender and normal bowel sounds. The midline incision has healed well, no discharge or skin erythema  Musculoskeletal:no cyanosis of digits and no clubbing  PSYCH: alert & oriented x 3 with fluent speech NEURO: no focal motor/sensory deficits  LABORATORY DATA:  I have reviewed the data as listed CBC Latest Ref Rng & Units 06/20/2016 06/06/2016 05/23/2016  WBC 4.0 - 10.3 10e3/uL 6.7 5.7 4.7   Hemoglobin 13.0 - 17.1 g/dL 12.6(L) 12.4(L) 12.3(L)  Hematocrit 38.4 - 49.9 % 37.6(L) 37.2(L) 36.6(L)  Platelets 140 - 400 10e3/uL 211 201 189   CMP Latest Ref Rng & Units 06/20/2016 06/06/2016 05/23/2016  Glucose 70 - 140 mg/dl 117 121 114  BUN 7.0 - 26.0 mg/dL 11.0 10.1 12.1  Creatinine 0.7 - 1.3 mg/dL 0.9 0.8 0.8  Sodium 136 - 145 mEq/L 141 141 142  Potassium 3.5 - 5.1 mEq/L 4.1 4.2 4.2  Chloride 101 - 111 mmol/L - - -  CO2 22 - 29 mEq/L _0 Calcium 8.4 - 10.4 mg/dL 9.5 9.7 9.5  Total Protein 6.4 - 8.3 g/dL 7.1 6.9 7.1  Total Bilirubin 0.20 - 1.20 mg/dL 0.42 0.43 0.53  Alkaline Phos 40 - 150 U/L 105 96 94  AST 5 - 34 U/L _1 ALT 0 - 55 U/L 35 33 29   CEA:  04/20/2016: <0.5 06/08/2015: <0.5 09/07/2015: <0.5 11/15/2015: 0.9 02/16/2016: <1.0  04/25/2016: 1.6 05/23/16: <1.00  PATHOLOGY REPORT  Diagnosis 11/02/2015 1. Colon, segmental resection for tumor, sigmoid ADENOCARCINOMA OF THE SIGMOID COLON (2.0 CM), GRADE 3 THE TUMOR INVADES MUSCULARIS PROPRIA MARGINS OF RESECTION ARE NEGATIVE METASTATIC ADENOCARCINOMA IN THREE OF THIRTEEN LYMPH NODES (3/13) 2. Colon, resection margin (donut), distal sigmoid BENIGN COLONIC TISSUE Microscopic Comment 1. COLON AND RECTUM (INCLUDING TRANS-ANAL RESECTION): Specimen: Sigmoid Procedure: Segmental resection Tumor site: sigmoid Specimen integrity: Intact Macroscopic intactness of mesorectum: Not applicable: x Complete: NA Near complete: NA Incomplete: NA Cannot be determined (specify): NA Macroscopic tumor perforation: Muscularis Invasive tumor: Maximum size: 2.0 cm Histologic type(s): Adenocarcinoma Histologic grade and differentiation: G3 G1: well differentiated/low grade G2: moderately differentiated/low grade G3: poorly differentiated/high grade G4: undifferentiated/high grade Type of polyp in which invasive carcinoma arose: Tubular adenoma Microscopic extension of invasive tumor: Muscularis  propria Lymph-Vascular invasion: Negative Peri-neural invasion: Negative Tumor deposit(s) (discontinuous extramural extension): Negative Resection margins: Proximal margin: Negative Distal margin: Negative Circumferential (radial) (posterior ascending, posterior descending; lateral and posterior mid-rectum; and entire lower 1/3 rectum):Negative Mesenteric margin (sigmoid and transverse): Negative Distance closest margin (if all above margins negative): 3.5 cm Trans-anal resection margins only: Deep margin: NA Mucosal Margin: NA Distance closest mucosal margin (if negative): NA Treatment effect (neo-adjuvant therapy): Partial Additional polyp(s): None Non-neoplastic findings: unremarkable Lymph nodes: number examined 13; number positive: 3 Pathologic Staging: T2, N1b, M1a     RADIOGRAPHIC STUDIES: I have personally reviewed the radiological images as listed and agreed with the findings in the report.  PET 04/12/2015 IMPRESSION: Hypermetabolic colonic mass near rectosigmoid junction, consistent with known primary colon carcinoma.  Tiny sub-cm pericolonic lymph nodes in sigmoid mesocolon are too small to characterize by PET, but are suspicious for early lymph node metastases.  Bilateral pulmonary metastases  PET 09/18/2015 IMPRESSION: 1. Hypermetabolic pulmonary metastases have enlarged slightly from 07/18/2015. 2. Hypermetabolic porta hepatis/abdominal peritoneal ligament lymph nodes, stable from 04/12/2015. 3. Increase in hypermetabolism associated with mesenteric haziness and nodularity. While a reactive phenomenon/panniculitis can create this in appearance, metastatic disease/lymphoproliferative disorder cannot be excluded. 4. Hepatic steatosis. 5. Bilateral nephrolithiasis.  CT chest, abdomen and pelvis with contrast 02/09/2016 IMPRESSION: 1. Pulmonary nodules that appear to be within the radiation fields appear slightly smaller on today's study. The patient  does have nodules liver progressed in the interval including a 6 mm posterior left upper lobe nodule and an 11 mm nodule in the posterior medial aspect of the right lower lobe. 2. No evidence for metastatic disease in the abdomen or pelvis. 3. Nonobstructing right renal stone. 4. Edema and stranding around the colonic anastomosis is decreased in the interval.  Ct chest wo contrast 04/05/2016 IMPRESSION: Progressive radiation changes in posterior right upper lobe and superior right lower lobe.  12 mm nodular opacity in the superior segment right lower lobe, mildly increased, indeterminate.  Scattered left lung nodules measuring up to  8 mm, suspicious for metastases, mildly progressed.   ASSESSMENT & PLAN:  55 y.o. male, without significant past medical history except kidney stone, presented with intermittent bloody stool for 2 years, and colonoscopy showed a large sigmoid colon mass, CT scan showed multiple (at least 4) nodules in bilateral lungs, measuring about 1 cm.  1. Sigmoid colon adenocarcinoma, [id number], probably stage IV with lung mets  -I previously reviewed his colonoscopy, CT scan findings and the biopsy results in great details with patient and his wife. -I previously reviewed his surgical pathology findings, which showed a residual T2 primary tumor, 3 lymph nodes positive, grade 3 disease, certainly high risk disease.  -We discussed that his disease is likely incurable, and the goal of therapy is maximum disease control and prolong his life.  -- His tumor does not contain KRAS/NRAS or BRAF mutation, so he would benefit from EGFR antibody, Panitumumab was added on from cycle 4. He received a total of 4 month of chemotherapy which was held per patient's request.He declined more chemo after his colon surgery.  --I previously personally reviewed his CT scan image from  04/05/2016 and compared with prior CT with pt in person, the previous radiated area in the superior segment  right lower lobe has developed infiltrative change, I suspect this could be cancer progression, although radiation pneumonitis is still also possibility, but it would be unusual giving the timeframe after radiation 4- 5 months ago. He also has a few other small lung nodules has slightly increased in size. -Clinically he had a persistent, slightly worse dry cough. He completed a course of prednisone, his cough was not typical from radiation pneumonitis. -I previously discussed with Dr. Lisbeth Renshaw about his recent CT scan findings, he feels his CT scan finding is not very typical for radiation pneumonitis. His SBRT was on hold, but he has since completed it. -He has started chemotherapy, cough much improved. -He did not tolerate FOLFIRI well, refused to continue, but agrees to continue irinotecan and Avastin. -Lab review, adequately to treatment, will proceed Cycle 5 irinotecan today. -continue Avastin every 2 weeks, on a different day. -Due to difficulty sleeping, Decadron will be reduced from 8 mg to 4 mg starting on cycle 5. -He would like to have chemo break after this cycle chemo for coming holidays, will repeat scan CT scan in 1-2 weeks  2. Dry cough -His initial cough is likely related to his SBRT,he received a course of prednisone  -I'm concerned he is dry cough to be related to his cancer progression in the lung  -Much improved since he started chemotherapy  3. Obesity  -I encouraged him to eat healthy and exercise   4. Chest tightness, back pain -he has had some shoulder and upper back tightness lately, he has been taking hydrocodone for that -EKG was unremarkable -This is likely muscular related, resolved lately  PLAN -CT scan will be done in 1-2 weeks with contrast. He is to call us if he is not notified of the scheduled date. -Lab, flush and chemo irinotecan on 07/12/15. -Decadron has been reduced from 8 to 4 mg.  I spent 20 minutes counseling the patient face to face. The total time  spent in the appointment was 25 minutes and more than 50% was on counseling.     Truitt Merle, MD 06/20/2016   This document serves as a record of services personally performed by Truitt Merle, MD. It was created on her behalf by Darcus Austin, a trained medical scribe. The  creation of this record is based on the scribe's personal observations and the provider's statements to them. This document has been checked and approved by the attending provider.

## 2016-06-20 ENCOUNTER — Encounter: Payer: Self-pay | Admitting: Hematology

## 2016-06-20 ENCOUNTER — Ambulatory Visit: Payer: BLUE CROSS/BLUE SHIELD

## 2016-06-20 ENCOUNTER — Telehealth: Payer: Self-pay | Admitting: *Deleted

## 2016-06-20 ENCOUNTER — Ambulatory Visit (HOSPITAL_BASED_OUTPATIENT_CLINIC_OR_DEPARTMENT_OTHER): Payer: BLUE CROSS/BLUE SHIELD

## 2016-06-20 ENCOUNTER — Telehealth: Payer: Self-pay | Admitting: Hematology

## 2016-06-20 ENCOUNTER — Other Ambulatory Visit (HOSPITAL_BASED_OUTPATIENT_CLINIC_OR_DEPARTMENT_OTHER): Payer: BLUE CROSS/BLUE SHIELD

## 2016-06-20 ENCOUNTER — Ambulatory Visit (HOSPITAL_BASED_OUTPATIENT_CLINIC_OR_DEPARTMENT_OTHER): Payer: BLUE CROSS/BLUE SHIELD | Admitting: Hematology

## 2016-06-20 VITALS — BP 132/78 | HR 101 | Temp 98.6°F | Resp 18 | Ht 66.0 in | Wt 217.0 lb

## 2016-06-20 DIAGNOSIS — C189 Malignant neoplasm of colon, unspecified: Secondary | ICD-10-CM

## 2016-06-20 DIAGNOSIS — C7802 Secondary malignant neoplasm of left lung: Secondary | ICD-10-CM

## 2016-06-20 DIAGNOSIS — C772 Secondary and unspecified malignant neoplasm of intra-abdominal lymph nodes: Secondary | ICD-10-CM | POA: Diagnosis not present

## 2016-06-20 DIAGNOSIS — C7800 Secondary malignant neoplasm of unspecified lung: Principal | ICD-10-CM

## 2016-06-20 DIAGNOSIS — C7801 Secondary malignant neoplasm of right lung: Secondary | ICD-10-CM | POA: Diagnosis not present

## 2016-06-20 DIAGNOSIS — M549 Dorsalgia, unspecified: Secondary | ICD-10-CM

## 2016-06-20 DIAGNOSIS — C187 Malignant neoplasm of sigmoid colon: Secondary | ICD-10-CM

## 2016-06-20 DIAGNOSIS — R05 Cough: Secondary | ICD-10-CM

## 2016-06-20 DIAGNOSIS — Z5111 Encounter for antineoplastic chemotherapy: Secondary | ICD-10-CM

## 2016-06-20 DIAGNOSIS — Z95828 Presence of other vascular implants and grafts: Secondary | ICD-10-CM

## 2016-06-20 DIAGNOSIS — R0789 Other chest pain: Secondary | ICD-10-CM

## 2016-06-20 DIAGNOSIS — E669 Obesity, unspecified: Secondary | ICD-10-CM

## 2016-06-20 LAB — COMPREHENSIVE METABOLIC PANEL
ALBUMIN: 3.5 g/dL (ref 3.5–5.0)
ALT: 35 U/L (ref 0–55)
ANION GAP: 10 meq/L (ref 3–11)
AST: 22 U/L (ref 5–34)
Alkaline Phosphatase: 105 U/L (ref 40–150)
BILIRUBIN TOTAL: 0.42 mg/dL (ref 0.20–1.20)
BUN: 11 mg/dL (ref 7.0–26.0)
CALCIUM: 9.5 mg/dL (ref 8.4–10.4)
CO2: 26 mEq/L (ref 22–29)
CREATININE: 0.9 mg/dL (ref 0.7–1.3)
Chloride: 105 mEq/L (ref 98–109)
EGFR: >90 mL/min/{1.73_m2} (ref >90)
Glucose: 117 mg/dl (ref 70–140)
Potassium: 4.1 mEq/L (ref 3.5–5.1)
Sodium: 141 mEq/L (ref 136–145)
TOTAL PROTEIN: 7.1 g/dL (ref 6.4–8.3)

## 2016-06-20 LAB — CBC WITH DIFFERENTIAL/PLATELET
BASO%: 0.6 % (ref 0.0–2.0)
BASOS ABS: 0 10*3/uL (ref 0.0–0.1)
EOS ABS: 0.1 10*3/uL (ref 0.0–0.5)
EOS%: 1.8 % (ref 0.0–7.0)
HCT: 37.6 % — ABNORMAL LOW (ref 38.4–49.9)
HEMOGLOBIN: 12.6 g/dL — AB (ref 13.0–17.1)
LYMPH%: 14.4 % (ref 14.0–49.0)
MCH: 29.3 pg (ref 27.2–33.4)
MCHC: 33.5 g/dL (ref 32.0–36.0)
MCV: 87.4 fL (ref 79.3–98.0)
MONO#: 0.8 10*3/uL (ref 0.1–0.9)
MONO%: 11.4 % (ref 0.0–14.0)
NEUT#: 4.8 10*3/uL (ref 1.5–6.5)
NEUT%: 71.8 % (ref 39.0–75.0)
NRBC: 0 % (ref 0–0)
PLATELETS: 211 10*3/uL (ref 140–400)
RBC: 4.3 10*6/uL (ref 4.20–5.82)
RDW: 14.4 % (ref 11.0–14.6)
WBC: 6.7 10*3/uL (ref 4.0–10.3)
lymph#: 1 10*3/uL (ref 0.9–3.3)

## 2016-06-20 LAB — CEA (IN HOUSE-CHCC): CEA (CHCC-In House): 1.05 ng/mL (ref 0.00–5.00)

## 2016-06-20 MED ORDER — IRINOTECAN HCL CHEMO INJECTION 100 MG/5ML
160.0000 mg/m2 | Freq: Once | INTRAVENOUS | Status: AC
  Administered 2016-06-20: 340 mg via INTRAVENOUS
  Filled 2016-06-20: qty 15

## 2016-06-20 MED ORDER — SODIUM CHLORIDE 0.9 % IV SOLN
Freq: Once | INTRAVENOUS | Status: AC
  Administered 2016-06-20: 10:00:00 via INTRAVENOUS

## 2016-06-20 MED ORDER — ATROPINE SULFATE 1 MG/ML IJ SOLN
0.5000 mg | Freq: Once | INTRAMUSCULAR | Status: AC | PRN
  Administered 2016-06-20: 0.5 mg via INTRAVENOUS

## 2016-06-20 MED ORDER — PALONOSETRON HCL INJECTION 0.25 MG/5ML
0.2500 mg | Freq: Once | INTRAVENOUS | Status: AC
  Administered 2016-06-20: 0.25 mg via INTRAVENOUS

## 2016-06-20 MED ORDER — SODIUM CHLORIDE 0.9% FLUSH
10.0000 mL | INTRAVENOUS | Status: DC | PRN
  Administered 2016-06-20: 10 mL
  Filled 2016-06-20: qty 10

## 2016-06-20 MED ORDER — PALONOSETRON HCL INJECTION 0.25 MG/5ML
INTRAVENOUS | Status: AC
  Filled 2016-06-20: qty 5

## 2016-06-20 MED ORDER — HEPARIN SOD (PORK) LOCK FLUSH 100 UNIT/ML IV SOLN
500.0000 [IU] | Freq: Once | INTRAVENOUS | Status: AC | PRN
  Administered 2016-06-20: 500 [IU]
  Filled 2016-06-20: qty 5

## 2016-06-20 MED ORDER — DEXAMETHASONE SODIUM PHOSPHATE 10 MG/ML IJ SOLN
INTRAMUSCULAR | Status: AC
  Filled 2016-06-20: qty 1

## 2016-06-20 MED ORDER — DEXAMETHASONE SODIUM PHOSPHATE 10 MG/ML IJ SOLN
4.0000 mg | Freq: Once | INTRAMUSCULAR | Status: AC
  Administered 2016-06-20: 4 mg via INTRAVENOUS

## 2016-06-20 MED ORDER — ATROPINE SULFATE 1 MG/ML IJ SOLN
INTRAMUSCULAR | Status: AC
Start: 2016-06-20 — End: 2016-06-20
  Filled 2016-06-20: qty 1

## 2016-06-20 MED ORDER — SODIUM CHLORIDE 0.9% FLUSH
10.0000 mL | INTRAVENOUS | Status: DC | PRN
  Administered 2016-06-20: 10 mL via INTRAVENOUS
  Filled 2016-06-20: qty 10

## 2016-06-20 NOTE — Telephone Encounter (Signed)
Per LOS I have scheduled appts and notified the scheduler 

## 2016-06-20 NOTE — Addendum Note (Signed)
Addended by: Truitt Merle on: 06/20/2016 10:32 AM   Modules accepted: Orders

## 2016-06-20 NOTE — Patient Instructions (Signed)
[place] Discharge Instructions for Patients Receiving Chemotherapy  Today you received the following chemotherapy agent: Camptosar.  To help prevent nausea and vomiting after your treatment, we encourage you to take your nausea medication.   If you develop nausea and vomiting that is not controlled by your nausea medication, call the clinic.   BELOW ARE SYMPTOMS THAT SHOULD BE REPORTED IMMEDIATELY:  *FEVER GREATER THAN 100.5 F  *CHILLS WITH OR WITHOUT FEVER  NAUSEA AND VOMITING THAT IS NOT CONTROLLED WITH YOUR NAUSEA MEDICATION  *UNUSUAL SHORTNESS OF BREATH  *UNUSUAL BRUISING OR BLEEDING  TENDERNESS IN MOUTH AND THROAT WITH OR WITHOUT PRESENCE OF ULCERS  *URINARY PROBLEMS  *BOWEL PROBLEMS  UNUSUAL RASH Items with * indicate a potential emergency and should be followed up as soon as possible.  Feel free to call the clinic you have any questions or concerns. The clinic phone number is [phone].  Please show the [place] at check-in to the Emergency Department and triage nurse.

## 2016-06-20 NOTE — Telephone Encounter (Signed)
Message sent to chemo scheduler to be added per 06/20/16 los. 2 bottles of contrast was given to the patient, per CT ordered. Appointments scheduled per 06/20/16 los. A copy of the appointment schedule and AVS report, was given to th e patient per 06/20/16 los.

## 2016-06-27 ENCOUNTER — Encounter (HOSPITAL_COMMUNITY): Payer: Self-pay

## 2016-06-27 ENCOUNTER — Other Ambulatory Visit: Payer: BLUE CROSS/BLUE SHIELD

## 2016-06-27 ENCOUNTER — Ambulatory Visit (HOSPITAL_COMMUNITY)
Admission: RE | Admit: 2016-06-27 | Discharge: 2016-06-27 | Disposition: A | Discharge status: Home / Self Care | Payer: BLUE CROSS/BLUE SHIELD | Source: Ambulatory Visit | Attending: Hematology | Admitting: Hematology

## 2016-06-27 DIAGNOSIS — C7802 Secondary malignant neoplasm of left lung: Secondary | ICD-10-CM | POA: Insufficient documentation

## 2016-06-27 DIAGNOSIS — C7801 Secondary malignant neoplasm of right lung: Secondary | ICD-10-CM | POA: Insufficient documentation

## 2016-06-27 DIAGNOSIS — C189 Malignant neoplasm of colon, unspecified: Secondary | ICD-10-CM | POA: Insufficient documentation

## 2016-06-27 DIAGNOSIS — Y842 Radiological procedure and radiotherapy as the cause of abnormal reaction of the patient, or of later complication, without mention of misadventure at the time of the procedure: Secondary | ICD-10-CM | POA: Diagnosis not present

## 2016-06-27 DIAGNOSIS — C7800 Secondary malignant neoplasm of unspecified lung: Secondary | ICD-10-CM

## 2016-06-27 MED ORDER — IOPAMIDOL (ISOVUE-300) INJECTION 61%
INTRAVENOUS | Status: AC
  Filled 2016-06-27: qty 100

## 2016-06-27 MED ORDER — IOPAMIDOL (ISOVUE-300) INJECTION 61%
100.0000 mL | Freq: Once | INTRAVENOUS | Status: AC | PRN
  Administered 2016-06-27: 100 mL via INTRAVENOUS

## 2016-06-28 ENCOUNTER — Ambulatory Visit: Payer: BLUE CROSS/BLUE SHIELD | Admitting: Hematology

## 2016-07-11 ENCOUNTER — Telehealth: Payer: Self-pay | Admitting: Hematology

## 2016-07-11 ENCOUNTER — Encounter: Payer: Self-pay | Admitting: Hematology

## 2016-07-11 ENCOUNTER — Ambulatory Visit: Payer: BLUE CROSS/BLUE SHIELD

## 2016-07-11 ENCOUNTER — Other Ambulatory Visit (HOSPITAL_BASED_OUTPATIENT_CLINIC_OR_DEPARTMENT_OTHER): Payer: BLUE CROSS/BLUE SHIELD

## 2016-07-11 ENCOUNTER — Telehealth: Payer: Self-pay | Admitting: *Deleted

## 2016-07-11 ENCOUNTER — Ambulatory Visit (HOSPITAL_BASED_OUTPATIENT_CLINIC_OR_DEPARTMENT_OTHER): Payer: BLUE CROSS/BLUE SHIELD

## 2016-07-11 ENCOUNTER — Ambulatory Visit (HOSPITAL_BASED_OUTPATIENT_CLINIC_OR_DEPARTMENT_OTHER): Payer: BLUE CROSS/BLUE SHIELD | Admitting: Hematology

## 2016-07-11 VITALS — BP 138/89 | HR 102 | Temp 98.5°F | Resp 18 | Wt 217.5 lb

## 2016-07-11 DIAGNOSIS — C7800 Secondary malignant neoplasm of unspecified lung: Principal | ICD-10-CM

## 2016-07-11 DIAGNOSIS — C187 Malignant neoplasm of sigmoid colon: Secondary | ICD-10-CM

## 2016-07-11 DIAGNOSIS — Z7189 Other specified counseling: Secondary | ICD-10-CM | POA: Insufficient documentation

## 2016-07-11 DIAGNOSIS — C189 Malignant neoplasm of colon, unspecified: Secondary | ICD-10-CM

## 2016-07-11 DIAGNOSIS — Z5111 Encounter for antineoplastic chemotherapy: Secondary | ICD-10-CM

## 2016-07-11 DIAGNOSIS — C7801 Secondary malignant neoplasm of right lung: Secondary | ICD-10-CM | POA: Diagnosis not present

## 2016-07-11 DIAGNOSIS — R05 Cough: Secondary | ICD-10-CM

## 2016-07-11 DIAGNOSIS — R079 Chest pain, unspecified: Secondary | ICD-10-CM

## 2016-07-11 DIAGNOSIS — E669 Obesity, unspecified: Secondary | ICD-10-CM

## 2016-07-11 DIAGNOSIS — C7802 Secondary malignant neoplasm of left lung: Secondary | ICD-10-CM

## 2016-07-11 DIAGNOSIS — Z95828 Presence of other vascular implants and grafts: Secondary | ICD-10-CM

## 2016-07-11 LAB — COMPREHENSIVE METABOLIC PANEL
ALBUMIN: 3.9 g/dL (ref 3.5–5.0)
ALK PHOS: 103 U/L (ref 40–150)
ALT: 35 U/L (ref 0–55)
AST: 21 U/L (ref 5–34)
Anion Gap: 10 mEq/L (ref 3–11)
BILIRUBIN TOTAL: 0.36 mg/dL (ref 0.20–1.20)
BUN: 14.5 mg/dL (ref 7.0–26.0)
CO2: 25 mEq/L (ref 22–29)
CREATININE: 0.9 mg/dL (ref 0.7–1.3)
Calcium: 9.7 mg/dL (ref 8.4–10.4)
Chloride: 105 mEq/L (ref 98–109)
EGFR: >90 mL/min/{1.73_m2} (ref >90)
Glucose: 124 mg/dl (ref 70–140)
POTASSIUM: 4.4 meq/L (ref 3.5–5.1)
Sodium: 140 mEq/L (ref 136–145)
Total Protein: 7.5 g/dL (ref 6.4–8.3)

## 2016-07-11 LAB — CBC WITH DIFFERENTIAL/PLATELET
BASO%: 0.4 % (ref 0.0–2.0)
BASOS ABS: 0 10*3/uL (ref 0.0–0.1)
EOS%: 1.8 % (ref 0.0–7.0)
Eosinophils Absolute: 0.1 10*3/uL (ref 0.0–0.5)
HEMATOCRIT: 40.1 % (ref 38.4–49.9)
HEMOGLOBIN: 13.3 g/dL (ref 13.0–17.1)
LYMPH#: 1.1 10*3/uL (ref 0.9–3.3)
LYMPH%: 19.6 % (ref 14.0–49.0)
MCH: 29 pg (ref 27.2–33.4)
MCHC: 33.2 g/dL (ref 32.0–36.0)
MCV: 87.6 fL (ref 79.3–98.0)
MONO#: 0.8 10*3/uL (ref 0.1–0.9)
MONO%: 14.3 % — ABNORMAL HIGH (ref 0.0–14.0)
NEUT#: 3.5 10*3/uL (ref 1.5–6.5)
NEUT%: 63.9 % (ref 39.0–75.0)
Platelets: 251 10*3/uL (ref 140–400)
RBC: 4.58 10*6/uL (ref 4.20–5.82)
RDW: 14.4 % (ref 11.0–14.6)
WBC: 5.5 10*3/uL (ref 4.0–10.3)
nRBC: 0 % (ref 0–0)

## 2016-07-11 MED ORDER — IRINOTECAN HCL CHEMO INJECTION 100 MG/5ML
160.0000 mg/m2 | Freq: Once | INTRAVENOUS | Status: AC
  Administered 2016-07-11: 340 mg via INTRAVENOUS
  Filled 2016-07-11: qty 15

## 2016-07-11 MED ORDER — HYDROCODONE-ACETAMINOPHEN 5-325 MG PO TABS: 1.0000 | ORAL_TABLET | Freq: Four times a day (QID) | ORAL | 0 refills | Status: DC | PRN

## 2016-07-11 MED ORDER — SODIUM CHLORIDE 0.9% FLUSH
10.0000 mL | INTRAVENOUS | Status: DC | PRN
  Administered 2016-07-11: 10 mL via INTRAVENOUS
  Filled 2016-07-11: qty 10

## 2016-07-11 MED ORDER — SODIUM CHLORIDE 0.9 % IV SOLN
Freq: Once | INTRAVENOUS | Status: AC
  Administered 2016-07-11: 11:00:00 via INTRAVENOUS

## 2016-07-11 MED ORDER — SODIUM CHLORIDE 0.9% FLUSH
10.0000 mL | INTRAVENOUS | Status: DC | PRN
  Filled 2016-07-11: qty 10

## 2016-07-11 MED ORDER — PALONOSETRON HCL INJECTION 0.25 MG/5ML
INTRAVENOUS | Status: AC
  Filled 2016-07-11: qty 5

## 2016-07-11 MED ORDER — PALONOSETRON HCL INJECTION 0.25 MG/5ML
0.2500 mg | Freq: Once | INTRAVENOUS | Status: AC
  Administered 2016-07-11: 0.25 mg via INTRAVENOUS

## 2016-07-11 MED ORDER — HEPARIN SOD (PORK) LOCK FLUSH 100 UNIT/ML IV SOLN
500.0000 [IU] | Freq: Once | INTRAVENOUS | Status: AC | PRN
  Administered 2016-07-11: 500 [IU]
  Filled 2016-07-11: qty 5

## 2016-07-11 NOTE — Telephone Encounter (Signed)
Appointments scheduled per 1/4 LOS. Patient given AVS report and calendars with future scheduled appointments. °

## 2016-07-11 NOTE — Progress Notes (Signed)
Bill Wright  [phone] [phone]  Clinic Follow Up Note    07/11/2016  CHIEF COMPLAINTS:  Follow Up colon cancer  Oncology History   T2, Colon cancer metastasized to lung [facility])   Staging form: Colon and Rectum, AJCC 7th Edition     Clinical stage from 03/30/2015: Stage Unknown ([place], N1, M1) - Unsigned       Colon cancer metastasized to lung [facility]) s/p laparoscopic assisted sigmoid colectomy 11/02/15   03/30/2015 Miscellaneous    Foundation one genomic testing showed TP53 mutation, MSI stable, low tumor mutation burden. Negative for K-ras, NRAS and BRAF      03/30/2015 Initial Biopsy    Sigmoid mass biopsy showed invasive adenocarcinoma. Cecal colon polyps showed tubular adenoma.      03/30/2015 Initial Diagnosis    Colon cancer      03/30/2015 Procedure    colonoscopy by Dr. Michail Sermon showed a fungating, infiltrative and ulcerated nonobstructing large mass in the sigmoid colon and at 20 cm proximal to the anus. The mass was partially circumferential no bleeding. A 10 mm polyps in the cecum was removed.      04/03/2015 Imaging    CT chest, abdomen and pelvis with contrast showed nodular masslike area of clinical worsening at rectosigmoid junction, tiny pericolonic lymph nodes, bilateral pulmonary nodules measuring about 1 cm.      04/12/2015 PET scan    Hypermetabolic colonic mass near rectosigmoid junction, tiny subcentimeter paracolonic lymph nodes. Bilateral pulmonary metastasis.      04/19/2015 Procedure    CT-guided lung nodule biopsy attempted, unsuccessful.      04/27/2015 - 09/14/2015 Chemotherapy    Oxaliplatin 130 mg/m on day 1, Capecitabine 2361m q12hr, 2 weeks on and one week off (only received 7 days for first cycle), oxaliplatin held on cycle 5 and dose reduced to 1063mm2, capecitabine reduced to 200043m12h D1-14, stopped per pt's request.       06/29/2015 - 10/19/2015 Chemotherapy     Panitumumab every 2 weeks, some  cycles were postponed due to pt's request, stoppe per pt's request       09/18/2015 Imaging    Pulmonary nodules are less hypermetabolic, stable size. Stable hypermetabolic portal hepatis and abdominal peritoneal ligament lymph nodes. No other new lesions.       11/02/2015 Surgery    sigmoid colon segmental resection       11/02/2015 Pathology Results    Sigmoid colon segmental resection showed adenocarcinoma, grade 3,  T2, 3 out of 13 lymph nodes were positive, surgical margins were negative. LVI(-), perineural invasion negative      11/13/2015 Imaging    CT chest, abdomen and pelvis with contrast showed postsurgical changes, mild progression of pulmonary metastasis, measuring up to 15 mm in the right lower lobe.      11/29/2015 - 12/05/2015 Radiation Therapy    SBRT to 4 lung lesions in 3 sessions       04/25/2016 -  Chemotherapy    FOLFIRI every 2 weeks, and Avastin added from cycle 3, 5-FU held since cycle 2 due to poor tolerance        06/27/2016 Imaging    CT CAP 06/27/16 IMPRESSION: Status post partial left hemicolectomy. Progressive wall thickening involving the colon near the suture line, worrisome for residual tumor. Adjacent 7 mm short axis pericolonic lymph node, possibly reflecting a nodal metastasis. Radiation changes in the posterior right upper lobe and superior segment right lower lobe. Progressive pulmonary metastases bilaterally, measuring up  to 13 mm, as above.       HISTORY OF PRESENTING ILLNESS:  Bill Wright 56 y.o. male is here because of recently newly diagnosed colon cancer.  He has had intermittent bloody stool for 2 years, it has been mild, mixed with stool, patient does not have any abdominal pain, constipation, change of his bowel habits, nausea, weight loss or other symptoms. He did not seek medical attention for this. He went to emergency room on 03/15/2015 for right flank pain, due to his kidney stone. CT scan incidentally found a 11 mm right  lower lobe nodule and mucosal edema in the sigmoid colon. He saw his primary care physician, and was referred to GI Dr. Michail Sermon here at he underwent colonoscopy on 03/30/2015, which showed a fungating infiltrative and ulcerated nonobstructing large mass in the sigmoid colon, biopsy showed adenocarcinoma. CT chest abdomen and pelvis showed multiple lung nodules measuring about 1 cm. He was referred to surgeon Dr. Zella Richer, who referred patient to [place] for further workup of his lung nodule and discuss chemotherapy.  He feels very well overall, denies any symptoms. 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 He never had screening colonoscopy prior the reason one, no significant past medical history, does not see doctors regularly.  CURRENT TREATMENT: chemotherapy Irinotecan and Avastin, started on 04/25/2016, he tried FOLFIRI for cycle 2 but tolerated poorly   INTERIM HISTORY Bill Wright returns for follow-up and chemo. His cough has much improved, 90% better since he started chemotherapy. He developed tender spot in the right from low chest, when he coughs. He takes hydrocodone as needed for that. He otherwise doing well. He is quite stressed from his work, due to the work load and documented. He plans to take 3 months off work by [employer].    MEDICAL HISTORY:  Past Medical History:  Diagnosis Date  . Anxiety    situational due to cancer diagnosis  . Cancer [facility]) 2017   colon-chemo 09/22/15 now surgery  . Hypercholesteremia   . Kidney calculi     SURGICAL HISTORY: Past Surgical History:  Procedure Laterality Date  . COLONOSCOPY  03/30/15  . LAPAROSCOPIC PARTIAL COLECTOMY N/A 11/02/2015   Procedure: LAPAROSCOPIC ASSISTED SIGMOID COLECTOMY;  Surgeon: Jackolyn Confer, MD;  Location: [facility]S;  Service: General;  Laterality: N/A;  . PORTACATH PLACEMENT N/A 05/08/2016   Procedure: INSERTION PORT-A-CATH;  Surgeon: Jackolyn Confer, MD;  Location: [facility];  Service: General;   Laterality: N/A;    SOCIAL HISTORY: Social History   Social History  . Marital Status: Married    Spouse Name: N/A  . Number of Children: 3, age of 69, 36 and 80   . Years of Education: N/A   Occupational History  . Banker for [employer] [place]    Social History Main Topics  . Smoking status: Never Smoker   . Smokeless tobacco: Not on file  . Alcohol Use: No  . Drug Use: No  . Sexual Activity: Not on file   Other Topics Concern  . Not on file   Social History Narrative    FAMILY HISTORY: Family History  Problem Relation Age of Onset  . Breast cancer Mother 37    +rad and lymph node  . Diabetes Maternal Grandmother     leading to blindness  . Obesity Maternal Aunt   . Stroke Maternal Uncle 65    ALLERGIES:  has No Known Allergies.  MEDICATIONS:  Current Outpatient Prescriptions  Medication Sig Dispense Refill  . ALPRAZolam (XANAX) 0.25 MG tablet Take 1 tablet (0.25 mg total) by mouth 3 (three) times daily as needed for anxiety. 30 tablet 0  . clindamycin (CLINDAGEL) 1 % gel Apply topically 2 (two) times daily. 30 g 5  . cyclobenzaprine (FLEXERIL) 5 MG tablet Take 1 tablet (5 mg total) by mouth 3 (three) times daily as needed for muscle spasms. 30 tablet 0  . HYDROcodone-acetaminophen (NORCO/VICODIN) 5-325 MG tablet Take 1-2 tablets by mouth every 6 (six) hours as needed for moderate pain or severe pain. 20 tablet 0  . hydrocortisone 2.5 % lotion APPLY EXTERNALLY TO THE AFFECTED AREA TWICE DAILY 200 mL 0  . HYDROMET 5-1.5 MG/5ML syrup TK 5 MLS PO Q 6 H PRF COUGH  0  . ibuprofen (ADVIL,MOTRIN) 200 MG tablet Take 600 mg by mouth every 6 (six) hours as needed.    . lidocaine-prilocaine (EMLA) cream Apply 1 application topically as needed. 30 g 1  . ondansetron (ZOFRAN) 8 MG tablet Take 1 tablet (8 mg total) by mouth 2 (two) times daily as needed for refractory nausea / vomiting. Start on day 3 after chemotherapy. 30 tablet 1  . prochlorperazine (COMPAZINE) 10 MG tablet  Take 1 tablet (10 mg total) by mouth every 6 (six) hours as needed (NAUSEA). 30 tablet 2   No current facility-administered medications for this visit.     REVIEW OF SYSTEMS: Constitutional: Denies fevers, chills or abnormal night sweats, no weight loss. Eyes: Denies blurriness of vision, double vision or watery eyes Ears, nose, mouth, throat, and face: Denies mucositis or sore throat Respiratory: Denies cough, dyspnea or wheezes Cardiovascular: Denies palpitation, chest discomfort or lower extremity swelling Gastrointestinal:  Denies nausea, heartburn or change in bowel habits Skin: Denies abnormal skin rashes Lymphatics: Denies new lymphadenopathy or easy bruising Neurological:Denies numbness, tingling or new weaknesses Behavioral/Psych: Mood is stable, no new changes  All other systems were reviewed with the patient and are negative.  PHYSICAL EXAMINATION: ECOG PERFORMANCE STATUS: 1 BP 138/89 (BP Location: Left Arm, Patient Position: Sitting)   Pulse (!) 102   Temp 98.5 F (36.9 C) (Oral)   Resp 18   Wt 217 lb 8 oz (98.7 kg)   SpO2 97%   BMI 35.11 kg/m  GENERAL:alert, no distress and comfortable SKIN: skin color, texture, turgor are normal. No rash noted on the face. EYES: normal, conjunctiva are pink and non-injected, sclera clear OROPHARYNX:no exudate, no erythema and lips, buccal mucosa, and tongue normal  NECK: supple, thyroid normal size, non-tender, without nodularity LYMPH:  no palpable lymphadenopathy in the cervical, axillary or inguinal LUNGS: clear to auscultation and percussion with normal breathing effort HEART: regular rate & rhythm and no murmurs and no lower extremity edema ABDOMEN:abdomen soft, non-tender and normal bowel sounds. The midline incision has healed well, no discharge or skin erythema  Musculoskeletal:no cyanosis of digits and no clubbing  PSYCH: alert & oriented x 3 with fluent speech NEURO: no focal motor/sensory deficits  LABORATORY DATA:    I have reviewed the data as listed CBC Latest Ref Rng & Units 07/11/2016 06/20/2016 06/06/2016  WBC 4.0 - 10.3 10e3/uL 5.5 6.7 5.7  Hemoglobin 13.0 - 17.1 g/dL 13.3 12.6(L) 12.4(L)  Hematocrit 38.4 - 49.9 % 40.1 37.6(L) 37.2(L)  Platelets 140 - 400 10e3/uL 251 211 201   CMP Latest Ref Rng & Units 07/11/2016 06/20/2016 06/06/2016  Glucose 70 - 140 mg/dl 124 117 121  BUN 7.0 - 26.0 mg/dL 14.5 11.0 10.1  Creatinine 0.7 - 1.3 mg/dL 0.9 0.9 0.8  Sodium 136 - 145 mEq/L 140 141 141  Potassium 3.5 - 5.1 mEq/L 4.4 4.1 4.2  Chloride 101 - 111 mmol/L - - -  CO2 22 - 29 mEq/L _0 Calcium 8.4 - 10.4 mg/dL 9.7 9.5 9.7  Total Protein 6.4 - 8.3 g/dL 7.5 7.1 6.9  Total Bilirubin 0.20 - 1.20 mg/dL 0.36 0.42 0.43  Alkaline Phos 40 - 150 U/L 103 105 96  AST 5 - 34 U/L _1 ALT 0 - 55 U/L 35 35 33   CEA:  04/20/2016: <0.5 06/08/2015: <0.5 09/07/2015: <0.5 11/15/2015: 0.9 02/16/2016: <1.0  04/25/2016: 1.6 05/23/16: <1.00  PATHOLOGY REPORT  Diagnosis 11/02/2015 1. Colon, segmental resection for tumor, sigmoid ADENOCARCINOMA OF THE SIGMOID COLON (2.0 CM), GRADE 3 THE TUMOR INVADES MUSCULARIS PROPRIA MARGINS OF RESECTION ARE NEGATIVE METASTATIC ADENOCARCINOMA IN THREE OF THIRTEEN LYMPH NODES (3/13) 2. Colon, resection margin (donut), distal sigmoid BENIGN COLONIC TISSUE Microscopic Comment 1. COLON AND RECTUM (INCLUDING TRANS-ANAL RESECTION): Specimen: Sigmoid Procedure: Segmental resection Tumor site: sigmoid Specimen integrity: Intact Macroscopic intactness of mesorectum: Not applicable: x Complete: NA Near complete: NA Incomplete: NA Cannot be determined (specify): NA Macroscopic tumor perforation: Muscularis Invasive tumor: Maximum size: 2.0 cm Histologic type(s): Adenocarcinoma Histologic grade and differentiation: G3 G1: well differentiated/low grade G2: moderately differentiated/low grade G3: poorly differentiated/high grade G4: undifferentiated/high grade Type of polyp  in which invasive carcinoma arose: Tubular adenoma Microscopic extension of invasive tumor: Muscularis propria Lymph-Vascular invasion: Negative Peri-neural invasion: Negative Tumor deposit(s) (discontinuous extramural extension): Negative Resection margins: Proximal margin: Negative Distal margin: Negative Circumferential (radial) (posterior ascending, posterior descending; lateral and posterior mid-rectum; and entire lower 1/3 rectum):Negative Mesenteric margin (sigmoid and transverse): Negative Distance closest margin (if all above margins negative): 3.5 cm Trans-anal resection margins only: Deep margin: NA Mucosal Margin: NA Distance closest mucosal margin (if negative): NA Treatment effect (neo-adjuvant therapy): Partial Additional polyp(s): None Non-neoplastic findings: unremarkable Lymph nodes: number examined 13; number positive: 3 Pathologic Staging: T2, N1b, M1a     RADIOGRAPHIC STUDIES: I have personally reviewed the radiological images as listed and agreed with the findings in the report.  PET 04/12/2015 IMPRESSION: Hypermetabolic colonic mass near rectosigmoid junction, consistent with known primary colon carcinoma.  Tiny sub-cm pericolonic lymph nodes in sigmoid mesocolon are too small to characterize by PET, but are suspicious for early lymph node metastases.  Bilateral pulmonary metastases  PET 09/18/2015 IMPRESSION: 1. Hypermetabolic pulmonary metastases have enlarged slightly from 07/18/2015. 2. Hypermetabolic porta hepatis/abdominal peritoneal ligament lymph nodes, stable from 04/12/2015. 3. Increase in hypermetabolism associated with mesenteric haziness and nodularity. While a reactive phenomenon/panniculitis can create this in appearance, metastatic disease/lymphoproliferative disorder cannot be excluded. 4. Hepatic steatosis. 5. Bilateral nephrolithiasis.  CT chest, abdomen and pelvis with contrast 06/27/2016 IMPRESSION: Status post partial  left hemicolectomy. Progressive wall thickening involving the colon near the suture line, worrisome for residual tumor.  Adjacent 7 mm short axis pericolonic lymph node, possibly reflecting a nodal metastasis.  Radiation changes in the posterior right upper lobe and superior segment right lower lobe.  Progressive pulmonary metastases bilaterally, measuring up to 13 mm, as above.   ASSESSMENT & PLAN:  56 y.o. male, without significant past medical history except kidney stone, presented with intermittent bloody stool for 2 years, and colonoscopy showed a large sigmoid colon mass, CT scan showed multiple (at least 4) nodules in bilateral lungs, measuring about 1 cm.  1. Sigmoid colon adenocarcinoma, pT2N1bM1,  probably stage IV with lung mets, KRA/NRAS wild type, MSI-stable -I previously reviewed his colonoscopy, CT scan findings and the biopsy results in great details with patient and his wife. -I previously reviewed his surgical pathology findings, which showed a residual T2 primary tumor, 3 lymph nodes positive, grade 3 disease, certainly high risk disease.  -We discussed that his disease is likely incurable, and the goal of therapy is maximum disease control and prolong his life.  -- His tumor does not contain KRAS/NRAS or BRAF mutation, so he would benefit from EGFR antibody, Panitumumab was added on from cycle 4. He received a total of 4 month of chemotherapy which was held per patient's request due to the skin rashes.  --I previously personally reviewed his CT scan image from  04/05/2016 and compared with prior CT with pt in person, the previous radiated area in the superior segment right lower lobe has developed infiltrative change, I suspect this could be cancer progression, although radiation pneumonitis is still also possibility, but it would be unusual giving the timeframe after radiation 4- 5 months ago. He also has a few other small lung nodules has slightly increased in  size. -Clinically he had a persistent, slightly worse dry cough. He completed a course of prednisone, his cough was not typical from radiation pneumonitis. -I previously discussed with Dr. Lisbeth Renshaw about his recent CT scan findings, he feels his CT scan finding is not very typical for radiation pneumonitis.  -He has started chemotherapy, cough 90% improved. -He did not tolerate FOLFIRI well, refused to continue, but agrees to continue irinotecan and Avastin. -I personally reviewed his restaging CT scan from 06/27/2016, and compared to his previous CT scans. His infiltrative change in the right lung (could be malignant infiltration versus radiation pneumonitis) has much improved, responding to his clinical improvement on his cough. A few other known nodules has also slightly decreased in size, also one nodule in the infiltrative area seems more solid and slightly bigger. I disagree with the radiologist interpretation, I think dose clinically and radiographically he had good partial response to chemotherapy. -I recommend him to get a repeat her colonoscopy to ruled out for local recurrence, given the slightly worse wall thickening near the suture line, to ruled out metastasis. -I recommend him to switch Avastin back to thepanitumumab, he declined due to skin rash  -Lab review, adequately to treatment, will proceed Cycle 6 irinotecan today. -continue Avastin every 2 weeks, on a different day. -Due to difficulty sleeping and insomnia feeding chemotherapy, patient request to remove dexamethasone as a premedication. -We again had a long conversation about his long-term treatment plan. Patient wants to take a chemotherapy break, especially during the summer. I plan to repeat a CT scan in 3 months, and will give chemo break when his cancer is controlled   2. Dry cough -His initial cough is likely related to his SBRT,he received a course of prednisone  -I'm concerned he is dry cough to be related to his cancer  progression in the lung  -Much improved since he started chemotherapy  3. Obesity  -I encouraged him to eat healthy and exercise   4. Chest pain  -he has developed a tender spot in the right anterior low chest, related to cough -I reviewed his hydrocodone, he will use as needed  5. Goal of care discussion  -We again discussed the incurable nature of his cancer, although his cancer seems to be more indolent, and  -The patient understands the goal of care is  palliative. -I recommend DNR/DNI, she will think about it    PLAN -CT scan reviewed with patient, will continue chemotherapy irinotecan and Avastin, and continue every 2 weeks. Patient request Avastin to be given on differently, the same week as his chemotherapy. -We'll cancer dexamethasone before chemotherapy -Repeat a colonoscopy -I'll see him back in 4 weeks -We will fill out his FMLA paperwork  I spent 30 minutes counseling the patient face to face. The total time spent in the appointment was 40 minutes and more than 50% was on counseling.     Truitt Merle, MD 07/11/2016

## 2016-07-11 NOTE — Patient Instructions (Signed)
[facility] Discharge Instructions for Patients Receiving Chemotherapy  Today you received the following chemotherapy agent: Camptosar.  To help prevent nausea and vomiting after your treatment, we encourage you to take your nausea medication.   If you develop nausea and vomiting that is not controlled by your nausea medication, call the clinic.   BELOW ARE SYMPTOMS THAT SHOULD BE REPORTED IMMEDIATELY:  *FEVER GREATER THAN 100.5 F  *CHILLS WITH OR WITHOUT FEVER  NAUSEA AND VOMITING THAT IS NOT CONTROLLED WITH YOUR NAUSEA MEDICATION  *UNUSUAL SHORTNESS OF BREATH  *UNUSUAL BRUISING OR BLEEDING  TENDERNESS IN MOUTH AND THROAT WITH OR WITHOUT PRESENCE OF ULCERS  *URINARY PROBLEMS  *BOWEL PROBLEMS  UNUSUAL RASH Items with * indicate a potential emergency and should be followed up as soon as possible.  Feel free to call the clinic you have any questions or concerns. The clinic phone number is [phone].  Please show the [place] at check-in to the Emergency Department and triage nurse.

## 2016-07-11 NOTE — Telephone Encounter (Signed)
Per 1/4 LOS I have scheduled appts and gave calendar

## 2016-07-22 ENCOUNTER — Ambulatory Visit (HOSPITAL_BASED_OUTPATIENT_CLINIC_OR_DEPARTMENT_OTHER): Payer: BLUE CROSS/BLUE SHIELD

## 2016-07-22 ENCOUNTER — Other Ambulatory Visit (HOSPITAL_BASED_OUTPATIENT_CLINIC_OR_DEPARTMENT_OTHER): Payer: BLUE CROSS/BLUE SHIELD

## 2016-07-22 VITALS — BP 149/94 | HR 78 | Temp 98.9°F | Resp 17

## 2016-07-22 DIAGNOSIS — C7802 Secondary malignant neoplasm of left lung: Secondary | ICD-10-CM | POA: Diagnosis not present

## 2016-07-22 DIAGNOSIS — Z5112 Encounter for antineoplastic immunotherapy: Secondary | ICD-10-CM | POA: Diagnosis not present

## 2016-07-22 DIAGNOSIS — C187 Malignant neoplasm of sigmoid colon: Secondary | ICD-10-CM

## 2016-07-22 DIAGNOSIS — C7801 Secondary malignant neoplasm of right lung: Secondary | ICD-10-CM

## 2016-07-22 DIAGNOSIS — C189 Malignant neoplasm of colon, unspecified: Secondary | ICD-10-CM

## 2016-07-22 DIAGNOSIS — C7800 Secondary malignant neoplasm of unspecified lung: Secondary | ICD-10-CM

## 2016-07-22 LAB — COMPREHENSIVE METABOLIC PANEL
ALT: 47 U/L (ref 0–55)
ANION GAP: 11 meq/L (ref 3–11)
AST: 28 U/L (ref 5–34)
Albumin: 3.7 g/dL (ref 3.5–5.0)
Alkaline Phosphatase: 92 U/L (ref 40–150)
BUN: 10.9 mg/dL (ref 7.0–26.0)
CHLORIDE: 106 meq/L (ref 98–109)
CO2: 25 mEq/L (ref 22–29)
CREATININE: 0.8 mg/dL (ref 0.7–1.3)
Calcium: 9.4 mg/dL (ref 8.4–10.4)
EGFR: >90 mL/min/{1.73_m2} (ref >90)
Glucose: 120 mg/dl (ref 70–140)
POTASSIUM: 4.2 meq/L (ref 3.5–5.1)
Sodium: 142 mEq/L (ref 136–145)
Total Bilirubin: 0.35 mg/dL (ref 0.20–1.20)
Total Protein: 7.1 g/dL (ref 6.4–8.3)

## 2016-07-22 LAB — CBC WITH DIFFERENTIAL/PLATELET
BASO%: 0.5 % (ref 0.0–2.0)
Basophils Absolute: 0 10*3/uL (ref 0.0–0.1)
EOS%: 3.1 % (ref 0.0–7.0)
Eosinophils Absolute: 0.2 10*3/uL (ref 0.0–0.5)
HCT: 38.6 % (ref 38.4–49.9)
HGB: 12.8 g/dL — ABNORMAL LOW (ref 13.0–17.1)
LYMPH%: 18.5 % (ref 14.0–49.0)
MCH: 28.9 pg (ref 27.2–33.4)
MCHC: 33.2 g/dL (ref 32.0–36.0)
MCV: 86.8 fL (ref 79.3–98.0)
MONO#: 0.7 10*3/uL (ref 0.1–0.9)
MONO%: 13.4 % (ref 0.0–14.0)
NEUT%: 64.5 % (ref 39.0–75.0)
NEUTROS ABS: 3.3 10*3/uL (ref 1.5–6.5)
Platelets: 251 10*3/uL (ref 140–400)
RBC: 4.45 10*6/uL (ref 4.20–5.82)
RDW: 15.2 % — ABNORMAL HIGH (ref 11.0–14.6)
WBC: 5 10*3/uL (ref 4.0–10.3)
lymph#: 0.9 10*3/uL (ref 0.9–3.3)

## 2016-07-22 LAB — UA PROTEIN, DIPSTICK - CHCC: PROTEIN: NEGATIVE mg/dL

## 2016-07-22 LAB — CEA (IN HOUSE-CHCC): CEA (CHCC-In House): <1 ng/mL (ref 0.00–5.00)

## 2016-07-22 MED ORDER — HEPARIN SOD (PORK) LOCK FLUSH 100 UNIT/ML IV SOLN
500.0000 [IU] | Freq: Once | INTRAVENOUS | Status: AC | PRN
Start: 2016-07-22 — End: 2016-07-22
  Administered 2016-07-22: 500 [IU]
  Filled 2016-07-22: qty 5

## 2016-07-22 MED ORDER — SODIUM CHLORIDE 0.9% FLUSH
10.0000 mL | INTRAVENOUS | Status: DC | PRN
  Administered 2016-07-22: 10 mL
  Filled 2016-07-22: qty 10

## 2016-07-22 MED ORDER — SODIUM CHLORIDE 0.9 % IV SOLN
5.1000 mg/kg | Freq: Once | INTRAVENOUS | Status: AC
  Administered 2016-07-22: 500 mg via INTRAVENOUS
  Filled 2016-07-22: qty 16

## 2016-07-22 MED ORDER — SODIUM CHLORIDE 0.9 % IV SOLN
Freq: Once | INTRAVENOUS | Status: AC
  Administered 2016-07-22: 09:00:00 via INTRAVENOUS

## 2016-07-22 NOTE — Patient Instructions (Signed)
[facility] Discharge Instructions for Patients Receiving Chemotherapy  Today you received the following chemotherapy agents Avastin   To help prevent nausea and vomiting after your treatment, we encourage you to take your nausea medication as directed.    If you develop nausea and vomiting that is not controlled by your nausea medication, call the clinic.   BELOW ARE SYMPTOMS THAT SHOULD BE REPORTED IMMEDIATELY:  *FEVER GREATER THAN 100.5 F  *CHILLS WITH OR WITHOUT FEVER  NAUSEA AND VOMITING THAT IS NOT CONTROLLED WITH YOUR NAUSEA MEDICATION  *UNUSUAL SHORTNESS OF BREATH  *UNUSUAL BRUISING OR BLEEDING  TENDERNESS IN MOUTH AND THROAT WITH OR WITHOUT PRESENCE OF ULCERS  *URINARY PROBLEMS  *BOWEL PROBLEMS  UNUSUAL RASH Items with * indicate a potential emergency and should be followed up as soon as possible.  Feel free to call the clinic you have any questions or concerns. The clinic phone number is [phone].  Please show the CHEMO ALERT CARD at check-in to the Emergency Department and triage nurse.   

## 2016-07-25 ENCOUNTER — Other Ambulatory Visit (HOSPITAL_BASED_OUTPATIENT_CLINIC_OR_DEPARTMENT_OTHER): Payer: BLUE CROSS/BLUE SHIELD

## 2016-07-25 ENCOUNTER — Other Ambulatory Visit: Payer: Self-pay | Admitting: Hematology

## 2016-07-25 ENCOUNTER — Ambulatory Visit (HOSPITAL_BASED_OUTPATIENT_CLINIC_OR_DEPARTMENT_OTHER): Payer: BLUE CROSS/BLUE SHIELD

## 2016-07-25 VITALS — BP 137/79 | HR 105 | Temp 98.6°F | Resp 18

## 2016-07-25 DIAGNOSIS — C187 Malignant neoplasm of sigmoid colon: Secondary | ICD-10-CM | POA: Diagnosis not present

## 2016-07-25 DIAGNOSIS — Z5111 Encounter for antineoplastic chemotherapy: Secondary | ICD-10-CM | POA: Diagnosis not present

## 2016-07-25 DIAGNOSIS — C7802 Secondary malignant neoplasm of left lung: Secondary | ICD-10-CM | POA: Diagnosis not present

## 2016-07-25 DIAGNOSIS — C7801 Secondary malignant neoplasm of right lung: Secondary | ICD-10-CM

## 2016-07-25 DIAGNOSIS — C189 Malignant neoplasm of colon, unspecified: Secondary | ICD-10-CM

## 2016-07-25 DIAGNOSIS — C7800 Secondary malignant neoplasm of unspecified lung: Principal | ICD-10-CM

## 2016-07-25 LAB — COMPREHENSIVE METABOLIC PANEL
ALT: 39 U/L (ref 0–55)
ANION GAP: 11 meq/L (ref 3–11)
AST: 22 U/L (ref 5–34)
Albumin: 3.8 g/dL (ref 3.5–5.0)
Alkaline Phosphatase: 92 U/L (ref 40–150)
BUN: 15.4 mg/dL (ref 7.0–26.0)
CHLORIDE: 104 meq/L (ref 98–109)
CO2: 25 meq/L (ref 22–29)
Calcium: 9.5 mg/dL (ref 8.4–10.4)
Creatinine: 0.8 mg/dL (ref 0.7–1.3)
EGFR: >90 mL/min/{1.73_m2} (ref >90)
Glucose: 135 mg/dl (ref 70–140)
Potassium: 4 mEq/L (ref 3.5–5.1)
SODIUM: 140 meq/L (ref 136–145)
Total Bilirubin: 0.37 mg/dL (ref 0.20–1.20)
Total Protein: 7.3 g/dL (ref 6.4–8.3)

## 2016-07-25 LAB — CBC WITH DIFFERENTIAL/PLATELET
BASO%: 0.8 % (ref 0.0–2.0)
BASOS ABS: 0.1 10*3/uL (ref 0.0–0.1)
EOS%: 3.7 % (ref 0.0–7.0)
Eosinophils Absolute: 0.2 10*3/uL (ref 0.0–0.5)
HEMATOCRIT: 39.8 % (ref 38.4–49.9)
HGB: 13.2 g/dL (ref 13.0–17.1)
LYMPH#: 1.2 10*3/uL (ref 0.9–3.3)
LYMPH%: 17.3 % (ref 14.0–49.0)
MCH: 28.7 pg (ref 27.2–33.4)
MCHC: 33 g/dL (ref 32.0–36.0)
MCV: 86.8 fL (ref 79.3–98.0)
MONO#: 0.8 10*3/uL (ref 0.1–0.9)
MONO%: 11.8 % (ref 0.0–14.0)
NEUT#: 4.5 10*3/uL (ref 1.5–6.5)
NEUT%: 66.4 % (ref 39.0–75.0)
Platelets: 242 10*3/uL (ref 140–400)
RBC: 4.59 10*6/uL (ref 4.20–5.82)
RDW: 15.5 % — ABNORMAL HIGH (ref 11.0–14.6)
WBC: 6.7 10*3/uL (ref 4.0–10.3)

## 2016-07-25 MED ORDER — ATROPINE SULFATE 1 MG/ML IJ SOLN: 0.5000 mg | Freq: Once | INTRAMUSCULAR | Status: DC | PRN

## 2016-07-25 MED ORDER — HEPARIN SOD (PORK) LOCK FLUSH 100 UNIT/ML IV SOLN
500.0000 [IU] | Freq: Once | INTRAVENOUS | Status: AC | PRN
Start: 2016-07-25 — End: 2016-07-25
  Administered 2016-07-25: 500 [IU]
  Filled 2016-07-25: qty 5

## 2016-07-25 MED ORDER — PALONOSETRON HCL INJECTION 0.25 MG/5ML
INTRAVENOUS | Status: AC
  Filled 2016-07-25: qty 5

## 2016-07-25 MED ORDER — SODIUM CHLORIDE 0.9% FLUSH
10.0000 mL | INTRAVENOUS | Status: DC | PRN
  Administered 2016-07-25: 10 mL
  Filled 2016-07-25: qty 10

## 2016-07-25 MED ORDER — IRINOTECAN HCL CHEMO INJECTION 100 MG/5ML
160.0000 mg/m2 | Freq: Once | INTRAVENOUS | Status: AC
  Administered 2016-07-25: 340 mg via INTRAVENOUS
  Filled 2016-07-25: qty 15

## 2016-07-25 MED ORDER — SODIUM CHLORIDE 0.9 % IV SOLN
Freq: Once | INTRAVENOUS | Status: AC
  Administered 2016-07-25: 13:00:00 via INTRAVENOUS

## 2016-07-25 MED ORDER — PALONOSETRON HCL INJECTION 0.25 MG/5ML
0.2500 mg | Freq: Once | INTRAVENOUS | Status: AC
  Administered 2016-07-25: 0.25 mg via INTRAVENOUS

## 2016-07-25 NOTE — Patient Instructions (Signed)
[facility] Discharge Instructions for Patients Receiving Chemotherapy  Today you received the following chemotherapy agents Irinotecan  To help prevent nausea and vomiting after your treatment, we encourage you to take your nausea medication as directed.    If you develop nausea and vomiting that is not controlled by your nausea medication, call the clinic.   BELOW ARE SYMPTOMS THAT SHOULD BE REPORTED IMMEDIATELY:  *FEVER GREATER THAN 100.5 F  *CHILLS WITH OR WITHOUT FEVER  NAUSEA AND VOMITING THAT IS NOT CONTROLLED WITH YOUR NAUSEA MEDICATION  *UNUSUAL SHORTNESS OF BREATH  *UNUSUAL BRUISING OR BLEEDING  TENDERNESS IN MOUTH AND THROAT WITH OR WITHOUT PRESENCE OF ULCERS  *URINARY PROBLEMS  *BOWEL PROBLEMS  UNUSUAL RASH Items with * indicate a potential emergency and should be followed up as soon as possible.  Feel free to call the clinic you have any questions or concerns. The clinic phone number is [phone].  Please show the CHEMO ALERT CARD at check-in to the Emergency Department and triage nurse.   

## 2016-07-25 NOTE — Progress Notes (Signed)
Dr. Burr Medico okay to tx with pt HR of 105. Pt complaint of nausea following tx for the past month. Pt states his nausea is not being controlled by aloxi and compazine. Dr. Burr Medico aware and orders are not to be changed at this time. Pt offered options for nausea medications that were refused. Pt verbalized understanding.

## 2016-08-05 ENCOUNTER — Ambulatory Visit (HOSPITAL_BASED_OUTPATIENT_CLINIC_OR_DEPARTMENT_OTHER): Payer: BLUE CROSS/BLUE SHIELD

## 2016-08-05 ENCOUNTER — Ambulatory Visit: Payer: BLUE CROSS/BLUE SHIELD

## 2016-08-05 ENCOUNTER — Other Ambulatory Visit (HOSPITAL_BASED_OUTPATIENT_CLINIC_OR_DEPARTMENT_OTHER): Payer: BLUE CROSS/BLUE SHIELD

## 2016-08-05 VITALS — BP 139/84 | HR 87 | Temp 98.2°F | Resp 18

## 2016-08-05 DIAGNOSIS — C187 Malignant neoplasm of sigmoid colon: Secondary | ICD-10-CM

## 2016-08-05 DIAGNOSIS — Z5112 Encounter for antineoplastic immunotherapy: Secondary | ICD-10-CM | POA: Diagnosis not present

## 2016-08-05 DIAGNOSIS — C7801 Secondary malignant neoplasm of right lung: Secondary | ICD-10-CM

## 2016-08-05 DIAGNOSIS — C7800 Secondary malignant neoplasm of unspecified lung: Principal | ICD-10-CM

## 2016-08-05 DIAGNOSIS — Z95828 Presence of other vascular implants and grafts: Secondary | ICD-10-CM

## 2016-08-05 DIAGNOSIS — C7802 Secondary malignant neoplasm of left lung: Secondary | ICD-10-CM

## 2016-08-05 DIAGNOSIS — C189 Malignant neoplasm of colon, unspecified: Secondary | ICD-10-CM

## 2016-08-05 LAB — CBC WITH DIFFERENTIAL/PLATELET
BASO%: 0.7 % (ref 0.0–2.0)
Basophils Absolute: 0 10*3/uL (ref 0.0–0.1)
EOS ABS: 0.4 10*3/uL (ref 0.0–0.5)
EOS%: 9 % — ABNORMAL HIGH (ref 0.0–7.0)
HEMATOCRIT: 37 % — AB (ref 38.4–49.9)
HEMOGLOBIN: 12.6 g/dL — AB (ref 13.0–17.1)
LYMPH#: 1 10*3/uL (ref 0.9–3.3)
LYMPH%: 21.3 % (ref 14.0–49.0)
MCH: 29.7 pg (ref 27.2–33.4)
MCHC: 34.1 g/dL (ref 32.0–36.0)
MCV: 87 fL (ref 79.3–98.0)
MONO#: 0.7 10*3/uL (ref 0.1–0.9)
MONO%: 15.5 % — ABNORMAL HIGH (ref 0.0–14.0)
NEUT%: 53.5 % (ref 39.0–75.0)
NEUTROS ABS: 2.5 10*3/uL (ref 1.5–6.5)
PLATELETS: 233 10*3/uL (ref 140–400)
RBC: 4.25 10*6/uL (ref 4.20–5.82)
RDW: 15.5 % — AB (ref 11.0–14.6)
WBC: 4.7 10*3/uL (ref 4.0–10.3)

## 2016-08-05 LAB — COMPREHENSIVE METABOLIC PANEL
ALBUMIN: 3.7 g/dL (ref 3.5–5.0)
ALK PHOS: 93 U/L (ref 40–150)
ALT: 48 U/L (ref 0–55)
ANION GAP: 10 meq/L (ref 3–11)
AST: 27 U/L (ref 5–34)
BILIRUBIN TOTAL: 0.27 mg/dL (ref 0.20–1.20)
BUN: 10.6 mg/dL (ref 7.0–26.0)
CO2: 25 mEq/L (ref 22–29)
CREATININE: 0.8 mg/dL (ref 0.7–1.3)
Calcium: 9.4 mg/dL (ref 8.4–10.4)
Chloride: 104 mEq/L (ref 98–109)
Glucose: 116 mg/dl (ref 70–140)
Potassium: 4.6 mEq/L (ref 3.5–5.1)
Sodium: 139 mEq/L (ref 136–145)
TOTAL PROTEIN: 7.1 g/dL (ref 6.4–8.3)

## 2016-08-05 MED ORDER — HEPARIN SOD (PORK) LOCK FLUSH 100 UNIT/ML IV SOLN
500.0000 [IU] | Freq: Once | INTRAVENOUS | Status: AC | PRN
  Administered 2016-08-05: 500 [IU]
  Filled 2016-08-05: qty 5

## 2016-08-05 MED ORDER — SODIUM CHLORIDE 0.9% FLUSH
10.0000 mL | INTRAVENOUS | Status: DC | PRN
  Administered 2016-08-05: 10 mL via INTRAVENOUS
  Filled 2016-08-05: qty 10

## 2016-08-05 MED ORDER — SODIUM CHLORIDE 0.9% FLUSH
10.0000 mL | INTRAVENOUS | Status: DC | PRN
  Administered 2016-08-05: 10 mL
  Filled 2016-08-05: qty 10

## 2016-08-05 MED ORDER — SODIUM CHLORIDE 0.9 % IV SOLN
Freq: Once | INTRAVENOUS | Status: AC
  Administered 2016-08-05: 10:00:00 via INTRAVENOUS

## 2016-08-05 MED ORDER — SODIUM CHLORIDE 0.9 % IV SOLN
5.3000 mg/kg | Freq: Once | INTRAVENOUS | Status: AC
  Administered 2016-08-05: 500 mg via INTRAVENOUS
  Filled 2016-08-05: qty 16

## 2016-08-05 NOTE — Patient Instructions (Signed)
[facility] Discharge Instructions for Patients Receiving Chemotherapy  Today you received the following chemotherapy agents Avastin   To help prevent nausea and vomiting after your treatment, we encourage you to take your nausea medication as directed.    If you develop nausea and vomiting that is not controlled by your nausea medication, call the clinic.   BELOW ARE SYMPTOMS THAT SHOULD BE REPORTED IMMEDIATELY:  *FEVER GREATER THAN 100.5 F  *CHILLS WITH OR WITHOUT FEVER  NAUSEA AND VOMITING THAT IS NOT CONTROLLED WITH YOUR NAUSEA MEDICATION  *UNUSUAL SHORTNESS OF BREATH  *UNUSUAL BRUISING OR BLEEDING  TENDERNESS IN MOUTH AND THROAT WITH OR WITHOUT PRESENCE OF ULCERS  *URINARY PROBLEMS  *BOWEL PROBLEMS  UNUSUAL RASH Items with * indicate a potential emergency and should be followed up as soon as possible.  Feel free to call the clinic you have any questions or concerns. The clinic phone number is [phone].  Please show the CHEMO ALERT CARD at check-in to the Emergency Department and triage nurse.   

## 2016-08-05 NOTE — Patient Instructions (Signed)

## 2016-08-06 ENCOUNTER — Telehealth: Payer: Self-pay | Admitting: *Deleted

## 2016-08-06 NOTE — Telephone Encounter (Signed)
Received vm call from pt stating that he wants to cancel infusion for this Thursday due to having a colonoscopy 08/13/16.  He wants to know if he should still see Dr Burr Medico.   Discussed with Dr Burr Medico & Pt reports with return call that he had lots of nausea "serious" x 12 days with last treatment & vomited on the way out of the cancer center.  Informed that he should keep appt with Dr Burr Medico this Thursday.

## 2016-08-06 NOTE — Progress Notes (Signed)
Bill Wright  [phone] [phone]) [phone]  Clinic Follow Up Note    08/08/2016  CHIEF COMPLAINTS:  Follow Up colon cancer  Oncology History   T2, Colon cancer metastasized to lung [facility])   Staging form: Colon and Rectum, AJCC 7th Edition     Clinical stage from 03/30/2015: Stage Unknown ([place], N1, M1) - Unsigned       Colon cancer metastasized to lung [facility]) s/p laparoscopic assisted sigmoid colectomy 11/02/15   03/30/2015 Miscellaneous    Foundation one genomic testing showed TP53 mutation, MSI stable, low tumor mutation burden. Negative for K-ras, NRAS and BRAF      03/30/2015 Initial Biopsy    Sigmoid mass biopsy showed invasive adenocarcinoma. Cecal colon polyps showed tubular adenoma.      03/30/2015 Initial Diagnosis    Colon cancer      03/30/2015 Procedure    colonoscopy by Dr. Michail Sermon showed a fungating, infiltrative and ulcerated nonobstructing large mass in the sigmoid colon and at 20 cm proximal to the anus. The mass was partially circumferential no bleeding. A 10 mm polyps in the cecum was removed.      04/03/2015 Imaging    CT chest, abdomen and pelvis with contrast showed nodular masslike area of clinical worsening at rectosigmoid junction, tiny pericolonic lymph nodes, bilateral pulmonary nodules measuring about 1 cm.      04/12/2015 PET scan    Hypermetabolic colonic mass near rectosigmoid junction, tiny subcentimeter paracolonic lymph nodes. Bilateral pulmonary metastasis.      04/19/2015 Procedure    CT-guided lung nodule biopsy attempted, unsuccessful.      04/27/2015 - 09/14/2015 Chemotherapy    Oxaliplatin 130 mg/m on day 1, Capecitabine 2348m q12hr, 2 weeks on and one week off (only received 7 days for first cycle), oxaliplatin held on cycle 5 and dose reduced to 1064mm2, capecitabine reduced to 200038m12h D1-14, stopped per pt's request.       06/29/2015 - 10/19/2015 Chemotherapy     Panitumumab every 2 weeks, some  cycles were postponed due to pt's request, stoppe per pt's request       09/18/2015 Imaging    Pulmonary nodules are less hypermetabolic, stable size. Stable hypermetabolic portal hepatis and abdominal peritoneal ligament lymph nodes. No other new lesions.       11/02/2015 Surgery    sigmoid colon segmental resection       11/02/2015 Pathology Results    Sigmoid colon segmental resection showed adenocarcinoma, grade 3,  T2, 3 out of 13 lymph nodes were positive, surgical margins were negative. LVI(-), perineural invasion negative      11/13/2015 Imaging    CT chest, abdomen and pelvis with contrast showed postsurgical changes, mild progression of pulmonary metastasis, measuring up to 15 mm in the right lower lobe.      11/29/2015 - 12/05/2015 Radiation Therapy    SBRT to 4 lung lesions in 3 sessions       04/25/2016 -  Chemotherapy    FOLFIRI every 2 weeks, and Avastin added from cycle 3, 5-FU held since cycle 2 due to poor tolerance        06/27/2016 Imaging    CT CAP 06/27/16 IMPRESSION: Status post partial left hemicolectomy. Progressive wall thickening involving the colon near the suture line, worrisome for residual tumor. Adjacent 7 mm short axis pericolonic lymph node, possibly reflecting a nodal metastasis. Radiation changes in the posterior right upper lobe and superior segment right lower lobe. Progressive pulmonary metastases bilaterally, measuring up  to 13 mm, as above.       HISTORY OF PRESENTING ILLNESS:  Bill Wright 56 y.o. male is here because of recently newly diagnosed colon cancer.  He has had intermittent bloody stool for 2 years, it has been mild, mixed with stool, patient does not have any abdominal pain, constipation, change of his bowel habits, nausea, weight loss or other symptoms. He did not seek medical attention for this. He went to emergency room on 03/15/2015 for right flank pain, due to his kidney stone. CT scan incidentally found a 11 mm right  lower lobe nodule and mucosal edema in the sigmoid colon. He saw his primary care physician, and was referred to GI Dr. Michail Sermon here at he underwent colonoscopy on 03/30/2015, which showed a fungating infiltrative and ulcerated nonobstructing large mass in the sigmoid colon, biopsy showed adenocarcinoma. CT chest abdomen and pelvis showed multiple lung nodules measuring about 1 cm. He was referred to surgeon Dr. Zella Richer, who referred patient to [place] for further workup of his lung nodule and discuss chemotherapy.  He feels very well overall, denies any symptoms. He is a Freight forwarder at [employer], lives with his wife and 3 children. He never had screening colonoscopy prior the reason one, no significant past medical history, does not see doctors regularly.  CURRENT TREATMENT: chemotherapy Irinotecan and Avastin, started on 04/25/2016, he tried FOLFIRI for cycle 2 but tolerated poorly   INTERIM HISTORY Bill Wright returns for follow-up and 8th cycle of chemo. He had a lot of nausea that lasted for a week after his last cycle of chemo. He says it started while he was still in the treatment chair. He took some medication for the nausea, but it hasn't helped. He had one episode of vomiting. The nausea is now gone. He has been eating fine. He would like to reduce his dosage or stop chemo since he has to start work again soon. He didn't feel well when he was on Xeloda, either. He does not want to do the 5-FU pump. His cough has gotten much better. His colonoscopy is on Tuesday. Denies any other concerns.   MEDICAL HISTORY:  Past Medical History:  Diagnosis Date  . Anxiety    situational due to cancer diagnosis  . Cancer [facility]) 2017   colon-chemo 09/22/15 now surgery  . Hypercholesteremia   . Kidney calculi     SURGICAL HISTORY: Past Surgical History:  Procedure Laterality Date  . COLONOSCOPY  03/30/15  . LAPAROSCOPIC PARTIAL COLECTOMY N/A 11/02/2015   Procedure: LAPAROSCOPIC ASSISTED SIGMOID COLECTOMY;   Surgeon: Jackolyn Confer, MD;  Location: [facility];  Service: General;  Laterality: N/A;  . PORTACATH PLACEMENT N/A 05/08/2016   Procedure: INSERTION PORT-A-CATH;  Surgeon: Jackolyn Confer, MD;  Location: [facility];  Service: General;  Laterality: N/A;    SOCIAL HISTORY: Social History   Social History  . Marital Status: Married    Spouse Name: N/A  . Number of Children: 3, age of 35, 50 and 60   . Years of Education: N/A   Occupational History  . Banker for [employer] [place]    Social History Main Topics  . Smoking status: Never Smoker   . Smokeless tobacco: Not on file  . Alcohol Use: No  . Drug Use: No  . Sexual Activity: Not on file   Other Topics Concern  . Not on file   Social History Narrative    FAMILY HISTORY: Family History  Problem Relation Age of Onset  . Breast  cancer Mother 29    +rad and lymph node  . Diabetes Maternal Grandmother     leading to blindness  . Obesity Maternal Aunt   . Stroke Maternal Uncle 65    ALLERGIES:  has No Known Allergies.  MEDICATIONS:  Current Outpatient Prescriptions  Medication Sig Dispense Refill  . ALPRAZolam (XANAX) 0.25 MG tablet Take 1 tablet (0.25 mg total) by mouth 3 (three) times daily as needed for anxiety. 30 tablet 0  . clindamycin (CLINDAGEL) 1 % gel Apply topically 2 (two) times daily. 30 g 5  . cyclobenzaprine (FLEXERIL) 5 MG tablet Take 1 tablet (5 mg total) by mouth 3 (three) times daily as needed for muscle spasms. 30 tablet 0  . HYDROcodone-acetaminophen (NORCO/VICODIN) 5-325 MG tablet Take 1-2 tablets by mouth every 6 (six) hours as needed for moderate pain or severe pain. 20 tablet 0  . hydrocortisone 2.5 % lotion APPLY EXTERNALLY TO THE AFFECTED AREA TWICE DAILY 200 mL 0  . HYDROMET 5-1.5 MG/5ML syrup TK 5 MLS PO Q 6 H PRF COUGH  0  . ibuprofen (ADVIL,MOTRIN) 200 MG tablet Take 600 mg by mouth every 6 (six) hours as needed.    . lidocaine-prilocaine (EMLA) cream Apply 1 application topically as needed. 30 g 1    . ondansetron (ZOFRAN) 8 MG tablet Take 1 tablet (8 mg total) by mouth 2 (two) times daily as needed for refractory nausea / vomiting. Start on day 3 after chemotherapy. 30 tablet 1  . prochlorperazine (COMPAZINE) 10 MG tablet Take 1 tablet (10 mg total) by mouth every 6 (six) hours as needed (NAUSEA). 30 tablet 2   No current facility-administered medications for this visit.     REVIEW OF SYSTEMS: Constitutional: Denies fevers, chills or abnormal night sweats, no weight loss. Eyes: Denies blurriness of vision, double vision or watery eyes Ears, nose, mouth, throat, and face: Denies mucositis or sore throat Respiratory: Denies dyspnea or wheezes (+) cough Cardiovascular: Denies palpitation, chest discomfort or lower extremity swelling Gastrointestinal:  Denies heartburn or change in bowel habits (+) nausea, vomiting Skin: Denies abnormal skin rashes Lymphatics: Denies new lymphadenopathy or easy bruising Neurological:Denies numbness, tingling or new weaknesses Behavioral/Psych: Mood is stable, no new changes  All other systems were reviewed with the patient and are negative.  PHYSICAL EXAMINATION: ECOG PERFORMANCE STATUS: 1  BP 126/86 (BP Location: Left Arm, Patient Position: Sitting)   Pulse 95   Temp 98.7 F (37.1 C) (Oral)   Resp 17   Ht 5' 6"  (1.676 m)   Wt 220 lb 8 oz (100 kg)   SpO2 98%   BMI 35.59 kg/m    GENERAL:alert, no distress and comfortable SKIN: skin color, texture, turgor are normal. No rash noted on the face. EYES: normal, conjunctiva are pink and non-injected, sclera clear OROPHARYNX:no exudate, no erythema and lips, buccal mucosa, and tongue normal  NECK: supple, thyroid normal size, non-tender, without nodularity LYMPH:  no palpable lymphadenopathy in the cervical, axillary or inguinal LUNGS: clear to auscultation and percussion with normal breathing effort HEART: regular rate & rhythm and no murmurs and no lower extremity edema ABDOMEN:abdomen soft,  non-tender and normal bowel sounds. The midline incision has healed well, no discharge or skin erythema  Musculoskeletal:no cyanosis of digits and no clubbing  PSYCH: alert & oriented x 3 with fluent speech NEURO: no focal motor/sensory deficits  LABORATORY DATA:  I have reviewed the data as listed CBC Latest Ref Rng & Units 08/08/2016 08/05/2016 07/25/2016  WBC 4.0 -  10.3 10e3/uL 6.3 4.7 6.7  Hemoglobin 13.0 - 17.1 g/dL 13.8 12.6(L) 13.2  Hematocrit 38.4 - 49.9 % 41.6 37.0(L) 39.8  Platelets 140 - 400 10e3/uL 229 233 242   CMP Latest Ref Rng & Units 08/08/2016 08/05/2016 07/25/2016  Glucose 70 - 140 mg/dl 146(H) 116 135  BUN 7.0 - 26.0 mg/dL 14.7 10.6 15.4  Creatinine 0.7 - 1.3 mg/dL 0.9 0.8 0.8  Sodium 136 - 145 mEq/L 139 139 140  Potassium 3.5 - 5.1 mEq/L 4.2 4.6 4.0  Chloride 101 - 111 mmol/L - - -  CO2 22 - 29 mEq/L 22 25 25   Calcium 8.4 - 10.4 mg/dL 9.3 9.4 9.5  Total Protein 6.4 - 8.3 g/dL 7.5 7.1 7.3  Total Bilirubin 0.20 - 1.20 mg/dL 0.33 0.27 0.37  Alkaline Phos 40 - 150 U/L 99 93 92  AST 5 - 34 U/L 27 27 22   ALT 0 - 55 U/L 48 48 39   CEA:  04/20/2016: <0.5 06/08/2015: <0.5 09/07/2015: <0.5 11/15/2015: 0.9 02/16/2016: <1.0  04/25/2016: 1.6 05/23/16: <1.00 06/20/2016: 1.05 07/22/2016: <1.00  PATHOLOGY REPORT  Diagnosis 11/02/2015 1. Colon, segmental resection for tumor, sigmoid ADENOCARCINOMA OF THE SIGMOID COLON (2.0 CM), GRADE 3 THE TUMOR INVADES MUSCULARIS PROPRIA MARGINS OF RESECTION ARE NEGATIVE METASTATIC ADENOCARCINOMA IN THREE OF THIRTEEN LYMPH NODES (3/13) 2. Colon, resection margin (donut), distal sigmoid BENIGN COLONIC TISSUE Microscopic Comment 1. COLON AND RECTUM (INCLUDING TRANS-ANAL RESECTION): Specimen: Sigmoid Procedure: Segmental resection Tumor site: sigmoid Specimen integrity: Intact Macroscopic intactness of mesorectum: Not applicable: x Complete: NA Near complete: NA Incomplete: NA Cannot be determined (specify): NA Macroscopic tumor  perforation: Muscularis Invasive tumor: Maximum size: 2.0 cm Histologic type(s): Adenocarcinoma Histologic grade and differentiation: G3 G1: well differentiated/low grade G2: moderately differentiated/low grade G3: poorly differentiated/high grade G4: undifferentiated/high grade Type of polyp in which invasive carcinoma arose: Tubular adenoma Microscopic extension of invasive tumor: Muscularis propria Lymph-Vascular invasion: Negative Peri-neural invasion: Negative Tumor deposit(s) (discontinuous extramural extension): Negative Resection margins: Proximal margin: Negative Distal margin: Negative Circumferential (radial) (posterior ascending, posterior descending; lateral and posterior mid-rectum; and entire lower 1/3 rectum):Negative Mesenteric margin (sigmoid and transverse): Negative Distance closest margin (if all above margins negative): 3.5 cm Trans-anal resection margins only: Deep margin: NA Mucosal Margin: NA Distance closest mucosal margin (if negative): NA Treatment effect (neo-adjuvant therapy): Partial Additional polyp(s): None Non-neoplastic findings: unremarkable Lymph nodes: number examined 13; number positive: 3 Pathologic Staging: T2, N1b, M1a     RADIOGRAPHIC STUDIES: I have personally reviewed the radiological images as listed and agreed with the findings in the report.  PET 04/12/2015 IMPRESSION: Hypermetabolic colonic mass near rectosigmoid junction, consistent with known primary colon carcinoma.  Tiny sub-cm pericolonic lymph nodes in sigmoid mesocolon are too small to characterize by PET, but are suspicious for early lymph node metastases.  Bilateral pulmonary metastases  PET 09/18/2015 IMPRESSION: 1. Hypermetabolic pulmonary metastases have enlarged slightly from 07/18/2015. 2. Hypermetabolic porta hepatis/abdominal peritoneal ligament lymph nodes, stable from 04/12/2015. 3. Increase in hypermetabolism associated with mesenteric  haziness and nodularity. While a reactive phenomenon/panniculitis can create this in appearance, metastatic disease/lymphoproliferative disorder cannot be excluded. 4. Hepatic steatosis. 5. Bilateral nephrolithiasis.  CT chest, abdomen and pelvis with contrast 06/27/2016 IMPRESSION: Status post partial left hemicolectomy. Progressive wall thickening involving the colon near the suture line, worrisome for residual tumor.  Adjacent 7 mm short axis pericolonic lymph node, possibly reflecting a nodal metastasis.  Radiation changes in the posterior right upper lobe and superior segment right lower lobe.  Progressive pulmonary metastases bilaterally, measuring up to 13 mm, as above.   ASSESSMENT & PLAN:  56 y.o. male, without significant past medical history except kidney stone, presented with intermittent bloody stool for 2 years, and colonoscopy showed a large sigmoid colon mass, CT scan showed multiple (at least 4) nodules in bilateral lungs, measuring about 1 cm.  1. Sigmoid colon adenocarcinoma, pT2N1bM1, probably stage IV with lung mets, KRA/NRAS wild type, MSI-stable -I previously reviewed his colonoscopy, CT scan findings and the biopsy results in great details with patient and his wife. -I previously reviewed his surgical pathology findings, which showed a residual T2 primary tumor, 3 lymph nodes positive, grade 3 disease, certainly high risk disease.  -We discussed that his disease is likely incurable, and the goal of therapy is maximum disease control and prolong his life.  -- His tumor does not contain KRAS/NRAS or BRAF mutation, so he would benefit from EGFR antibody, Panitumumab was added on from cycle 4. He received a total of 4 month of chemotherapy which was held per patient's request due to the skin rashes.  --I previously personally reviewed his CT scan image from  04/05/2016 and compared with prior CT with pt in person, the previous radiated area in the superior segment  right lower lobe has developed infiltrative change, I suspect this could be cancer progression, although radiation pneumonitis is still also possibility, but it would be unusual giving the timeframe after radiation 4- 5 months ago. He also has a few other small lung nodules has slightly increased in size. -Clinically he had a persistent, slightly worse dry cough. He completed a course of prednisone, his cough was not typical from radiation pneumonitis. -I previously discussed with Dr. Lisbeth Renshaw about his recent CT scan findings, he feels his CT scan finding is not very typical for radiation pneumonitis.  -He has started chemotherapy, cough 90% improved. -He did not tolerate FOLFIRI well, refused to continue, but agrees to continue irinotecan and Avastin. -I previously reviewed his restaging CT scan from 06/27/2016, and compared to his previous CT scans. His infiltrative change in the right lung (could be malignant infiltration versus radiation pneumonitis) has much improved, responding to his clinical improvement on his cough. A few other known nodules has also slightly decreased in size, also one nodule in the infiltrative area seems more solid and slightly bigger. I disagree with the radiologist interpretation, I think dose clinically and radiographically he had good partial response to chemotherapy. -Labs reviewed.  -We again had a long conversation about his long-term treatment plan. He has been tolerating chemotherapy poorly, would like to take a chemo break.   -Due to his nausea and frustrations, will halt chemotherapy and Avastin for a couple of months.  -We discussed future treatment options. He has MSI stable, not to candidate for upfront immunotherapy. We discussed the clinical trial of Keytruda and MEK inhibitor in MSI stable metastatic colon cancer, which has showed some promising results. I discussed possible side effects. If he has disease progression, I'll consider offering him Keytruda and MEK  inhibitor off trial we we can get free drugs for him. -Repeat scans in 6 weeks  -His port was flushed on 08/05/16. Flush again in 6 weeks.   2. Dry cough -His initial cough is likely related to his SBRT,he received a course of prednisone  -I'm concerned he is dry cough to be related to his cancer progression in the lung  -Much improved since he started chemotherapy  3. Obesity  -I again  encouraged him to eat healthy and exercise   4. Chest pain  -he has developed a tender spot in the right anterior low chest, related to cough -I reviewed his hydrocodone, he will use as needed  5. Goal of care discussion  -We again discussed the incurable nature of his cancer, although his cancer seems to be more indolent -The patient understands the goal of care is palliative. -I recommend DNR/DNI, she will think about it    PLAN -Will halt chemotherapy for a couple of months due to his side effects and poor tolerance  -Refill Compazine.  -Repeat scans in mid-March. -I'll see him back in March for flush, labs, and review his scans.   I spent 20 minutes counseling the patient face to face. The total time spent in the appointment was 25 minutes and more than 50% was on counseling.  This document serves as a record of services personally performed by Truitt Merle, MD. It was created on her behalf by Martinique Casey, a trained medical scribe. The creation of this record is based on the scribe's personal observations and the provider's statements to them. This document has been checked and approved by the attending provider.  I have reviewed the above documentation for accuracy and completeness and I agree with the above.   Truitt Merle, MD 08/08/2016

## 2016-08-08 ENCOUNTER — Telehealth: Payer: Self-pay | Admitting: Hematology

## 2016-08-08 ENCOUNTER — Other Ambulatory Visit: Payer: BLUE CROSS/BLUE SHIELD

## 2016-08-08 ENCOUNTER — Other Ambulatory Visit (HOSPITAL_BASED_OUTPATIENT_CLINIC_OR_DEPARTMENT_OTHER): Payer: BLUE CROSS/BLUE SHIELD

## 2016-08-08 ENCOUNTER — Ambulatory Visit: Payer: BLUE CROSS/BLUE SHIELD

## 2016-08-08 ENCOUNTER — Encounter: Payer: Self-pay | Admitting: Hematology

## 2016-08-08 ENCOUNTER — Ambulatory Visit (HOSPITAL_BASED_OUTPATIENT_CLINIC_OR_DEPARTMENT_OTHER): Payer: BLUE CROSS/BLUE SHIELD | Admitting: Hematology

## 2016-08-08 VITALS — BP 126/86 | HR 95 | Temp 98.7°F | Resp 17 | Ht 66.0 in | Wt 220.5 lb

## 2016-08-08 DIAGNOSIS — C189 Malignant neoplasm of colon, unspecified: Secondary | ICD-10-CM

## 2016-08-08 DIAGNOSIS — R05 Cough: Secondary | ICD-10-CM

## 2016-08-08 DIAGNOSIS — C7802 Secondary malignant neoplasm of left lung: Secondary | ICD-10-CM | POA: Diagnosis not present

## 2016-08-08 DIAGNOSIS — C7801 Secondary malignant neoplasm of right lung: Secondary | ICD-10-CM

## 2016-08-08 DIAGNOSIS — C187 Malignant neoplasm of sigmoid colon: Secondary | ICD-10-CM | POA: Diagnosis not present

## 2016-08-08 DIAGNOSIS — R079 Chest pain, unspecified: Secondary | ICD-10-CM | POA: Diagnosis not present

## 2016-08-08 DIAGNOSIS — C7800 Secondary malignant neoplasm of unspecified lung: Principal | ICD-10-CM

## 2016-08-08 DIAGNOSIS — Z95828 Presence of other vascular implants and grafts: Secondary | ICD-10-CM

## 2016-08-08 LAB — CBC WITH DIFFERENTIAL/PLATELET
BASO%: 0.5 % (ref 0.0–2.0)
Basophils Absolute: 0 10*3/uL (ref 0.0–0.1)
EOS ABS: 0.6 10*3/uL — AB (ref 0.0–0.5)
EOS%: 9.7 % — ABNORMAL HIGH (ref 0.0–7.0)
HCT: 41.6 % (ref 38.4–49.9)
HEMOGLOBIN: 13.8 g/dL (ref 13.0–17.1)
LYMPH%: 18.2 % (ref 14.0–49.0)
MCH: 29 pg (ref 27.2–33.4)
MCHC: 33.2 g/dL (ref 32.0–36.0)
MCV: 87.2 fL (ref 79.3–98.0)
MONO#: 0.7 10*3/uL (ref 0.1–0.9)
MONO%: 11.3 % (ref 0.0–14.0)
NEUT%: 60.3 % (ref 39.0–75.0)
NEUTROS ABS: 3.8 10*3/uL (ref 1.5–6.5)
PLATELETS: 229 10*3/uL (ref 140–400)
RBC: 4.78 10*6/uL (ref 4.20–5.82)
RDW: 16.1 % — AB (ref 11.0–14.6)
WBC: 6.3 10*3/uL (ref 4.0–10.3)
lymph#: 1.1 10*3/uL (ref 0.9–3.3)

## 2016-08-08 LAB — COMPREHENSIVE METABOLIC PANEL
ALBUMIN: 3.8 g/dL (ref 3.5–5.0)
ALT: 48 U/L (ref 0–55)
ANION GAP: 10 meq/L (ref 3–11)
AST: 27 U/L (ref 5–34)
Alkaline Phosphatase: 99 U/L (ref 40–150)
BUN: 14.7 mg/dL (ref 7.0–26.0)
CALCIUM: 9.3 mg/dL (ref 8.4–10.4)
CO2: 22 mEq/L (ref 22–29)
CREATININE: 0.9 mg/dL (ref 0.7–1.3)
Chloride: 107 mEq/L (ref 98–109)
EGFR: >90 mL/min/{1.73_m2} (ref >90)
Glucose: 146 mg/dl — ABNORMAL HIGH (ref 70–140)
Potassium: 4.2 mEq/L (ref 3.5–5.1)
Sodium: 139 mEq/L (ref 136–145)
TOTAL PROTEIN: 7.5 g/dL (ref 6.4–8.3)
Total Bilirubin: 0.33 mg/dL (ref 0.20–1.20)

## 2016-08-08 MED ORDER — SODIUM CHLORIDE 0.9% FLUSH
10.0000 mL | INTRAVENOUS | Status: DC | PRN
  Filled 2016-08-08: qty 10

## 2016-08-08 MED ORDER — PROCHLORPERAZINE MALEATE 10 MG PO TABS: 10.0000 mg | ORAL_TABLET | Freq: Four times a day (QID) | ORAL | 2 refills | Status: DC | PRN

## 2016-08-08 NOTE — Progress Notes (Signed)
Patient scheduled for flush. Patient prefers to have IV stick.

## 2016-08-08 NOTE — Telephone Encounter (Signed)
2 bottles of contrast given to patient with a copy of instructions, per CT ordered. Appointments scheduled per 08/08/16 los. Patient was given a copy of the AVS report and appointment schedule per. 08/08/16 los.

## 2016-09-05 ENCOUNTER — Other Ambulatory Visit: Payer: Self-pay | Admitting: Hematology

## 2016-09-05 DIAGNOSIS — C7800 Secondary malignant neoplasm of unspecified lung: Principal | ICD-10-CM

## 2016-09-05 DIAGNOSIS — C189 Malignant neoplasm of colon, unspecified: Secondary | ICD-10-CM

## 2016-09-17 ENCOUNTER — Ambulatory Visit (HOSPITAL_COMMUNITY): Payer: BLUE CROSS/BLUE SHIELD

## 2016-09-17 ENCOUNTER — Ambulatory Visit: Payer: BLUE CROSS/BLUE SHIELD

## 2016-09-17 ENCOUNTER — Ambulatory Visit (HOSPITAL_COMMUNITY)
Admission: RE | Admit: 2016-09-17 | Discharge: 2016-09-17 | Disposition: A | Discharge status: Home / Self Care | Payer: BLUE CROSS/BLUE SHIELD | Source: Ambulatory Visit | Attending: Hematology | Admitting: Hematology

## 2016-09-17 ENCOUNTER — Other Ambulatory Visit (HOSPITAL_BASED_OUTPATIENT_CLINIC_OR_DEPARTMENT_OTHER): Payer: BLUE CROSS/BLUE SHIELD

## 2016-09-17 ENCOUNTER — Encounter (HOSPITAL_COMMUNITY): Payer: Self-pay

## 2016-09-17 VITALS — BP 150/83 | HR 93 | Temp 98.0°F | Resp 18

## 2016-09-17 DIAGNOSIS — Z95828 Presence of other vascular implants and grafts: Secondary | ICD-10-CM

## 2016-09-17 DIAGNOSIS — C189 Malignant neoplasm of colon, unspecified: Secondary | ICD-10-CM | POA: Diagnosis present

## 2016-09-17 DIAGNOSIS — Z923 Personal history of irradiation: Secondary | ICD-10-CM | POA: Diagnosis not present

## 2016-09-17 DIAGNOSIS — C7800 Secondary malignant neoplasm of unspecified lung: Secondary | ICD-10-CM | POA: Diagnosis not present

## 2016-09-17 DIAGNOSIS — R918 Other nonspecific abnormal finding of lung field: Secondary | ICD-10-CM | POA: Insufficient documentation

## 2016-09-17 DIAGNOSIS — C187 Malignant neoplasm of sigmoid colon: Secondary | ICD-10-CM

## 2016-09-17 DIAGNOSIS — C7801 Secondary malignant neoplasm of right lung: Secondary | ICD-10-CM

## 2016-09-17 LAB — CBC WITH DIFFERENTIAL/PLATELET
BASO%: 0.3 % (ref 0.0–2.0)
Basophils Absolute: 0 10*3/uL (ref 0.0–0.1)
EOS ABS: 0.1 10*3/uL (ref 0.0–0.5)
EOS%: 1.9 % (ref 0.0–7.0)
HEMATOCRIT: 40.3 % (ref 38.4–49.9)
HEMOGLOBIN: 13.4 g/dL (ref 13.0–17.1)
LYMPH%: 21.5 % (ref 14.0–49.0)
MCH: 29 pg (ref 27.2–33.4)
MCHC: 33.3 g/dL (ref 32.0–36.0)
MCV: 87.2 fL (ref 79.3–98.0)
MONO#: 0.9 10*3/uL (ref 0.1–0.9)
MONO%: 14.6 % — ABNORMAL HIGH (ref 0.0–14.0)
NEUT%: 61.7 % (ref 39.0–75.0)
NEUTROS ABS: 3.9 10*3/uL (ref 1.5–6.5)
PLATELETS: 201 10*3/uL (ref 140–400)
RBC: 4.62 10*6/uL (ref 4.20–5.82)
RDW: 14.1 % (ref 11.0–14.6)
WBC: 6.4 10*3/uL (ref 4.0–10.3)
lymph#: 1.4 10*3/uL (ref 0.9–3.3)

## 2016-09-17 LAB — COMPREHENSIVE METABOLIC PANEL
ALBUMIN: 3.9 g/dL (ref 3.5–5.0)
ALT: 42 U/L (ref 0–55)
ANION GAP: 10 meq/L (ref 3–11)
AST: 27 U/L (ref 5–34)
Alkaline Phosphatase: 82 U/L (ref 40–150)
BILIRUBIN TOTAL: 0.42 mg/dL (ref 0.20–1.20)
BUN: 12.8 mg/dL (ref 7.0–26.0)
CALCIUM: 9.4 mg/dL (ref 8.4–10.4)
CO2: 26 mEq/L (ref 22–29)
CREATININE: 0.9 mg/dL (ref 0.7–1.3)
Chloride: 105 mEq/L (ref 98–109)
EGFR: >90 mL/min/{1.73_m2} (ref >90)
Glucose: 98 mg/dl (ref 70–140)
Potassium: 4.2 mEq/L (ref 3.5–5.1)
Sodium: 141 mEq/L (ref 136–145)
TOTAL PROTEIN: 7.6 g/dL (ref 6.4–8.3)

## 2016-09-17 LAB — CEA (IN HOUSE-CHCC): CEA (CHCC-In House): <1 ng/mL (ref 0.00–5.00)

## 2016-09-17 MED ORDER — IOPAMIDOL (ISOVUE-300) INJECTION 61%
100.0000 mL | Freq: Once | INTRAVENOUS | Status: AC | PRN
  Administered 2016-09-17: 100 mL via INTRAVENOUS

## 2016-09-17 MED ORDER — HEPARIN SOD (PORK) LOCK FLUSH 100 UNIT/ML IV SOLN: 500.0000 [IU] | Freq: Once | INTRAVENOUS | Status: DC

## 2016-09-17 MED ORDER — IOPAMIDOL (ISOVUE-300) INJECTION 61%
INTRAVENOUS | Status: AC
  Filled 2016-09-17: qty 100

## 2016-09-17 MED ORDER — SODIUM CHLORIDE 0.9% FLUSH
10.0000 mL | INTRAVENOUS | Status: DC | PRN
  Administered 2016-09-17: 10 mL via INTRAVENOUS
  Filled 2016-09-17: qty 10

## 2016-09-17 MED ORDER — HEPARIN SOD (PORK) LOCK FLUSH 100 UNIT/ML IV SOLN
INTRAVENOUS | Status: AC
  Filled 2016-09-17: qty 5

## 2016-09-17 NOTE — Patient Instructions (Signed)
Implanted Port Home Guide An implanted port is a type of central line that is placed under the skin. Central lines are used to provide IV access when treatment or nutrition needs to be given through a person's veins. Implanted ports are used for long-term IV access. An implanted port may be placed because:  You need IV medicine that would be irritating to the small veins in your hands or arms.  You need long-term IV medicines, such as antibiotics.  You need IV nutrition for a long period.  You need frequent blood draws for lab tests.  You need dialysis.  Implanted ports are usually placed in the chest area, but they can also be placed in the upper arm, the abdomen, or the leg. An implanted port has two main parts:  Reservoir. The reservoir is round and will appear as a small, raised area under your skin. The reservoir is the part where a needle is inserted to give medicines or draw blood.  Catheter. The catheter is a thin, flexible tube that extends from the reservoir. The catheter is placed into a large vein. Medicine that is inserted into the reservoir goes into the catheter and then into the vein.  How will I care for my incision site? Do not get the incision site wet. Bathe or shower as directed by your health care provider. How is my port accessed? Special steps must be taken to access the port:  Before the port is accessed, a numbing cream can be placed on the skin. This helps numb the skin over the port site.  Your health care provider uses a sterile technique to access the port. ? Your health care provider must put on a mask and sterile gloves. ? The skin over your port is cleaned carefully with an antiseptic and allowed to dry. ? The port is gently pinched between sterile gloves, and a needle is inserted into the port.  Only "non-coring" port needles should be used to access the port. Once the port is accessed, a blood return should be checked. This helps ensure that the port  is in the vein and is not clogged.  If your port needs to remain accessed for a constant infusion, a clear (transparent) bandage will be placed over the needle site. The bandage and needle will need to be changed every week, or as directed by your health care provider.  Keep the bandage covering the needle clean and dry. Do not get it wet. Follow your health care provider's instructions on how to take a shower or bath while the port is accessed.  If your port does not need to stay accessed, no bandage is needed over the port.  What is flushing? Flushing helps keep the port from getting clogged. Follow your health care provider's instructions on how and when to flush the port. Ports are usually flushed with saline solution or a medicine called heparin. The need for flushing will depend on how the port is used.  If the port is used for intermittent medicines or blood draws, the port will need to be flushed: ? After medicines have been given. ? After blood has been drawn. ? As part of routine maintenance.  If a constant infusion is running, the port may not need to be flushed.  How long will my port stay implanted? The port can stay in for as long as your health care provider thinks it is needed. When it is time for the port to come out, surgery will be   done to remove it. The procedure is similar to the one performed when the port was put in. When should I seek immediate medical care? When you have an implanted port, you should seek immediate medical care if:  You notice a bad smell coming from the incision site.  You have swelling, redness, or drainage at the incision site.  You have more swelling or pain at the port site or the surrounding area.  You have a fever that is not controlled with medicine.  This information is not intended to replace advice given to you by your health care provider. Make sure you discuss any questions you have with your health care provider. Document  Released: 06/24/2005 Document Revised: 11/30/2015 Document Reviewed: 03/01/2013 Elsevier Interactive Patient Education  2017 [employer]  

## 2016-09-17 NOTE — Progress Notes (Addendum)
Bill Wright  [phone] [phone]  Clinic Follow Up Note    09/19/2016  CHIEF COMPLAINTS:  Follow Up colon cancer  Oncology History   T2, Colon cancer metastasized to lung [facility])   Staging form: Colon and Rectum, AJCC 7th Edition     Clinical stage from 03/30/2015: Stage Unknown ([place], N1, M1) - Unsigned       Colon cancer metastasized to lung [facility]) s/p laparoscopic assisted sigmoid colectomy 11/02/15   03/30/2015 Miscellaneous    Foundation one genomic testing showed TP53 mutation, MSI stable, low tumor mutation burden. Negative for K-ras, NRAS and BRAF      03/30/2015 Initial Biopsy    Sigmoid mass biopsy showed invasive adenocarcinoma. Cecal colon polyps showed tubular adenoma.      03/30/2015 Initial Diagnosis    Colon cancer      03/30/2015 Procedure    colonoscopy by Dr. Michail Sermon showed a fungating, infiltrative and ulcerated nonobstructing large mass in the sigmoid colon and at 20 cm proximal to the anus. The mass was partially circumferential no bleeding. A 10 mm polyps in the cecum was removed.      04/03/2015 Imaging    CT chest, abdomen and pelvis with contrast showed nodular masslike area of clinical worsening at rectosigmoid junction, tiny pericolonic lymph nodes, bilateral pulmonary nodules measuring about 1 cm.      04/12/2015 PET scan    Hypermetabolic colonic mass near rectosigmoid junction, tiny subcentimeter paracolonic lymph nodes. Bilateral pulmonary metastasis.      04/19/2015 Procedure    CT-guided lung nodule biopsy attempted, unsuccessful.      04/27/2015 - 09/14/2015 Chemotherapy    Oxaliplatin 130 mg/m on day 1, Capecitabine 2315m q12hr, 2 weeks on and one week off (only received 7 days for first cycle), oxaliplatin held on cycle 5 and dose reduced to 1047mm2, capecitabine reduced to 200020m12h D1-14, stopped per pt's request.       06/29/2015 - 10/19/2015 Chemotherapy     Panitumumab every 2 weeks, some  cycles were postponed due to pt's request, stoppe per pt's request       09/18/2015 Imaging    Pulmonary nodules are less hypermetabolic, stable size. Stable hypermetabolic portal hepatis and abdominal peritoneal ligament lymph nodes. No other new lesions.       11/02/2015 Surgery    sigmoid colon segmental resection       11/02/2015 Pathology Results    Sigmoid colon segmental resection showed adenocarcinoma, grade 3,  T2, 3 out of 13 lymph nodes were positive, surgical margins were negative. LVI(-), perineural invasion negative      11/13/2015 Imaging    CT chest, abdomen and pelvis with contrast showed postsurgical changes, mild progression of pulmonary metastasis, measuring up to 15 mm in the right lower lobe.      11/29/2015 - 12/05/2015 Radiation Therapy    SBRT to 4 lung lesions in 3 sessions       04/25/2016 -  Chemotherapy    FOLFIRI every 2 weeks, and Avastin added from cycle 3, 5-FU held since cycle 2 due to poor tolerance        06/27/2016 Imaging    CT CAP 06/27/16 IMPRESSION: Status post partial left hemicolectomy. Progressive wall thickening involving the colon near the suture line, worrisome for residual tumor. Adjacent 7 mm short axis pericolonic lymph node, possibly reflecting a nodal metastasis. Radiation changes in the posterior right upper lobe and superior segment right lower lobe. Progressive pulmonary metastases bilaterally, measuring  up to 13 mm, as above.      09/17/2016 Imaging    CT A/P/C w contrast  IMPRESSION: 1. Multifocal pulmonary nodules are not significantly changed in size compared with the previous exam. 2. Stable appearance of radiation changes within the right midlung. 3. Stable to scratch set improved appearance of wall thickening involving the colon at the level of the suture line.        HISTORY OF PRESENTING ILLNESS:  Bill Wright 56 y.o. male is here because of recently newly diagnosed colon cancer.  He has had intermittent  bloody stool for 2 years, it has been mild, mixed with stool, patient does not have any abdominal pain, constipation, change of his bowel habits, nausea, weight loss or other symptoms. He did not seek medical attention for this. He went to emergency room on 03/15/2015 for right flank pain, due to his kidney stone. CT scan incidentally found a 11 mm right lower lobe nodule and mucosal edema in the sigmoid colon. He saw his primary care physician, and was referred to GI Dr. Michail Sermon here at he underwent colonoscopy on 03/30/2015, which showed a fungating infiltrative and ulcerated nonobstructing large mass in the sigmoid colon, biopsy showed adenocarcinoma. CT chest abdomen and pelvis showed multiple lung nodules measuring about 1 cm. He was referred to surgeon Dr. Zella Richer, who referred patient to [place] for further workup of his lung nodule and discuss chemotherapy.  He feels very well overall, denies any symptoms. He is a Freight forwarder at [employer], lives with his wife and 3 children. He never had screening colonoscopy prior the reason one, no significant past medical history, does not see doctors regularly.  CURRENT TREATMENT: Observation  INTERIM HISTORY [place] returns for follow-up. He is accompanied by a friend. He is feeling better since coming off chemo. He still experiences dry cough and he takes pain medication for it. Hasn't taken any this week, but last week took one daily. He will return to work full time in 3 weeks. His blood counts are back to normal, all labs look good. Patient is physically active.   MEDICAL HISTORY:  Past Medical History:  Diagnosis Date   Anxiety    situational due to cancer diagnosis   Cancer ([place]) 2017   colon-chemo 09/22/15 now surgery   Hypercholesteremia    Kidney calculi     SURGICAL HISTORY: Past Surgical History:  Procedure Laterality Date   COLONOSCOPY  03/30/15   LAPAROSCOPIC PARTIAL COLECTOMY N/A 11/02/2015   Procedure: LAPAROSCOPIC ASSISTED  SIGMOID COLECTOMY;  Surgeon: Jackolyn Confer, MD;  Location: [facility];  Service: General;  Laterality: N/A;   PORTACATH PLACEMENT N/A 05/08/2016   Procedure: INSERTION PORT-A-CATH;  Surgeon: Jackolyn Confer, MD;  Location: [facility];  Service: General;  Laterality: N/A;    SOCIAL HISTORY: Social History   Social History   Marital Status: Married    Spouse Name: N/A   Number of Children: 26, age of 15, 4 and 60    Years of Education: N/A   Occupational History   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  MEDICATIONS:  Current Outpatient Prescriptions  Medication Sig Dispense Refill   HYDROcodone-acetaminophen (NORCO/VICODIN) 5-325 MG tablet Take 1-2 tablets by mouth every 6 (six) hours as needed for moderate pain or severe pain. 30 tablet 0   hydrocortisone 2.5 % lotion APPLY EXTERNALLY TO THE AFFECTED AREA TWICE DAILY 200 mL 0   ondansetron (ZOFRAN) 8 MG tablet Take 1 tablet (8 mg total) by mouth 2 (two) times daily as needed for refractory nausea / vomiting. Start on day 3 after chemotherapy. 30 tablet 1   prochlorperazine (COMPAZINE) 10 MG tablet Take 1 tablet (10 mg total) by mouth every 6 (six) hours as needed (NAUSEA). 30 tablet 2   ALPRAZolam (XANAX) 0.25 MG tablet Take 1 tablet (0.25 mg total) by mouth 3 (three) times daily as needed for anxiety. (Patient not taking: Reported on 09/19/2016) 30 tablet 0   clindamycin (CLINDAGEL) 1 % gel Apply topically 2 (two) times daily. (Patient not  taking: Reported on 09/19/2016) 30 g 5   cyclobenzaprine (FLEXERIL) 5 MG tablet Take 1 tablet (5 mg total) by mouth 3 (three) times daily as needed for muscle spasms. (Patient not taking: Reported on 09/19/2016) 30 tablet 0   HYDROMET 5-1.5 MG/5ML syrup TK 5 MLS PO Q 6 H PRF COUGH  0   ibuprofen (ADVIL,MOTRIN) 200 MG tablet Take 600 mg by mouth every 6 (six) hours as needed.     lidocaine-prilocaine (EMLA) cream Apply 1 application topically as needed. (Patient not taking: Reported on 09/19/2016) 30 g 1   No current facility-administered medications for this visit.     REVIEW OF SYSTEMS:  Constitutional: Denies fevers, chills or abnormal night sweats, no weight loss. Eyes: Denies blurriness of vision, double vision or watery eyes Ears, nose, mouth, throat, and face: Denies mucositis or sore throat Respiratory: Denies dyspnea or wheezes (+) cough Cardiovascular: Denies palpitation, chest discomfort or lower extremity swelling Gastrointestinal:  Denies heartburn or change in bowel habits Skin: Denies abnormal skin rashes Lymphatics: Denies new lymphadenopathy or easy bruising Neurological:Denies numbness, tingling or new weaknesses Behavioral/Psych: Mood is stable, no new changes  All other systems were reviewed with the patient and are negative.  PHYSICAL EXAMINATION:  ECOG PERFORMANCE STATUS: 0  BP (!) 161/84 (BP Location: Right Arm, Patient Position: Sitting)    Pulse (!) 103    Temp 98.2 F (36.8 C) (Oral)    Resp 18    Ht 5' 6" (1.676 m)    Wt 224 lb 3.2 oz (101.7 kg)    SpO2 100%    BMI 36.19 kg/m    GENERAL:alert, no distress and comfortable SKIN: skin color, texture, turgor are normal. No rash noted on the face. EYES: normal, conjunctiva are pink and non-injected, sclera clear OROPHARYNX:no exudate, no erythema and lips, buccal mucosa, and tongue normal  NECK: supple, thyroid normal size, non-tender, without nodularity LYMPH:  no palpable lymphadenopathy in the cervical,  axillary or inguinal LUNGS: clear to auscultation and percussion with normal breathing effort HEART: regular rate & rhythm and no murmurs and no lower extremity edema ABDOMEN:abdomen soft, non-tender and normal bowel sounds. The midline incision has healed well, no discharge or skin erythema  Musculoskeletal:no cyanosis of digits and no clubbing  PSYCH: alert & oriented x 3 with fluent speech NEURO: no focal  motor/sensory deficits  LABORATORY DATA:  I have reviewed the data as listed CBC Latest Ref Rng & Units 09/17/2016 08/08/2016 08/05/2016  WBC 4.0 - 10.3 10e3/uL 6.4 6.3 4.7  Hemoglobin 13.0 - 17.1 g/dL 13.4 13.8 12.6(L)  Hematocrit 38.4 - 49.9 % 40.3 41.6 37.0(L)  Platelets 140 - 400 10e3/uL 201 229 233   CMP Latest Ref Rng & Units 09/17/2016 08/08/2016 08/05/2016  Glucose 70 - 140 mg/dl 98 146(H) 116  BUN 7.0 - 26.0 mg/dL 12.8 14.7 10.6  Creatinine 0.7 - 1.3 mg/dL 0.9 0.9 0.8  Sodium 136 - 145 mEq/L 141 139 139  Potassium 3.5 - 5.1 mEq/L 4.2 4.2 4.6  Chloride 101 - 111 mmol/L - - -  CO2 22 - 29 mEq/L _0 Calcium 8.4 - 10.4 mg/dL 9.4 9.3 9.4  Total Protein 6.4 - 8.3 g/dL 7.6 7.5 7.1  Total Bilirubin 0.20 - 1.20 mg/dL 0.42 0.33 0.27  Alkaline Phos 40 - 150 U/L 82 99 93  AST 5 - 34 U/L _1 ALT 0 - 55 U/L 42 48 48   CEA:  04/20/2016: <0.5 06/08/2015: <0.5 09/07/2015: <0.5 11/15/2015: 0.9 02/16/2016: <1.0  04/25/2016: 1.6 05/23/16: <1.00 06/20/2016: 1.05 07/22/2016: <1.00 09/19/16: < 1.00   PATHOLOGY REPORT  Diagnosis 11/02/2015 1. Colon, segmental resection for tumor, sigmoid ADENOCARCINOMA OF THE SIGMOID COLON (2.0 CM), GRADE 3 THE TUMOR INVADES MUSCULARIS PROPRIA MARGINS OF RESECTION ARE NEGATIVE METASTATIC ADENOCARCINOMA IN THREE OF THIRTEEN LYMPH NODES (3/13) 2. Colon, resection margin (donut), distal sigmoid BENIGN COLONIC TISSUE     RADIOGRAPHIC STUDIES: I have personally reviewed the radiological images as listed and agreed with the findings in the  report.  PET 04/12/2015 IMPRESSION: Hypermetabolic colonic mass near rectosigmoid junction, consistent with known primary colon carcinoma.  Tiny sub-cm pericolonic lymph nodes in sigmoid mesocolon are too small to characterize by PET, but are suspicious for early lymph node metastases.  Bilateral pulmonary metastases  PET 09/18/2015 IMPRESSION: 1. Hypermetabolic pulmonary metastases have enlarged slightly from 07/18/2015. 2. Hypermetabolic porta hepatis/abdominal peritoneal ligament lymph nodes, stable from 04/12/2015. 3. Increase in hypermetabolism associated with mesenteric haziness and nodularity. While a reactive phenomenon/panniculitis can create this in appearance, metastatic disease/lymphoproliferative disorder cannot be excluded. 4. Hepatic steatosis. 5. Bilateral nephrolithiasis.  CT chest, abdomen and pelvis with contrast 06/27/2016 IMPRESSION: Status post partial left hemicolectomy. Progressive wall thickening involving the colon near the suture line, worrisome for residual tumor.  Adjacent 7 mm short axis pericolonic lymph node, possibly reflecting a nodal metastasis.  Radiation changes in the posterior right upper lobe and superior segment right lower lobe.  Progressive pulmonary metastases bilaterally, measuring up to 13 mm, as above.  Ct abdomen, pelvis and chest with contrast 09/17/16 IMPRESSION: 1. Multifocal pulmonary nodules are not significantly changed in size compared with the previous exam. 2. Stable appearance of radiation changes within the right midlung. 3. Stable to scratch set improved appearance of wall thickening involving the colon at the level of the suture line.   ASSESSMENT & PLAN:  56 y.o. male, without significant past medical history except kidney stone, presented with intermittent bloody stool for 2 years, and colonoscopy showed a large sigmoid colon mass, CT scan showed multiple (at least 4) nodules in bilateral lungs,  measuring about 1 cm.  1. Sigmoid colon adenocarcinoma, pT2N1bM1, probably stage IV with lung mets, KRA/NRAS wild type, MSI-stable -I previously reviewed his colonoscopy, CT scan findings and the biopsy results in great details with patient and his wife. -  I previously reviewed his surgical pathology findings, which showed a residual T2 primary tumor, 3 lymph nodes positive, grade 3 disease, certainly high risk disease.  -We discussed that his disease is likely incurable, and the goal of therapy is maximum disease control and prolong his life.  -- His tumor does not contain KRAS/NRAS or BRAF mutation, so he would benefit from EGFR antibody, Panitumumab was added on from cycle 4. He received a total of 4 month of chemotherapy which was held per patient's request due to the skin rashes.  --I previously personally reviewed his CT scan image from  04/05/2016 and compared with prior CT with pt in person, the previous radiated area in the superior segment right lower lobe has developed infiltrative change, I suspect this could be cancer progression, although radiation pneumonitis is still also possibility, but it would be unusual giving the timeframe after radiation 4- 5 months ago. He also has a few other small lung nodules has slightly increased in size. -Clinically he had a persistent, slightly worse dry cough. He completed a course of prednisone, his cough was not typical from radiation pneumonitis. -I previously discussed with Dr. Lisbeth Renshaw about his recent CT scan findings, he feels his CT scan finding is not very typical for radiation pneumonitis.  -He restarted chemotherapy, cough 90% improved. Chemo held after 08/05/2016 due to poor tolerance  -I reviewed his restaging CT chest from 09/17/2016, which showed stable disease in his lungs.  -He is clinically doing well, mild cough, we decided to continue observation for now  -repeat CT chest in 2 months   2. Dry cough -His initial cough is likely related to  his SBRT,he received a course of prednisone  -I'm concerned he is dry cough to be related to his cancer progression in the lung  -Cough is still present, inconsistent. Had improved while taking chemo but has gotten worse now that he is off.   3. Obesity  -I again encouraged him to eat healthy and exercise   4. Chest pain  -he has developed a tender spot in the right anterior low chest, related to cough -I reviewed his hydrocodone, he will use as needed  5. Goal of care discussion  -We discussed the incurable nature of his cancer, although his cancer seems to be more indolent -The patient understands the goal of care is palliative. -I previously recommend DNR/DNI, she will think about it   6. Allergies - Patient does not take inhaler. I encouraged to take Claritin during the day and Benadryl at night   PLAN  - I will refill hydrocodone   - f/u in 2 months with lab CT Chest w/o contrast -port flush in 4 and 8 weeks   I spent 20 minutes counseling the patient face to face. The total time spent in the appointment was 25 minutes and more than 50% was on counseling.  This document serves as a record of services personally performed by Truitt Merle, MD. It was created on her behalf by Brandt Loosen, a trained medical scribe. The creation of this record is based on the scribe's personal observations and the provider's statements to them. This document has been checked and approved by the attending provider.   I have reviewed the above documentation for accuracy and completeness and I agree with the above.     Truitt Merle, MD 09/19/2016

## 2016-09-17 NOTE — Progress Notes (Signed)
0930: Pt remained accessed for CT scan, pt educated to return to clinic to be deaccessed if not able to be done there. Pt verbalizes understanding. Pt stable at discharge.

## 2016-09-19 ENCOUNTER — Ambulatory Visit (HOSPITAL_BASED_OUTPATIENT_CLINIC_OR_DEPARTMENT_OTHER): Payer: BLUE CROSS/BLUE SHIELD | Admitting: Hematology

## 2016-09-19 ENCOUNTER — Encounter: Payer: Self-pay | Admitting: Hematology

## 2016-09-19 VITALS — BP 161/84 | HR 103 | Temp 98.2°F | Resp 18 | Ht 66.0 in | Wt 224.2 lb

## 2016-09-19 DIAGNOSIS — E668 Other obesity: Secondary | ICD-10-CM

## 2016-09-19 DIAGNOSIS — C7800 Secondary malignant neoplasm of unspecified lung: Secondary | ICD-10-CM | POA: Diagnosis not present

## 2016-09-19 DIAGNOSIS — R0789 Other chest pain: Secondary | ICD-10-CM

## 2016-09-19 DIAGNOSIS — C189 Malignant neoplasm of colon, unspecified: Secondary | ICD-10-CM

## 2016-09-19 DIAGNOSIS — C187 Malignant neoplasm of sigmoid colon: Secondary | ICD-10-CM | POA: Diagnosis not present

## 2016-09-19 DIAGNOSIS — R05 Cough: Secondary | ICD-10-CM | POA: Diagnosis not present

## 2016-09-19 MED ORDER — HYDROCODONE-ACETAMINOPHEN 5-325 MG PO TABS: 1.0000 | ORAL_TABLET | Freq: Four times a day (QID) | ORAL | 0 refills | Status: DC | PRN

## 2016-09-21 ENCOUNTER — Telehealth: Payer: Self-pay | Admitting: Hematology

## 2016-09-21 NOTE — Telephone Encounter (Signed)
Left message re appointments for 4/12 and 5/10.

## 2016-09-24 NOTE — Telephone Encounter (Signed)
Schedule mailed.  °

## 2016-10-17 ENCOUNTER — Ambulatory Visit (HOSPITAL_BASED_OUTPATIENT_CLINIC_OR_DEPARTMENT_OTHER): Payer: BLUE CROSS/BLUE SHIELD

## 2016-10-17 DIAGNOSIS — C187 Malignant neoplasm of sigmoid colon: Secondary | ICD-10-CM | POA: Diagnosis not present

## 2016-10-17 DIAGNOSIS — Z452 Encounter for adjustment and management of vascular access device: Secondary | ICD-10-CM | POA: Diagnosis not present

## 2016-10-17 DIAGNOSIS — Z95828 Presence of other vascular implants and grafts: Secondary | ICD-10-CM

## 2016-10-17 DIAGNOSIS — C7801 Secondary malignant neoplasm of right lung: Secondary | ICD-10-CM

## 2016-10-17 DIAGNOSIS — C7800 Secondary malignant neoplasm of unspecified lung: Secondary | ICD-10-CM

## 2016-10-17 MED ORDER — ALTEPLASE 2 MG IJ SOLR
2.0000 mg | Freq: Once | INTRAMUSCULAR | Status: DC | PRN
Start: 2016-10-17 — End: 2016-10-17
  Filled 2016-10-17: qty 2

## 2016-10-17 MED ORDER — SODIUM CHLORIDE 0.9% FLUSH
10.0000 mL | INTRAVENOUS | Status: DC | PRN
  Administered 2016-10-17: 10 mL via INTRAVENOUS
  Filled 2016-10-17: qty 10

## 2016-10-17 MED ORDER — HEPARIN SOD (PORK) LOCK FLUSH 100 UNIT/ML IV SOLN
500.0000 [IU] | Freq: Once | INTRAVENOUS | Status: AC | PRN
  Administered 2016-10-17: 500 [IU] via INTRAVENOUS
  Filled 2016-10-17: qty 5

## 2016-10-17 NOTE — Progress Notes (Signed)
  Port flushed several times with no blood flow. Pt sent back to treatment room for cathflow placement. Devaunte Gasparini LPN

## 2016-10-17 NOTE — Progress Notes (Signed)
Pt came from F1 for cathflo. No other appts today; cathflo not needed since patient does not receive treatment and no labs ordered.  Pt inquired (after port deaccess) about having a scan prior to seeing Dr. Burr Medico 5/10.  Inbasket message sent to collab RN and Dr. Burr Medico.  I told pt we would be in touch, and to expect to stay longer next time due to possibility of needing cathflo. He voiced understanding and was satisfied with plan.

## 2016-10-17 NOTE — Addendum Note (Signed)
Addended by: Truitt Merle on: 10/17/2016 11:50 AM   Modules accepted: Orders

## 2016-10-21 ENCOUNTER — Telehealth: Payer: Self-pay | Admitting: *Deleted

## 2016-10-21 ENCOUNTER — Other Ambulatory Visit: Payer: Self-pay | Admitting: Hematology

## 2016-10-21 MED ORDER — FLUTICASONE-SALMETEROL 100-50 MCG/DOSE IN AEPB: 1.0000 | INHALATION_SPRAY | Freq: Two times a day (BID) | RESPIRATORY_TRACT | 0 refills | Status: DC

## 2016-10-21 NOTE — Progress Notes (Unsigned)
advir

## 2016-10-21 NOTE — Telephone Encounter (Signed)
Pt called requesting an inhaler - as discussed by Dr. Burr Medico at his last office visit.   Pt's    Phone      [phone].

## 2016-10-28 ENCOUNTER — Other Ambulatory Visit: Payer: Self-pay | Admitting: *Deleted

## 2016-10-28 ENCOUNTER — Telehealth: Payer: Self-pay | Admitting: *Deleted

## 2016-10-28 DIAGNOSIS — C189 Malignant neoplasm of colon, unspecified: Secondary | ICD-10-CM

## 2016-10-28 DIAGNOSIS — C7800 Secondary malignant neoplasm of unspecified lung: Principal | ICD-10-CM

## 2016-10-28 MED ORDER — HYDROCODONE-ACETAMINOPHEN 5-325 MG PO TABS: 1.0000 | ORAL_TABLET | Freq: Four times a day (QID) | ORAL | 0 refills | Status: DC | PRN

## 2016-10-28 NOTE — Telephone Encounter (Signed)
Pt called and left message requesting refill of Hydrocodone.

## 2016-11-11 ENCOUNTER — Ambulatory Visit (HOSPITAL_COMMUNITY)
Admission: RE | Admit: 2016-11-11 | Discharge: 2016-11-11 | Disposition: A | Discharge status: Home / Self Care | Payer: BLUE CROSS/BLUE SHIELD | Source: Ambulatory Visit | Attending: Hematology | Admitting: Hematology

## 2016-11-11 DIAGNOSIS — K760 Fatty (change of) liver, not elsewhere classified: Secondary | ICD-10-CM | POA: Diagnosis not present

## 2016-11-11 DIAGNOSIS — C189 Malignant neoplasm of colon, unspecified: Secondary | ICD-10-CM | POA: Diagnosis present

## 2016-11-11 DIAGNOSIS — C7800 Secondary malignant neoplasm of unspecified lung: Secondary | ICD-10-CM | POA: Insufficient documentation

## 2016-11-11 DIAGNOSIS — I2510 Atherosclerotic heart disease of native coronary artery without angina pectoris: Secondary | ICD-10-CM | POA: Diagnosis not present

## 2016-11-13 NOTE — Progress Notes (Signed)
[place]  Telephone:([phone]) [phone] Fax:(336) [phone]  Clinic Follow Up Note    11/14/2016  CHIEF COMPLAINTS:  Follow Up colon cancer  Oncology History   T2, Colon cancer metastasized to lung [facility])   Staging form: Colon and Rectum, AJCC 7th Edition     Clinical stage from 03/30/2015: Stage Unknown ([place], N1, M1) - Unsigned       Colon cancer metastasized to lung [facility]) s/p laparoscopic assisted sigmoid colectomy 11/02/15   03/30/2015 Miscellaneous    Foundation one genomic testing showed TP53 mutation, MSI stable, low tumor mutation burden. Negative for K-ras, NRAS and BRAF      03/30/2015 Initial Biopsy    Sigmoid mass biopsy showed invasive adenocarcinoma. Cecal colon polyps showed tubular adenoma.      03/30/2015 Initial Diagnosis    Colon cancer      03/30/2015 Procedure    colonoscopy by Dr. Michail Sermon showed a fungating, infiltrative and ulcerated nonobstructing large mass in the sigmoid colon and at 20 cm proximal to the anus. The mass was partially circumferential no bleeding. A 10 mm polyps in the cecum was removed.      04/03/2015 Imaging    CT chest, abdomen and pelvis with contrast showed nodular masslike area of clinical worsening at rectosigmoid junction, tiny pericolonic lymph nodes, bilateral pulmonary nodules measuring about 1 cm.      04/12/2015 PET scan    Hypermetabolic colonic mass near rectosigmoid junction, tiny subcentimeter paracolonic lymph nodes. Bilateral pulmonary metastasis.      04/19/2015 Procedure    CT-guided lung nodule biopsy attempted, unsuccessful.      04/27/2015 - 09/14/2015 Chemotherapy    Oxaliplatin 130 mg/m on day 1, Capecitabine 2314m q12hr, 2 weeks on and one week off (only received 7 days for first cycle), oxaliplatin held on cycle 5 and dose reduced to 1045mm2, capecitabine reduced to 200031m12h D1-14, stopped per pt's request.       06/29/2015 - 10/19/2015 Chemotherapy     Panitumumab every 2 weeks, some  cycles were postponed due to pt's request, stoppe per pt's request       09/18/2015 Imaging    Pulmonary nodules are less hypermetabolic, stable size. Stable hypermetabolic portal hepatis and abdominal peritoneal ligament lymph nodes. No other new lesions.       11/02/2015 Surgery    sigmoid colon segmental resection       11/02/2015 Pathology Results    Sigmoid colon segmental resection showed adenocarcinoma, grade 3,  T2, 3 out of 13 lymph nodes were positive, surgical margins were negative. LVI(-), perineural invasion negative      11/13/2015 Imaging    CT chest, abdomen and pelvis with contrast showed postsurgical changes, mild progression of pulmonary metastasis, measuring up to 15 mm in the right lower lobe.      11/29/2015 - 12/05/2015 Radiation Therapy    SBRT to 4 lung lesions in 3 sessions       04/25/2016 - 08/05/2016 Chemotherapy    FOLFIRI every 2 weeks, and Avastin added from cycle 3, 5-FU held since cycle 2 due to poor tolerance. Chemo was stopped after 3 months due to overall poor tolerance and pt's request to stop.       06/27/2016 Imaging    CT CAP 06/27/16 IMPRESSION: Status post partial left hemicolectomy. Progressive wall thickening involving the colon near the suture line, worrisome for residual tumor. Adjacent 7 mm short axis pericolonic lymph node, possibly reflecting a nodal metastasis. Radiation changes in the posterior  right upper lobe and superior segment right lower lobe. Progressive pulmonary metastases bilaterally, measuring up to 13 mm, as above.      09/17/2016 Imaging    CT A/P/C w contrast  IMPRESSION: 1. Multifocal pulmonary nodules are not significantly changed in size compared with the previous exam. 2. Stable appearance of radiation changes within the right midlung. 3. Stable to scratch set improved appearance of wall thickening involving the colon at the level of the suture line.       11/11/2016 Imaging    CT Chest WO Contrast  11/11/16: IMPRESSION: 1. Minimal enlargement of some pulmonary metastases. 2. Coronary artery calcification. 3. Hepatic steatosis.        HISTORY OF PRESENTING ILLNESS:  Bill Wright 56 y.o. male is here because of recently newly diagnosed colon cancer.  He has had intermittent bloody stool for 2 years, it has been mild, mixed with stool, patient does not have any abdominal pain, constipation, change of his bowel habits, nausea, weight loss or other symptoms. He did not seek medical attention for this. He went to emergency room on 03/15/2015 for right flank pain, due to his kidney stone. CT scan incidentally found a 11 mm right lower lobe nodule and mucosal edema in the sigmoid colon. He saw his primary care physician, and was referred to GI Dr. Michail Sermon here at he underwent colonoscopy on 03/30/2015, which showed a fungating infiltrative and ulcerated nonobstructing large mass in the sigmoid colon, biopsy showed adenocarcinoma. CT chest abdomen and pelvis showed multiple lung nodules measuring about 1 cm. He was referred to surgeon Dr. Zella Richer, who referred patient to [place] for further workup of his lung nodule and discuss chemotherapy.  He feels very well overall, denies any symptoms. 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 He never had screening colonoscopy prior the reason one, no significant past medical history, does not see doctors regularly.  CURRENT TREATMENT: Observation  INTERIM HISTORY Plez returns for follow-up. HE presents to the clinic today. He reports feeling good and back to work. He reports he would like more ADVAIR. Hew says it works out great for him and wonders if there is a stronger issues. He stopped hte cough syrup and just uses ADVAIR twice a d ay. He denies any other pain. He reports his energy is back to normal and denies irregular BM or stomach issue. The port has TPA in it. He reports no oral issue with the steroid in [place]. The  intensity of the Job is increased but he says it is not stressful. He still wants to change his position at work. He takes Hydrocodone only mid day and he says it releases him so he does not feel the tension in his chest. He does not like XANAX he says it does not work for him. He stays active but has not lost weight yet    MEDICAL HISTORY:  Past Medical History:  Diagnosis Date  . Anxiety    situational due to cancer diagnosis  . Cancer [facility]) 2017   colon-chemo 09/22/15 now surgery  . Hypercholesteremia   . Kidney calculi     SURGICAL HISTORY: Past Surgical History:  Procedure Laterality Date  . COLONOSCOPY  03/30/15  . LAPAROSCOPIC PARTIAL COLECTOMY N/A 11/02/2015   Procedure: LAPAROSCOPIC ASSISTED SIGMOID COLECTOMY;  Surgeon: Jackolyn Confer, MD;  Location: [facility];  Service: General;  Laterality: N/A;  . PORTACATH PLACEMENT N/A 05/08/2016   Procedure: INSERTION PORT-A-CATH;  Surgeon: Sherren Mocha  Rosenbower, MD;  Location: [facility];  Service: General;  Laterality: N/A;    SOCIAL HISTORY: Social History   Social History  . Marital Status: Married    Spouse Name: N/A  . Number of Children: 3, age of 92, 3 and 24   . Years of Education: N/A   Occupational History  . Banker for [employer] [place]    Social History Main Topics  . Smoking status: Never Smoker   . Smokeless tobacco: Not on file  . Alcohol Use: No  . Drug Use: No  . Sexual Activity: Not on file   Other Topics Concern  . Not on file   Social History Narrative    FAMILY HISTORY: Family History  Problem Relation Age of Onset  . Breast cancer Mother 82       +rad and lymph node  . Diabetes Maternal Grandmother        leading to blindness  . Obesity Maternal Aunt   . Stroke Maternal Uncle 65    ALLERGIES:  has No Known Allergies.  MEDICATIONS:  Current Outpatient Prescriptions  Medication Sig Dispense Refill  . HYDROcodone-acetaminophen (NORCO/VICODIN) 5-325 MG tablet Take 1-2 tablets by mouth every 6 (six)  hours as needed for moderate pain or severe pain. 30 tablet 0  . hydrocortisone 2.5 % lotion APPLY EXTERNALLY TO THE AFFECTED AREA TWICE DAILY 200 mL 0  . HYDROMET 5-1.5 MG/5ML syrup TK 5 MLS PO Q 6 H PRF COUGH  0  . ibuprofen (ADVIL,MOTRIN) 200 MG tablet Take 600 mg by mouth every 6 (six) hours as needed.    . lidocaine-prilocaine (EMLA) cream Apply 1 application topically as needed. 30 g 1  . traMADol (ULTRAM) 50 MG tablet Take 1 tablet (50 mg total) by mouth every 6 (six) hours as needed. 20 tablet 0  . ALPRAZolam (XANAX) 0.25 MG tablet Take 1 tablet (0.25 mg total) by mouth 3 (three) times daily as needed for anxiety. (Patient not taking: Reported on 09/19/2016) 30 tablet 0  . clindamycin (CLINDAGEL) 1 % gel Apply topically 2 (two) times daily. (Patient not taking: Reported on 09/19/2016) 30 g 5  . cyclobenzaprine (FLEXERIL) 5 MG tablet Take 1 tablet (5 mg total) by mouth 3 (three) times daily as needed for muscle spasms. (Patient not taking: Reported on 09/19/2016) 30 tablet 0  . Fluticasone-Salmeterol (ADVAIR DISKUS) 250-50 MCG/DOSE AEPB Inhale 1 puff into the lungs 2 (two) times daily. 60 each 1  . ondansetron (ZOFRAN) 8 MG tablet Take 1 tablet (8 mg total) by mouth 2 (two) times daily as needed for refractory nausea / vomiting. Start on day 3 after chemotherapy. (Patient not taking: Reported on 11/14/2016) 30 tablet 1  . prochlorperazine (COMPAZINE) 10 MG tablet Take 1 tablet (10 mg total) by mouth every 6 (six) hours as needed (NAUSEA). (Patient not taking: Reported on 11/14/2016) 30 tablet 2  . sildenafil (VIAGRA) 100 MG tablet   5   No current facility-administered medications for this visit.     REVIEW OF SYSTEMS:  Constitutional: Denies fevers, chills or abnormal night sweats, no weight loss. Eyes: Denies blurriness of vision, double vision or watery eyes Ears, nose, mouth, throat, and face: Denies mucositis or sore throat Respiratory: Denies dyspnea or wheezes (+) cough Cardiovascular:  Denies palpitation, chest discomfort or lower extremity swelling Gastrointestinal:  Denies heartburn or change in bowel habits (+) nausea, vomiting Skin: Denies abnormal skin rashes Lymphatics: Denies new lymphadenopathy or easy bruising Neurological:Denies numbness, tingling or new weaknesses Behavioral/Psych:  Mood is stable, no new changes  All other systems were reviewed with the patient and are negative.  PHYSICAL EXAMINATION: ECOG PERFORMANCE STATUS: 0  BP 132/81 (BP Location: Left Arm, Patient Position: Sitting)   Pulse (!) 103   Temp 99.1 F (37.3 C) (Oral)   Resp 18   Ht 5' 6"  (1.676 m)   Wt 223 lb 14.4 oz (101.6 kg)   SpO2 99%   BMI 36.14 kg/m    GENERAL:alert, no distress and comfortable SKIN: skin color, texture, turgor are normal. No rash noted on the face. EYES: normal, conjunctiva are pink and non-injected, sclera clear OROPHARYNX:no exudate, no erythema and lips, buccal mucosa, and tongue normal  NECK: supple, thyroid normal size, non-tender, without nodularity LYMPH:  no palpable lymphadenopathy in the cervical, axillary or inguinal LUNGS: clear to auscultation and percussion with normal breathing effort HEART: regular rate & rhythm and no murmurs and no lower extremity edema ABDOMEN:abdomen soft, non-tender and normal bowel sounds. The midline incision has healed well, no discharge or skin erythema  Musculoskeletal:no cyanosis of digits and no clubbing  PSYCH: alert & oriented x 3 with fluent speech NEURO: no focal motor/sensory deficits  LABORATORY DATA:  I have reviewed the data as listed CBC Latest Ref Rng & Units 11/14/2016 09/17/2016 08/08/2016  WBC 4.0 - 10.3 10e3/uL 6.2 6.4 6.3  Hemoglobin 13.0 - 17.1 g/dL 13.6 13.4 13.8  Hematocrit 38.4 - 49.9 % 41.5 40.3 41.6  Platelets 140 - 400 10e3/uL 202 201 229   CMP Latest Ref Rng & Units 11/14/2016 09/17/2016 08/08/2016  Glucose 70 - 140 mg/dl 134 98 146(H)  BUN 7.0 - 26.0 mg/dL 12.1 12.8 14.7  Creatinine 0.7 -  1.3 mg/dL 0.8 0.9 0.9  Sodium 136 - 145 mEq/L 139 141 139  Potassium 3.5 - 5.1 mEq/L 4.2 4.2 4.2  Chloride 101 - 111 mmol/L - - -  CO2 22 - 29 mEq/L 25 26 22   Calcium 8.4 - 10.4 mg/dL 9.3 9.4 9.3  Total Protein 6.4 - 8.3 g/dL 7.6 7.6 7.5  Total Bilirubin 0.20 - 1.20 mg/dL 0.59 0.42 0.33  Alkaline Phos 40 - 150 U/L 88 82 99  AST 5 - 34 U/L 27 27 27   ALT 0 - 55 U/L 45 42 48   CEA:  04/20/2016: <0.5 06/08/2015: <0.5 09/07/2015: <0.5 11/15/2015: 0.9 02/16/2016: <1.0  04/25/2016: 1.6 05/23/16: <1.00 06/20/2016: 1.05 07/22/2016: <1.00 09/17/16: <1.00 11/14/16: PENDING   PATHOLOGY REPORT  Diagnosis 11/02/2015 1. Colon, segmental resection for tumor, sigmoid ADENOCARCINOMA OF THE SIGMOID COLON (2.0 CM), GRADE 3 THE TUMOR INVADES MUSCULARIS PROPRIA MARGINS OF RESECTION ARE NEGATIVE METASTATIC ADENOCARCINOMA IN THREE OF THIRTEEN LYMPH NODES (3/13) 2. Colon, resection margin (donut), distal sigmoid BENIGN COLONIC TISSUE Microscopic Comment 1. COLON AND RECTUM (INCLUDING TRANS-ANAL RESECTION): Specimen: Sigmoid Procedure: Segmental resection Tumor site: sigmoid Specimen integrity: Intact Macroscopic intactness of mesorectum: Not applicable: x Complete: NA Near complete: NA Incomplete: NA Cannot be determined (specify): NA Macroscopic tumor perforation: Muscularis Invasive tumor: Maximum size: 2.0 cm Histologic type(s): Adenocarcinoma Histologic grade and differentiation: G3 G1: well differentiated/low grade G2: moderately differentiated/low grade G3: poorly differentiated/high grade G4: undifferentiated/high grade Type of polyp in which invasive carcinoma arose: Tubular adenoma Microscopic extension of invasive tumor: Muscularis propria Lymph-Vascular invasion: Negative Peri-neural invasion: Negative Tumor deposit(s) (discontinuous extramural extension): Negative Resection margins: Proximal margin: Negative Distal margin: Negative Circumferential (radial) (posterior  ascending, posterior descending; lateral and posterior mid-rectum; and entire lower 1/3 rectum):Negative Mesenteric margin (sigmoid and transverse): Negative  Distance closest margin (if all above margins negative): 3.5 cm Trans-anal resection margins only: Deep margin: NA Mucosal Margin: NA Distance closest mucosal margin (if negative): NA Treatment effect (neo-adjuvant therapy): Partial Additional polyp(s): None Non-neoplastic findings: unremarkable Lymph nodes: number examined 13; number positive: 3 Pathologic Staging: T2, N1b, M1a     RADIOGRAPHIC STUDIES: I have personally reviewed the radiological images as listed and agreed with the findings in the report.  PET 04/12/2015 IMPRESSION: Hypermetabolic colonic mass near rectosigmoid junction, consistent with known primary colon carcinoma.  Tiny sub-cm pericolonic lymph nodes in sigmoid mesocolon are too small to characterize by PET, but are suspicious for early lymph node metastases.  Bilateral pulmonary metastases  PET 09/18/2015 IMPRESSION: 1. Hypermetabolic pulmonary metastases have enlarged slightly from 07/18/2015. 2. Hypermetabolic porta hepatis/abdominal peritoneal ligament lymph nodes, stable from 04/12/2015. 3. Increase in hypermetabolism associated with mesenteric haziness and nodularity. While a reactive phenomenon/panniculitis can create this in appearance, metastatic disease/lymphoproliferative disorder cannot be excluded. 4. Hepatic steatosis. 5. Bilateral nephrolithiasis.  CT chest, abdomen and pelvis with contrast 06/27/2016 IMPRESSION: Status post partial left hemicolectomy. Progressive wall thickening involving the colon near the suture line, worrisome for residual tumor.  Adjacent 7 mm short axis pericolonic lymph node, possibly reflecting a nodal metastasis.  Radiation changes in the posterior right upper lobe and superior segment right lower lobe.  Progressive pulmonary metastases  bilaterally, measuring up to 13 mm, as above.   CT A/P/C w contrast 09/17/16:  IMPRESSION: 1. Multifocal pulmonary nodules are not significantly changed in size compared with the previous exam. 2. Stable appearance of radiation changes within the right midlung. 3. Stable to scratch set improved appearance of wall thickening involving the colon at the level of the suture line.   CT Chest WO Contrast 11/11/16: IMPRESSION: 1. Minimal enlargement of some pulmonary metastases. 2. Coronary artery calcification. 3. Hepatic steatosis.   ASSESSMENT & PLAN:  56 y.o. male, without significant past medical history except kidney stone, presented with intermittent bloody stool for 2 years, and colonoscopy showed a large sigmoid colon mass, CT scan showed multiple (at least 4) nodules in bilateral lungs, measuring about 1 cm.  1. Sigmoid colon adenocarcinoma, pT2N1bM1, probably stage IV with lung mets, KRA/NRAS wild type, MSI-stable -I previously reviewed his colonoscopy, initial CT scan findings and the biopsy results in great details with patient and his wife. -he received first line chemo FOLFOX and panitumumab, followed by hemicolectomy, SBRT to 4 lung nodules, and second line chemo irinotecan and avastin, chemotherapy was finally stopped due to his poor tolerance. -I previously reviewed his surgical pathology findings, which showed a residual T2 primary tumor, 3 lymph nodes positive, grade 3 disease, certainly high risk disease.  -We discussed that his disease is likely incurable, and the goal of therapy is maximum disease control and prolong his life.  -- His tumor does not contain KRAS/NRAS or BRAF mutation, so he would benefit from EGFR antibody, Panitumumab was added on from cycle 4 but tolerated poorly -He is off chemotherapy now, clinically doing well, with mild dry cough, otherwise asymptomatic. -I reviewed his restaging CT chest, which showed slightly increased the size of his multiple  nodules, but overall tumor burden is very low.  -Patient prefers to continue chemotherapy break, will follow up with a repeated CT scan in 3 months.   2. Dry cough -His initial cough is likely related to his SBRT,he received a course of prednisone  -I'm concerned he is dry cough to be related to his cancer  progression in the lung  -previously Much improved since he started chemotherapy -He is responding well  To ADVAIR -Increased and refilled his dose of ADVAIR  3. Obesity  -I again encouraged him to eat healthy and exercise   4. Chest pain  -he has developed a tender spot in the right anterior low chest, related to cough -I previously reviewed his hydrocodone, he will use as needed  5. Goal of care discussion  -We again discussed the incurable nature of his cancer, although his cancer seems to be more indolent -The patient understands the goal of care is palliative. -He is full code now   6. Pain medication -He has been taking Oxycodone once a day as needed due to his high intensity job and some chest discomfort which could be related to stress.  -He really does not have much cancer related pain. We discussed narcotic addiction, I recommend him to gradually wean off oxycodone. He agrees. -I ordered Tramadol    PLAN -Continue observation  -f/u in 3 months with lab and restaging CT CAP with contrast a few days before  -Ordered Tramadol today and increased/refill ADVAIR  -Port flush in 6 weeks    I spent 25 minutes counseling the patient face to face. The total time spent in the appointment was 30 minutes and more than 50% was on counseling.  This document serves as a record of services personally performed by Truitt Merle, MD. It was created on her behalf by Joslyn Devon, a trained medical scribe. The creation of this record is based on the scribe's personal observations and the provider's statements to them. This document has been checked and approved by the attending provider.    I have reviewed the above documentation for accuracy and completeness and I agree with the above.   Truitt Merle, MD 11/14/2016

## 2016-11-14 ENCOUNTER — Other Ambulatory Visit (HOSPITAL_BASED_OUTPATIENT_CLINIC_OR_DEPARTMENT_OTHER): Payer: BLUE CROSS/BLUE SHIELD

## 2016-11-14 ENCOUNTER — Other Ambulatory Visit: Payer: Self-pay | Admitting: *Deleted

## 2016-11-14 ENCOUNTER — Ambulatory Visit: Payer: BLUE CROSS/BLUE SHIELD

## 2016-11-14 ENCOUNTER — Ambulatory Visit (HOSPITAL_BASED_OUTPATIENT_CLINIC_OR_DEPARTMENT_OTHER): Payer: BLUE CROSS/BLUE SHIELD | Admitting: Hematology

## 2016-11-14 VITALS — BP 132/81 | HR 103 | Temp 99.1°F | Resp 18 | Ht 66.0 in | Wt 223.9 lb

## 2016-11-14 DIAGNOSIS — Z452 Encounter for adjustment and management of vascular access device: Secondary | ICD-10-CM | POA: Diagnosis not present

## 2016-11-14 DIAGNOSIS — R05 Cough: Secondary | ICD-10-CM

## 2016-11-14 DIAGNOSIS — C187 Malignant neoplasm of sigmoid colon: Secondary | ICD-10-CM | POA: Diagnosis not present

## 2016-11-14 DIAGNOSIS — C7801 Secondary malignant neoplasm of right lung: Secondary | ICD-10-CM

## 2016-11-14 DIAGNOSIS — R079 Chest pain, unspecified: Secondary | ICD-10-CM

## 2016-11-14 DIAGNOSIS — C7802 Secondary malignant neoplasm of left lung: Secondary | ICD-10-CM

## 2016-11-14 DIAGNOSIS — C7800 Secondary malignant neoplasm of unspecified lung: Secondary | ICD-10-CM

## 2016-11-14 DIAGNOSIS — Z95828 Presence of other vascular implants and grafts: Secondary | ICD-10-CM

## 2016-11-14 DIAGNOSIS — C189 Malignant neoplasm of colon, unspecified: Secondary | ICD-10-CM

## 2016-11-14 LAB — COMPREHENSIVE METABOLIC PANEL
ALT: 45 U/L (ref 0–55)
AST: 27 U/L (ref 5–34)
Albumin: 3.9 g/dL (ref 3.5–5.0)
Alkaline Phosphatase: 88 U/L (ref 40–150)
Anion Gap: 9 mEq/L (ref 3–11)
BUN: 12.1 mg/dL (ref 7.0–26.0)
CHLORIDE: 105 meq/L (ref 98–109)
CO2: 25 meq/L (ref 22–29)
CREATININE: 0.8 mg/dL (ref 0.7–1.3)
Calcium: 9.3 mg/dL (ref 8.4–10.4)
GLUCOSE: 134 mg/dL (ref 70–140)
POTASSIUM: 4.2 meq/L (ref 3.5–5.1)
SODIUM: 139 meq/L (ref 136–145)
Total Bilirubin: 0.59 mg/dL (ref 0.20–1.20)
Total Protein: 7.6 g/dL (ref 6.4–8.3)

## 2016-11-14 LAB — CBC WITH DIFFERENTIAL/PLATELET
BASO%: 0.2 % (ref 0.0–2.0)
BASOS ABS: 0 10*3/uL (ref 0.0–0.1)
EOS%: 1 % (ref 0.0–7.0)
Eosinophils Absolute: 0.1 10*3/uL (ref 0.0–0.5)
HCT: 41.5 % (ref 38.4–49.9)
HEMOGLOBIN: 13.6 g/dL (ref 13.0–17.1)
LYMPH#: 1 10*3/uL (ref 0.9–3.3)
LYMPH%: 16.3 % (ref 14.0–49.0)
MCH: 28.8 pg (ref 27.2–33.4)
MCHC: 32.8 g/dL (ref 32.0–36.0)
MCV: 87.7 fL (ref 79.3–98.0)
MONO#: 0.6 10*3/uL (ref 0.1–0.9)
MONO%: 10.4 % (ref 0.0–14.0)
NEUT#: 4.5 10*3/uL (ref 1.5–6.5)
NEUT%: 72.1 % (ref 39.0–75.0)
Platelets: 202 10*3/uL (ref 140–400)
RBC: 4.73 10*6/uL (ref 4.20–5.82)
RDW: 13.9 % (ref 11.0–14.6)
WBC: 6.2 10*3/uL (ref 4.0–10.3)
nRBC: 0 % (ref 0–0)

## 2016-11-14 LAB — CEA (IN HOUSE-CHCC): CEA (CHCC-In House): 1.6 ng/mL (ref 0.00–5.00)

## 2016-11-14 MED ORDER — FLUTICASONE-SALMETEROL 250-50 MCG/DOSE IN AEPB: 1.0000 | INHALATION_SPRAY | RESPIRATORY_TRACT | 1 refills | Status: DC

## 2016-11-14 MED ORDER — TRAMADOL HCL 50 MG PO TABS: 50.0000 mg | ORAL_TABLET | Freq: Four times a day (QID) | ORAL | 0 refills | Status: DC | PRN

## 2016-11-14 MED ORDER — HEPARIN SOD (PORK) LOCK FLUSH 100 UNIT/ML IV SOLN
500.0000 [IU] | Freq: Once | INTRAVENOUS | Status: DC | PRN
  Administered 2016-11-14: 500 [IU] via INTRAVENOUS
  Filled 2016-11-14: qty 5

## 2016-11-14 MED ORDER — ALTEPLASE 2 MG IJ SOLR
2.0000 mg | Freq: Once | INTRAMUSCULAR | Status: DC | PRN
  Administered 2016-11-14: 2 mg
  Filled 2016-11-14: qty 2

## 2016-11-14 MED ORDER — SODIUM CHLORIDE 0.9% FLUSH
10.0000 mL | INTRAVENOUS | Status: DC | PRN
  Administered 2016-11-14 (×2): 10 mL via INTRAVENOUS
  Filled 2016-11-14: qty 10

## 2016-11-14 MED ORDER — FLUTICASONE-SALMETEROL 250-50 MCG/DOSE IN AEPB: 1.0000 | INHALATION_SPRAY | Freq: Two times a day (BID) | RESPIRATORY_TRACT | 1 refills | Status: DC

## 2016-11-14 NOTE — Progress Notes (Signed)
Port flushed with no blood return. [employer] rights manager called. Cathflow released to be administered by RN. Tangy Drozdowski LPN

## 2016-11-15 ENCOUNTER — Telehealth: Payer: Self-pay | Admitting: Hematology

## 2016-11-15 ENCOUNTER — Encounter: Payer: Self-pay | Admitting: Hematology

## 2016-11-15 NOTE — Telephone Encounter (Signed)
Patient bypassed scheduling on 11/14/16. Appointments scheduled per 11/14/16 los. Patient was mailed an appointment letter and schedule.

## 2016-12-26 ENCOUNTER — Ambulatory Visit (HOSPITAL_BASED_OUTPATIENT_CLINIC_OR_DEPARTMENT_OTHER): Payer: BLUE CROSS/BLUE SHIELD

## 2016-12-26 DIAGNOSIS — Z95828 Presence of other vascular implants and grafts: Secondary | ICD-10-CM

## 2016-12-26 DIAGNOSIS — C7801 Secondary malignant neoplasm of right lung: Secondary | ICD-10-CM | POA: Diagnosis not present

## 2016-12-26 DIAGNOSIS — C7802 Secondary malignant neoplasm of left lung: Secondary | ICD-10-CM

## 2016-12-26 DIAGNOSIS — C187 Malignant neoplasm of sigmoid colon: Secondary | ICD-10-CM

## 2016-12-26 DIAGNOSIS — Z452 Encounter for adjustment and management of vascular access device: Secondary | ICD-10-CM

## 2016-12-26 MED ORDER — HEPARIN SOD (PORK) LOCK FLUSH 100 UNIT/ML IV SOLN
500.0000 [IU] | Freq: Once | INTRAVENOUS | Status: AC | PRN
Start: 2016-12-26 — End: 2016-12-26
  Administered 2016-12-26: 500 [IU] via INTRAVENOUS
  Filled 2016-12-26: qty 5

## 2016-12-26 MED ORDER — SODIUM CHLORIDE 0.9% FLUSH
10.0000 mL | INTRAVENOUS | Status: DC | PRN
  Administered 2016-12-26: 10 mL via INTRAVENOUS
  Filled 2016-12-26: qty 10

## 2016-12-26 NOTE — Patient Instructions (Signed)

## 2017-01-09 ENCOUNTER — Other Ambulatory Visit: Payer: Self-pay | Admitting: *Deleted

## 2017-01-09 ENCOUNTER — Other Ambulatory Visit: Payer: Self-pay | Admitting: Hematology

## 2017-01-09 ENCOUNTER — Telehealth: Payer: Self-pay | Admitting: *Deleted

## 2017-01-09 DIAGNOSIS — C7800 Secondary malignant neoplasm of unspecified lung: Principal | ICD-10-CM

## 2017-01-09 DIAGNOSIS — C189 Malignant neoplasm of colon, unspecified: Secondary | ICD-10-CM

## 2017-01-09 MED ORDER — TRAMADOL HCL 50 MG PO TABS: 50.0000 mg | ORAL_TABLET | Freq: Four times a day (QID) | ORAL | 0 refills | Status: DC | PRN

## 2017-01-09 NOTE — Telephone Encounter (Signed)
Received vm call from pt stating that he is supposed to have a scan next month but radiology doesn't have a referral.  He would also like to have a refill on his tramadol.

## 2017-01-19 ENCOUNTER — Other Ambulatory Visit: Payer: Self-pay | Admitting: Hematology

## 2017-01-27 ENCOUNTER — Other Ambulatory Visit: Payer: Self-pay | Admitting: *Deleted

## 2017-01-27 DIAGNOSIS — C189 Malignant neoplasm of colon, unspecified: Secondary | ICD-10-CM

## 2017-01-27 DIAGNOSIS — C7800 Secondary malignant neoplasm of unspecified lung: Principal | ICD-10-CM

## 2017-01-27 MED ORDER — FLUTICASONE-SALMETEROL 250-50 MCG/DOSE IN AEPB: 1.0000 | INHALATION_SPRAY | Freq: Two times a day (BID) | RESPIRATORY_TRACT | 1 refills | Status: DC

## 2017-01-28 ENCOUNTER — Telehealth: Payer: Self-pay | Admitting: *Deleted

## 2017-01-28 ENCOUNTER — Encounter: Payer: Self-pay | Admitting: Hematology

## 2017-01-28 NOTE — Telephone Encounter (Signed)
"  Bill Wright from [facility] calling in reference to the Advair Diskus Dr. [employer] prescribed.  Insurance is very strict how this is dispensed.  Not sure why it needs authorization.  Received information that he can receive a thirty day supply.  Must refill every 90-days.  He's been out for three days now.  Please work on prior authorization quickly and call us when authorized [phone])."

## 2017-01-28 NOTE — Telephone Encounter (Signed)
Confirmed with pharmacy patient received Advair Diskus refill today.

## 2017-01-28 NOTE — Progress Notes (Signed)
Received forwarded voicemail from triage of [facility] pharmacy requesting prior auth for [place].  Called [facility](Stephanie) to obtain detailed coverage information. She provided via phone and via fax.  MI#[id number] M [facility] [phone].  Called [facility](Darren) who advised patient can only get 120 units(2 inhalers) at this time due to recent fill and the next fill could be for the full amount.  Called [facility](Michael-phamacist) to advise. He states he would put the note in for the staff to complete.

## 2017-02-10 ENCOUNTER — Other Ambulatory Visit: Payer: Self-pay | Admitting: Hematology

## 2017-02-10 ENCOUNTER — Other Ambulatory Visit (HOSPITAL_BASED_OUTPATIENT_CLINIC_OR_DEPARTMENT_OTHER): Payer: BLUE CROSS/BLUE SHIELD

## 2017-02-10 ENCOUNTER — Ambulatory Visit (HOSPITAL_BASED_OUTPATIENT_CLINIC_OR_DEPARTMENT_OTHER): Payer: BLUE CROSS/BLUE SHIELD

## 2017-02-10 ENCOUNTER — Other Ambulatory Visit: Payer: BLUE CROSS/BLUE SHIELD

## 2017-02-10 ENCOUNTER — Ambulatory Visit (HOSPITAL_COMMUNITY)
Admission: RE | Admit: 2017-02-10 | Discharge: 2017-02-10 | Disposition: A | Discharge status: Home / Self Care | Payer: BLUE CROSS/BLUE SHIELD | Source: Ambulatory Visit | Attending: Hematology | Admitting: Hematology

## 2017-02-10 DIAGNOSIS — C189 Malignant neoplasm of colon, unspecified: Secondary | ICD-10-CM

## 2017-02-10 DIAGNOSIS — I700 Atherosclerosis of aorta: Secondary | ICD-10-CM | POA: Diagnosis not present

## 2017-02-10 DIAGNOSIS — C7801 Secondary malignant neoplasm of right lung: Secondary | ICD-10-CM

## 2017-02-10 DIAGNOSIS — C7802 Secondary malignant neoplasm of left lung: Secondary | ICD-10-CM | POA: Diagnosis not present

## 2017-02-10 DIAGNOSIS — N200 Calculus of kidney: Secondary | ICD-10-CM | POA: Diagnosis not present

## 2017-02-10 DIAGNOSIS — C7800 Secondary malignant neoplasm of unspecified lung: Secondary | ICD-10-CM | POA: Insufficient documentation

## 2017-02-10 DIAGNOSIS — C187 Malignant neoplasm of sigmoid colon: Secondary | ICD-10-CM | POA: Diagnosis not present

## 2017-02-10 DIAGNOSIS — I2510 Atherosclerotic heart disease of native coronary artery without angina pectoris: Secondary | ICD-10-CM | POA: Diagnosis not present

## 2017-02-10 DIAGNOSIS — K760 Fatty (change of) liver, not elsewhere classified: Secondary | ICD-10-CM | POA: Insufficient documentation

## 2017-02-10 DIAGNOSIS — Z95828 Presence of other vascular implants and grafts: Secondary | ICD-10-CM

## 2017-02-10 LAB — CBC WITH DIFFERENTIAL/PLATELET
BASO%: 0.5 % (ref 0.0–2.0)
BASOS ABS: 0 10*3/uL (ref 0.0–0.1)
EOS ABS: 0.1 10*3/uL (ref 0.0–0.5)
EOS%: 1.8 % (ref 0.0–7.0)
HCT: 40.6 % (ref 38.4–49.9)
HGB: 13.5 g/dL (ref 13.0–17.1)
LYMPH%: 19.4 % (ref 14.0–49.0)
MCH: 28.8 pg (ref 27.2–33.4)
MCHC: 33.3 g/dL (ref 32.0–36.0)
MCV: 86.5 fL (ref 79.3–98.0)
MONO#: 0.6 10*3/uL (ref 0.1–0.9)
MONO%: 10.2 % (ref 0.0–14.0)
NEUT#: 4.3 10*3/uL (ref 1.5–6.5)
NEUT%: 68.1 % (ref 39.0–75.0)
Platelets: 318 10*3/uL (ref 140–400)
RBC: 4.69 10*6/uL (ref 4.20–5.82)
RDW: 13.9 % (ref 11.0–14.6)
WBC: 6.3 10*3/uL (ref 4.0–10.3)
lymph#: 1.2 10*3/uL (ref 0.9–3.3)

## 2017-02-10 LAB — COMPREHENSIVE METABOLIC PANEL
ALBUMIN: 3.7 g/dL (ref 3.5–5.0)
ALK PHOS: 82 U/L (ref 40–150)
ALT: 38 U/L (ref 0–55)
ANION GAP: 10 meq/L (ref 3–11)
AST: 29 U/L (ref 5–34)
BILIRUBIN TOTAL: 0.5 mg/dL (ref 0.20–1.20)
BUN: 14.1 mg/dL (ref 7.0–26.0)
CO2: 26 mEq/L (ref 22–29)
CREATININE: 0.9 mg/dL (ref 0.7–1.3)
Calcium: 9.5 mg/dL (ref 8.4–10.4)
Chloride: 104 mEq/L (ref 98–109)
GLUCOSE: 94 mg/dL (ref 70–140)
Potassium: 4 mEq/L (ref 3.5–5.1)
Sodium: 139 mEq/L (ref 136–145)
TOTAL PROTEIN: 7.8 g/dL (ref 6.4–8.3)

## 2017-02-10 LAB — CEA (IN HOUSE-CHCC): CEA (CHCC-IN HOUSE): 5.94 ng/mL — AB (ref 0.00–5.00)

## 2017-02-10 MED ORDER — IOPAMIDOL (ISOVUE-300) INJECTION 61%
75.0000 mL | Freq: Once | INTRAVENOUS | Status: AC | PRN
  Administered 2017-02-10: 75 mL via INTRAVENOUS

## 2017-02-10 MED ORDER — SODIUM CHLORIDE 0.9 % IV SOLN
Freq: Once | INTRAVENOUS | Status: DC
  Filled 2017-02-10: qty 0.5

## 2017-02-10 MED ORDER — SODIUM CHLORIDE 0.9% FLUSH
10.0000 mL | INTRAVENOUS | Status: DC | PRN
  Administered 2017-02-10: 10 mL via INTRAVENOUS
  Filled 2017-02-10: qty 10

## 2017-02-10 MED ORDER — HEPARIN SOD (PORK) LOCK FLUSH 100 UNIT/ML IV SOLN
INTRAVENOUS | Status: AC
Start: 2017-02-10 — End: 2017-02-10
  Administered 2017-02-10: 500 [IU]
  Filled 2017-02-10: qty 5

## 2017-02-10 MED ORDER — IOPAMIDOL (ISOVUE-300) INJECTION 61%
INTRAVENOUS | Status: AC
  Filled 2017-02-10: qty 75

## 2017-02-10 NOTE — Progress Notes (Signed)
Port flushed several times with no blood return. Labs drawn peripherally. Pt was in rush to get to CT and didn't want to receive CATHFLOW. Porsche Cates LPN

## 2017-02-13 NOTE — Progress Notes (Signed)
[place]  [phone] [phone]  Clinic Follow Up Note    02/14/2017  CHIEF COMPLAINTS:  Follow Up colon cancer  Oncology History   T2, Colon cancer metastasized to lung [facility])   Staging form: Colon and Rectum, AJCC 7th Edition     Clinical stage from 03/30/2015: Stage Unknown ([place], N1, M1) - Unsigned       Colon cancer metastasized to lung [facility]) s/p laparoscopic assisted sigmoid colectomy 11/02/15   03/30/2015 Miscellaneous    Foundation one genomic testing showed TP53 mutation, MSI stable, low tumor mutation burden. Negative for K-ras, NRAS and BRAF      03/30/2015 Initial Biopsy    Sigmoid mass biopsy showed invasive adenocarcinoma. Cecal colon polyps showed tubular adenoma.      03/30/2015 Initial Diagnosis    Colon cancer      03/30/2015 Procedure    colonoscopy by Dr. Michail Sermon showed a fungating, infiltrative and ulcerated nonobstructing large mass in the sigmoid colon and at 20 cm proximal to the anus. The mass was partially circumferential no bleeding. A 10 mm polyps in the cecum was removed.      04/03/2015 Imaging    CT chest, abdomen and pelvis with contrast showed nodular masslike area of clinical worsening at rectosigmoid junction, tiny pericolonic lymph nodes, bilateral pulmonary nodules measuring about 1 cm.      04/12/2015 PET scan    Hypermetabolic colonic mass near rectosigmoid junction, tiny subcentimeter paracolonic lymph nodes. Bilateral pulmonary metastasis.      04/19/2015 Procedure    CT-guided lung nodule biopsy attempted, unsuccessful.      04/27/2015 - 09/14/2015 Chemotherapy    Oxaliplatin 130 mg/m on day 1, Capecitabine 2373m q12hr, 2 weeks on and one week off (only received 7 days for first cycle), oxaliplatin held on cycle 5 and dose reduced to 1047mm2, capecitabine reduced to 200073m12h D1-14, stopped per pt's request.       06/29/2015 - 10/19/2015 Chemotherapy     Panitumumab every 2 weeks, some  cycles were postponed due to pt's request, stoppe per pt's request       09/18/2015 Imaging    Pulmonary nodules are less hypermetabolic, stable size. Stable hypermetabolic portal hepatis and abdominal peritoneal ligament lymph nodes. No other new lesions.       11/02/2015 Surgery    sigmoid colon segmental resection       11/02/2015 Pathology Results    Sigmoid colon segmental resection showed adenocarcinoma, grade 3,  T2, 3 out of 13 lymph nodes were positive, surgical margins were negative. LVI(-), perineural invasion negative      11/13/2015 Imaging    CT chest, abdomen and pelvis with contrast showed postsurgical changes, mild progression of pulmonary metastasis, measuring up to 15 mm in the right lower lobe.      11/29/2015 - 12/05/2015 Radiation Therapy    SBRT to 4 lung lesions in 3 sessions       04/25/2016 - 08/05/2016 Chemotherapy    FOLFIRI every 2 weeks, and Avastin added from cycle 3, 5-FU held since cycle 2 due to poor tolerance. Chemo was stopped after 3 months due to overall poor tolerance and pt's request to stop.       06/27/2016 Imaging    CT CAP 06/27/16 IMPRESSION: Status post partial left hemicolectomy. Progressive wall thickening involving the colon near the suture line, worrisome for residual tumor. Adjacent 7 mm short axis pericolonic lymph node, possibly reflecting a nodal metastasis. Radiation changes in the posterior  right upper lobe and superior segment right lower lobe. Progressive pulmonary metastases bilaterally, measuring up to 13 mm, as above.      09/17/2016 Imaging    CT A/P/C w contrast  IMPRESSION: 1. Multifocal pulmonary nodules are not significantly changed in size compared with the previous exam. 2. Stable appearance of radiation changes within the right midlung. 3. Stable to scratch set improved appearance of wall thickening involving the colon at the level of the suture line.       11/11/2016 Imaging    CT Chest WO Contrast  11/11/16: IMPRESSION: 1. Minimal enlargement of some pulmonary metastases. 2. Coronary artery calcification. 3. Hepatic steatosis.       02/10/2017 Imaging    CT CAP W Contrast 02/10/17 IMPRESSION: 1. Progressive metastatic disease to the thorax as demonstrated by increased size of numerous previously noted pulmonary nodules, what appears to be lymphangitic spread of disease developing in the right upper lobe and superior segment of the right lower lobe, and new 1.5 cm short axis prevascular lymph node. 2. No definite findings of metastatic disease in the abdomen or pelvis. 3. Multiple tiny nonobstructive calculi in the collecting systems of the kidneys bilaterally measuring 2-3 mm in size. No ureteral stones or findings of urinary tract obstruction are noted at this time. 4. Hepatic steatosis. 5. Aortic atherosclerosis, in addition to left anterior descending coronary artery disease. Please note that although the presence of coronary artery calcium documents the presence of coronary artery disease, the severity of this disease and any potential stenosis cannot be assessed on this non-gated CT examination. Assessment for potential risk factor modification, dietary therapy or pharmacologic therapy may be warranted, if clinically indicated. 6. There are calcifications of the aortic valve. Echocardiographic correlation for evaluation of potential valvular dysfunction may be warranted if clinically indicated.        Chemotherapy    Third-line chemotherapy Xeloda for 2 weeks on and 1 week off with Oxaliplatin every 3 weeks and Panitumumab every 2 weeks. We will start Xeloda and Oxaliplatin on 02/27/17 with antibody starting on the 8/27 or 8/28.         HISTORY OF PRESENTING ILLNESS:  Bill Wright 56 y.o. male is here because of recently newly diagnosed colon cancer.  He has had intermittent bloody stool for 2 years, it has been mild, mixed with stool, patient does not have any  abdominal pain, constipation, change of his bowel habits, nausea, weight loss or other symptoms. He did not seek medical attention for this. He went to emergency room on 03/15/2015 for right flank pain, due to his kidney stone. CT scan incidentally found a 11 mm right lower lobe nodule and mucosal edema in the sigmoid colon. He saw his primary care physician, and was referred to GI Dr. Michail Sermon here at he underwent colonoscopy on 03/30/2015, which showed a fungating infiltrative and ulcerated nonobstructing large mass in the sigmoid colon, biopsy showed adenocarcinoma. CT chest abdomen and pelvis showed multiple lung nodules measuring about 1 cm. He was referred to surgeon Dr. Zella Richer, who referred patient to [place] for further workup of his lung nodule and discuss chemotherapy.  He feels very well overall, denies any symptoms. 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 He never had screening colonoscopy prior the reason one, no significant past medical history, does not see doctors regularly.   CURRENT TREATMENT:  Pending second-line chemotherapy: Xeloda for 2 weeks on and 1 week off with Oxaliplatin every  3 weeks and Panitumumab every 2 weeks. We will start 02/27/17 with antibody starting on the 8/27 or 8/28.   INTERIM HISTORY  Bill Wright returns for follow-up. He presents to the clinic today with his wife. He reports he has been having cold symptoms of a cough that started a couple of weeks ago. He fesl wheezy. He got out of work for a week. His wife says he was week and taking Daytime Nyquil and tylenol and sore throat cough. The cough went through the whole family. She thinks it could have been bronchitis or pneumonia. She wanted him to call a doctor.  Th cough is now resolved but feels slightly wheezy.  His appetite is fine and he denies stomach issues but he gets bilateral lateral abdominal muscle cramps.denies fever.     MEDICAL HISTORY:  Past Medical History:   Diagnosis Date  . Anxiety    situational due to cancer diagnosis  . Cancer [facility]) 2017   colon-chemo 09/22/15 now surgery  . Hypercholesteremia   . Kidney calculi     SURGICAL HISTORY: Past Surgical History:  Procedure Laterality Date  . COLONOSCOPY  03/30/15  . LAPAROSCOPIC PARTIAL COLECTOMY N/A 11/02/2015   Procedure: LAPAROSCOPIC ASSISTED SIGMOID COLECTOMY;  Surgeon: Jackolyn Confer, MD;  Location: [facility];  Service: General;  Laterality: N/A;  . PORTACATH PLACEMENT N/A 05/08/2016   Procedure: INSERTION PORT-A-CATH;  Surgeon: Jackolyn Confer, MD;  Location: [facility];  Service: General;  Laterality: N/A;    SOCIAL HISTORY: Social History   Social History  . Marital Status: Married    Spouse Name: N/A  . Number of Children: 3, age of 30, 81 and 20   . Years of Education: N/A   Occupational History  . Banker for [employer] [place]    Social History Main Topics  . Smoking status: Never Smoker   . Smokeless tobacco: Not on file  . Alcohol Use: No  . Drug Use: No  . Sexual Activity: Not on file   Other Topics Concern  . Not on file   Social History Narrative    FAMILY HISTORY: Family History  Problem Relation Age of Onset  . Breast cancer Mother 109       +rad and lymph node  . Diabetes Maternal Grandmother        leading to blindness  . Obesity Maternal Aunt   . Stroke Maternal Uncle 65    ALLERGIES:  has No Known Allergies.  MEDICATIONS:  Current Outpatient Prescriptions  Medication Sig Dispense Refill  . Fluticasone-Salmeterol (ADVAIR DISKUS) 250-50 MCG/DOSE AEPB Inhale 1 puff into the lungs 2 (two) times daily. 180 each 1  . ibuprofen (ADVIL,MOTRIN) 200 MG tablet Take 600 mg by mouth every 6 (six) hours as needed.    . sildenafil (VIAGRA) 100 MG tablet   5  . traMADol (ULTRAM) 50 MG tablet Take 1 tablet (50 mg total) by mouth every 6 (six) hours as needed. 20 tablet 0  . ALPRAZolam (XANAX) 0.25 MG tablet Take 1 tablet (0.25 mg total) by mouth 3 (three) times  daily as needed for anxiety. (Patient not taking: Reported on 09/19/2016) 30 tablet 0  . clindamycin (CLINDAGEL) 1 % gel Apply topically 2 (two) times daily. 30 g 2  . cyclobenzaprine (FLEXERIL) 5 MG tablet Take 1 tablet (5 mg total) by mouth 3 (three) times daily as needed for muscle spasms. (Patient not taking: Reported on 09/19/2016) 30 tablet 0  . HYDROcodone-acetaminophen (NORCO/VICODIN) 5-325 MG tablet Take 1 tablet by  mouth every 6 (six) hours as needed for moderate pain or severe pain. 20 tablet 0  . lidocaine-prilocaine (EMLA) cream Apply 1 application topically as needed. (Patient not taking: Reported on 02/14/2017) 30 g 1  . ondansetron (ZOFRAN) 8 MG tablet Take 1 tablet (8 mg total) by mouth 2 (two) times daily as needed for refractory nausea / vomiting. Start on day 3 after chemotherapy. 30 tablet 2  . prochlorperazine (COMPAZINE) 10 MG tablet Take 1 tablet (10 mg total) by mouth every 6 (six) hours as needed (NAUSEA). 30 tablet 2   No current facility-administered medications for this visit.     REVIEW OF SYSTEMS:  Constitutional: Denies fevers, chills or abnormal night sweats, no weight loss. Eyes: Denies blurriness of vision, double vision or watery eyes Ears, nose, mouth, throat, and face: Denies mucositis or sore throat Respiratory: Denies dyspnea or wheezes  (+) "feels wheezy" Cardiovascular: Denies palpitation, chest discomfort or lower extremity swelling Gastrointestinal:  Denies heartburn or change in bowel habits (+) nausea, vomiting Skin: Denies abnormal skin rashes Lymphatics: Denies new lymphadenopathy or easy bruising Neurological:Denies numbness, tingling or new weaknesses MSK: (+) bilateral lateral abdominal muscle cramps Behavioral/Psych: Mood is stable, no new changes  All other systems were reviewed with the patient and are negative.  PHYSICAL EXAMINATION: ECOG PERFORMANCE STATUS: 0  BP 125/72 (BP Location: Right Arm, Patient Position: Sitting)   Pulse (!)  105 Comment: RN [name] aware of pulse  Temp 98.8 F (37.1 C) (Oral)   Resp 17   Ht _0  (1.676 m)   Wt 222 lb 8 oz (100.9 kg)   SpO2 100%   BMI 35.91 kg/m    GENERAL:alert, no distress and comfortable SKIN: skin color, texture, turgor are normal. No rash noted on the face. EYES: normal, conjunctiva are pink and non-injected, sclera clear OROPHARYNX:no exudate, no erythema and lips, buccal mucosa, and tongue normal  NECK: supple, thyroid normal size, non-tender, without nodularity LYMPH:  no palpable lymphadenopathy in the cervical, axillary or inguinal LUNGS: clear to auscultation and percussion with normal breathing effort HEART: regular rate & rhythm and no murmurs and no lower extremity edema ABDOMEN:abdomen soft, non-tender and normal bowel sounds. The midline incision has healed well, no discharge or skin erythema  Musculoskeletal:no cyanosis of digits and no clubbing  PSYCH: alert & oriented x 3 with fluent speech NEURO: no focal motor/sensory deficits  LABORATORY DATA:  I have reviewed the data as listed CBC Latest Ref Rng & Units 02/10/2017 11/14/2016 09/17/2016  WBC 4.0 - 10.3 10e3/uL 6.3 6.2 6.4  Hemoglobin 13.0 - 17.1 g/dL 13.5 13.6 13.4  Hematocrit 38.4 - 49.9 % 40.6 41.5 40.3  Platelets 140 - 400 10e3/uL 318 202 201   CMP Latest Ref Rng & Units 02/10/2017 11/14/2016 09/17/2016  Glucose 70 - 140 mg/dl 94 134 98  BUN 7.0 - 26.0 mg/dL 14.1 12.1 12.8  Creatinine 0.7 - 1.3 mg/dL 0.9 0.8 0.9  Sodium 136 - 145 mEq/L 139 139 141  Potassium 3.5 - 5.1 mEq/L 4.0 4.2 4.2  Chloride 101 - 111 mmol/L - - -  CO2 22 - 29 mEq/L _1 Calcium 8.4 - 10.4 mg/dL 9.5 9.3 9.4  Total Protein 6.4 - 8.3 g/dL 7.8 7.6 7.6  Total Bilirubin 0.20 - 1.20 mg/dL 0.50 0.59 0.42  Alkaline Phos 40 - 150 U/L 82 88 82  AST 5 - 34 U/L _2 ALT 0 - 55 U/L 38 45 42   CEA:  04/20/2016: <0.5 06/08/2015: <0.5 09/07/2015: <0.5 11/15/2015: 0.9 02/16/2016: <1.0  04/25/2016: 1.6 05/23/16:  <1.00 06/20/2016: 1.05 07/22/2016: <1.00 09/17/16: <1.00 11/14/16: 1.60 02/10/17: 5.94  PATHOLOGY REPORT  Diagnosis 11/02/2015 1. Colon, segmental resection for tumor, sigmoid ADENOCARCINOMA OF THE SIGMOID COLON (2.0 CM), GRADE 3 THE TUMOR INVADES MUSCULARIS PROPRIA MARGINS OF RESECTION ARE NEGATIVE METASTATIC ADENOCARCINOMA IN THREE OF THIRTEEN LYMPH NODES (3/13) 2. Colon, resection margin (donut), distal sigmoid BENIGN COLONIC TISSUE Microscopic Comment 1. COLON AND RECTUM (INCLUDING TRANS-ANAL RESECTION): Specimen: Sigmoid Procedure: Segmental resection Tumor site: sigmoid Specimen integrity: Intact Macroscopic intactness of mesorectum: Not applicable: x Complete: NA Near complete: NA Incomplete: NA Cannot be determined (specify): NA Macroscopic tumor perforation: Muscularis Invasive tumor: Maximum size: 2.0 cm Histologic type(s): Adenocarcinoma Histologic grade and differentiation: G3 G1: well differentiated/low grade G2: moderately differentiated/low grade G3: poorly differentiated/high grade G4: undifferentiated/high grade Type of polyp in which invasive carcinoma arose: Tubular adenoma Microscopic extension of invasive tumor: Muscularis propria Lymph-Vascular invasion: Negative Peri-neural invasion: Negative Tumor deposit(s) (discontinuous extramural extension): Negative Resection margins: Proximal margin: Negative Distal margin: Negative Circumferential (radial) (posterior ascending, posterior descending; lateral and posterior mid-rectum; and entire lower 1/3 rectum):Negative Mesenteric margin (sigmoid and transverse): Negative Distance closest margin (if all above margins negative): 3.5 cm Trans-anal resection margins only: Deep margin: NA Mucosal Margin: NA Distance closest mucosal margin (if negative): NA Treatment effect (neo-adjuvant therapy): Partial Additional polyp(s): None Non-neoplastic findings: unremarkable Lymph nodes: number examined 13; number  positive: 3 Pathologic Staging: T2, N1b, M1a     RADIOGRAPHIC STUDIES: I have personally reviewed the radiological images as listed and agreed with the findings in the report.  PET 04/12/2015 IMPRESSION: Hypermetabolic colonic mass near rectosigmoid junction, consistent with known primary colon carcinoma. Tiny sub-cm pericolonic lymph nodes in sigmoid mesocolon are too small to characterize by PET, but are suspicious for early lymph node metastases. Bilateral pulmonary metastases  PET 09/18/2015 IMPRESSION: 1. Hypermetabolic pulmonary metastases have enlarged slightly from 07/18/2015. 2. Hypermetabolic porta hepatis/abdominal peritoneal ligament lymph nodes, stable from 04/12/2015. 3. Increase in hypermetabolism associated with mesenteric haziness and nodularity. While a reactive phenomenon/panniculitis can create this in appearance, metastatic disease/lymphoproliferative disorder cannot be excluded. 4. Hepatic steatosis. 5. Bilateral nephrolithiasis.  CT chest, abdomen and pelvis with contrast 06/27/2016 IMPRESSION: Status post partial left hemicolectomy. Progressive wall thickening involving the colon near the suture line, worrisome for residual tumor. Adjacent 7 mm short axis pericolonic lymph node, possibly reflecting a nodal metastasis. Radiation changes in the posterior right upper lobe and superior segment right lower lobe. Progressive pulmonary metastases bilaterally, measuring up to 13 mm, as above.   CT A/P/C w contrast 09/17/16:  IMPRESSION: 1. Multifocal pulmonary nodules are not significantly changed in size compared with the previous exam. 2. Stable appearance of radiation changes within the right midlung. 3. Stable to scratch set improved appearance of wall thickening involving the colon at the level of the suture line.   CT Chest WO Contrast 11/11/16: IMPRESSION: 1. Minimal enlargement of some pulmonary metastases. 2. Coronary artery  calcification. 3. Hepatic steatosis.   CT CAP W Contrast 02/10/17 IMPRESSION: 1. Progressive metastatic disease to the thorax as demonstrated by increased size of numerous previously noted pulmonary nodules, what appears to be lymphangitic spread of disease developing in the right upper lobe and superior segment of the right lower lobe, and new 1.5 cm short axis prevascular lymph node. 2. No definite findings of metastatic disease in the abdomen or pelvis. 3. Multiple tiny nonobstructive calculi in the collecting  systems of the kidneys bilaterally measuring 2-3 mm in size. No ureteral stones or findings of urinary tract obstruction are noted at this time. 4. Hepatic steatosis. 5. Aortic atherosclerosis, in addition to left anterior descending coronary artery disease. Please note that although the presence of coronary artery calcium documents the presence of coronary artery disease, the severity of this disease and any potential stenosis cannot be assessed on this non-gated CT examination. Assessment for potential risk factor modification, dietary therapy or pharmacologic therapy may be warranted, if clinically indicated. 6. There are calcifications of the aortic valve. Echocardiographic correlation for evaluation of potential valvular dysfunction may be warranted if clinically indicated.   ASSESSMENT & PLAN:  56 y.o. male, without significant past medical history except kidney stone, presented with intermittent bloody stool for 2 years, and colonoscopy showed a large sigmoid colon mass, CT scan showed multiple (at least 4) nodules in bilateral lungs, measuring about 1 cm.  1. Sigmoid colon adenocarcinoma, pT2N1bM1, stage IV with lung mets, KRA/NRAS wild type, MSI-stable -I previously reviewed his colonoscopy, initial CT scan findings and the biopsy results in great details with patient and his wife. -he received first line chemo FOLFOX and panitumumab, followed by hemicolectomy, SBRT  to 4 lung nodules, and second line chemo irinotecan and avastin, chemotherapy was finally stopped due to his poor tolerance. -I previously reviewed his surgical pathology findings, which showed a residual T2 primary tumor, 3 lymph nodes positive, grade 3 disease, certainly high risk disease.  -We discussed that his disease is likely incurable, and the goal of therapy is maximum disease control and prolong his life.  -- His tumor does not contain KRAS/NRAS or BRAF mutation, so he would benefit from EGFR antibody, Panitumumab was added on from cycle 4 but tolerated poorly dur to skin rash  -He is off chemotherapy now, clinically doing well, with mild dry cough and dyspnea, otherwise asymptomatic. -I reviewed his 02/10/17 restaging CT chest, which showed significant increase the size of multiple lung nodules, and possible lymphogenic spread of disease in the right upper lobe and superior segment of right lower lobe.   -I strongly recommend him to resume chemotherapy due to his disease progression, he is also moderately symptomatically. Patient has been previously resistant to chemotherapy previously due to poor tolerance. After lengthy discussion, patient agreed to restart chemotherapy. -We discussed his current chemo therapy options. He previously received CAPOX and FOLFIRI, no disease progression on chemo. Based on side effects Pt prefers to resume CAPOX every 3 weeks and Panitumumab every 2 weeks, which was his first line chemo. Due to his tolerance issue, will slightly decrease his dose.  -We will start 02/27/17 with antibody on the 8/27 or 8/28. Will start low dose for first cycle. I again discussed the side effects of this treatment.  -Will repeat CT every 2-3 months -Will f/u on 2nd cycle    2. Dry cough and dyspnea  -His initial cough is likely related to his SBRT,he received a course of prednisone  -I'm concerned he is dry cough to be related to his cancer progression in the lung  -previously Much  improved since he started chemotherapy -Based on Pt discussion of symptoms he had possible URI that is now resolved. I strongly encouraged him to contact us when he gets any symptoms  -His mild dyspnea is probably related to the lung metastasis, especially lymphogenic spread in right lung.    3. Obesity  -I again encouraged him to eat healthy and exercise   4.  Goal of care discussion  -We again discussed the incurable nature of his cancer, although his cancer seems to be more indolent -The patient understands the goal of care is palliative. -He is full code now   5. Pain medication -He has been taking Oxycodone once a day as needed due to his high intensity job and some chest discomfort which could be related to stress.  -He really does not have much cancer related pain. We discussed narcotic addiction, I recommend him to gradually wean off oxycodone. He tried but not successful  -he is starting chemo again, I refilled his Norco today    PLAN -Order Xeloda today  -Refill hydrocodone, zofran, compazine and clindamycin gel  today  -Lab, flush and chemo oxaliplatin on 8/23 and 9/13 -Panitumumab on 8/27 or 8/28 and 2 weeks after -f/u on 9/13   I spent 30 minutes counseling the patient face to face. The total time spent in the appointment was 40 minutes and more than 50% was on counseling.  This document serves as a record of services personally performed by Truitt Merle, MD. It was created on her behalf by Joslyn Devon, a trained medical scribe. The creation of this record is based on the scribe's personal observations and the provider's statements to them. This document has been checked and approved by the attending provider.   I have reviewed the above documentation for accuracy and completeness and I agree with the above.   Truitt Merle, MD 02/14/2017

## 2017-02-14 ENCOUNTER — Ambulatory Visit (HOSPITAL_BASED_OUTPATIENT_CLINIC_OR_DEPARTMENT_OTHER): Payer: BLUE CROSS/BLUE SHIELD | Admitting: Hematology

## 2017-02-14 ENCOUNTER — Encounter: Payer: Self-pay | Admitting: Hematology

## 2017-02-14 ENCOUNTER — Telehealth: Payer: Self-pay | Admitting: *Deleted

## 2017-02-14 ENCOUNTER — Telehealth: Payer: Self-pay | Admitting: Hematology

## 2017-02-14 VITALS — BP 125/72 | HR 105 | Temp 98.8°F | Resp 17 | Ht 66.0 in | Wt 222.5 lb

## 2017-02-14 DIAGNOSIS — C189 Malignant neoplasm of colon, unspecified: Secondary | ICD-10-CM

## 2017-02-14 DIAGNOSIS — C7801 Secondary malignant neoplasm of right lung: Secondary | ICD-10-CM

## 2017-02-14 DIAGNOSIS — R0600 Dyspnea, unspecified: Secondary | ICD-10-CM

## 2017-02-14 DIAGNOSIS — E669 Obesity, unspecified: Secondary | ICD-10-CM | POA: Diagnosis not present

## 2017-02-14 DIAGNOSIS — C187 Malignant neoplasm of sigmoid colon: Secondary | ICD-10-CM | POA: Diagnosis not present

## 2017-02-14 DIAGNOSIS — R05 Cough: Secondary | ICD-10-CM

## 2017-02-14 DIAGNOSIS — C7800 Secondary malignant neoplasm of unspecified lung: Principal | ICD-10-CM

## 2017-02-14 DIAGNOSIS — C7802 Secondary malignant neoplasm of left lung: Secondary | ICD-10-CM

## 2017-02-14 MED ORDER — CLINDAMYCIN PHOSPHATE 1 % EX GEL: Freq: Two times a day (BID) | CUTANEOUS | 2 refills | Status: DC

## 2017-02-14 MED ORDER — ONDANSETRON HCL 8 MG PO TABS: 8.0000 mg | ORAL_TABLET | Freq: Two times a day (BID) | ORAL | 2 refills | Status: DC | PRN

## 2017-02-14 MED ORDER — PROCHLORPERAZINE MALEATE 10 MG PO TABS: 10.0000 mg | ORAL_TABLET | Freq: Four times a day (QID) | ORAL | 2 refills | Status: DC | PRN

## 2017-02-14 MED ORDER — CAPECITABINE 500 MG PO TABS: 1000.0000 mg/m2 | ORAL_TABLET | Freq: Two times a day (BID) | ORAL | 2 refills | Status: DC

## 2017-02-14 MED ORDER — HYDROCODONE-ACETAMINOPHEN 5-325 MG PO TABS: 1.0000 | ORAL_TABLET | Freq: Four times a day (QID) | ORAL | 0 refills | Status: DC | PRN

## 2017-02-14 NOTE — Progress Notes (Signed)
START ON PATHWAY REGIMEN - Colorectal     A cycle is every 21 days.:     Panitumumab      Oxaliplatin      Capecitabine   **Always confirm dose/schedule in your pharmacy ordering system**    Patient Characteristics: Metastatic Colorectal, First Line, Nonsurgical Candidate, KRAS/NRAS Wild-Type, BRAF Wild-Type/Unknown, PS = 0,1; Bevacizumab Ineligible Current evidence of distant metastases? Yes AJCC T Category: T2 AJCC N Category: N1b AJCC M Category: M1a AJCC 8 Stage Grouping: IVA BRAF Mutation Status: Wild Type (no mutation) KRAS/NRAS Mutation Status: Wild Type (no mutation) Line of therapy: First Line Would you be surprised if this patient died  in the next year? I would be surprised if this patient died in the next year Performance Status: PS = 0, 1 Bevacizumab Eligibility: Ineligible Intent of Therapy: Non-Curative / Palliative Intent, Discussed with Patient

## 2017-02-14 NOTE — Telephone Encounter (Signed)
Scheduled appt per 8/10 los - Gave patient AVS and calender per los.  

## 2017-02-14 NOTE — Telephone Encounter (Signed)
Sent xeloda script to [facility] @ [phone] since this is where he had it filled before.  Checked with Jessie/Pharm.

## 2017-02-24 ENCOUNTER — Telehealth: Payer: Self-pay

## 2017-02-24 NOTE — Telephone Encounter (Signed)
[facility] called to clarify weeks off on xeloda. Per OV note 8/10 instructed pharmacist he is 2 weeks on and 1 week off.   [facility] [phone]

## 2017-02-27 ENCOUNTER — Ambulatory Visit: Payer: BLUE CROSS/BLUE SHIELD

## 2017-02-27 ENCOUNTER — Other Ambulatory Visit (HOSPITAL_BASED_OUTPATIENT_CLINIC_OR_DEPARTMENT_OTHER): Payer: BLUE CROSS/BLUE SHIELD

## 2017-02-27 ENCOUNTER — Ambulatory Visit (HOSPITAL_BASED_OUTPATIENT_CLINIC_OR_DEPARTMENT_OTHER): Payer: BLUE CROSS/BLUE SHIELD

## 2017-02-27 VITALS — BP 141/78 | HR 99 | Temp 98.8°F | Resp 17

## 2017-02-27 DIAGNOSIS — C7802 Secondary malignant neoplasm of left lung: Secondary | ICD-10-CM | POA: Diagnosis not present

## 2017-02-27 DIAGNOSIS — Z95828 Presence of other vascular implants and grafts: Secondary | ICD-10-CM

## 2017-02-27 DIAGNOSIS — C7801 Secondary malignant neoplasm of right lung: Secondary | ICD-10-CM | POA: Diagnosis not present

## 2017-02-27 DIAGNOSIS — C189 Malignant neoplasm of colon, unspecified: Secondary | ICD-10-CM

## 2017-02-27 DIAGNOSIS — C187 Malignant neoplasm of sigmoid colon: Secondary | ICD-10-CM | POA: Diagnosis not present

## 2017-02-27 DIAGNOSIS — Z5111 Encounter for antineoplastic chemotherapy: Secondary | ICD-10-CM

## 2017-02-27 DIAGNOSIS — C7800 Secondary malignant neoplasm of unspecified lung: Principal | ICD-10-CM

## 2017-02-27 LAB — COMPREHENSIVE METABOLIC PANEL
ALT: 32 U/L (ref 0–55)
ANION GAP: 6 meq/L (ref 3–11)
AST: 21 U/L (ref 5–34)
Albumin: 3.5 g/dL (ref 3.5–5.0)
Alkaline Phosphatase: 84 U/L (ref 40–150)
BUN: 11.9 mg/dL (ref 7.0–26.0)
CALCIUM: 9.3 mg/dL (ref 8.4–10.4)
CHLORIDE: 104 meq/L (ref 98–109)
CO2: 29 mEq/L (ref 22–29)
Creatinine: 1 mg/dL (ref 0.7–1.3)
EGFR: 81 mL/min/{1.73_m2} — ABNORMAL LOW (ref >90)
Glucose: 120 mg/dl (ref 70–140)
Potassium: 4.1 mEq/L (ref 3.5–5.1)
Sodium: 139 mEq/L (ref 136–145)
Total Bilirubin: 0.4 mg/dL (ref 0.20–1.20)
Total Protein: 7.3 g/dL (ref 6.4–8.3)

## 2017-02-27 LAB — CBC WITH DIFFERENTIAL/PLATELET
BASO%: 0.7 % (ref 0.0–2.0)
BASOS ABS: 0.1 10*3/uL (ref 0.0–0.1)
EOS%: 1.3 % (ref 0.0–7.0)
Eosinophils Absolute: 0.1 10*3/uL (ref 0.0–0.5)
HEMATOCRIT: 39.7 % (ref 38.4–49.9)
HGB: 13.4 g/dL (ref 13.0–17.1)
LYMPH#: 1.2 10*3/uL (ref 0.9–3.3)
LYMPH%: 16.2 % (ref 14.0–49.0)
MCH: 29 pg (ref 27.2–33.4)
MCHC: 33.7 g/dL (ref 32.0–36.0)
MCV: 85.9 fL (ref 79.3–98.0)
MONO#: 0.7 10*3/uL (ref 0.1–0.9)
MONO%: 10.1 % (ref 0.0–14.0)
NEUT#: 5.3 10*3/uL (ref 1.5–6.5)
NEUT%: 71.7 % (ref 39.0–75.0)
PLATELETS: 199 10*3/uL (ref 140–400)
RBC: 4.62 10*6/uL (ref 4.20–5.82)
RDW: 14.5 % (ref 11.0–14.6)
WBC: 7.4 10*3/uL (ref 4.0–10.3)

## 2017-02-27 MED ORDER — SODIUM CHLORIDE 0.9% FLUSH
10.0000 mL | INTRAVENOUS | Status: DC | PRN
  Administered 2017-02-27: 10 mL
  Filled 2017-02-27: qty 10

## 2017-02-27 MED ORDER — PALONOSETRON HCL INJECTION 0.25 MG/5ML
0.2500 mg | Freq: Once | INTRAVENOUS | Status: AC
  Administered 2017-02-27: 0.25 mg via INTRAVENOUS

## 2017-02-27 MED ORDER — DEXAMETHASONE SODIUM PHOSPHATE 10 MG/ML IJ SOLN
4.0000 mg | Freq: Once | INTRAMUSCULAR | Status: AC
  Administered 2017-02-27: 4 mg via INTRAVENOUS

## 2017-02-27 MED ORDER — SODIUM CHLORIDE 0.9% FLUSH
10.0000 mL | INTRAVENOUS | Status: DC | PRN
  Administered 2017-02-27: 10 mL via INTRAVENOUS
  Filled 2017-02-27: qty 10

## 2017-02-27 MED ORDER — PALONOSETRON HCL INJECTION 0.25 MG/5ML
INTRAVENOUS | Status: AC
  Filled 2017-02-27: qty 5

## 2017-02-27 MED ORDER — SODIUM CHLORIDE 0.9 % IV SOLN: 4.0000 mg | Freq: Once | INTRAVENOUS | Status: DC

## 2017-02-27 MED ORDER — OXALIPLATIN CHEMO INJECTION 100 MG/20ML
92.0000 mg/m2 | Freq: Once | INTRAVENOUS | Status: AC
  Administered 2017-02-27: 200 mg via INTRAVENOUS
  Filled 2017-02-27: qty 40

## 2017-02-27 MED ORDER — HEPARIN SOD (PORK) LOCK FLUSH 100 UNIT/ML IV SOLN
500.0000 [IU] | Freq: Once | INTRAVENOUS | Status: AC | PRN
  Administered 2017-02-27: 500 [IU]
  Filled 2017-02-27: qty 5

## 2017-02-27 MED ORDER — DEXAMETHASONE SODIUM PHOSPHATE 10 MG/ML IJ SOLN
INTRAMUSCULAR | Status: AC
  Filled 2017-02-27: qty 1

## 2017-02-27 MED ORDER — DEXTROSE 5 % IV SOLN
Freq: Once | INTRAVENOUS | Status: AC
  Administered 2017-02-27: 15:00:00 via INTRAVENOUS

## 2017-02-27 NOTE — Patient Instructions (Signed)
[facility] Discharge Instructions for Patients Receiving Chemotherapy  Today you received the following chemotherapy agents Oxaliplatin.  To help prevent nausea and vomiting after your treatment, we encourage you to take your nausea medication as directed BUT NO ZOFRAN FOR 3 DAYS, TAKE COMPAZINE DURING THAT TIME.   If you develop nausea and vomiting that is not controlled by your nausea medication, call the clinic.   BELOW ARE SYMPTOMS THAT SHOULD BE REPORTED IMMEDIATELY:  *FEVER GREATER THAN 100.5 F  *CHILLS WITH OR WITHOUT FEVER  NAUSEA AND VOMITING THAT IS NOT CONTROLLED WITH YOUR NAUSEA MEDICATION  *UNUSUAL SHORTNESS OF BREATH  *UNUSUAL BRUISING OR BLEEDING  TENDERNESS IN MOUTH AND THROAT WITH OR WITHOUT PRESENCE OF ULCERS  *URINARY PROBLEMS  *BOWEL PROBLEMS  UNUSUAL RASH Items with * indicate a potential emergency and should be followed up as soon as possible.  Feel free to call the clinic you have any questions or concerns. The clinic phone number is [phone].  Please show the [place] at check-in to the Emergency Department and triage nurse.  Oxaliplatin Injection What is this medicine? OXALIPLATIN (ox AL i PLA tin) is a chemotherapy drug. It targets fast dividing cells, like cancer cells, and causes these cells to die. This medicine is used to treat cancers of the colon and rectum, and many other cancers. This medicine may be used for other purposes; ask your health care provider or pharmacist if you have questions. COMMON BRAND NAME(S): Eloxatin What should I tell my health care provider before I take this medicine? They need to know if you have any of these conditions: -kidney disease -an unusual or allergic reaction to oxaliplatin, other chemotherapy, other medicines, foods, dyes, or preservatives -pregnant or trying to get pregnant -breast-feeding How should I use this medicine? This drug is given as an infusion into a vein. It is  administered in a hospital or clinic by a specially trained health care professional. Talk to your pediatrician regarding the use of this medicine in children. Special care may be needed. Overdosage: If you think you have taken too much of this medicine contact a poison control center or emergency room at once. NOTE: This medicine is only for you. Do not share this medicine with others. What if I miss a dose? It is important not to miss a dose. Call your doctor or health care professional if you are unable to keep an appointment. What may interact with this medicine? -medicines to increase blood counts like filgrastim, pegfilgrastim, sargramostim -probenecid -some antibiotics like amikacin, gentamicin, neomycin, polymyxin B, streptomycin, tobramycin -zalcitabine Talk to your doctor or health care professional before taking any of these medicines: -acetaminophen -aspirin -ibuprofen -ketoprofen -naproxen This list may not describe all possible interactions. Give your health care provider a list of all the medicines, herbs, non-prescription drugs, or dietary supplements you use. Also tell them if you smoke, drink alcohol, or use illegal drugs. Some items may interact with your medicine. What should I watch for while using this medicine? Your condition will be monitored carefully while you are receiving this medicine. You will need important blood work done while you are taking this medicine. This medicine can make you more sensitive to cold. Do not drink cold drinks or use ice. Cover exposed skin before coming in contact with cold temperatures or cold objects. When out in cold weather wear warm clothing and cover your mouth and nose to warm the air that goes into your lungs. Tell your doctor  if you get sensitive to the cold. This drug may make you feel generally unwell. This is not uncommon, as chemotherapy can affect healthy cells as well as cancer cells. Report any side effects. Continue your  course of treatment even though you feel ill unless your doctor tells you to stop. In some cases, you may be given additional medicines to help with side effects. Follow all directions for their use. Call your doctor or health care professional for advice if you get a fever, chills or sore throat, or other symptoms of a cold or flu. Do not treat yourself. This drug decreases your body's ability to fight infections. Try to avoid being around people who are sick. This medicine may increase your risk to bruise or bleed. Call your doctor or health care professional if you notice any unusual bleeding. Be careful brushing and flossing your teeth or using a toothpick because you may get an infection or bleed more easily. If you have any dental work done, tell your dentist you are receiving this medicine. Avoid taking products that contain aspirin, acetaminophen, ibuprofen, naproxen, or ketoprofen unless instructed by your doctor. These medicines may hide a fever. Do not become pregnant while taking this medicine. Women should inform their doctor if they wish to become pregnant or think they might be pregnant. There is a potential for serious side effects to an unborn child. Talk to your health care professional or pharmacist for more information. Do not breast-feed an infant while taking this medicine. Call your doctor or health care professional if you get diarrhea. Do not treat yourself. What side effects may I notice from receiving this medicine? Side effects that you should report to your doctor or health care professional as soon as possible: -allergic reactions like skin rash, itching or hives, swelling of the face, lips, or tongue -low blood counts - This drug may decrease the number of white blood cells, red blood cells and platelets. You may be at increased risk for infections and bleeding. -signs of infection - fever or chills, cough, sore throat, pain or difficulty passing urine -signs of decreased  platelets or bleeding - bruising, pinpoint red spots on the skin, black, tarry stools, nosebleeds -signs of decreased red blood cells - unusually weak or tired, fainting spells, lightheadedness -breathing problems -chest pain, pressure -cough -diarrhea -jaw tightness -mouth sores -nausea and vomiting -pain, swelling, redness or irritation at the injection site -pain, tingling, numbness in the hands or feet -problems with balance, talking, walking -redness, blistering, peeling or loosening of the skin, including inside the mouth -trouble passing urine or change in the amount of urine Side effects that usually do not require medical attention (report to your doctor or health care professional if they continue or are bothersome): -changes in vision -constipation -hair loss -loss of appetite -metallic taste in the mouth or changes in taste -stomach pain This list may not describe all possible side effects. Call your doctor for medical advice about side effects. You may report side effects to FDA at 1-800-FDA-1088. Where should I keep my medicine? This drug is given in a hospital or clinic and will not be stored at home. NOTE: This sheet is a summary. It may not cover all possible information. If you have questions about this medicine, talk to your doctor, pharmacist, or health care provider.  2018 Elsevier/Gold Standard (2008-01-19 17:22:47)

## 2017-03-03 ENCOUNTER — Ambulatory Visit (HOSPITAL_BASED_OUTPATIENT_CLINIC_OR_DEPARTMENT_OTHER): Payer: BLUE CROSS/BLUE SHIELD

## 2017-03-03 VITALS — BP 131/86 | HR 108 | Temp 98.8°F | Resp 18

## 2017-03-03 DIAGNOSIS — C7802 Secondary malignant neoplasm of left lung: Secondary | ICD-10-CM | POA: Diagnosis not present

## 2017-03-03 DIAGNOSIS — Z5112 Encounter for antineoplastic immunotherapy: Secondary | ICD-10-CM

## 2017-03-03 DIAGNOSIS — C321 Malignant neoplasm of supraglottis: Secondary | ICD-10-CM | POA: Diagnosis not present

## 2017-03-03 DIAGNOSIS — C189 Malignant neoplasm of colon, unspecified: Secondary | ICD-10-CM

## 2017-03-03 DIAGNOSIS — C7801 Secondary malignant neoplasm of right lung: Secondary | ICD-10-CM

## 2017-03-03 DIAGNOSIS — C187 Malignant neoplasm of sigmoid colon: Secondary | ICD-10-CM | POA: Diagnosis not present

## 2017-03-03 DIAGNOSIS — C7800 Secondary malignant neoplasm of unspecified lung: Secondary | ICD-10-CM

## 2017-03-03 LAB — MAGNESIUM: Magnesium: 2.4 mg/dl (ref 1.5–2.5)

## 2017-03-03 MED ORDER — SODIUM CHLORIDE 0.9% FLUSH
10.0000 mL | INTRAVENOUS | Status: DC | PRN
  Administered 2017-03-03: 10 mL
  Filled 2017-03-03: qty 10

## 2017-03-03 MED ORDER — SODIUM CHLORIDE 0.9 % IV SOLN
4.0000 mg/kg | Freq: Once | INTRAVENOUS | Status: AC
  Administered 2017-03-03: 400 mg via INTRAVENOUS
  Filled 2017-03-03: qty 20

## 2017-03-03 MED ORDER — SODIUM CHLORIDE 0.9 % IV SOLN
Freq: Once | INTRAVENOUS | Status: AC
  Administered 2017-03-03: 10:00:00 via INTRAVENOUS

## 2017-03-03 MED ORDER — HEPARIN SOD (PORK) LOCK FLUSH 100 UNIT/ML IV SOLN
500.0000 [IU] | Freq: Once | INTRAVENOUS | Status: AC | PRN
  Administered 2017-03-03: 500 [IU]
  Filled 2017-03-03: qty 5

## 2017-03-03 NOTE — Patient Instructions (Signed)
[facility] Discharge Instructions for Patients Receiving Chemotherapy  Today you received the following chemotherapy agents Vectibix.   To help prevent nausea and vomiting after your treatment, we encourage you to take your nausea medication as directed.    If you develop nausea and vomiting that is not controlled by your nausea medication, call the clinic.   BELOW ARE SYMPTOMS THAT SHOULD BE REPORTED IMMEDIATELY:  *FEVER GREATER THAN 100.5 F  *CHILLS WITH OR WITHOUT FEVER  NAUSEA AND VOMITING THAT IS NOT CONTROLLED WITH YOUR NAUSEA MEDICATION  *UNUSUAL SHORTNESS OF BREATH  *UNUSUAL BRUISING OR BLEEDING  TENDERNESS IN MOUTH AND THROAT WITH OR WITHOUT PRESENCE OF ULCERS  *URINARY PROBLEMS  *BOWEL PROBLEMS  UNUSUAL RASH Items with * indicate a potential emergency and should be followed up as soon as possible.  Feel free to call the clinic you have any questions or concerns. The clinic phone number is [phone].  Please show the [place] at check-in to the Emergency Department and triage nurse.  Panitumumab Solution for Injection What is this medicine? PANITUMUMAB (pan i TOOM ue mab) is a monoclonal antibody. It is used to treat colorectal cancer. This medicine may be used for other purposes; ask your health care provider or pharmacist if you have questions. COMMON BRAND NAME(S): Vectibix What should I tell my health care provider before I take this medicine? They need to know if you have any of these conditions: -eye disease, vision problems -low levels of calcium, magnesium, or potassium in the blood -lung or breathing disease, like asthma -skin conditions or sensitivity -an unusual or allergic reaction to panitumumab, other medicines, foods, dyes, or preservatives -pregnant or trying to get pregnant -breast-feeding How should I use this medicine? This drug is given as an infusion into a vein. It is administered in a hospital or clinic by a  specially trained health care professional. Talk to your pediatrician regarding the use of this medicine in children. Special care may be needed. Overdosage: If you think you have taken too much of this medicine contact a poison control center or emergency room at once. NOTE: This medicine is only for you. Do not share this medicine with others. What if I miss a dose? It is important not to miss your dose. Call your doctor or health care professional if you are unable to keep an appointment. What may interact with this medicine? Do not take this medicine with any of the following medications: -bevacizumab This list may not describe all possible interactions. Give your health care provider a list of all the medicines, herbs, non-prescription drugs, or dietary supplements you use. Also tell them if you smoke, drink alcohol, or use illegal drugs. Some items may interact with your medicine. What should I watch for while using this medicine? Visit your doctor for checks on your progress. This drug may make you feel generally unwell. This is not uncommon, as chemotherapy can affect healthy cells as well as cancer cells. Report any side effects. Continue your course of treatment even though you feel ill unless your doctor tells you to stop. This medicine can make you more sensitive to the sun. Keep out of the sun while receiving this medicine and for 2 months after the last dose. If you cannot avoid being in the sun, wear protective clothing and use sunscreen. Do not use sun lamps or tanning beds/booths. In some cases, you may be given additional medicines to help with side effects. Follow all directions for  their use. Call your doctor or health care professional for advice if you get a fever, chills or sore throat, or other symptoms of a cold or flu. Do not treat yourself. This drug decreases your body's ability to fight infections. Try to avoid being around people who are sick. Avoid taking products that  contain aspirin, acetaminophen, ibuprofen, naproxen, or ketoprofen unless instructed by your doctor. These medicines may hide a fever. Do not become pregnant while taking this medicine and for 2 months after the last dose. Women should inform their doctor if they wish to become pregnant or think they might be pregnant. There is a potential for serious side effects to an unborn child. Talk to your health care professional or pharmacist for more information. Do not breast-feed an infant while taking this medicine or for 2 months after the last dose. What side effects may I notice from receiving this medicine? Side effects that you should report to your doctor or health care professional as soon as possible: -allergic reactions like skin rash, itching or hives, swelling of the face, lips, or tongue -breathing problems -changes in vision -eye pain -fast, irregular heartbeat -fever, chills -mouth sores -red spots on the skin -redness, blistering, peeling or loosening of the skin, including inside the mouth -signs and symptoms of kidney injury like trouble passing urine or change in the amount of urine -signs and symptoms of low blood pressure like dizziness; feeling faint or lightheaded, falls; unusually weak or tired -signs of low calcium like fast heartbeat, muscle cramps or muscle pain; pain, tingling, numbness in the hands or feet; seizures -signs and symptoms of low magnesium like muscle cramps, pain, or weakness; tremors; seizures; or fast, irregular heartbeat -signs and symptoms of low potassium like muscle cramps or muscle pain; chest pain; dizziness; feeling faint or lightheaded, falls; palpitations; breathing problems; or fast, irregular heartbeat -swelling of the ankles, feet, hands Side effects that usually do not require medical attention (report to your doctor or health care professional if they continue or are bothersome): -changes in skin like acne, cracks, skin  dryness -diarrhea -eyelash growth -headache -mouth sores -nail changes -nausea, vomiting This list may not describe all possible side effects. Call your doctor for medical advice about side effects. You may report side effects to FDA at 1-800-FDA-1088. Where should I keep my medicine? This drug is given in a hospital or clinic and will not be stored at home. NOTE: This sheet is a summary. It may not cover all possible information. If you have questions about this medicine, talk to your doctor, pharmacist, or health care provider.  2018 Elsevier/Gold Standard (2016-01-12 16:45:04)

## 2017-03-03 NOTE — Progress Notes (Signed)
Per MD OK to proceed with treatment without magnesium level. Will collect today for next treatment.   Wylene Simmer, BSN, RN 03/03/2017 10:06 AM

## 2017-03-14 ENCOUNTER — Other Ambulatory Visit: Payer: Self-pay | Admitting: *Deleted

## 2017-03-17 ENCOUNTER — Other Ambulatory Visit: Payer: Self-pay | Admitting: Hematology

## 2017-03-17 ENCOUNTER — Ambulatory Visit (HOSPITAL_BASED_OUTPATIENT_CLINIC_OR_DEPARTMENT_OTHER): Payer: BLUE CROSS/BLUE SHIELD

## 2017-03-17 VITALS — BP 127/71 | HR 98 | Temp 98.2°F | Resp 18

## 2017-03-17 DIAGNOSIS — C7802 Secondary malignant neoplasm of left lung: Secondary | ICD-10-CM

## 2017-03-17 DIAGNOSIS — Z5112 Encounter for antineoplastic immunotherapy: Secondary | ICD-10-CM

## 2017-03-17 DIAGNOSIS — C189 Malignant neoplasm of colon, unspecified: Secondary | ICD-10-CM

## 2017-03-17 DIAGNOSIS — C187 Malignant neoplasm of sigmoid colon: Secondary | ICD-10-CM

## 2017-03-17 DIAGNOSIS — C7801 Secondary malignant neoplasm of right lung: Secondary | ICD-10-CM | POA: Diagnosis not present

## 2017-03-17 DIAGNOSIS — C7800 Secondary malignant neoplasm of unspecified lung: Principal | ICD-10-CM

## 2017-03-17 MED ORDER — MOMETASONE FUROATE 0.1 % EX CREA: 1.0000 "application " | TOPICAL_CREAM | Freq: Every day | CUTANEOUS | 2 refills | Status: AC

## 2017-03-17 MED ORDER — PANITUMUMAB CHEMO INJECTION 100 MG/5ML
4.0000 mg/kg | Freq: Once | INTRAVENOUS | Status: AC
  Administered 2017-03-17: 400 mg via INTRAVENOUS
  Filled 2017-03-17: qty 20

## 2017-03-17 MED ORDER — SODIUM CHLORIDE 0.9 % IV SOLN
Freq: Once | INTRAVENOUS | Status: AC
  Administered 2017-03-17: 16:00:00 via INTRAVENOUS

## 2017-03-17 MED ORDER — HEPARIN SOD (PORK) LOCK FLUSH 100 UNIT/ML IV SOLN
500.0000 [IU] | Freq: Once | INTRAVENOUS | Status: AC | PRN
  Administered 2017-03-17: 500 [IU]
  Filled 2017-03-17: qty 5

## 2017-03-17 MED ORDER — SODIUM CHLORIDE 0.9% FLUSH
10.0000 mL | INTRAVENOUS | Status: DC | PRN
  Administered 2017-03-17: 10 mL
  Filled 2017-03-17: qty 10

## 2017-03-17 NOTE — Progress Notes (Signed)
Pt complaining of rash on cheeks, chest, back, and neck without pain or itching.  Afebrile.  A&OX4.  States that it is increasing in severity and is concerned about his current medication dose.  MD Burr Medico aware, came to speak with patient at bedside.  Dosage decreased per order.

## 2017-03-17 NOTE — Patient Instructions (Signed)
[facility] Discharge Instructions for Patients Receiving Chemotherapy  Today you received the following chemotherapy agents Vectibix  To help prevent nausea and vomiting after your treatment, we encourage you to take your nausea medication as directed.   If you develop nausea and vomiting that is not controlled by your nausea medication, call the clinic.   BELOW ARE SYMPTOMS THAT SHOULD BE REPORTED IMMEDIATELY:  *FEVER GREATER THAN 100.5 F  *CHILLS WITH OR WITHOUT FEVER  NAUSEA AND VOMITING THAT IS NOT CONTROLLED WITH YOUR NAUSEA MEDICATION  *UNUSUAL SHORTNESS OF BREATH  *UNUSUAL BRUISING OR BLEEDING  TENDERNESS IN MOUTH AND THROAT WITH OR WITHOUT PRESENCE OF ULCERS  *URINARY PROBLEMS  *BOWEL PROBLEMS  UNUSUAL RASH Items with * indicate a potential emergency and should be followed up as soon as possible.  Feel free to call the clinic you have any questions or concerns. The clinic phone number is [phone].  Please show the CHEMO ALERT CARD at check-in to the Emergency Department and triage nurse.   

## 2017-03-17 NOTE — Progress Notes (Signed)
[place]  [phone] [phone]) [phone]  Clinic Follow Up Note    03/20/2017  CHIEF COMPLAINTS:  Follow Up colon cancer  Oncology History   T2, Colon cancer metastasized to lung [facility])   Staging form: Colon and Rectum, AJCC 7th Edition     Clinical stage from 03/30/2015: Stage Unknown ([place], N1, M1) - Unsigned       Colon cancer metastasized to lung [facility]) s/p laparoscopic assisted sigmoid colectomy 11/02/15   03/30/2015 Miscellaneous    Foundation one genomic testing showed TP53 mutation, MSI stable, low tumor mutation burden. Negative for K-ras, NRAS and BRAF      03/30/2015 Initial Biopsy    Sigmoid mass biopsy showed invasive adenocarcinoma. Cecal colon polyps showed tubular adenoma.      03/30/2015 Initial Diagnosis    Colon cancer      03/30/2015 Procedure    colonoscopy by Dr. Michail Sermon showed a fungating, infiltrative and ulcerated nonobstructing large mass in the sigmoid colon and at 20 cm proximal to the anus. The mass was partially circumferential no bleeding. A 10 mm polyps in the cecum was removed.      04/03/2015 Imaging    CT chest, abdomen and pelvis with contrast showed nodular masslike area of clinical worsening at rectosigmoid junction, tiny pericolonic lymph nodes, bilateral pulmonary nodules measuring about 1 cm.      04/12/2015 PET scan    Hypermetabolic colonic mass near rectosigmoid junction, tiny subcentimeter paracolonic lymph nodes. Bilateral pulmonary metastasis.      04/19/2015 Procedure    CT-guided lung nodule biopsy attempted, unsuccessful.      04/27/2015 - 09/14/2015 Chemotherapy    Oxaliplatin 130 mg/m on day 1, Capecitabine 236m q12hr, 2 weeks on and one week off (only received 7 days for first cycle), oxaliplatin held on cycle 5 and dose reduced to 1023mm2, capecitabine reduced to 200032m12h D1-14, stopped per pt's request.       06/29/2015 - 10/19/2015 Chemotherapy     Panitumumab every 2 weeks, some  cycles were postponed due to pt's request, stoppe per pt's request       09/18/2015 Imaging    Pulmonary nodules are less hypermetabolic, stable size. Stable hypermetabolic portal hepatis and abdominal peritoneal ligament lymph nodes. No other new lesions.       11/02/2015 Surgery    sigmoid colon segmental resection       11/02/2015 Pathology Results    Sigmoid colon segmental resection showed adenocarcinoma, grade 3,  T2, 3 out of 13 lymph nodes were positive, surgical margins were negative. LVI(-), perineural invasion negative      11/13/2015 Imaging    CT chest, abdomen and pelvis with contrast showed postsurgical changes, mild progression of pulmonary metastasis, measuring up to 15 mm in the right lower lobe.      11/29/2015 - 12/05/2015 Radiation Therapy    SBRT to 4 lung lesions in 3 sessions       04/25/2016 - 08/05/2016 Chemotherapy    FOLFIRI every 2 weeks, and Avastin added from cycle 3, 5-FU held since cycle 2 due to poor tolerance. Chemo was stopped after 3 months due to overall poor tolerance and pt's request to stop.       06/27/2016 Imaging    CT CAP 06/27/16 IMPRESSION: Status post partial left hemicolectomy. Progressive wall thickening involving the colon near the suture line, worrisome for residual tumor. Adjacent 7 mm short axis pericolonic lymph node, possibly reflecting a nodal metastasis. Radiation changes in the posterior  right upper lobe and superior segment right lower lobe. Progressive pulmonary metastases bilaterally, measuring up to 13 mm, as above.      09/17/2016 Imaging    CT A/P/C w contrast  IMPRESSION: 1. Multifocal pulmonary nodules are not significantly changed in size compared with the previous exam. 2. Stable appearance of radiation changes within the right midlung. 3. Stable to scratch set improved appearance of wall thickening involving the colon at the level of the suture line.       11/11/2016 Imaging    CT Chest WO Contrast  11/11/16: IMPRESSION: 1. Minimal enlargement of some pulmonary metastases. 2. Coronary artery calcification. 3. Hepatic steatosis.       02/10/2017 Imaging    CT CAP W Contrast 02/10/17 IMPRESSION: 1. Progressive metastatic disease to the thorax as demonstrated by increased size of numerous previously noted pulmonary nodules, what appears to be lymphangitic spread of disease developing in the right upper lobe and superior segment of the right lower lobe, and new 1.5 cm short axis prevascular lymph node. 2. No definite findings of metastatic disease in the abdomen or pelvis. 3. Multiple tiny nonobstructive calculi in the collecting systems of the kidneys bilaterally measuring 2-3 mm in size. No ureteral stones or findings of urinary tract obstruction are noted at this time. 4. Hepatic steatosis. 5. Aortic atherosclerosis, in addition to left anterior descending coronary artery disease. Please note that although the presence of coronary artery calcium documents the presence of coronary artery disease, the severity of this disease and any potential stenosis cannot be assessed on this non-gated CT examination. Assessment for potential risk factor modification, dietary therapy or pharmacologic therapy may be warranted, if clinically indicated. 6. There are calcifications of the aortic valve. Echocardiographic correlation for evaluation of potential valvular dysfunction may be warranted if clinically indicated.        Chemotherapy    Third-line chemotherapy Xeloda for 2 weeks on and 1 week off with Oxaliplatin every 3 weeks and Panitumumab every 2 weeks. We will start Xeloda and Oxaliplatin on 02/27/17 with antibody starting on the 8/27 or 8/28.         HISTORY OF PRESENTING ILLNESS:  Bill Wright 56 y.o. male is here because of recently newly diagnosed colon cancer.  He has had intermittent bloody stool for 2 years, it has been mild, mixed with stool, patient does not have any  abdominal pain, constipation, change of his bowel habits, nausea, weight loss or other symptoms. He did not seek medical attention for this. He went to emergency room on 03/15/2015 for right flank pain, due to his kidney stone. CT scan incidentally found a 11 mm right lower lobe nodule and mucosal edema in the sigmoid colon. He saw his primary care physician, and was referred to GI Dr. Michail Sermon here at he underwent colonoscopy on 03/30/2015, which showed a fungating infiltrative and ulcerated nonobstructing large mass in the sigmoid colon, biopsy showed adenocarcinoma. CT chest abdomen and pelvis showed multiple lung nodules measuring about 1 cm. He was referred to surgeon Dr. Zella Richer, who referred patient to [place] for further workup of his lung nodule and discuss chemotherapy.  He feels very well overall, denies any symptoms. 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 He never had screening colonoscopy prior the reason one, no significant past medical history, does not see doctors regularly.   CURRENT TREATMENT:  Third line chemotherapy: Xeloda for 2 weeks on and 1 week off with Oxaliplatin every  3 weeks and Panitumumab every 2 weeks. Panitumumab every 2 weeks on a different day started on 03/03/2017.   INTERIM HISTORY  Bill Wright returns for follow-up. He reports his rash is better with use of prescription cream. He uses this cream on his face once daily. He admits this week his face is more red. He denies pain associated with this redness however, he can feel it. He denies any white heads associated with rash. He has not been taking the Xeloda stating, "I can't deal with that stuff". He reports nausea with infusion treatment and is requesting additional medication. This nausea does not subside for about 4-5 days. He reports oral anti-nausea medication does not help. He believes the port is helping and makes the medication more tolerable. He is not interested in receiving  oxaliplatin, 5-FU, and vectibix together. He is not interested in 5-FU pump, even with a decreased dose. He would prefer to come more frequently with less chair time each visit as it is more manageable for him. The patient would prefer to receive medication by IV versus oral.   MEDICAL HISTORY:  Past Medical History:  Diagnosis Date  . Anxiety    situational due to cancer diagnosis  . Cancer [facility]) 2017   colon-chemo 09/22/15 now surgery  . Hypercholesteremia   . Kidney calculi     SURGICAL HISTORY: Past Surgical History:  Procedure Laterality Date  . COLONOSCOPY  03/30/15  . LAPAROSCOPIC PARTIAL COLECTOMY N/A 11/02/2015   Procedure: LAPAROSCOPIC ASSISTED SIGMOID COLECTOMY;  Surgeon: Jackolyn Confer, MD;  Location: [facility];  Service: General;  Laterality: N/A;  . PORTACATH PLACEMENT N/A 05/08/2016   Procedure: INSERTION PORT-A-CATH;  Surgeon: Jackolyn Confer, MD;  Location: [facility];  Service: General;  Laterality: N/A;    SOCIAL HISTORY: Social History   Social History  . Marital Status: Married    Spouse Name: N/A  . Number of Children: 3, age of 28, 50 and 24   . Years of Education: N/A   Occupational History  . Banker for [employer] [place]    Social History Main Topics  . Smoking status: Never Smoker   . Smokeless tobacco: Not on file  . Alcohol Use: No  . Drug Use: No  . Sexual Activity: Not on file   Other Topics Concern  . Not on file   Social History Narrative    FAMILY HISTORY: Family History  Problem Relation Age of Onset  . Breast cancer Mother 44       +rad and lymph node  . Diabetes Maternal Grandmother        leading to blindness  . Obesity Maternal Aunt   . Stroke Maternal Uncle 65    ALLERGIES:  has No Known Allergies.  MEDICATIONS:  No current facility-administered medications for this visit.    Current Outpatient Prescriptions  Medication Sig Dispense Refill  . Fluticasone-Salmeterol (ADVAIR DISKUS) 250-50 MCG/DOSE AEPB Inhale 1 puff into the  lungs 2 (two) times daily. (Patient taking differently: Inhale 1 puff into the lungs daily. ) 180 each 1  . HYDROcodone-acetaminophen (NORCO/VICODIN) 5-325 MG tablet Take 1 tablet by mouth every 6 (six) hours as needed for moderate pain or severe pain. 20 tablet 0  . mometasone (ELOCON) 0.1 % cream Apply 1 application topically daily. 45 g 2  . prochlorperazine (COMPAZINE) 10 MG tablet Take 1 tablet (10 mg total) by mouth every 6 (six) hours as needed (NAUSEA). (Patient not taking: Reported on 03/20/2017) 30 tablet 2  . sildenafil (VIAGRA)  100 MG tablet Take 100 mg by mouth as needed for erectile dysfunction.   5   Facility-Administered Medications Ordered in Other Visits  Medication Dose Route Frequency Provider Last Rate Last Dose  . 0.9 %  sodium chloride infusion  250 mL Intravenous PRN Dixie Dials, MD      . ALPRAZolam Duanne Moron) tablet 0.25 mg  0.25 mg Oral TID PRN Dixie Dials, MD      . aspirin EC tablet 81 mg  81 mg Oral Daily Dixie Dials, MD      . heparin injection 5,000 Units  5,000 Units Subcutaneous Q8H Dixie Dials, MD      . heparin lock flush 100 unit/mL  500 Units Intracatheter Once PRN Truitt Merle, MD      . HYDROcodone-acetaminophen (NORCO/VICODIN) 5-325 MG per tablet 1 tablet  1 tablet Oral Q6H PRN Dixie Dials, MD      . mometasone-formoterol (DULERA) 200-5 MCG/ACT inhaler 2 puff  2 puff Inhalation BID Dixie Dials, MD      . ondansetron (ZOFRAN) tablet 8 mg  8 mg Oral BID PRN Dixie Dials, MD      . prochlorperazine (COMPAZINE) tablet 10 mg  10 mg Oral Q6H PRN Dixie Dials, MD      . sodium chloride flush (NS) 0.9 % injection 10 mL  10 mL Intracatheter PRN Truitt Merle, MD      . sodium chloride flush (NS) 0.9 % injection 3 mL  3 mL Intravenous Q12H Dixie Dials, MD      . sodium chloride flush (NS) 0.9 % injection 3 mL  3 mL Intravenous PRN Dixie Dials, MD        REVIEW OF SYSTEMS:  Constitutional: Denies fevers, chills or abnormal night sweats, no weight  loss. Eyes: Denies blurriness of vision, double vision or watery eyes Ears, nose, mouth, throat, and face: Denies mucositis or sore throat Respiratory: Denies dyspnea or wheezes  Cardiovascular: Denies palpitation, chest discomfort or lower extremity swelling Gastrointestinal:  Denies heartburn or change in bowel habits (+) nausea Skin: (+) rash, improved with prescription cream Lymphatics: Denies new lymphadenopathy or easy bruising Neurological:Denies numbness, tingling or new weaknesses MSK: (+) bilateral lateral abdominal muscle cramps Behavioral/Psych: Mood is stable, no new changes  All other systems were reviewed with the patient and are negative.  PHYSICAL EXAMINATION: ECOG PERFORMANCE STATUS: 0 BP 140/86 (BP Location: Left Arm, Patient Position: Sitting)   Pulse (!) 114   Temp 98.6 F (37 C) (Oral)   Resp 18   Ht 5' 6"  (1.676 m)   Wt 223 lb 14.4 oz (101.6 kg)   SpO2 100%   BMI 36.14 kg/m   GENERAL:alert, no distress and comfortable SKIN: skin color, texture, turgor are normal. No rash noted on the face, except mild skin erythema on his cheeks. Diffuse acne-like skin rash on his neck and upper chest, no skin erythema or signs of infection. EYES: normal, conjunctiva are pink and non-injected, sclera clear OROPHARYNX:no exudate, no erythema and lips, buccal mucosa, and tongue normal  NECK: supple, thyroid normal size, non-tender, without nodularity LYMPH:  no palpable lymphadenopathy in the cervical, axillary or inguinal LUNGS: clear to auscultation and percussion with normal breathing effort HEART: regular rate & rhythm and no murmurs and no lower extremity edema ABDOMEN:abdomen soft, non-tender and normal bowel sounds. The midline incision has healed well, no discharge or skin erythema  Musculoskeletal:no cyanosis of digits and no clubbing  PSYCH: alert & oriented x 3 with fluent speech NEURO: no focal  motor/sensory deficits  LABORATORY DATA:  I have reviewed the data  as listed CBC Latest Ref Rng & Units 03/20/2017 03/20/2017 03/20/2017  WBC 4.0 - 10.5 K/uL - 13.3(H) 6.0  Hemoglobin 13.0 - 17.0 g/dL 15.6 15.1 13.6  Hematocrit 39.0 - 52.0 % 46.0 44.8 40.7  Platelets 150 - 400 K/uL - 251 217   CMP Latest Ref Rng & Units 03/20/2017 03/20/2017 03/20/2017  Glucose 65 - 99 mg/dL 117(H) 116(H) 148(H)  BUN 6 - 20 mg/dL 11 12 10.5  Creatinine 0.61 - 1.24 mg/dL 0.80 0.96 0.8  Sodium 135 - 145 mmol/L 140 137 139  Potassium 3.5 - 5.1 mmol/L 3.4(L) 3.3(L) 3.7  Chloride 101 - 111 mmol/L 102 104 -  CO2 22 - 32 mmol/L - 22 26  Calcium 8.9 - 10.3 mg/dL - 8.5(L) 9.3  Total Protein 6.5 - 8.1 g/dL - 6.7 7.4  Total Bilirubin 0.3 - 1.2 mg/dL - 0.7 0.37  Alkaline Phos 38 - 126 U/L - 81 95  AST 15 - 41 U/L - 31 23  ALT 17 - 63 U/L - 34 33   CEA:  04/20/2016: <0.5 06/08/2015: <0.5 09/07/2015: <0.5 11/15/2015: 0.9 02/16/2016: <1.0  04/25/2016: 1.6 05/23/16: <1.00 06/20/2016: 1.05 07/22/2016: <1.00 09/17/16: <1.00 11/14/16: 1.60 02/10/17: 5.94 03/20/2017: TODAY PENDING  PATHOLOGY REPORT  Diagnosis 11/02/2015 1. Colon, segmental resection for tumor, sigmoid ADENOCARCINOMA OF THE SIGMOID COLON (2.0 CM), GRADE 3 THE TUMOR INVADES MUSCULARIS PROPRIA MARGINS OF RESECTION ARE NEGATIVE METASTATIC ADENOCARCINOMA IN THREE OF THIRTEEN LYMPH NODES (3/13) 2. Colon, resection margin (donut), distal sigmoid BENIGN COLONIC TISSUE Microscopic Comment 1. COLON AND RECTUM (INCLUDING TRANS-ANAL RESECTION): Specimen: Sigmoid Procedure: Segmental resection Tumor site: sigmoid Specimen integrity: Intact Macroscopic intactness of mesorectum: Not applicable: x Complete: NA Near complete: NA Incomplete: NA Cannot be determined (specify): NA Macroscopic tumor perforation: Muscularis Invasive tumor: Maximum size: 2.0 cm Histologic type(s): Adenocarcinoma Histologic grade and differentiation: G3 G1: well differentiated/low grade G2: moderately differentiated/low grade G3: poorly  differentiated/high grade G4: undifferentiated/high grade Type of polyp in which invasive carcinoma arose: Tubular adenoma Microscopic extension of invasive tumor: Muscularis propria Lymph-Vascular invasion: Negative Peri-neural invasion: Negative Tumor deposit(s) (discontinuous extramural extension): Negative Resection margins: Proximal margin: Negative Distal margin: Negative Circumferential (radial) (posterior ascending, posterior descending; lateral and posterior mid-rectum; and entire lower 1/3 rectum):Negative Mesenteric margin (sigmoid and transverse): Negative Distance closest margin (if all above margins negative): 3.5 cm Trans-anal resection margins only: Deep margin: NA Mucosal Margin: NA Distance closest mucosal margin (if negative): NA Treatment effect (neo-adjuvant therapy): Partial Additional polyp(s): None Non-neoplastic findings: unremarkable Lymph nodes: number examined 13; number positive: 3 Pathologic Staging: T2, N1b, M1a     RADIOGRAPHIC STUDIES: I have personally reviewed the radiological images as listed and agreed with the findings in the report.  PET 04/12/2015 IMPRESSION: Hypermetabolic colonic mass near rectosigmoid junction, consistent with known primary colon carcinoma. Tiny sub-cm pericolonic lymph nodes in sigmoid mesocolon are too small to characterize by PET, but are suspicious for early lymph node metastases. Bilateral pulmonary metastases  PET 09/18/2015 IMPRESSION: 1. Hypermetabolic pulmonary metastases have enlarged slightly from 07/18/2015. 2. Hypermetabolic porta hepatis/abdominal peritoneal ligament lymph nodes, stable from 04/12/2015. 3. Increase in hypermetabolism associated with mesenteric haziness and nodularity. While a reactive phenomenon/panniculitis can create this in appearance, metastatic disease/lymphoproliferative disorder cannot be excluded. 4. Hepatic steatosis. 5. Bilateral nephrolithiasis.  CT chest, abdomen  and pelvis with contrast 06/27/2016 IMPRESSION: Status post partial left hemicolectomy. Progressive wall thickening involving the colon near the suture  line, worrisome for residual tumor. Adjacent 7 mm short axis pericolonic lymph node, possibly reflecting a nodal metastasis. Radiation changes in the posterior right upper lobe and superior segment right lower lobe. Progressive pulmonary metastases bilaterally, measuring up to 13 mm, as above.   CT A/P/C w contrast 09/17/16:  IMPRESSION: 1. Multifocal pulmonary nodules are not significantly changed in size compared with the previous exam. 2. Stable appearance of radiation changes within the right midlung. 3. Stable to scratch set improved appearance of wall thickening involving the colon at the level of the suture line.   CT Chest WO Contrast 11/11/16: IMPRESSION: 1. Minimal enlargement of some pulmonary metastases. 2. Coronary artery calcification. 3. Hepatic steatosis.   CT CAP W Contrast 02/10/17 IMPRESSION: 1. Progressive metastatic disease to the thorax as demonstrated by increased size of numerous previously noted pulmonary nodules, what appears to be lymphangitic spread of disease developing in the right upper lobe and superior segment of the right lower lobe, and new 1.5 cm short axis prevascular lymph node. 2. No definite findings of metastatic disease in the abdomen or pelvis. 3. Multiple tiny nonobstructive calculi in the collecting systems of the kidneys bilaterally measuring 2-3 mm in size. No ureteral stones or findings of urinary tract obstruction are noted at this time. 4. Hepatic steatosis. 5. Aortic atherosclerosis, in addition to left anterior descending coronary artery disease. Please note that although the presence of coronary artery calcium documents the presence of coronary artery disease, the severity of this disease and any potential stenosis cannot be assessed on this non-gated CT examination.  Assessment for potential risk factor modification, dietary therapy or pharmacologic therapy may be warranted, if clinically indicated. 6. There are calcifications of the aortic valve. Echocardiographic correlation for evaluation of potential valvular dysfunction may be warranted if clinically indicated.   ASSESSMENT & PLAN:  56 y.o. male, without significant past medical history except kidney stone, presented with intermittent bloody stool for 2 years, and colonoscopy showed a large sigmoid colon mass, CT scan showed multiple (at least 4) nodules in bilateral lungs, measuring about 1 cm.  1. Sigmoid colon adenocarcinoma, [id number], stage IV with lung mets, KRA/NRAS wild type, MSI-stable -I previously reviewed his colonoscopy, initial CT scan findings and the biopsy results in great details with patient and his wife. -he received first line chemo FOLFOX and panitumumab, followed by hemicolectomy, SBRT to 4 lung nodules, and second line chemo irinotecan and avastin, chemotherapy was finally stopped due to his poor tolerance. -I previously reviewed his surgical pathology findings, which showed a residual T2 primary tumor, 3 lymph nodes positive, grade 3 disease, certainly high risk disease.  -We previously discussed that his disease is likely incurable, and the goal of therapy is maximum disease control and prolong his life.  -- His tumor does not contain KRAS/NRAS or BRAF mutation, so he would benefit from EGFR antibody, Panitumumab was added on from cycle 4 but tolerated poorly dur to skin rash  -He is off chemotherapy now, clinically doing well, with mild dry cough and dyspnea, otherwise asymptomatic. -I previously reviewed his 02/10/17 restaging CT chest, which showed significant increase the size of multiple lung nodules, and possible lymphogenic spread of disease in the right upper lobe and superior segment of right lower lobe.   -I strongly recommend him to resume chemotherapy due to his disease  progression, he is also moderately symptomatically. Patient has been previously resistant to chemotherapy previously due to poor tolerance. After lengthy discussion, patient agreed to restart chemotherapy. -We previously  discussed his current chemo therapy options. He previously received CAPOX and FOLFIRI, no disease progression on chemo. Based on side effects Pt prefers to resume CAPOX every 3 weeks and Panitumumab every 2 weeks, which was his first line chemo. Due to his tolerance issue, will slightly decrease his dose.  -he tolerated the Xeloda poorly, and refused to continue. We'll changed to 5-FU, however he declined 5-FU pump infusion, we'll changed to weekly 5-FU bolus with leucovorin on day 8 and 15 of 21 day cycle, with oxaliplatin on day 1. He refuses to have his intravenous chemotherapy on same day.  -Will repeat CT every 2-3 months -He developed moderate to severe nausea after oxaliplatin 3 weeks ago, I'll add fosaprepitant as premeds today before oxaliplatin   2. Dry cough and dyspnea  -His initial cough is likely related to his SBRT,he received a course of prednisone  -I'm concerned he is dry cough to be related to his cancer progression in the lung  -previously Much improved since he started chemotherapy -Based on Pt discussion of symptoms he had possible URI that is now resolved. I strongly encouraged him to contact us when he gets any symptoms  -His mild dyspnea is probably related to the lung metastasis, especially lymphogenic spread in right lung.    3. Obesity  -I again encouraged him to eat healthy and exercise   4. Goal of care discussion  -We again discussed the incurable nature of his cancer, although his cancer seems to be more indolent -The patient understands the goal of care is palliative. -He is full code now   5. Pain medication -He has been taking Oxycodone once a day as needed due to his high intensity job and some chest discomfort which could be related to  stress.  -He really does not have much cancer related pain. We discussed narcotic addiction, I recommend him to gradually wean off oxycodone. He tried but not successful   6. Nausea from chemo  -Nausea which persists 4-5 days after treatment. No improvement with oral anti-nausea medication. -will add iv fosaprepitant before chemo today   PLAN -He will proceed with oxaliplatin today, will add iv fosaprepitant before chemo today for his nauseas, approved by his insurance  -plan to start weekly bolus 5-fu and LV infusion in 1 and 2 weeks  -He will continue panitumumab every 2 weeks, next due on September 24 -I will see him again for follow up in 3 weeks with lab and oxaliplatin treatment   I spent 25 minutes counseling the patient face to face. The total time spent in the appointment was 35 minutes and more than 50% was on counseling.  This document serves as a record of services personally performed by Truitt Merle, MD. It was created on her behalf by Arlyce Harman, a trained medical scribe. The creation of this record is based on the scribe's personal observations and the provider's statements to them. This document has been checked and approved by the attending provider.  I have reviewed the above documentation for accuracy and completeness and I agree with the above.   Truitt Merle, MD 03/20/2017   Addendum Patient went to the infusion room for treatment, received pre-medication including Aloxi, dexamethasone and fosaprepitant. 20 minutes after he completed his pre-meds, oxaliplatin was started. In a few minutes, he developed cough, diaphoresis, and dyspnea. He quickly lost consciousness, and breathing became agonic. CODE BLUE was called. I arrived the infusion room, he was unresponsive, no pulse, cardiac monitoring showing sinus rhythm in  100-120, he received 1 dose of intravenous epinephrine, and Solu-Medrol 125 mg. CPR lasted about 9 minutes. He was not intubated. Pulse and spontaneous  breathing returned, he became awake, with stable VS. He was transferred to [facility] emergency room. I saw him in the ED, he was awake, alert, oriented, answering questions appropriately and follow commands. He complaints of fatigue and chest pain, he will likely to be admitted to [place].   I spoke with his wife twice and updated his condition.  Truitt Merle  03/20/2017 6pm.

## 2017-03-20 ENCOUNTER — Ambulatory Visit (HOSPITAL_BASED_OUTPATIENT_CLINIC_OR_DEPARTMENT_OTHER): Payer: BLUE CROSS/BLUE SHIELD | Admitting: Hematology

## 2017-03-20 ENCOUNTER — Other Ambulatory Visit: Payer: Self-pay

## 2017-03-20 ENCOUNTER — Encounter: Payer: Self-pay | Admitting: Hematology

## 2017-03-20 ENCOUNTER — Ambulatory Visit: Payer: BLUE CROSS/BLUE SHIELD

## 2017-03-20 ENCOUNTER — Encounter (HOSPITAL_COMMUNITY): Payer: Self-pay | Admitting: *Deleted

## 2017-03-20 ENCOUNTER — Emergency Department (HOSPITAL_COMMUNITY): Payer: BLUE CROSS/BLUE SHIELD

## 2017-03-20 ENCOUNTER — Ambulatory Visit (HOSPITAL_BASED_OUTPATIENT_CLINIC_OR_DEPARTMENT_OTHER): Payer: BLUE CROSS/BLUE SHIELD

## 2017-03-20 ENCOUNTER — Observation Stay (HOSPITAL_COMMUNITY)
Admission: EM | Admit: 2017-03-20 | Discharge: 2017-03-21 | Disposition: A | Discharge status: Home / Self Care | Payer: BLUE CROSS/BLUE SHIELD | Attending: Cardiovascular Disease | Admitting: Cardiovascular Disease

## 2017-03-20 ENCOUNTER — Other Ambulatory Visit (HOSPITAL_BASED_OUTPATIENT_CLINIC_OR_DEPARTMENT_OTHER): Payer: BLUE CROSS/BLUE SHIELD

## 2017-03-20 VITALS — BP 140/86 | HR 114 | Temp 98.6°F | Resp 18 | Ht 66.0 in | Wt 223.9 lb

## 2017-03-20 DIAGNOSIS — Z87442 Personal history of urinary calculi: Secondary | ICD-10-CM | POA: Diagnosis not present

## 2017-03-20 DIAGNOSIS — C189 Malignant neoplasm of colon, unspecified: Secondary | ICD-10-CM

## 2017-03-20 DIAGNOSIS — Z9049 Acquired absence of other specified parts of digestive tract: Secondary | ICD-10-CM | POA: Insufficient documentation

## 2017-03-20 DIAGNOSIS — I469 Cardiac arrest, cause unspecified: Secondary | ICD-10-CM | POA: Diagnosis present

## 2017-03-20 DIAGNOSIS — C187 Malignant neoplasm of sigmoid colon: Secondary | ICD-10-CM | POA: Diagnosis not present

## 2017-03-20 DIAGNOSIS — C7801 Secondary malignant neoplasm of right lung: Secondary | ICD-10-CM

## 2017-03-20 DIAGNOSIS — E669 Obesity, unspecified: Secondary | ICD-10-CM | POA: Insufficient documentation

## 2017-03-20 DIAGNOSIS — C7800 Secondary malignant neoplasm of unspecified lung: Secondary | ICD-10-CM | POA: Diagnosis not present

## 2017-03-20 DIAGNOSIS — R61 Generalized hyperhidrosis: Secondary | ICD-10-CM

## 2017-03-20 DIAGNOSIS — J40 Bronchitis, not specified as acute or chronic: Secondary | ICD-10-CM | POA: Diagnosis not present

## 2017-03-20 DIAGNOSIS — R05 Cough: Secondary | ICD-10-CM

## 2017-03-20 DIAGNOSIS — R0689 Other abnormalities of breathing: Secondary | ICD-10-CM | POA: Diagnosis present

## 2017-03-20 DIAGNOSIS — F419 Anxiety disorder, unspecified: Secondary | ICD-10-CM | POA: Diagnosis not present

## 2017-03-20 DIAGNOSIS — T451X5A Adverse effect of antineoplastic and immunosuppressive drugs, initial encounter: Secondary | ICD-10-CM | POA: Insufficient documentation

## 2017-03-20 DIAGNOSIS — E876 Hypokalemia: Secondary | ICD-10-CM | POA: Insufficient documentation

## 2017-03-20 DIAGNOSIS — D72829 Elevated white blood cell count, unspecified: Secondary | ICD-10-CM | POA: Insufficient documentation

## 2017-03-20 DIAGNOSIS — R110 Nausea: Secondary | ICD-10-CM | POA: Diagnosis not present

## 2017-03-20 DIAGNOSIS — I517 Cardiomegaly: Secondary | ICD-10-CM | POA: Diagnosis not present

## 2017-03-20 DIAGNOSIS — R0600 Dyspnea, unspecified: Secondary | ICD-10-CM

## 2017-03-20 DIAGNOSIS — R55 Syncope and collapse: Secondary | ICD-10-CM | POA: Diagnosis not present

## 2017-03-20 DIAGNOSIS — Z833 Family history of diabetes mellitus: Secondary | ICD-10-CM | POA: Insufficient documentation

## 2017-03-20 DIAGNOSIS — E7800 Pure hypercholesterolemia, unspecified: Secondary | ICD-10-CM | POA: Insufficient documentation

## 2017-03-20 DIAGNOSIS — Z5111 Encounter for antineoplastic chemotherapy: Secondary | ICD-10-CM

## 2017-03-20 DIAGNOSIS — R000 Tachycardia, unspecified: Secondary | ICD-10-CM | POA: Insufficient documentation

## 2017-03-20 DIAGNOSIS — E785 Hyperlipidemia, unspecified: Secondary | ICD-10-CM | POA: Diagnosis not present

## 2017-03-20 DIAGNOSIS — I468 Cardiac arrest due to other underlying condition: Principal | ICD-10-CM | POA: Insufficient documentation

## 2017-03-20 DIAGNOSIS — C7802 Secondary malignant neoplasm of left lung: Secondary | ICD-10-CM | POA: Diagnosis not present

## 2017-03-20 DIAGNOSIS — Z95828 Presence of other vascular implants and grafts: Secondary | ICD-10-CM

## 2017-03-20 DIAGNOSIS — R739 Hyperglycemia, unspecified: Secondary | ICD-10-CM | POA: Insufficient documentation

## 2017-03-20 DIAGNOSIS — Y92238 Other place in hospital as the place of occurrence of the external cause: Secondary | ICD-10-CM | POA: Diagnosis not present

## 2017-03-20 DIAGNOSIS — Z803 Family history of malignant neoplasm of breast: Secondary | ICD-10-CM | POA: Insufficient documentation

## 2017-03-20 DIAGNOSIS — Z823 Family history of stroke: Secondary | ICD-10-CM | POA: Diagnosis not present

## 2017-03-20 LAB — COMPREHENSIVE METABOLIC PANEL
ALBUMIN: 3.5 g/dL (ref 3.5–5.0)
ALT: 33 U/L (ref 0–55)
ALT: 34 U/L (ref 17–63)
ANION GAP: 11 (ref 5–15)
AST: 23 U/L (ref 5–34)
AST: 31 U/L (ref 15–41)
Albumin: 3.7 g/dL (ref 3.5–5.0)
Alkaline Phosphatase: 95 U/L (ref 40–150)
Alkaline Phosphatase: 81 U/L (ref 38–126)
Anion Gap: 10 mEq/L (ref 3–11)
BILIRUBIN TOTAL: 0.37 mg/dL (ref 0.20–1.20)
BILIRUBIN TOTAL: 0.7 mg/dL (ref 0.3–1.2)
BUN: 10.5 mg/dL (ref 7.0–26.0)
BUN: 12 mg/dL (ref 6–20)
CALCIUM: 8.5 mg/dL — AB (ref 8.9–10.3)
CHLORIDE: 103 meq/L (ref 98–109)
CO2: 26 meq/L (ref 22–29)
CO2: 22 mmol/L (ref 22–32)
CREATININE: 0.8 mg/dL (ref 0.7–1.3)
Calcium: 9.3 mg/dL (ref 8.4–10.4)
Chloride: 104 mmol/L (ref 101–111)
Creatinine, Ser: 0.96 mg/dL (ref 0.61–1.24)
EGFR: >90 mL/min/{1.73_m2} (ref >90)
GFR calc Af Amer: >60 mL/min (ref >60)
GFR calc non Af Amer: >60 mL/min (ref >60)
GLUCOSE: 148 mg/dL — AB (ref 70–140)
GLUCOSE: 116 mg/dL — AB (ref 65–99)
Potassium: 3.7 mEq/L (ref 3.5–5.1)
Potassium: 3.3 mmol/L — ABNORMAL LOW (ref 3.5–5.1)
SODIUM: 139 meq/L (ref 136–145)
Sodium: 137 mmol/L (ref 135–145)
TOTAL PROTEIN: 7.4 g/dL (ref 6.4–8.3)
TOTAL PROTEIN: 6.7 g/dL (ref 6.5–8.1)

## 2017-03-20 LAB — CBC WITH DIFFERENTIAL/PLATELET
BASO%: 0.5 % (ref 0.0–2.0)
BASOS PCT: 0 %
Basophils Absolute: 0 10*3/uL (ref 0.0–0.1)
Basophils Absolute: 0 10*3/uL (ref 0.0–0.1)
EOS ABS: 0.1 10*3/uL (ref 0.0–0.7)
EOS%: 0.9 % (ref 0.0–7.0)
Eosinophils Absolute: 0.1 10*3/uL (ref 0.0–0.5)
Eosinophils Relative: 0 %
HCT: 40.7 % (ref 38.4–49.9)
HCT: 44.8 % (ref 39.0–52.0)
HGB: 13.6 g/dL (ref 13.0–17.1)
Hemoglobin: 15.1 g/dL (ref 13.0–17.0)
LYMPH%: 19.1 % (ref 14.0–49.0)
Lymphocytes Relative: 17 %
Lymphs Abs: 2.2 10*3/uL (ref 0.7–4.0)
MCH: 29 pg (ref 27.2–33.4)
MCH: 28.9 pg (ref 26.0–34.0)
MCHC: 33.5 g/dL (ref 32.0–36.0)
MCHC: 33.7 g/dL (ref 30.0–36.0)
MCV: 86.7 fL (ref 79.3–98.0)
MCV: 85.8 fL (ref 78.0–100.0)
MONO ABS: 0.6 10*3/uL (ref 0.1–1.0)
MONO#: 0.5 10*3/uL (ref 0.1–0.9)
MONO%: 9.1 % (ref 0.0–14.0)
MONOS PCT: 4 %
NEUT%: 70.4 % (ref 39.0–75.0)
NEUTROS ABS: 4.2 10*3/uL (ref 1.5–6.5)
Neutro Abs: 10.5 10*3/uL — ABNORMAL HIGH (ref 1.7–7.7)
Neutrophils Relative %: 79 %
PLATELETS: 217 10*3/uL (ref 140–400)
Platelets: 251 10*3/uL (ref 150–400)
RBC: 4.7 10*6/uL (ref 4.20–5.82)
RBC: 5.22 MIL/uL (ref 4.22–5.81)
RDW: 14.7 % — ABNORMAL HIGH (ref 11.0–14.6)
RDW: 14.2 % (ref 11.5–15.5)
WBC: 6 10*3/uL (ref 4.0–10.3)
WBC: 13.3 10*3/uL — ABNORMAL HIGH (ref 4.0–10.5)
lymph#: 1.1 10*3/uL (ref 0.9–3.3)

## 2017-03-20 LAB — TYPE AND SCREEN
ABO/RH(D): O POS
ANTIBODY SCREEN: NEGATIVE

## 2017-03-20 LAB — I-STAT CHEM 8, ED
BUN: 11 mg/dL (ref 6–20)
CREATININE: 0.8 mg/dL (ref 0.61–1.24)
Calcium, Ion: 1.02 mmol/L — ABNORMAL LOW (ref 1.15–1.40)
Chloride: 102 mmol/L (ref 101–111)
GLUCOSE: 117 mg/dL — AB (ref 65–99)
HCT: 46 % (ref 39.0–52.0)
HEMOGLOBIN: 15.6 g/dL (ref 13.0–17.0)
POTASSIUM: 3.4 mmol/L — AB (ref 3.5–5.1)
Sodium: 140 mmol/L (ref 135–145)
TCO2: 24 mmol/L (ref 22–32)

## 2017-03-20 LAB — PROTIME-INR
INR: 1.04
PROTHROMBIN TIME: 13.5 s (ref 11.4–15.2)

## 2017-03-20 LAB — MAGNESIUM: Magnesium: 2.1 mg/dl (ref 1.5–2.5)

## 2017-03-20 LAB — TROPONIN I: Troponin I: 0.06 ng/mL (ref <0.03)

## 2017-03-20 LAB — CEA (IN HOUSE-CHCC): CEA (CHCC-IN HOUSE): 4.92 ng/mL (ref 0.00–5.00)

## 2017-03-20 MED ORDER — ASPIRIN 81 MG PO CHEW
324.0000 mg | CHEWABLE_TABLET | Freq: Once | ORAL | Status: AC
  Administered 2017-03-20: 324 mg via ORAL
  Filled 2017-03-20: qty 4

## 2017-03-20 MED ORDER — OXALIPLATIN CHEMO INJECTION 100 MG/20ML
92.0000 mg/m2 | Freq: Once | INTRAVENOUS | Status: AC
  Administered 2017-03-20: 200 mg via INTRAVENOUS
  Filled 2017-03-20: qty 40

## 2017-03-20 MED ORDER — SODIUM CHLORIDE 0.9% FLUSH
3.0000 mL | Freq: Two times a day (BID) | INTRAVENOUS | Status: DC
  Administered 2017-03-20: 3 mL via INTRAVENOUS

## 2017-03-20 MED ORDER — PALONOSETRON HCL INJECTION 0.25 MG/5ML
0.2500 mg | Freq: Once | INTRAVENOUS | Status: AC
  Administered 2017-03-20: 0.25 mg via INTRAVENOUS

## 2017-03-20 MED ORDER — SODIUM CHLORIDE 0.9% FLUSH: 3.0000 mL | INTRAVENOUS | Status: DC | PRN

## 2017-03-20 MED ORDER — ONDANSETRON HCL 4 MG PO TABS: 8.0000 mg | ORAL_TABLET | Freq: Two times a day (BID) | ORAL | Status: DC | PRN

## 2017-03-20 MED ORDER — PROCHLORPERAZINE MALEATE 10 MG PO TABS
10.0000 mg | ORAL_TABLET | Freq: Four times a day (QID) | ORAL | Status: DC | PRN
  Filled 2017-03-20: qty 1

## 2017-03-20 MED ORDER — SODIUM CHLORIDE 0.9 % IV SOLN
Freq: Once | INTRAVENOUS | Status: AC
  Administered 2017-03-20: 16:00:00 via INTRAVENOUS
  Filled 2017-03-20: qty 5

## 2017-03-20 MED ORDER — DEXTROSE 5 % IV SOLN
Freq: Once | INTRAVENOUS | Status: AC
  Administered 2017-03-20: 15:00:00 via INTRAVENOUS

## 2017-03-20 MED ORDER — HEPARIN SODIUM (PORCINE) 5000 UNIT/ML IJ SOLN
5000.0000 [IU] | Freq: Three times a day (TID) | INTRAMUSCULAR | Status: DC
  Administered 2017-03-20 – 2017-03-21 (×3): 5000 [IU] via SUBCUTANEOUS
  Filled 2017-03-20 (×3): qty 1

## 2017-03-20 MED ORDER — ASPIRIN EC 81 MG PO TBEC
81.0000 mg | DELAYED_RELEASE_TABLET | Freq: Every day | ORAL | Status: DC
  Administered 2017-03-21: 81 mg via ORAL
  Filled 2017-03-20: qty 1

## 2017-03-20 MED ORDER — SODIUM CHLORIDE 0.9 % IV SOLN: 4.0000 mg | Freq: Once | INTRAVENOUS | Status: DC

## 2017-03-20 MED ORDER — SODIUM CHLORIDE 0.9 % IV SOLN: 250.0000 mL | INTRAVENOUS | Status: DC | PRN

## 2017-03-20 MED ORDER — ALPRAZOLAM 0.25 MG PO TABS
0.2500 mg | ORAL_TABLET | Freq: Three times a day (TID) | ORAL | Status: DC | PRN
Start: 2017-03-20 — End: 2017-03-21

## 2017-03-20 MED ORDER — SODIUM CHLORIDE 0.9% FLUSH
10.0000 mL | INTRAVENOUS | Status: DC | PRN
  Administered 2017-03-20: 10 mL via INTRAVENOUS
  Filled 2017-03-20: qty 10

## 2017-03-20 MED ORDER — HEPARIN SOD (PORK) LOCK FLUSH 100 UNIT/ML IV SOLN
500.0000 [IU] | Freq: Once | INTRAVENOUS | Status: DC | PRN
  Filled 2017-03-20: qty 5

## 2017-03-20 MED ORDER — SODIUM CHLORIDE 0.9% FLUSH
10.0000 mL | INTRAVENOUS | Status: DC | PRN
  Filled 2017-03-20: qty 10

## 2017-03-20 MED ORDER — PALONOSETRON HCL INJECTION 0.25 MG/5ML
INTRAVENOUS | Status: AC
  Filled 2017-03-20: qty 5

## 2017-03-20 MED ORDER — HYDROCODONE-ACETAMINOPHEN 5-325 MG PO TABS: 1.0000 | ORAL_TABLET | Freq: Four times a day (QID) | ORAL | Status: DC | PRN

## 2017-03-20 MED ORDER — MOMETASONE FURO-FORMOTEROL FUM 200-5 MCG/ACT IN AERO
2.0000 | INHALATION_SPRAY | Freq: Two times a day (BID) | RESPIRATORY_TRACT | Status: DC
  Administered 2017-03-20: 2 via RESPIRATORY_TRACT
  Filled 2017-03-20: qty 8.8

## 2017-03-20 NOTE — Progress Notes (Signed)
Prior to chemo infusion pt received Aloxi IV and Emend IV.  During chemo infusion of Oxaliplatin, pt stated coughing hard and then became unresponsive, Oxaliplatin stopped and NS wide open.  Code activated, Dr. Burr Medico notified and CPR started. Epinephrine IM administered in pt left arm and 125 mg IV solumedrol given. Code team arrived and pt was revived and transferred to [facility] Emergency Department.

## 2017-03-20 NOTE — ED Triage Notes (Signed)
Per oncology RN, patient was at cancer center receiving a new dose of medication (see MAR), within 2 minutes he began coughing and agonal breathing, lost pulse, code blue was called compressions were done for 9 minutes. .3mg  IM epi, 50 benadryl, 125 solumedrol given. Pt transferred to ED.

## 2017-03-20 NOTE — H&P (Addendum)
Referring Physician:  Tarun Patchell is an 56 y.o. male.                       Chief Complaint: Syncope  HPI: 56 year old male with 56 year old colon cancer had Oxaliplatin infusion started and in 2 minutes he started coughing hard and became unresponsive. Oxaliplatin was stopped, Wide open NS, 0.3 mg. Epinephrine IM, IV 125 mg. Solumedrol and IV 50 mg. Benadryl were given. He had pulseless electrical activity, CPR was given for 9 minutes and patient has spontaneous circulation restored. He feels well but has weakness. EKG shows sinus tachycardia and global ischemia. He has point tenderness in mid sternal area, probably from CPR.  Past Medical History:  Diagnosis Date  . Anxiety    situational due to cancer diagnosis  . Cancer [facility]) 2017   colon-chemo 09/22/15 now surgery  . Hypercholesteremia   . Kidney calculi       Past Surgical History:  Procedure Laterality Date  . COLONOSCOPY  03/30/15  . LAPAROSCOPIC PARTIAL COLECTOMY N/A 11/02/2015   Procedure: LAPAROSCOPIC ASSISTED SIGMOID COLECTOMY;  Surgeon: Jackolyn Confer, MD;  Location: [facility] ORS;  Service: General;  Laterality: N/A;  . PORTACATH PLACEMENT N/A 05/08/2016   Procedure: INSERTION PORT-A-CATH;  Surgeon: Jackolyn Confer, MD;  Location: [facility] ORS;  Service: General;  Laterality: N/A;    Family History  Problem Relation Age of Onset  . Breast cancer Mother 51       +rad and lymph node  . Diabetes Maternal Grandmother        leading to blindness  . Obesity Maternal Aunt   . Stroke Maternal Uncle 57   Social History:  reports that he has never smoked. He has never used smokeless tobacco. He reports that he does not drink alcohol or use drugs.  Allergies: No Known Allergies   (Not in a hospital admission)  Results for orders placed or performed in visit on 03/20/17 (from the past 48 hour(s))  CBC with Differential     Status: Abnormal   Collection Time: 03/20/17  1:06 PM  Result Value Ref Range   WBC 6.0 4.0 - 10.3 10e3/uL   NEUT# 4.2 1.5 - 6.5 10e3/uL   HGB 13.6 13.0 - 17.1 g/dL   HCT 40.7 38.4 - 49.9 %   Platelets 217 140 - 400 10e3/uL   MCV 86.7 79.3 - 98.0 fL   MCH 29.0 27.2 - 33.4 pg   MCHC 33.5 32.0 - 36.0 g/dL   RBC 4.70 4.20 - 5.82 10e6/uL   RDW 14.7 (H) 11.0 - 14.6 %   lymph# 1.1 0.9 - 3.3 10e3/uL   MONO# 0.5 0.1 - 0.9 10e3/uL   Eosinophils Absolute 0.1 0.0 - 0.5 10e3/uL   Basophils Absolute 0.0 0.0 - 0.1 10e3/uL   NEUT% 70.4 39.0 - 75.0 %   LYMPH% 19.1 14.0 - 49.0 %   MONO% 9.1 0.0 - 14.0 %   EOS% 0.9 0.0 - 7.0 %   BASO% 0.5 0.0 - 2.0 %  Comprehensive metabolic panel     Status: Abnormal   Collection Time: 03/20/17  1:06 PM  Result Value Ref Range   Sodium 139 136 - 145 mEq/L   Potassium 3.7 3.5 - 5.1 mEq/L   Chloride 103 98 - 109 mEq/L   CO2 26 22 - 29 mEq/L   Glucose 148 (H) 70 - 140 mg/dl    Comment: Glucose reference range is for nonfasting patients. Fasting glucose reference  range is 70- 100.   BUN 10.5 7.0 - 26.0 mg/dL   Creatinine 0.8 0.7 - 1.3 mg/dL   Total Bilirubin 0.37 0.20 - 1.20 mg/dL   Alkaline Phosphatase 95 40 - 150 U/L   AST 23 5 - 34 U/L   ALT 33 0 - 55 U/L   Total Protein 7.4 6.4 - 8.3 g/dL   Albumin 3.7 3.5 - 5.0 g/dL   Calcium 9.3 8.4 - 10.4 mg/dL   Anion Gap 10 3 - 11 mEq/L   EGFR >90 >90 ml/min/1.73 m2    Comment: eGFR is calculated using the CKD-EPI Creatinine Equation (2009)  Magnesium     Status: None   Collection Time: 03/20/17  1:06 PM  Result Value Ref Range   Magnesium 2.1 1.5 - 2.5 mg/dl  CEA (IN HOUSE-[facility])     Status: None   Collection Time: 03/20/17  1:06 PM  Result Value Ref Range   CEA ([facility]-In House) 4.92 0.00 - 5.00 ng/mL    Comment: This test was performed using Architect's Chemiluminescent Microparticle Immunoassay. Values obtained from different assay methods cannot be used interchageably. Please note that 5-10% of patients who smoke may see CEA levels up to 6.9 ng/mL.   Dg Chest Portable 1 View  Result Date: 03/20/2017 CLINICAL DATA:   Patient with coughing and shortness of breath. Recent cardiac arrest. EXAM: PORTABLE CHEST 1 VIEW COMPARISON:  CT chest 02/10/2017; chest radiograph 05/08/2016 FINDINGS: Right anterior chest wall Port-A-Cath is present with tip projecting over the superior vena cava. Monitoring leads overlie the patient. Stable enlarged cardiac and mediastinal contours. Peripheral bandlike opacity within the right mid lung. Additional known pulmonary nodules bilaterally are not well demonstrated on current exam. No pleural effusion or pneumothorax. IMPRESSION: Stable cardiomegaly. Unchanged peripheral bandlike opacity within the right mid lung. Electronically Signed   By: Lovey Newcomer M.D.   On: 03/20/2017 17:32    Review Of Systems Constitutional: No fever, chills, weight loss or gain. Eyes: No vision change, wears glasses. No discharge or pain. Ears: No hearing loss, No tinnitus. Respiratory: No asthma, COPD, pneumonias. No shortness of breath. No hemoptysis. Cardiovascular: No chest pain, palpitation, leg edema. Gastrointestinal: No nausea, vomiting, diarrhea, constipation. H/O colon cancer. No hepatitis. Genitourinary: No dysuria, hematuria, kidney stone. No incontinance. Neurological: No headache, stroke, seizures.  Psychiatry: No psych facility admission for anxiety, depression, suicide. No detox. Skin: No rash. Musculoskeletal: No joint pain, fibromyalgia. No neck pain, back pain. Lymphadenopathy: No lymphadenopathy. Hematology: No anemia or easy bruising.   Blood pressure 119/82, pulse 96, temperature (!) 97.5 F (36.4 C), temperature source Oral, resp. rate 18, SpO2 96 %. There is no height or weight on file to calculate BMI. General appearance: alert, cooperative, appears stated age and no distress Head: Normocephalic, atraumatic. Eyes: Brown eyes, pink conjunctiva, corneas clear. PERRL, EOM's intact. Neck: No adenopathy, no carotid bruit, no JVD, supple, symmetrical, trachea midline and thyroid not  enlarged. Resp: Clear to auscultation bilaterally. Cardio: Regular rate and rhythm, S1, S2 normal, II/VI systolic murmur, no click, rub or gallop GI: Soft, non-tender; bowel sounds normal; no organomegaly. Extremities: No edema, cyanosis or clubbing. Skin: Warm and dry.  Neurologic: Alert and oriented X 3, normal strength.  Assessment/Plan Survival of cardiac arrest R/O CAD Adverse effect of Oxaliplatin Colon cancer with metastasis Hyperlipidemia  Place in observation, monitor, R/O MI.  Birdie Riddle, MD  03/20/2017, 5:48 PM

## 2017-03-20 NOTE — ED Notes (Signed)
Pt is aware that a urine sample is needed.  Pt has been resting since Probation officer took over assignment.  RN is aware and sts to let the pt rest.

## 2017-03-20 NOTE — ED Notes (Signed)
Per lab pt need RECOLLECT on blue top. [place], RN made aware.

## 2017-03-20 NOTE — ED Notes (Signed)
Unable to collect lab patient wants nurse to collect labs for port

## 2017-03-20 NOTE — ED Provider Notes (Signed)
[facility] DEPT Provider Note   CSN: [id number] Arrival date & time: 03/20/17  1654     History   Chief Complaint Chief Complaint  Patient presents with  . Post cardiac arrest    HPI Bill Wright is a 56 y.o. male.  HPI  56 year old male with a history of colon cancer currently on chemotherapy presents after CPR. The patient was in the cancer center and was receiving an infusion of Oxaliplatin for 2 minutes and then all of a sudden started coughing and became unresponsive. Patient states that while the medicine was going in he started feeling weak and weird, then or murmurs waking up with multiple people around him. He started having agonal respirations and then after a minute or so stopped breathing altogether. Nurses report there was some foaming at the mouth. Did not see tongue swelling but she thinks she might of seen his lips swollen. He was given 0.3 mg IM epinephrine, 125 mg IV slight Medrol and 50 mg IV Benadryl. CPR was performed because there was no pulse and CPR lasted about 9 minutes. He then slowly recovered and now is back to his normal self. He does state that he has some chest heaviness in the middle of his chest his chest is sore. He does not feel short of breath. He has no headache. He feels diffusely weak and while he has not tried walking feels like he couldn't get up to walk. However he does not feel unilateral weakness. He has not had any recent leg swelling or leg pain.  Past Medical History:  Diagnosis Date  . Anxiety    situational due to cancer diagnosis  . Cancer [facility]) 2017   colon-chemo 09/22/15 now surgery  . Hypercholesteremia   . Kidney calculi     Patient Active Problem List   Diagnosis Date Noted  . Cardiac arrest ([place]) 03/20/2017  . Goals of care, counseling/discussion 07/11/2016  . Port catheter in place 05/23/2016  . Genetic testing 12/06/2015  . Metastasis to lung ([place]) 12/01/2015  . Epigastric abdominal pain 05/19/2015  . Encounter for  chemotherapy management 04/20/2015  . Colon cancer metastasized to lung [facility]) s/p laparoscopic assisted sigmoid colectomy 11/02/15 04/07/2015    Past Surgical History:  Procedure Laterality Date  . COLONOSCOPY  03/30/15  . LAPAROSCOPIC PARTIAL COLECTOMY N/A 11/02/2015   Procedure: LAPAROSCOPIC ASSISTED SIGMOID COLECTOMY;  Surgeon: Jackolyn Confer, MD;  Location: [facility];  Service: General;  Laterality: N/A;  . PORTACATH PLACEMENT N/A 05/08/2016   Procedure: INSERTION PORT-A-CATH;  Surgeon: Jackolyn Confer, MD;  Location: [facility];  Service: General;  Laterality: N/A;       Home Medications    Prior to Admission medications   Medication Sig Start Date End Date Taking? Authorizing Provider  ALPRAZolam (XANAX) 0.25 MG tablet Take 1 tablet (0.25 mg total) by mouth 3 (three) times daily as needed for anxiety. 05/09/16   Truitt Merle, MD  clindamycin (CLINDAGEL) 1 % gel Apply topically 2 (two) times daily. 02/14/17   Truitt Merle, MD  Fluticasone-Salmeterol (ADVAIR DISKUS) 250-50 MCG/DOSE AEPB Inhale 1 puff into the lungs 2 (two) times daily. 01/27/17   Truitt Merle, MD  HYDROcodone-acetaminophen (NORCO/VICODIN) 5-325 MG tablet Take 1 tablet by mouth every 6 (six) hours as needed for moderate pain or severe pain. 02/14/17   Truitt Merle, MD  ibuprofen (ADVIL,MOTRIN) 200 MG tablet Take 600 mg by mouth every 6 (six) hours as needed.    [provider]  lidocaine-prilocaine (EMLA) cream Apply 1 application  topically as needed. Patient not taking: Reported on 02/14/2017 04/25/16   Truitt Merle, MD  mometasone (ELOCON) 0.1 % cream Apply 1 application topically daily. 03/17/17   Truitt Merle, MD  ondansetron (ZOFRAN) 8 MG tablet Take 1 tablet (8 mg total) by mouth 2 (two) times daily as needed for refractory nausea / vomiting. Start on day 3 after chemotherapy. 02/14/17   Truitt Merle, MD  prochlorperazine (COMPAZINE) 10 MG tablet Take 1 tablet (10 mg total) by mouth every 6 (six) hours as needed (NAUSEA). 02/14/17   Truitt Merle, MD  sildenafil (VIAGRA) 100 MG tablet  11/05/16   [provider]  traMADol (ULTRAM) 50 MG tablet Take 1 tablet (50 mg total) by mouth every 6 (six) hours as needed. Patient not taking: Reported on 03/20/2017 01/09/17   Truitt Merle, MD    Family History Family History  Problem Relation Age of Onset  . Breast cancer Mother 26       +rad and lymph node  . Diabetes Maternal Grandmother        leading to blindness  . Obesity Maternal Aunt   . Stroke Maternal Uncle 59    Social History Social History  Substance Use Topics  . Smoking status: Never Smoker  . Smokeless tobacco: Never Used  . Alcohol use No     Allergies   Patient has no known allergies.   Review of Systems Review of Systems  Constitutional: Positive for fatigue.  Respiratory: Negative for shortness of breath.   Cardiovascular: Positive for chest pain.  Gastrointestinal: Negative for nausea and vomiting.  Neurological: Positive for weakness. Negative for headaches.  All other systems reviewed and are negative.    Physical Exam Updated Vital Signs BP 125/72   Pulse 98   Temp (!) 97.5 F (36.4 C) (Oral)   Resp (!) 22   SpO2 100%   Physical Exam  Constitutional: He is oriented to person, place, and time. He appears well-developed and well-nourished.  HENT:  Head: Normocephalic and atraumatic.  Right Ear: External ear normal.  Left Ear: External ear normal.  Nose: Nose normal.  Eyes: Right eye exhibits no discharge. Left eye exhibits no discharge.  Neck: Neck supple.  Cardiovascular: Normal rate, regular rhythm and normal heart sounds.   Pulses:      Radial pulses are 2+ on the right side, and 2+ on the left side.  Pulmonary/Chest: Effort normal and breath sounds normal. He exhibits tenderness (mid chest. no crepitus).  Abdominal: Soft. There is no tenderness.  Musculoskeletal: He exhibits no edema.  Neurological: He is alert and oriented to person, place, and time.  CN 3-12 grossly intact.  5/5 strength in all 4 extremities. Grossly normal sensation. No slurred speech  Skin: Skin is warm and dry.  Nursing note and vitals reviewed.    ED Treatments / Results  Labs (all labs ordered are listed, but only abnormal results are displayed) Labs Reviewed  I-STAT CHEM 8, ED - Abnormal; Notable for the following:       Result Value   Potassium 3.4 (*)    Glucose, Bld 117 (*)    Calcium, Ion 1.02 (*)    All other components within normal limits  COMPREHENSIVE METABOLIC PANEL  TROPONIN I  CBC WITH DIFFERENTIAL/PLATELET  PROTIME-INR  URINALYSIS, ROUTINE W REFLEX MICROSCOPIC  TROPONIN I  TROPONIN I  TROPONIN I  BASIC METABOLIC PANEL  CBC  TYPE AND SCREEN    EKG  EKG Interpretation  Date/Time:  Thursday March 20 2017 17:04:46 EDT Ventricular Rate:  104 PR Interval:    QRS Duration: 78 QT Interval:  343 QTC Calculation: 452 R Axis:   71 Text Interpretation:  Sinus tachycardia Repol abnrm, severe global ischemia (LM/MVD) changes noted compared to Nov 2017 Confirmed by Sherwood Gambler [phone]) on 03/20/2017 5:09:16 PM       Radiology Dg Chest Portable 1 View  Result Date: 03/20/2017 CLINICAL DATA:  Patient with coughing and shortness of breath. Recent cardiac arrest. EXAM: PORTABLE CHEST 1 VIEW COMPARISON:  CT chest 02/10/2017; chest radiograph 05/08/2016 FINDINGS: Right anterior chest wall Port-A-Cath is present with tip projecting over the superior vena cava. Monitoring leads overlie the patient. Stable enlarged cardiac and mediastinal contours. Peripheral bandlike opacity within the right mid lung. Additional known pulmonary nodules bilaterally are not well demonstrated on current exam. No pleural effusion or pneumothorax. IMPRESSION: Stable cardiomegaly. Unchanged peripheral bandlike opacity within the right mid lung. Electronically Signed   By: Lovey Newcomer M.D.   On: 03/20/2017 17:32    Procedures Procedures (including critical care time)  Medications Ordered  in ED Medications  heparin injection 5,000 Units (not administered)  sodium chloride flush (NS) 0.9 % injection 3 mL (not administered)  sodium chloride flush (NS) 0.9 % injection 3 mL (not administered)  0.9 %  sodium chloride infusion (not administered)  aspirin EC tablet 81 mg (81 mg Oral Not Given 03/20/17 1807)  ALPRAZolam (XANAX) tablet 0.25 mg (not administered)  mometasone-formoterol (DULERA) 200-5 MCG/ACT inhaler 2 puff (not administered)  HYDROcodone-acetaminophen (NORCO/VICODIN) 5-325 MG per tablet 1 tablet (not administered)  ondansetron (ZOFRAN) tablet 8 mg (not administered)  prochlorperazine (COMPAZINE) tablet 10 mg (not administered)  aspirin chewable tablet 324 mg (324 mg Oral Given 03/20/17 1727)     Initial Impression / Assessment and Plan / ED Course  I have reviewed the triage vital signs and the nursing notes.  Pertinent labs & imaging results that were available during my care of the patient were reviewed by me and considered in my medical decision making (see chart for details).     Patient's incident today sounds like an reaction to the medicine he was given during chemotherapy. Possible anaphylaxis and he might have just been very hypotensive. However given there were no pulses felt, he seems to have appropriately been treated for PEA arrest. However now he is awake, alert, and appropriate. There appears to be no focal neuro deficits. He has chest pain, unclear if this is cardiac or related to having CPR for 9 minutes. He is reproducibly tender. His ECG shows diffuse ST/T depressions but no elevations. This will need monitoring. I discussed with cardiology, Dr. Doylene Canard, who will come evaluate the patient and admit. I think PE is unlikely and do not feel workup is needed at this time. He will need troponins evaluated and likely further evaluation of CAD.  Final Clinical Impressions(s) / ED Diagnoses   Final diagnoses:  Cardiac arrest [facility])    New  Prescriptions New Prescriptions   No medications on file     Sherwood Gambler, MD 03/20/17 1810

## 2017-03-20 NOTE — ED Notes (Signed)
Bed: WA16 Expected date:  Expected time:  Means of arrival:  Comments: Res A 

## 2017-03-20 NOTE — Patient Instructions (Signed)
Cancer Center Discharge Instructions for Patients Receiving Chemotherapy  Today you received the following chemotherapy agents:  Oxaliplatin  To help prevent nausea and vomiting after your treatment, we encourage you to take your nausea medication as prescribed.   If you develop nausea and vomiting that is not controlled by your nausea medication, call the clinic.   BELOW ARE SYMPTOMS THAT SHOULD BE REPORTED IMMEDIATELY:  *FEVER GREATER THAN 100.5 F  *CHILLS WITH OR WITHOUT FEVER  NAUSEA AND VOMITING THAT IS NOT CONTROLLED WITH YOUR NAUSEA MEDICATION  *UNUSUAL SHORTNESS OF BREATH  *UNUSUAL BRUISING OR BLEEDING  TENDERNESS IN MOUTH AND THROAT WITH OR WITHOUT PRESENCE OF ULCERS  *URINARY PROBLEMS  *BOWEL PROBLEMS  UNUSUAL RASH Items with * indicate a potential emergency and should be followed up as soon as possible.  Feel free to call the clinic you have any questions or concerns. The clinic phone number is [phone].  Please show the CHEMO ALERT CARD at check-in to the Emergency Department and triage nurse.   

## 2017-03-21 ENCOUNTER — Encounter: Payer: Self-pay | Admitting: General Practice

## 2017-03-21 ENCOUNTER — Other Ambulatory Visit: Payer: Self-pay

## 2017-03-21 ENCOUNTER — Encounter (HOSPITAL_COMMUNITY)
Admission: EM | Disposition: A | Discharge status: Home / Self Care | Payer: Self-pay | Source: Home / Self Care | Attending: Emergency Medicine

## 2017-03-21 ENCOUNTER — Encounter (HOSPITAL_COMMUNITY): Payer: Self-pay | Admitting: Cardiovascular Disease

## 2017-03-21 HISTORY — PX: LEFT HEART CATH AND CORONARY ANGIOGRAPHY: CATH118249

## 2017-03-21 LAB — URINALYSIS, ROUTINE W REFLEX MICROSCOPIC
BACTERIA UA: NONE SEEN
BILIRUBIN URINE: NEGATIVE
Glucose, UA: >=500 mg/dL — AB
Hgb urine dipstick: NEGATIVE
Ketones, ur: 5 mg/dL — AB
Leukocytes, UA: NEGATIVE
Nitrite: NEGATIVE
Protein, ur: 30 mg/dL — AB
SPECIFIC GRAVITY, URINE: 1.028 (ref 1.005–1.030)
pH: 5 (ref 5.0–8.0)

## 2017-03-21 LAB — BASIC METABOLIC PANEL
ANION GAP: 10 (ref 5–15)
BUN: 14 mg/dL (ref 6–20)
CHLORIDE: 104 mmol/L (ref 101–111)
CO2: 22 mmol/L (ref 22–32)
Calcium: 8.7 mg/dL — ABNORMAL LOW (ref 8.9–10.3)
Creatinine, Ser: 0.76 mg/dL (ref 0.61–1.24)
GFR calc Af Amer: >60 mL/min (ref >60)
GFR calc non Af Amer: >60 mL/min (ref >60)
GLUCOSE: 176 mg/dL — AB (ref 65–99)
POTASSIUM: 4.4 mmol/L (ref 3.5–5.1)
Sodium: 136 mmol/L (ref 135–145)

## 2017-03-21 LAB — CBC
HEMATOCRIT: 40.8 % (ref 39.0–52.0)
HEMOGLOBIN: 13.5 g/dL (ref 13.0–17.0)
MCH: 28.6 pg (ref 26.0–34.0)
MCHC: 33.1 g/dL (ref 30.0–36.0)
MCV: 86.4 fL (ref 78.0–100.0)
Platelets: 234 10*3/uL (ref 150–400)
RBC: 4.72 MIL/uL (ref 4.22–5.81)
RDW: 14.2 % (ref 11.5–15.5)
WBC: 12.9 10*3/uL — ABNORMAL HIGH (ref 4.0–10.5)

## 2017-03-21 LAB — TROPONIN I
Troponin I: 0.36 ng/mL (ref <0.03)
Troponin I: 0.24 ng/mL (ref <0.03)

## 2017-03-21 LAB — CBG MONITORING, ED: Glucose-Capillary: 170 mg/dL — ABNORMAL HIGH (ref 65–99)

## 2017-03-21 LAB — TSH: TSH: 0.924 u[IU]/mL (ref 0.350–4.500)

## 2017-03-21 SURGERY — LEFT HEART CATH AND CORONARY ANGIOGRAPHY
Anesthesia: LOCAL

## 2017-03-21 MED ORDER — METOPROLOL TARTRATE 25 MG PO TABS
25.0000 mg | ORAL_TABLET | Freq: Two times a day (BID) | ORAL | Status: DC
  Administered 2017-03-21: 25 mg via ORAL
  Filled 2017-03-21: qty 1

## 2017-03-21 MED ORDER — MIDAZOLAM HCL 2 MG/2ML IJ SOLN
INTRAMUSCULAR | Status: DC | PRN
  Administered 2017-03-21: 1 mg via INTRAVENOUS

## 2017-03-21 MED ORDER — SODIUM CHLORIDE 0.9 % IV SOLN
INTRAVENOUS | Status: AC
  Administered 2017-03-21: 13:00:00 via INTRAVENOUS

## 2017-03-21 MED ORDER — ONDANSETRON HCL 4 MG/2ML IJ SOLN: 4.0000 mg | Freq: Four times a day (QID) | INTRAMUSCULAR | Status: DC | PRN

## 2017-03-21 MED ORDER — IOPAMIDOL (ISOVUE-370) INJECTION 76%
INTRAVENOUS | Status: DC | PRN
  Administered 2017-03-21: 55 mL via INTRA_ARTERIAL

## 2017-03-21 MED ORDER — SODIUM CHLORIDE 0.9% FLUSH: 3.0000 mL | Freq: Two times a day (BID) | INTRAVENOUS | Status: DC

## 2017-03-21 MED ORDER — SODIUM CHLORIDE 0.9% FLUSH: 3.0000 mL | INTRAVENOUS | Status: DC | PRN

## 2017-03-21 MED ORDER — POTASSIUM CHLORIDE CRYS ER 20 MEQ PO TBCR
20.0000 meq | EXTENDED_RELEASE_TABLET | Freq: Two times a day (BID) | ORAL | Status: DC
Start: 2017-03-21 — End: 2017-03-21
  Administered 2017-03-21: 20 meq via ORAL
  Filled 2017-03-21: qty 1

## 2017-03-21 MED ORDER — LIDOCAINE HCL 2 % IJ SOLN
INTRAMUSCULAR | Status: AC
  Filled 2017-03-21: qty 10

## 2017-03-21 MED ORDER — METOPROLOL TARTRATE 25 MG PO TABS: 25.0000 mg | ORAL_TABLET | Freq: Two times a day (BID) | ORAL | 3 refills | Status: AC

## 2017-03-21 MED ORDER — HEPARIN (PORCINE) IN NACL 2-0.9 UNIT/ML-% IJ SOLN
INTRAMUSCULAR | Status: AC
  Filled 2017-03-21: qty 1000

## 2017-03-21 MED ORDER — IOPAMIDOL (ISOVUE-370) INJECTION 76%
INTRAVENOUS | Status: AC
  Filled 2017-03-21: qty 100

## 2017-03-21 MED ORDER — DEXTROSE 5 % IV SOLN
2.0000 g | Freq: Every day | INTRAVENOUS | Status: DC
  Administered 2017-03-21: 2 g via INTRAVENOUS
  Filled 2017-03-21 (×2): qty 2

## 2017-03-21 MED ORDER — FENTANYL CITRATE (PF) 100 MCG/2ML IJ SOLN
INTRAMUSCULAR | Status: DC | PRN
  Administered 2017-03-21: 25 ug via INTRAVENOUS

## 2017-03-21 MED ORDER — SODIUM CHLORIDE 0.9 % IV SOLN: 250.0000 mL | INTRAVENOUS | Status: DC | PRN

## 2017-03-21 MED ORDER — HEPARIN (PORCINE) IN NACL 2-0.9 UNIT/ML-% IJ SOLN
INTRAMUSCULAR | Status: AC | PRN
  Administered 2017-03-21: 1000 mL

## 2017-03-21 MED ORDER — ACETAMINOPHEN 325 MG PO TABS: 650.0000 mg | ORAL_TABLET | ORAL | Status: DC | PRN

## 2017-03-21 MED ORDER — MIDAZOLAM HCL 2 MG/2ML IJ SOLN
INTRAMUSCULAR | Status: AC
  Filled 2017-03-21: qty 2

## 2017-03-21 MED ORDER — POTASSIUM CHLORIDE IN NACL 20-0.9 MEQ/L-% IV SOLN
INTRAVENOUS | Status: DC
  Administered 2017-03-21: 06:00:00 via INTRAVENOUS
  Filled 2017-03-21 (×3): qty 1000

## 2017-03-21 MED ORDER — FENTANYL CITRATE (PF) 100 MCG/2ML IJ SOLN
INTRAMUSCULAR | Status: AC
  Filled 2017-03-21: qty 2

## 2017-03-21 MED ORDER — POTASSIUM CHLORIDE CRYS ER 10 MEQ PO TBCR: 10.0000 meq | EXTENDED_RELEASE_TABLET | Freq: Every day | ORAL | 3 refills | Status: DC

## 2017-03-21 MED ORDER — LIDOCAINE HCL (PF) 1 % IJ SOLN
INTRAMUSCULAR | Status: DC | PRN
  Administered 2017-03-21: 16 mL via INTRADERMAL

## 2017-03-21 MED ORDER — CEPHALEXIN 500 MG PO CAPS: 500.0000 mg | ORAL_CAPSULE | Freq: Three times a day (TID) | ORAL | 0 refills | Status: DC

## 2017-03-21 SURGICAL SUPPLY — 7 items
CATH INFINITI 5FR MULTPACK ANG (CATHETERS) ×2 IMPLANT
KIT HEART LEFT (KITS) ×2 IMPLANT
PACK CARDIAC CATHETERIZATION (CUSTOM PROCEDURE TRAY) ×2 IMPLANT
SHEATH PINNACLE 5F 10CM (SHEATH) ×2 IMPLANT
SYR MEDRAD MARK V 150ML (SYRINGE) ×2 IMPLANT
TRANSDUCER W/STOPCOCK (MISCELLANEOUS) ×2 IMPLANT
WIRE EMERALD 3MM-J .035X150CM (WIRE) ×2 IMPLANT

## 2017-03-21 NOTE — Progress Notes (Signed)
Chaplain was referred to this patient for a f/u visit after an event at [facility].  Patient is sitting up in bed feeling fine.  Patient explains what he remembers happening and is hoping to also get some details from those that were with him at [facility].  Patient has a wife and three children and says he doesn't worry about his illness.  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  Patient says out of all that happened, he is fine, he further states that he is not in any pain and feels pretty good.  He will be returning but he states the doctors do not know what how to move forward.  He says no one can explain what happened.  Patient continuing to make meaning of the events of yesterday.  Patient would like me to find his [facility] pen that was in his pocket and his car keys which were at [place] at [employer].     03/21/17 1416  Clinical Encounter Type  Visited With Patient  Visit Type Follow-up;Psychological support;Spiritual support;Social support;Code  Referral From Chaplain (Referral from [place])  Consult/Referral To Chaplain

## 2017-03-21 NOTE — ED Notes (Signed)
Provider cardio in room

## 2017-03-21 NOTE — Interval H&P Note (Signed)
History and Physical Interval Note:  03/21/2017 8:33 AM  Bill Wright  has presented today for surgery, with the diagnosis of cardiac arrrest  The various methods of treatment have been discussed with the patient and family. After consideration of risks, benefits and other options for treatment, the patient has consented to  Procedure(s): LEFT HEART CATH AND CORONARY ANGIOGRAPHY (N/A) as a surgical intervention .  The patient's history has been reviewed, patient examined, no change in status, stable for surgery.  I have reviewed the patient's chart and labs.  Questions were answered to the patient's satisfaction.     Delmont Prosch S

## 2017-03-21 NOTE — ED Notes (Signed)
Date and time results received: 03/21/17 0125  Test:Troponin Critical Value: 0.36  Name of Provider Notified:Dr. Markham Jordan mail left Orders Received? Or Actions Taken?:

## 2017-03-21 NOTE — ED Notes (Signed)
Dr. Merrilee Jansky on call number called and message left about positive elevated troponin

## 2017-03-21 NOTE — ED Notes (Signed)
CBG 170 

## 2017-03-21 NOTE — Progress Notes (Signed)
Ref: Koirala, Dibas, MD   Subjective:  Chest pain with sweating spell. CBG is OK, no hypoglycemia. Patient had Cisplatin in past without reaction. Troponin is mildly elevated post 9 minutes of CPR.  Objective:  Vital Signs in the last 24 hours: Temp:  [97.5 F (36.4 C)-98.6 F (37 C)] 97.5 F (36.4 C) (09/13 1711) Pulse Rate:  [94-114] 109 (09/14 0230) Resp:  [9-27] 19 (09/14 0230) BP: (107-155)/(68-101) 132/76 (09/14 0230) SpO2:  [94 %-100 %] 98 % (09/14 0230) Weight:  [101.6 kg (223 lb 14.4 oz)] 101.6 kg (223 lb 14.4 oz) (09/13 1450)  Physical Exam: BP Readings from Last 1 Encounters:  03/21/17 132/76    Wt Readings from Last 1 Encounters:  03/20/17 101.6 kg (223 lb 14.4 oz)    Weight change:  There is no height or weight on file to calculate BMI. HEENT: [place]/AT, Eyes-Brown, PERL, EOMI, Conjunctiva-Pink, Sclera-Non-icteric Neck: No JVD, No bruit, Trachea midline. Lungs:  Clear, Bilateral. Cardiac:  Regular rhythm, normal S1 and S2, no S3. II/VI systolic murmur. Abdomen:  Soft, non-tender. BS present. Extremities:  No edema present. No cyanosis. No clubbing. CNS: AxOx3, Cranial nerves grossly intact, moves all 4 extremities.  Skin: Warm and dry.   Intake/Output from previous day: 09/13 0701 - 09/14 0700 In: 3 [I.V.:3] Out: -     Lab Results: BMET    Component Value Date/Time   NA 140 03/20/2017 1757   NA 137 03/20/2017 1745   NA 139 03/20/2017 1306   NA 139 02/27/2017 1301   NA 139 02/10/2017 1106   K 3.4 (L) 03/20/2017 1757   K 3.3 (L) 03/20/2017 1745   K 3.7 03/20/2017 1306   K 4.1 02/27/2017 1301   K 4.0 02/10/2017 1106   CL 102 03/20/2017 1757   CL 104 03/20/2017 1745   CL 103 11/03/2015 0354   CO2 22 03/20/2017 1745   CO2 26 03/20/2017 1306   CO2 29 02/27/2017 1301   CO2 26 02/10/2017 1106   GLUCOSE 117 (H) 03/20/2017 1757   GLUCOSE 116 (H) 03/20/2017 1745   GLUCOSE 148 (H) 03/20/2017 1306   GLUCOSE 120 02/27/2017 1301   GLUCOSE 94 02/10/2017 1106    BUN 11 03/20/2017 1757   BUN 12 03/20/2017 1745   BUN 10.5 03/20/2017 1306   BUN 11.9 02/27/2017 1301   BUN 14.1 02/10/2017 1106   CREATININE 0.80 03/20/2017 1757   CREATININE 0.96 03/20/2017 1745   CREATININE 0.8 03/20/2017 1306   CREATININE 1.0 02/27/2017 1301   CREATININE 0.9 02/10/2017 1106   CALCIUM 8.5 (L) 03/20/2017 1745   CALCIUM 9.3 03/20/2017 1306   CALCIUM 9.3 02/27/2017 1301   CALCIUM 9.5 02/10/2017 1106   GFRNONAA >60 03/20/2017 1745   GFRNONAA >60 11/03/2015 0354   GFRNONAA >60 10/31/2015 1330   GFRAA >60 03/20/2017 1745   GFRAA >60 11/03/2015 0354   GFRAA >60 10/31/2015 1330   CBC    Component Value Date/Time   WBC 13.3 (H) 03/20/2017 1745   RBC 5.22 03/20/2017 1745   HGB 15.6 03/20/2017 1757   HGB 13.6 03/20/2017 1306   HCT 46.0 03/20/2017 1757   HCT 40.7 03/20/2017 1306   PLT 251 03/20/2017 1745   PLT 217 03/20/2017 1306   MCV 85.8 03/20/2017 1745   MCV 86.7 03/20/2017 1306   MCH 28.9 03/20/2017 1745   MCHC 33.7 03/20/2017 1745   RDW 14.2 03/20/2017 1745   RDW 14.7 (H) 03/20/2017 1306   LYMPHSABS 2.2 03/20/2017 1745  LYMPHSABS 1.1 03/20/2017 1306   MONOABS 0.6 03/20/2017 1745   MONOABS 0.5 03/20/2017 1306   EOSABS 0.1 03/20/2017 1745   EOSABS 0.1 03/20/2017 1306   BASOSABS 0.0 03/20/2017 1745   BASOSABS 0.0 03/20/2017 1306   HEPATIC Function Panel  Recent Labs  02/27/17 1301 03/20/17 1306 03/20/17 1745  PROT 7.3 7.4 6.7   HEMOGLOBIN A1C No components found for: HGA1C,  MPG CARDIAC ENZYMES Lab Results  Component Value Date   TROPONINI 0.36 (HH) 03/21/2017   TROPONINI 0.06 (HH) 03/20/2017   BNP No results for input(s): PROBNP in the last 8760 hours. TSH No results for input(s): TSH in the last 8760 hours. CHOLESTEROL No results for input(s): CHOL in the last 8760 hours.  Scheduled Meds: . aspirin EC  81 mg Oral Daily  . heparin  5,000 Units Subcutaneous Q8H  . mometasone-formoterol  2 puff Inhalation BID  . sodium chloride  flush  3 mL Intravenous Q12H   Continuous Infusions: . sodium chloride    . cefTRIAXone (ROCEPHIN)  IV     PRN Meds:.sodium chloride, ALPRAZolam, HYDROcodone-acetaminophen, ondansetron, prochlorperazine, sodium chloride flush  Assessment/Plan: Chest pain S/P cardiac arrest Colon cancer with mets Hyperlipidemia  Cardiac catheterization IV fluid with KCl   LOS: 0 days    Dixie Dials  MD  03/21/2017, 3:08 AM

## 2017-03-21 NOTE — ED Notes (Signed)
Spoke with cardiology provider asked for cbg  Provider on the way

## 2017-03-21 NOTE — Progress Notes (Signed)
[facility] Spiritual Care Note  [facility] Chaplain Jettie Pagan responded to code in [facility] infusion room yesterday.  Per his referral, I have contacted [place]/MDiv to f/u with pt and family today.  Spiritual Care at [facility] is able to follow pt long-term as desired.  In the meantime, please page as needs arise.  Thank you.   [place], [place], [facility] M-F daytime pager [phone] [facility] 24/7 pager [phone]

## 2017-03-21 NOTE — Progress Notes (Signed)
Pt leaves cath lab holding area in stable condition. Rt groin is unremarkable. Dressing is CDI. Pt understands post sheath pull instructions.

## 2017-03-21 NOTE — Progress Notes (Signed)
Site area: Rt fem Art Site Prior to Removal:  Level 0 Pressure Applied For: 20min Manual:   yes Patient Status During Pull:  A/O Post Pull Site:  Level 0 Post Pull Instructions Given:  Instructions given and pt understands Post Pull Pulses Present: 2+ Rt Dp Dressing Applied:  4x4 and a tegaderm Bedrest begins @ 10:05:00 Comments: Pt is alert and oriented. No pain. Has contacted wife by phone to update. Rt groin unremarkable.

## 2017-03-21 NOTE — Progress Notes (Signed)
Flushed port and flushes well but still there was no blood return noted. Notified ED RN.

## 2017-03-21 NOTE — Discharge Summary (Signed)
Physician Discharge Summary  Patient ID: Bill Wright MRN: [id number] DOB/AGE: [birth year] 56 y.o.  Admit date: 03/20/2017 Discharge date: 03/21/2017  Admission Diagnoses: Survival of cardiac arrest R/O CAD Adverse effect of Oxaliplatin Colon cancer with metastasis Hyperlipidemia  Discharge Diagnoses:  Principle Problems: * Cardiac arrest ([name]) * Active problems:   Chest pain, musculoskeletal   Adverse effects of Oxaliplatin   Early bronchitis   Colon cancer with pulmonary mets   Hyperlipidemia   Obesity   Sinus tachycardia   Hypokalemia   Hyperglycemia  Discharged Condition: fair  Hospital Course: 56 year old male with a two-year history of colon cancer with metastases had prior treatment with the chemotherapy and radiation therapy including oxaliplatin infusion had hard cough followed by unresponsiveness within 2 minutes of oxaliplatin infusion yesterday. He responded to 0.3 mg epinephrine IM, 125 mg of Solu-Medrol IV and 50 mg of Benadryl IV along with CPR for 9 minutes. Spontaneous circulation was established and EKG showed sinus tachycardia with global ischemia. Patient underwent cardiac catheterization due to complaint of recurrent chest pain and minimally elevated troponin I. He had normal coronaries.  His sinus tachycardia responded to small dose of by mouth metoprolol 25 mg twice daily.  His harsh cough and elevated WBC count were treated with the IV Rocephin followed by by mouth Keflex. He was discharged home in stable condition with follow-up by me in 1 week and by primary care physician in one month.  Consults: cardiology  Significant Diagnostic Studies: labs: Near normal CBC with a mildly elevated WBC count. Troponin-I was 0.06 to 0.35 ng. Mild hyperglycemia, aggravated by solumedrol use.   EKG: Sinus tachycardia with global ischemia.  Chest x-ray: Stable cardiomegaly. Bandlike opacity within right mid lung.  Cardiac cath : Normal coronaries and LV systolic  function.  Treatments: cardiac meds: metoprolol and potassium.  Discharge Exam: Blood pressure 140/73, pulse (!) 106, temperature 97.9 F (36.6 C), temperature source Oral, resp. rate 19, SpO2 99 %. General appearance: alert, cooperative and appears stated age. Head: Normocephalic, atraumatic. Eyes: Brown eyes, pink conjunctiva, corneas clear. PERRL, EOM's intact.  Neck: No adenopathy, no carotid bruit, no JVD, supple, symmetrical, trachea midline and thyroid not enlarged. Resp: Harsh to auscultation bilaterally. Cardio: Regular rate and rhythm, S1, S2 normal, II/VI systolic murmur, no click, rub or gallop. GI: Soft, non-tender; bowel sounds normal; no organomegaly. Extremities: No edema, cyanosis or clubbing. Skin: Warm and dry.  Neurologic: Alert and oriented X 3, normal strength and tone. Normal coordination and gait.  Disposition: 01-Home or Self Care   Allergies as of 03/21/2017      Reactions   Oxaliplatin Anaphylaxis   Patient experienced cardiac arrest while receiving medication IV Oxaliplatin.    Med Rec must be completed prior to using this [facility]      Discharge Care Instructions        Start     Ordered   03/21/17 0000  metoprolol tartrate (LOPRESSOR) 25 MG tablet  2 times daily     03/21/17 1836   03/21/17 0000  potassium chloride SA (K-DUR,KLOR-CON) 10 MEQ tablet  Daily     03/21/17 1836   03/21/17 0000  cephALEXin (KEFLEX) 500 MG capsule  3 times daily     03/21/17 1836     Follow-up Information    Koirala, Dibas, MD. Schedule an appointment as soon as possible for a visit in 1 month(s).   Specialty:  Family Medicine Contact information: [place] [address] [phone]  Dixie Dials, MD. Schedule an appointment as soon as possible for a visit in 1 week(s).   Specialty:  Cardiology Contact information: [place] [address] [phone]           Signed: Birdie Riddle 03/21/2017, 6:37 PM

## 2017-03-24 ENCOUNTER — Telehealth: Payer: Self-pay | Admitting: Hematology

## 2017-03-24 NOTE — Telephone Encounter (Signed)
Per Dr. Burr Medico - no need to schedule 9/13 los - sch message to be sent -

## 2017-03-26 ENCOUNTER — Emergency Department (HOSPITAL_COMMUNITY)
Admission: EM | Admit: 2017-03-26 | Discharge: 2017-03-26 | Disposition: A | Discharge status: Home / Self Care | Payer: BLUE CROSS/BLUE SHIELD | Attending: Emergency Medicine | Admitting: Emergency Medicine

## 2017-03-26 ENCOUNTER — Telehealth: Payer: Self-pay | Admitting: Hematology

## 2017-03-26 ENCOUNTER — Encounter (HOSPITAL_COMMUNITY): Payer: Self-pay | Admitting: *Deleted

## 2017-03-26 DIAGNOSIS — Z79899 Other long term (current) drug therapy: Secondary | ICD-10-CM | POA: Diagnosis not present

## 2017-03-26 DIAGNOSIS — R7881 Bacteremia: Secondary | ICD-10-CM

## 2017-03-26 DIAGNOSIS — R799 Abnormal finding of blood chemistry, unspecified: Secondary | ICD-10-CM | POA: Diagnosis present

## 2017-03-26 LAB — CBC WITH DIFFERENTIAL/PLATELET
Basophils Absolute: 0 10*3/uL (ref 0.0–0.1)
Basophils Relative: 0 %
EOS PCT: 1 %
Eosinophils Absolute: 0.1 10*3/uL (ref 0.0–0.7)
HCT: 44.6 % (ref 39.0–52.0)
Hemoglobin: 13.9 g/dL (ref 13.0–17.0)
LYMPHS ABS: 1.6 10*3/uL (ref 0.7–4.0)
LYMPHS PCT: 20 %
MCH: 27.4 pg (ref 26.0–34.0)
MCHC: 31.2 g/dL (ref 30.0–36.0)
MCV: 87.8 fL (ref 78.0–100.0)
MONO ABS: 0.7 10*3/uL (ref 0.1–1.0)
Monocytes Relative: 9 %
Neutro Abs: 5.4 10*3/uL (ref 1.7–7.7)
Neutrophils Relative %: 70 %
PLATELETS: 198 10*3/uL (ref 150–400)
RBC: 5.08 MIL/uL (ref 4.22–5.81)
RDW: 14.6 % (ref 11.5–15.5)
WBC: 7.8 10*3/uL (ref 4.0–10.5)

## 2017-03-26 LAB — BLOOD CULTURE ID PANEL (REFLEXED)
Acinetobacter baumannii: NOT DETECTED
CANDIDA TROPICALIS: NOT DETECTED
CARBAPENEM RESISTANCE: NOT DETECTED
Candida albicans: NOT DETECTED
Candida glabrata: NOT DETECTED
Candida krusei: NOT DETECTED
Candida parapsilosis: NOT DETECTED
ENTEROCOCCUS SPECIES: NOT DETECTED
Enterobacter cloacae complex: NOT DETECTED
Enterobacteriaceae species: NOT DETECTED
Escherichia coli: NOT DETECTED
HAEMOPHILUS INFLUENZAE: NOT DETECTED
Klebsiella oxytoca: NOT DETECTED
Klebsiella pneumoniae: NOT DETECTED
LISTERIA MONOCYTOGENES: NOT DETECTED
METHICILLIN RESISTANCE: NOT DETECTED
Neisseria meningitidis: NOT DETECTED
PROTEUS SPECIES: NOT DETECTED
Pseudomonas aeruginosa: NOT DETECTED
SERRATIA MARCESCENS: NOT DETECTED
STREPTOCOCCUS PYOGENES: NOT DETECTED
Staphylococcus aureus (BCID): NOT DETECTED
Staphylococcus species: NOT DETECTED
Streptococcus agalactiae: NOT DETECTED
Streptococcus pneumoniae: NOT DETECTED
Streptococcus species: NOT DETECTED
VANCOMYCIN RESISTANCE: NOT DETECTED

## 2017-03-26 LAB — COMPREHENSIVE METABOLIC PANEL
ALT: 35 U/L (ref 17–63)
AST: 21 U/L (ref 15–41)
Albumin: 3.6 g/dL (ref 3.5–5.0)
Alkaline Phosphatase: 74 U/L (ref 38–126)
Anion gap: 12 (ref 5–15)
BILIRUBIN TOTAL: 0.6 mg/dL (ref 0.3–1.2)
BUN: 12 mg/dL (ref 6–20)
CO2: 20 mmol/L — ABNORMAL LOW (ref 22–32)
Calcium: 8.7 mg/dL — ABNORMAL LOW (ref 8.9–10.3)
Chloride: 103 mmol/L (ref 101–111)
Creatinine, Ser: 0.67 mg/dL (ref 0.61–1.24)
GFR calc Af Amer: >60 mL/min (ref >60)
Glucose, Bld: 113 mg/dL — ABNORMAL HIGH (ref 65–99)
POTASSIUM: 4 mmol/L (ref 3.5–5.1)
Sodium: 135 mmol/L (ref 135–145)
TOTAL PROTEIN: 6.7 g/dL (ref 6.5–8.1)

## 2017-03-26 MED ORDER — HEPARIN SOD (PORK) LOCK FLUSH 100 UNIT/ML IV SOLN
500.0000 [IU] | INTRAVENOUS | Status: AC | PRN
  Administered 2017-03-26: 500 [IU]

## 2017-03-26 MED ORDER — SULFAMETHOXAZOLE-TRIMETHOPRIM 800-160 MG PO TABS: 1.0000 | ORAL_TABLET | Freq: Two times a day (BID) | ORAL | 0 refills | Status: AC

## 2017-03-26 MED ORDER — SULFAMETHOXAZOLE-TRIMETHOPRIM 800-160 MG PO TABS
1.0000 | ORAL_TABLET | Freq: Once | ORAL | Status: AC
  Administered 2017-03-26: 1 via ORAL
  Filled 2017-03-26: qty 1

## 2017-03-26 NOTE — ED Notes (Signed)
Pt has no complaints, spoke with dr Sabra Heck regarding labs and changed acuity to 4.

## 2017-03-26 NOTE — ED Triage Notes (Signed)
Family reports that pt went into cardiac arrest last week while getting chemo. Had blood work done and was told by pcp to come back today due to +blood cultures. Pt has no complaints, VSS at triage.

## 2017-03-26 NOTE — ED Notes (Signed)
IV team at bedside 

## 2017-03-26 NOTE — ED Provider Notes (Signed)
[place] DEPT Provider Note   CSN: [id number] Arrival date & time: 03/26/17  3664   History   Chief Complaint Chief Complaint  Patient presents with  . Abnormal Lab    HPI Bill Wright is a 56 y.o. male.  HPI   Patient comes to the ER as recommended for having a positive blood culture with gram positive rods on day 5. He was admitted last week after a cardiac arrest while receiving chemotherapy for his colon cancer with pulmonary metastasis due to adverse reaction to Oxaplatin. Had a coronary catheterization with normal coronaries. He had an elevated WBC therefore was treated with IV Rocephin and then Keflex PO.  Since discharge he has been feeling somewhat fatigued but overall doing well. He has not had fevers. He feels like the Metoprolol 25 mg BID makes him a bit sleepy or that it is not working. He says the Keflex is upsetting his stomach. He see's cardiology tomorrow for follow-up.     Past Medical History:  Diagnosis Date  . Anxiety    situational due to cancer diagnosis  . Cancer [facility]) 2017   colon-chemo 09/22/15 now surgery  . Hypercholesteremia   . Kidney calculi     Patient Active Problem List   Diagnosis Date Noted  . Cardiac arrest ([place]) 03/20/2017  . Goals of care, counseling/discussion 07/11/2016  . Port catheter in place 05/23/2016  . Genetic testing 12/06/2015  . Metastasis to lung ([place]) 12/01/2015  . Epigastric abdominal pain 05/19/2015  . Encounter for chemotherapy management 04/20/2015  . Colon cancer metastasized to lung [facility]) s/p laparoscopic assisted sigmoid colectomy 11/02/15 04/07/2015    Past Surgical History:  Procedure Laterality Date  . COLONOSCOPY  03/30/15  . LAPAROSCOPIC PARTIAL COLECTOMY N/A 11/02/2015   Procedure: LAPAROSCOPIC ASSISTED SIGMOID COLECTOMY;  Surgeon: Jackolyn Confer, MD;  Location: [facility] ORS;  Service: General;  Laterality: N/A;  . LEFT HEART CATH AND CORONARY ANGIOGRAPHY N/A 03/21/2017   Procedure: LEFT HEART CATH AND  CORONARY ANGIOGRAPHY;  Surgeon: Dixie Dials, MD;  Location: [place] CV LAB;  Service: Cardiovascular;  Laterality: N/A;  . PORTACATH PLACEMENT N/A 05/08/2016   Procedure: INSERTION PORT-A-CATH;  Surgeon: Jackolyn Confer, MD;  Location: [facility] ORS;  Service: General;  Laterality: N/A;       Home Medications    Prior to Admission medications   Medication Sig Start Date End Date Taking? Authorizing Provider  cephALEXin (KEFLEX) 500 MG capsule Take 1 capsule (500 mg total) by mouth 3 (three) times daily. 03/21/17   Dixie Dials, MD  Fluticasone-Salmeterol (ADVAIR DISKUS) 250-50 MCG/DOSE AEPB Inhale 1 puff into the lungs 2 (two) times daily. Patient taking differently: Inhale 1 puff into the lungs daily.  01/27/17   Truitt Merle, MD  HYDROcodone-acetaminophen (NORCO/VICODIN) 5-325 MG tablet Take 1 tablet by mouth every 6 (six) hours as needed for moderate pain or severe pain. 02/14/17   Truitt Merle, MD  metoprolol tartrate (LOPRESSOR) 25 MG tablet Take 1 tablet (25 mg total) by mouth 2 (two) times daily. 03/21/17   Dixie Dials, MD  mometasone (ELOCON) 0.1 % cream Apply 1 application topically daily. 03/17/17   Truitt Merle, MD  potassium chloride SA (K-DUR,KLOR-CON) 10 MEQ tablet Take 1 tablet (10 mEq total) by mouth daily. 03/21/17   Dixie Dials, MD  prochlorperazine (COMPAZINE) 10 MG tablet Take 1 tablet (10 mg total) by mouth every 6 (six) hours as needed (NAUSEA). Patient not taking: Reported on 03/20/2017 02/14/17   Truitt Merle, MD  sildenafil (VIAGRA) 100  MG tablet Take 100 mg by mouth as needed for erectile dysfunction.  11/05/16   [provider]    Family History Family History  Problem Relation Age of Onset  . Breast cancer Mother 43       +rad and lymph node  . Diabetes Maternal Grandmother        leading to blindness  . Obesity Maternal Aunt   . Stroke Maternal Uncle 74    Social History Social History  Substance Use Topics  . Smoking status: Never Smoker  . Smokeless  tobacco: Never Used  . Alcohol use No     Allergies   Oxaliplatin   Review of Systems Review of Systems The patient denies anorexia, fever, weight loss,, vision loss, decreased hearing, hoarseness, chest pain, syncope, dyspnea on exertion, peripheral edema, balance deficits, hemoptysis, abdominal pain, melena, hematochezia, severe indigestion/heartburn, hematuria, incontinence, genital sores, muscle weakness, suspicious skin lesions, transient blindness, difficulty walking, depression, unusual weight change, abnormal bleeding, enlarged lymph nodes, angioedema, and breast masses.   Physical Exam Updated Vital Signs BP 134/86 (BP Location: Left Arm)   Pulse 80   Temp 98.3 F (36.8 C) (Oral)   Resp 16   SpO2 98%   Physical Exam  Constitutional: He appears well-developed and well-nourished. No distress.  HENT:  Head: Normocephalic and atraumatic.  Eyes: Pupils are equal, round, and reactive to light.  Neck: Normal range of motion. Neck supple.  Cardiovascular: Normal rate and regular rhythm.   Pulmonary/Chest: Effort normal.  Abdominal: Soft.  Neurological: He is alert.  Skin: Skin is warm and dry.  Nursing note and vitals reviewed.    ED Treatments / Results  Labs (all labs ordered are listed, but only abnormal results are displayed) Labs Reviewed  COMPREHENSIVE METABOLIC PANEL - Abnormal; Notable for the following:       Result Value   CO2 20 (*)    Glucose, Bld 113 (*)    Calcium 8.7 (*)    All other components within normal limits  CULTURE, BLOOD (ROUTINE X 2)  CULTURE, BLOOD (ROUTINE X 2)  CBC WITH DIFFERENTIAL/PLATELET    EKG  EKG Interpretation None       Radiology No results found.  Procedures Procedures (including critical care time)  Medications Ordered in ED Medications  sulfamethoxazole-trimethoprim (BACTRIM DS,SEPTRA DS) 800-160 MG per tablet 1 tablet (not administered)  heparin lock flush 100 unit/mL (not administered)     Initial  Impression / Assessment and Plan / ED Course  I have reviewed the triage vital signs and the nursing notes.  Pertinent labs & imaging results that were available during my care of the patient were reviewed by me and considered in my medical decision making (see chart for details).     I spoke with Dr. Burr Medico, his oncologist. Bacteria could possibly be a contaminant but it is uncertain. Recommends repeat BC's one peripherally and one from port then start on Bactrim. Because the Keflex is upsetting his stomach I will discontinue this.  I spoke with Dr. Eulis Foster, the patient has normal CBC and BMP, normal vital signs and is overall feeling well. He was treated with IV Rocephin and Keflex.   Unable to pull blood of port, called Dr. Burr Medico to give her an update. She will see him either Friday or early next week for close follow-up and will follow-up on the blood cultures.  Final Clinical Impressions(s) / ED Diagnoses   Final diagnoses:  Positive blood culture   Blood pressure 134/86,  pulse 80, temperature 98.3 F (36.8 C), temperature source Oral, resp. rate 16, SpO2 98 %.  Lc Joynt has been evaluated today in the emergency department. The appropriate screening and testing was been performed and I believe the patient to be medically stable for discharge.   Return signs and symptoms have been discussed with the patient and/or caregivers and they have voiced their understanding. The patient has agreed to follow-up with their primary care provider or the referred specialist.   New Prescriptions New Prescriptions   No medications on file     Delos Haring, PA-C 03/26/17 1250    Daleen Bo, MD 03/26/17 [phone]

## 2017-03-26 NOTE — Telephone Encounter (Signed)
Scheduled appt per 9/17 sch message patient is aware of appt date and time.  

## 2017-03-26 NOTE — Progress Notes (Signed)
Following this patient along with the Cancer Center's Chaplain, Lorrin Jackson, MDiv, [facility].  Patient is waiting to be seen but says he feels fine and is wondering what's going on.  Patient is here by request of physician.  Chaplain will continue to support patient and his family through this event.      03/26/17 1035  Clinical Encounter Type  Visited With Patient and family together  Visit Type Follow-up;Psychological support;Spiritual support;Social support

## 2017-03-28 LAB — CULTURE, BLOOD (ROUTINE X 2): Special Requests: ADEQUATE

## 2017-03-31 ENCOUNTER — Telehealth: Payer: Self-pay | Admitting: Hematology

## 2017-03-31 LAB — CULTURE, BLOOD (ROUTINE X 2)
Culture: NO GROWTH
Culture: NO GROWTH
SPECIAL REQUESTS: ADEQUATE
SPECIAL REQUESTS: ADEQUATE

## 2017-03-31 NOTE — Telephone Encounter (Signed)
I called the patient and I left a message on his cell phone, regarding his blood cultures from last week. His initial positive blood culture came back positive for PROPIONIBACTERIUM ACNES, possibly skin contamination. His repeated blood cultures at the emergency room last week have been negative. He is scheduled to see me this Thursday. I encouraged him to call me back if he has any concerns.  Truitt Merle  03/31/2017

## 2017-04-01 NOTE — Progress Notes (Signed)
[place]  Telephone:([phone]) [phone] Fax:(336) [phone]  Clinic Follow Up Note    04/03/2017  CHIEF COMPLAINTS:  Follow Up colon cancer  Oncology History   T2, Colon cancer metastasized to lung [facility])   Staging form: Colon and Rectum, AJCC 7th Edition     Clinical stage from 03/30/2015: Stage Unknown ([place], N1, M1) - Unsigned       Colon cancer metastasized to lung [facility]) s/p laparoscopic assisted sigmoid colectomy 11/02/15   03/30/2015 Miscellaneous    Foundation one genomic testing showed TP53 mutation, MSI stable, low tumor mutation burden. Negative for K-ras, NRAS and BRAF      03/30/2015 Initial Biopsy    Sigmoid mass biopsy showed invasive adenocarcinoma. Cecal colon polyps showed tubular adenoma.      03/30/2015 Initial Diagnosis    Colon cancer      03/30/2015 Procedure    colonoscopy by Dr. Michail Sermon showed a fungating, infiltrative and ulcerated nonobstructing large mass in the sigmoid colon and at 20 cm proximal to the anus. The mass was partially circumferential no bleeding. A 10 mm polyps in the cecum was removed.      04/03/2015 Imaging    CT chest, abdomen and pelvis with contrast showed nodular masslike area of clinical worsening at rectosigmoid junction, tiny pericolonic lymph nodes, bilateral pulmonary nodules measuring about 1 cm.      04/12/2015 PET scan    Hypermetabolic colonic mass near rectosigmoid junction, tiny subcentimeter paracolonic lymph nodes. Bilateral pulmonary metastasis.      04/19/2015 Procedure    CT-guided lung nodule biopsy attempted, unsuccessful.      04/27/2015 - 09/14/2015 Chemotherapy    Oxaliplatin 130 mg/m on day 1, Capecitabine 2367m q12hr, 2 weeks on and one week off (only received 7 days for first cycle), oxaliplatin held on cycle 5 and dose reduced to 1025mm2, capecitabine reduced to 200096m12h D1-14, stopped per pt's request.       06/29/2015 - 10/19/2015 Chemotherapy     Panitumumab every 2 weeks, some  cycles were postponed due to pt's request, stoppe per pt's request       09/18/2015 Imaging    Pulmonary nodules are less hypermetabolic, stable size. Stable hypermetabolic portal hepatis and abdominal peritoneal ligament lymph nodes. No other new lesions.       11/02/2015 Surgery    sigmoid colon segmental resection       11/02/2015 Pathology Results    Sigmoid colon segmental resection showed adenocarcinoma, grade 3,  T2, 3 out of 13 lymph nodes were positive, surgical margins were negative. LVI(-), perineural invasion negative      11/13/2015 Imaging    CT chest, abdomen and pelvis with contrast showed postsurgical changes, mild progression of pulmonary metastasis, measuring up to 15 mm in the right lower lobe.      11/29/2015 - 12/05/2015 Radiation Therapy    SBRT to 4 lung lesions in 3 sessions       04/25/2016 - 08/05/2016 Chemotherapy    FOLFIRI every 2 weeks, and Avastin added from cycle 3, 5-FU held since cycle 2 due to poor tolerance. Chemo was stopped after 3 months due to overall poor tolerance and pt's request to stop.       06/27/2016 Imaging    CT CAP 06/27/16 IMPRESSION: Status post partial left hemicolectomy. Progressive wall thickening involving the colon near the suture line, worrisome for residual tumor. Adjacent 7 mm short axis pericolonic lymph node, possibly reflecting a nodal metastasis. Radiation changes in the posterior  right upper lobe and superior segment right lower lobe. Progressive pulmonary metastases bilaterally, measuring up to 13 mm, as above.      09/17/2016 Imaging    CT A/P/C w contrast  IMPRESSION: 1. Multifocal pulmonary nodules are not significantly changed in size compared with the previous exam. 2. Stable appearance of radiation changes within the right midlung. 3. Stable to scratch set improved appearance of wall thickening involving the colon at the level of the suture line.       11/11/2016 Imaging    CT Chest WO Contrast  11/11/16: IMPRESSION: 1. Minimal enlargement of some pulmonary metastases. 2. Coronary artery calcification. 3. Hepatic steatosis.       02/10/2017 Imaging    CT CAP W Contrast 02/10/17 IMPRESSION: 1. Progressive metastatic disease to the thorax as demonstrated by increased size of numerous previously noted pulmonary nodules, what appears to be lymphangitic spread of disease developing in the right upper lobe and superior segment of the right lower lobe, and new 1.5 cm short axis prevascular lymph node. 2. No definite findings of metastatic disease in the abdomen or pelvis. 3. Multiple tiny nonobstructive calculi in the collecting systems of the kidneys bilaterally measuring 2-3 mm in size. No ureteral stones or findings of urinary tract obstruction are noted at this time. 4. Hepatic steatosis. 5. Aortic atherosclerosis, in addition to left anterior descending coronary artery disease. Please note that although the presence of coronary artery calcium documents the presence of coronary artery disease, the severity of this disease and any potential stenosis cannot be assessed on this non-gated CT examination. Assessment for potential risk factor modification, dietary therapy or pharmacologic therapy may be warranted, if clinically indicated. 6. There are calcifications of the aortic valve. Echocardiographic correlation for evaluation of potential valvular dysfunction may be warranted if clinically indicated.       02/27/2017 -  Chemotherapy    Third-line chemotherapy Xeloda for 2 weeks on and 1 week off with Oxaliplatin every 3 weeks and Panitumumab every 2 weeks on a different day. He developed anaphylactic reaction to oxaliplatin, coded, oxaliplatin was subsequently stopped. He did not to tolerate Xeloda, plan to change to 5-FU bolus weekly         HISTORY OF PRESENTING ILLNESS:  Bill Wright 56 y.o. male is here because of recently newly diagnosed colon cancer.  He has had  intermittent bloody stool for 2 years, it has been mild, mixed with stool, patient does not have any abdominal pain, constipation, change of his bowel habits, nausea, weight loss or other symptoms. He did not seek medical attention for this. He went to emergency room on 03/15/2015 for right flank pain, due to his kidney stone. CT scan incidentally found a 11 mm right lower lobe nodule and mucosal edema in the sigmoid colon. He saw his primary care physician, and was referred to GI Dr. Michail Sermon here at he underwent colonoscopy on 03/30/2015, which showed a fungating infiltrative and ulcerated nonobstructing large mass in the sigmoid colon, biopsy showed adenocarcinoma. CT chest abdomen and pelvis showed multiple lung nodules measuring about 1 cm. He was referred to surgeon Dr. Zella Richer, who referred patient to [place] for further workup of his lung nodule and discuss chemotherapy.  He feels very well overall, denies any symptoms. 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 He never had screening colonoscopy prior the reason one, no significant past medical history, does not see doctors regularly.   CURRENT TREATMENT:  Supportive  care, restart panitumumab every 2 weeks next month    [place] returns for follow-up. He went into cardiac arrest in the infusion room during his previous treatment and had to be resuscitated. He had some chest pain from the compressions which has resolved now. He feels better compared to last week but now watches activities he does. This morning he woke up feeling nauseous. He say Dr.Kadakia earlier this week but patient does not want to take any medications. He thinks his full recovery time period will be 3-6 months. He plans to take at least 3 months off of work. He has some rash due to treatment. He has some tingling pain up left leg that does not go below knee, on scale a 5/10, worsening within the last 2 weeks, that is more constant.  Denies neuropathy in fingers but both his arms fall asleep while sleeping and he his having night sweats.    MEDICAL HISTORY:  Past Medical History:  Diagnosis Date  . Anxiety    situational due to cancer diagnosis  . Cancer [facility]) 2017   colon-chemo 09/22/15 now surgery  . Hypercholesteremia   . Kidney calculi     SURGICAL HISTORY: Past Surgical History:  Procedure Laterality Date  . COLONOSCOPY  03/30/15  . LAPAROSCOPIC PARTIAL COLECTOMY N/A 11/02/2015   Procedure: LAPAROSCOPIC ASSISTED SIGMOID COLECTOMY;  Surgeon: Jackolyn Confer, MD;  Location: [facility];  Service: General;  Laterality: N/A;  . LEFT HEART CATH AND CORONARY ANGIOGRAPHY N/A 03/21/2017   Procedure: LEFT HEART CATH AND CORONARY ANGIOGRAPHY;  Surgeon: Dixie Dials, MD;  Location: [place] [facility];  Service: Cardiovascular;  Laterality: N/A;  . PORTACATH PLACEMENT N/A 05/08/2016   Procedure: INSERTION PORT-A-CATH;  Surgeon: Jackolyn Confer, MD;  Location: [facility];  Service: General;  Laterality: N/A;    SOCIAL HISTORY: Social History   Social History  . Marital Status: Married    Spouse Name: N/A  . Number of Children: 3, age of 43, 17 and 64   . Years of Education: N/A   Occupational History  . Banker for [employer] [place]    Social History Main Topics  . Smoking status: Never Smoker   . Smokeless tobacco: Not on file  . Alcohol Use: No  . Drug Use: No  . Sexual Activity: Not on file   Other Topics Concern  . Not on file   Social History Narrative    FAMILY HISTORY: Family History  Problem Relation Age of Onset  . Breast cancer Mother 10       +rad and lymph node  . Diabetes Maternal Grandmother        leading to blindness  . Obesity Maternal Aunt   . Stroke Maternal Uncle 62    ALLERGIES:  is allergic to oxaliplatin.  MEDICATIONS:  Current Outpatient Prescriptions  Medication Sig Dispense Refill  . Fluticasone-Salmeterol (ADVAIR DISKUS) 250-50 MCG/DOSE AEPB Inhale 1 puff into the lungs 2  (two) times daily. (Patient taking differently: Inhale 1 puff into the lungs daily. ) 180 each 1  . metoprolol tartrate (LOPRESSOR) 25 MG tablet Take 1 tablet (25 mg total) by mouth 2 (two) times daily. 60 tablet 3  . mometasone (ELOCON) 0.1 % cream Apply 1 application topically daily. 45 g 2  . HYDROcodone-acetaminophen (NORCO/VICODIN) 5-325 MG tablet Take 1 tablet by mouth every 6 (six) hours as needed for moderate pain or severe pain. (Patient not taking: Reported on 04/03/2017) 20 tablet 0  . potassium chloride SA (  K-DUR,KLOR-CON) 10 MEQ tablet Take 1 tablet (10 mEq total) by mouth daily. (Patient not taking: Reported on 04/03/2017) 30 tablet 3  . prochlorperazine (COMPAZINE) 10 MG tablet Take 1 tablet (10 mg total) by mouth every 6 (six) hours as needed (NAUSEA). (Patient not taking: Reported on 03/20/2017) 30 tablet 2  . sildenafil (VIAGRA) 100 MG tablet Take 100 mg by mouth as needed for erectile dysfunction.   5   No current facility-administered medications for this visit.     REVIEW OF SYSTEMS:  Constitutional: Denies fevers, chills or abnormal night sweats, no weight loss. Eyes: Denies blurriness of vision, double vision or watery eyes Ears, nose, mouth, throat, and face: Denies mucositis or sore throat Respiratory: Denies dyspnea or wheezes  Cardiovascular: Denies palpitation, chest discomfort or lower extremity swelling Gastrointestinal:  Denies heartburn or change in bowel habits (+) nausea Skin: (+) rash Lymphatics: Denies new lymphadenopathy or easy bruising Neurological:Denies numbness, tingling or new weaknesses MSK: normal Behavioral/Psych: Mood is stable, no new changes  All other systems were reviewed with the patient and are negative.  PHYSICAL EXAMINATION:  ECOG PERFORMANCE STATUS: 1 BP 126/64 (BP Location: Right Arm, Patient Position: Sitting)   Pulse 82   Temp 98.1 F (36.7 C) (Oral)   Resp 18   Ht 5' 6"  (1.676 m)   Wt 221 lb 6.4 oz (100.4 kg)   SpO2 100%    BMI 35.73 kg/m   GENERAL:alert, no distress and comfortable SKIN: skin color, texture, turgor are normal. No rash noted on the face, except mild skin erythema on his cheeks. Diffuse acne-like skin rash on his neck and upper chest, no skin erythema or signs of infection. EYES: normal, conjunctiva are pink and non-injected, sclera clear OROPHARYNX:no exudate, no erythema and lips, buccal mucosa, and tongue normal  NECK: supple, thyroid normal size, non-tender, without nodularity LYMPH:  no palpable lymphadenopathy in the cervical, axillary or inguinal LUNGS: clear to auscultation and percussion with normal breathing effort HEART: regular rate & rhythm and no murmurs and no lower extremity edema ABDOMEN:abdomen soft, non-tender and normal bowel sounds. The midline incision has healed well, no discharge or skin erythema  Musculoskeletal:no cyanosis of digits and no clubbing  PSYCH: alert & oriented x 3 with fluent speech NEURO: no focal motor/sensory deficits  LABORATORY DATA:  I have reviewed the data as listed CBC Latest Ref Rng & Units 04/03/2017 03/26/2017 03/21/2017  WBC 4.0 - 10.3 10e3/uL 5.0 7.8 12.9(H)  Hemoglobin 13.0 - 17.1 g/dL 13.4 13.9 13.5  Hematocrit 38.4 - 49.9 % 39.9 44.6 40.8  Platelets 140 - 400 10e3/uL 214 198 234   CMP Latest Ref Rng & Units 04/03/2017 03/26/2017 03/21/2017  Glucose 70 - 140 mg/dl 113 113(H) 176(H)  BUN 7.0 - 26.0 mg/dL 9.2 12 14   Creatinine 0.7 - 1.3 mg/dL 0.8 0.67 0.76  Sodium 136 - 145 mEq/L 140 135 136  Potassium 3.5 - 5.1 mEq/L 3.9 4.0 4.4  Chloride 101 - 111 mmol/L - 103 104  CO2 22 - 29 mEq/L 26 20(L) 22  Calcium 8.4 - 10.4 mg/dL 9.4 8.7(L) 8.7(L)  Total Protein 6.4 - 8.3 g/dL 7.0 6.7 -  Total Bilirubin 0.20 - 1.20 mg/dL 0.28 0.6 -  Alkaline Phos 40 - 150 U/L 88 74 -  AST 5 - 34 U/L 21 21 -  ALT 0 - 55 U/L 31 35 -   CEA:  04/20/2016: <0.5 06/08/2015: <0.5 09/07/2015: <0.5 11/15/2015: 0.9 02/16/2016: <1.0  04/25/2016: 1.6 05/23/16:  <1.00 06/20/2016:  1.05 07/22/2016: <1.00 09/17/16: <1.00 11/14/16: 1.60 02/10/17: 5.94 03/20/2017: 4.92  PATHOLOGY REPORT  Diagnosis 11/02/2015 1. Colon, segmental resection for tumor, sigmoid ADENOCARCINOMA OF THE SIGMOID COLON (2.0 CM), GRADE 3 THE TUMOR INVADES MUSCULARIS PROPRIA MARGINS OF RESECTION ARE NEGATIVE METASTATIC ADENOCARCINOMA IN THREE OF THIRTEEN LYMPH NODES (3/13) 2. Colon, resection margin (donut), distal sigmoid BENIGN COLONIC TISSUE Microscopic Comment 1. COLON AND RECTUM (INCLUDING TRANS-ANAL RESECTION): Specimen: Sigmoid Procedure: Segmental resection Tumor site: sigmoid Specimen integrity: Intact Macroscopic intactness of mesorectum: Not applicable: x Complete: NA Near complete: NA Incomplete: NA Cannot be determined (specify): NA Macroscopic tumor perforation: Muscularis Invasive tumor: Maximum size: 2.0 cm Histologic type(s): Adenocarcinoma Histologic grade and differentiation: G3 G1: well differentiated/low grade G2: moderately differentiated/low grade G3: poorly differentiated/high grade G4: undifferentiated/high grade Type of polyp in which invasive carcinoma arose: Tubular adenoma Microscopic extension of invasive tumor: Muscularis propria Lymph-Vascular invasion: Negative Peri-neural invasion: Negative Tumor deposit(s) (discontinuous extramural extension): Negative Resection margins: Proximal margin: Negative Distal margin: Negative Circumferential (radial) (posterior ascending, posterior descending; lateral and posterior mid-rectum; and entire lower 1/3 rectum):Negative Mesenteric margin (sigmoid and transverse): Negative Distance closest margin (if all above margins negative): 3.5 cm Trans-anal resection margins only: Deep margin: NA Mucosal Margin: NA Distance closest mucosal margin (if negative): NA Treatment effect (neo-adjuvant therapy): Partial Additional polyp(s): None Non-neoplastic findings: unremarkable Lymph nodes: number  examined 13; number positive: 3 Pathologic Staging: T2, N1b, M1a     RADIOGRAPHIC STUDIES: I have personally reviewed the radiological images as listed and agreed with the findings in the report.  PET 04/12/2015 IMPRESSION: Hypermetabolic colonic mass near rectosigmoid junction, consistent with known primary colon carcinoma. Tiny sub-cm pericolonic lymph nodes in sigmoid mesocolon are too small to characterize by PET, but are suspicious for early lymph node metastases. Bilateral pulmonary metastases  PET 09/18/2015 IMPRESSION: 1. Hypermetabolic pulmonary metastases have enlarged slightly from 07/18/2015. 2. Hypermetabolic porta hepatis/abdominal peritoneal ligament lymph nodes, stable from 04/12/2015. 3. Increase in hypermetabolism associated with mesenteric haziness and nodularity. While a reactive phenomenon/panniculitis can create this in appearance, metastatic disease/lymphoproliferative disorder cannot be excluded. 4. Hepatic steatosis. 5. Bilateral nephrolithiasis.  CT chest, abdomen and pelvis with contrast 06/27/2016 IMPRESSION: Status post partial left hemicolectomy. Progressive wall thickening involving the colon near the suture line, worrisome for residual tumor. Adjacent 7 mm short axis pericolonic lymph node, possibly reflecting a nodal metastasis. Radiation changes in the posterior right upper lobe and superior segment right lower lobe. Progressive pulmonary metastases bilaterally, measuring up to 13 mm, as above.   CT A/P/C w contrast 09/17/16:  IMPRESSION: 1. Multifocal pulmonary nodules are not significantly changed in size compared with the previous exam. 2. Stable appearance of radiation changes within the right midlung. 3. Stable to scratch set improved appearance of wall thickening involving the colon at the level of the suture line.   CT Chest WO Contrast 11/11/16: IMPRESSION: 1. Minimal enlargement of some pulmonary metastases. 2.  Coronary artery calcification. 3. Hepatic steatosis.   CT CAP W Contrast 02/10/17 IMPRESSION: 1. Progressive metastatic disease to the thorax as demonstrated by increased size of numerous previously noted pulmonary nodules, what appears to be lymphangitic spread of disease developing in the right upper lobe and superior segment of the right lower lobe, and new 1.5 cm short axis prevascular lymph node. 2. No definite findings of metastatic disease in the abdomen or pelvis. 3. Multiple tiny nonobstructive calculi in the collecting systems of the kidneys bilaterally measuring 2-3 mm in size. No ureteral stones or findings  of urinary tract obstruction are noted at this time. 4. Hepatic steatosis. 5. Aortic atherosclerosis, in addition to left anterior descending coronary artery disease. Please note that although the presence of coronary artery calcium documents the presence of coronary artery disease, the severity of this disease and any potential stenosis cannot be assessed on this non-gated CT examination. Assessment for potential risk factor modification, dietary therapy or pharmacologic therapy may be warranted, if clinically indicated. 6. There are calcifications of the aortic valve. Echocardiographic correlation for evaluation of potential valvular dysfunction may be warranted if clinically indicated.   ASSESSMENT & PLAN:  57 y.o. male, without significant past medical history except kidney stone, presented with intermittent bloody stool for 2 years, and colonoscopy showed a large sigmoid colon mass, CT scan showed multiple (at least 4) nodules in bilateral lungs, measuring about 1 cm.  1. Sigmoid colon adenocarcinoma, pT2N1bM1, stage IV with lung mets, KRA/NRAS wild type, MSI-stable -I previously reviewed his colonoscopy, initial CT scan findings and the biopsy results in great details with patient and his wife. -he received first line chemo FOLFOX and panitumumab, followed by  hemicolectomy, SBRT to 4 lung nodules, and second line chemo irinotecan and avastin, chemotherapy was finally stopped due to his poor tolerance. -I previously reviewed his surgical pathology findings, which showed a residual T2 primary tumor, 3 lymph nodes positive, grade 3 disease, certainly high risk disease.  -We previously discussed that his disease is likely incurable, and the goal of therapy is maximum disease control and prolong his life.  -- His tumor does not contain KRAS/NRAS or BRAF mutation, so he would benefit from EGFR antibody, Panitumumab was added on from cycle 4 but tolerated poorly dur to skin rash  -He is off chemotherapy now, clinically doing well, with mild dry cough and dyspnea, otherwise asymptomatic. -I previously reviewed his 02/10/17 restaging CT chest, which showed significant increase the size of multiple lung nodules, and possible lymphogenic spread of disease in the right upper lobe and superior segment of right lower lobe.   -I strongly recommend him to resume chemotherapy due to his disease progression, he is also moderately symptomatically. Patient has been previously resistant to chemotherapy previously due to poor tolerance. After lengthy discussion, patient agreed to restart chemotherapy. -We previously discussed his current chemo therapy options. He previously received CAPOX and FOLFIRI, no disease progression on chemo. Based on side effects Pt prefers to resume CAPOX every 3 weeks and Panitumumab every 2 weeks, which was his first line chemo. Due to his tolerance issue, will slightly decrease his dose.  -he tolerated the Xeloda poorly, and refused to continue. We'll changed to 5-FU, however he declined 5-FU pump infusion, we'll changed to weekly 5-FU bolus with leucovorin on day 8 and 15 of 21 day cycle, with oxaliplatin on day 1. He refuses to have his intravenous chemotherapy on same day.  -Unfortunately he developed anaphylactic reaction to oxaliplatin on 03/20/2017  and was resuscitated, chemotherapy will be held for now. -  completely remove oxaliplatin due to cardiac arrest  -His port has no blood return. Will schedule dye study for port. He is against doing chemo venous. He only wants through his port -plan to restart panitumumab in a month -if he recovers well, may consider add weekly 5-fu bolus treatment in 2-3 months. Pt refuses 5-fu pump   2. Cardiac arrest on 03/20/2017 -Secondary to oxaliplatin -He underwent cardiac catheterization, which was negative for coronary artery disease -He will continue follow-up with his cardiologist Dr. Dorthy Cooler  -  He is on metoprolol   3. Dry cough and dyspnea  -His initial cough is likely related to his SBRT,he received a course of prednisone  -I'm concerned he is dry cough to be related to his cancer progression in the lung  -previously Much improved since he started chemotherapy -mild and stable lately   4. Obesity  -I again encouraged him to eat healthy and exercise   5. Anxiety  -He has been having palpitation, sweating episodes, after his cardiac arrest event. Possible anxiety attacks  - I suggested he try low dose Xanex to help with his anxiety, he refuses at this time  6. Left leg neuropathy -Possibly related to chemotherapy, also not very typical  -I suggest him to take over-the-counter vitamin B complex  7. Goal of care discussion  -We again discussed the incurable nature of his cancer, although his cancer seems to be more indolent -The patient understands the goal of care is palliative. -He is full code now   8. Pain medication -He has been taking Oxycodone once a day as needed due to his high intensity job and some chest discomfort which could be related to stress.  -He really does not have much cancer related pain. We discussed narcotic addiction, I recommend him to gradually wean off oxycodone. He tried but not successful    PLAN -hold chemo for now - port dye study in 2 weeks, if needed,  will replace his port  - f/u, lab and flush and panitumumab in 4 weeks   I spent 25 minutes counseling the patient face to face. The total time spent in the appointment was 30 minutes and more than 50% was on counseling.  This document serves as a record of services personally performed by Truitt Merle, MD. It was created on her behalf by Brandt Loosen, a trained medical scribe. The creation of this record is based on the scribe's personal observations and the provider's statements to them. This document has been checked and approved by the attending provider.   I have reviewed the above documentation for accuracy and completeness and I agree with the above.   Truitt Merle, MD 04/03/2017

## 2017-04-03 ENCOUNTER — Ambulatory Visit (HOSPITAL_BASED_OUTPATIENT_CLINIC_OR_DEPARTMENT_OTHER): Payer: BLUE CROSS/BLUE SHIELD | Admitting: Hematology

## 2017-04-03 ENCOUNTER — Telehealth: Payer: Self-pay | Admitting: Hematology

## 2017-04-03 ENCOUNTER — Encounter: Payer: Self-pay | Admitting: Hematology

## 2017-04-03 ENCOUNTER — Other Ambulatory Visit (HOSPITAL_BASED_OUTPATIENT_CLINIC_OR_DEPARTMENT_OTHER): Payer: BLUE CROSS/BLUE SHIELD

## 2017-04-03 VITALS — BP 126/64 | HR 82 | Temp 98.1°F | Resp 18 | Ht 66.0 in | Wt 221.4 lb

## 2017-04-03 DIAGNOSIS — E669 Obesity, unspecified: Secondary | ICD-10-CM | POA: Diagnosis not present

## 2017-04-03 DIAGNOSIS — C7800 Secondary malignant neoplasm of unspecified lung: Principal | ICD-10-CM

## 2017-04-03 DIAGNOSIS — C7802 Secondary malignant neoplasm of left lung: Secondary | ICD-10-CM | POA: Diagnosis not present

## 2017-04-03 DIAGNOSIS — C7801 Secondary malignant neoplasm of right lung: Secondary | ICD-10-CM

## 2017-04-03 DIAGNOSIS — C187 Malignant neoplasm of sigmoid colon: Secondary | ICD-10-CM

## 2017-04-03 DIAGNOSIS — R0600 Dyspnea, unspecified: Secondary | ICD-10-CM

## 2017-04-03 DIAGNOSIS — R05 Cough: Secondary | ICD-10-CM

## 2017-04-03 DIAGNOSIS — G629 Polyneuropathy, unspecified: Secondary | ICD-10-CM

## 2017-04-03 DIAGNOSIS — Z8674 Personal history of sudden cardiac arrest: Secondary | ICD-10-CM

## 2017-04-03 DIAGNOSIS — C189 Malignant neoplasm of colon, unspecified: Secondary | ICD-10-CM

## 2017-04-03 DIAGNOSIS — F419 Anxiety disorder, unspecified: Secondary | ICD-10-CM

## 2017-04-03 DIAGNOSIS — I469 Cardiac arrest, cause unspecified: Secondary | ICD-10-CM

## 2017-04-03 LAB — CBC WITH DIFFERENTIAL/PLATELET
BASO%: 0.8 % (ref 0.0–2.0)
Basophils Absolute: 0 10*3/uL (ref 0.0–0.1)
EOS ABS: 0.1 10*3/uL (ref 0.0–0.5)
EOS%: 1.7 % (ref 0.0–7.0)
HEMATOCRIT: 39.9 % (ref 38.4–49.9)
HGB: 13.4 g/dL (ref 13.0–17.1)
LYMPH%: 19.6 % (ref 14.0–49.0)
MCH: 29.1 pg (ref 27.2–33.4)
MCHC: 33.6 g/dL (ref 32.0–36.0)
MCV: 86.7 fL (ref 79.3–98.0)
MONO#: 0.7 10*3/uL (ref 0.1–0.9)
MONO%: 13.6 % (ref 0.0–14.0)
NEUT#: 3.2 10*3/uL (ref 1.5–6.5)
NEUT%: 64.3 % (ref 39.0–75.0)
Platelets: 214 10*3/uL (ref 140–400)
RBC: 4.61 10*6/uL (ref 4.20–5.82)
RDW: 14.8 % — ABNORMAL HIGH (ref 11.0–14.6)
WBC: 5 10*3/uL (ref 4.0–10.3)
lymph#: 1 10*3/uL (ref 0.9–3.3)

## 2017-04-03 LAB — COMPREHENSIVE METABOLIC PANEL
ALK PHOS: 88 U/L (ref 40–150)
ALT: 31 U/L (ref 0–55)
AST: 21 U/L (ref 5–34)
Albumin: 3.3 g/dL — ABNORMAL LOW (ref 3.5–5.0)
Anion Gap: 8 mEq/L (ref 3–11)
BUN: 9.2 mg/dL (ref 7.0–26.0)
CALCIUM: 9.4 mg/dL (ref 8.4–10.4)
CHLORIDE: 106 meq/L (ref 98–109)
CO2: 26 mEq/L (ref 22–29)
Creatinine: 0.8 mg/dL (ref 0.7–1.3)
Glucose: 113 mg/dl (ref 70–140)
POTASSIUM: 3.9 meq/L (ref 3.5–5.1)
SODIUM: 140 meq/L (ref 136–145)
Total Bilirubin: 0.28 mg/dL (ref 0.20–1.20)
Total Protein: 7 g/dL (ref 6.4–8.3)

## 2017-04-03 LAB — MAGNESIUM: MAGNESIUM: 2.2 mg/dL (ref 1.5–2.5)

## 2017-04-03 NOTE — Telephone Encounter (Signed)
Gave avs and calendar for October  °

## 2017-04-04 ENCOUNTER — Other Ambulatory Visit: Payer: Self-pay | Admitting: Hematology

## 2017-04-04 DIAGNOSIS — I469 Cardiac arrest, cause unspecified: Secondary | ICD-10-CM

## 2017-04-04 DIAGNOSIS — C7800 Secondary malignant neoplasm of unspecified lung: Principal | ICD-10-CM

## 2017-04-04 DIAGNOSIS — C189 Malignant neoplasm of colon, unspecified: Secondary | ICD-10-CM

## 2017-04-09 ENCOUNTER — Other Ambulatory Visit: Payer: Self-pay | Admitting: Hematology

## 2017-04-09 ENCOUNTER — Ambulatory Visit (HOSPITAL_COMMUNITY)
Admission: RE | Admit: 2017-04-09 | Discharge: 2017-04-09 | Disposition: A | Discharge status: Home / Self Care | Payer: BLUE CROSS/BLUE SHIELD | Source: Ambulatory Visit | Attending: Hematology | Admitting: Hematology

## 2017-04-09 ENCOUNTER — Encounter (HOSPITAL_COMMUNITY): Payer: Self-pay | Admitting: Interventional Radiology

## 2017-04-09 DIAGNOSIS — I469 Cardiac arrest, cause unspecified: Secondary | ICD-10-CM

## 2017-04-09 DIAGNOSIS — Y831 Surgical operation with implant of artificial internal device as the cause of abnormal reaction of the patient, or of later complication, without mention of misadventure at the time of the procedure: Secondary | ICD-10-CM | POA: Insufficient documentation

## 2017-04-09 DIAGNOSIS — C189 Malignant neoplasm of colon, unspecified: Secondary | ICD-10-CM | POA: Diagnosis not present

## 2017-04-09 DIAGNOSIS — C7800 Secondary malignant neoplasm of unspecified lung: Secondary | ICD-10-CM

## 2017-04-09 DIAGNOSIS — T82898A Other specified complication of vascular prosthetic devices, implants and grafts, initial encounter: Secondary | ICD-10-CM | POA: Insufficient documentation

## 2017-04-09 HISTORY — PX: IR CV LINE INJECTION: IMG2294

## 2017-04-09 MED ORDER — HEPARIN SOD (PORK) LOCK FLUSH 100 UNIT/ML IV SOLN
INTRAVENOUS | Status: DC | PRN
  Administered 2017-04-09: 500 [IU]

## 2017-04-09 MED ORDER — IOPAMIDOL (ISOVUE-300) INJECTION 61%
10.0000 mL | Freq: Once | INTRAVENOUS | Status: AC | PRN
  Administered 2017-04-09: 10 mL via INTRAVENOUS

## 2017-04-09 MED ORDER — HEPARIN SOD (PORK) LOCK FLUSH 100 UNIT/ML IV SOLN
INTRAVENOUS | Status: AC
  Filled 2017-04-09: qty 5

## 2017-04-09 MED ORDER — IOPAMIDOL (ISOVUE-300) INJECTION 61%
INTRAVENOUS | Status: AC
  Administered 2017-04-09: 10 mL via INTRAVENOUS
  Filled 2017-04-09: qty 50

## 2017-04-14 ENCOUNTER — Other Ambulatory Visit: Payer: Self-pay | Admitting: General Surgery

## 2017-04-15 ENCOUNTER — Ambulatory Visit (HOSPITAL_COMMUNITY)
Admission: RE | Admit: 2017-04-15 | Discharge: 2017-04-15 | Disposition: A | Discharge status: Home / Self Care | Payer: BLUE CROSS/BLUE SHIELD | Source: Ambulatory Visit | Attending: Hematology | Admitting: Hematology

## 2017-04-15 ENCOUNTER — Other Ambulatory Visit: Payer: Self-pay | Admitting: Hematology

## 2017-04-15 ENCOUNTER — Encounter (HOSPITAL_COMMUNITY): Payer: Self-pay

## 2017-04-15 DIAGNOSIS — F419 Anxiety disorder, unspecified: Secondary | ICD-10-CM | POA: Diagnosis not present

## 2017-04-15 DIAGNOSIS — Z9049 Acquired absence of other specified parts of digestive tract: Secondary | ICD-10-CM | POA: Diagnosis not present

## 2017-04-15 DIAGNOSIS — C189 Malignant neoplasm of colon, unspecified: Secondary | ICD-10-CM | POA: Diagnosis not present

## 2017-04-15 DIAGNOSIS — I469 Cardiac arrest, cause unspecified: Secondary | ICD-10-CM

## 2017-04-15 DIAGNOSIS — C7800 Secondary malignant neoplasm of unspecified lung: Principal | ICD-10-CM

## 2017-04-15 DIAGNOSIS — E7800 Pure hypercholesterolemia, unspecified: Secondary | ICD-10-CM | POA: Insufficient documentation

## 2017-04-15 DIAGNOSIS — Y713 Surgical instruments, materials and cardiovascular devices (including sutures) associated with adverse incidents: Secondary | ICD-10-CM | POA: Diagnosis not present

## 2017-04-15 DIAGNOSIS — T82514A Breakdown (mechanical) of infusion catheter, initial encounter: Secondary | ICD-10-CM | POA: Diagnosis not present

## 2017-04-15 HISTORY — PX: IR REMOVAL TUN ACCESS W/ PORT W/O FL MOD SED: IMG2290

## 2017-04-15 HISTORY — PX: IR FLUORO GUIDE PORT INSERTION RIGHT: IMG5741

## 2017-04-15 HISTORY — PX: IR US GUIDE VASC ACCESS RIGHT: IMG2390

## 2017-04-15 LAB — CBC
HEMATOCRIT: 39.4 % (ref 39.0–52.0)
Hemoglobin: 13.3 g/dL (ref 13.0–17.0)
MCH: 29.1 pg (ref 26.0–34.0)
MCHC: 33.8 g/dL (ref 30.0–36.0)
MCV: 86.2 fL (ref 78.0–100.0)
Platelets: 283 10*3/uL (ref 150–400)
RBC: 4.57 MIL/uL (ref 4.22–5.81)
RDW: 13.7 % (ref 11.5–15.5)
WBC: 5.1 10*3/uL (ref 4.0–10.5)

## 2017-04-15 LAB — PROTIME-INR
INR: 0.95
Prothrombin Time: 12.6 seconds (ref 11.4–15.2)

## 2017-04-15 MED ORDER — MIDAZOLAM HCL 2 MG/2ML IJ SOLN
INTRAMUSCULAR | Status: AC
  Filled 2017-04-15: qty 2

## 2017-04-15 MED ORDER — HEPARIN SOD (PORK) LOCK FLUSH 100 UNIT/ML IV SOLN
INTRAVENOUS | Status: AC
  Filled 2017-04-15: qty 5

## 2017-04-15 MED ORDER — CEFAZOLIN SODIUM-DEXTROSE 2-4 GM/100ML-% IV SOLN
INTRAVENOUS | Status: AC
  Administered 2017-04-15: 2 g via INTRAVENOUS
  Filled 2017-04-15: qty 100

## 2017-04-15 MED ORDER — LIDOCAINE-EPINEPHRINE (PF) 2 %-1:200000 IJ SOLN
INTRAMUSCULAR | Status: AC | PRN
  Administered 2017-04-15: 10 mL

## 2017-04-15 MED ORDER — LIDOCAINE-EPINEPHRINE (PF) 2 %-1:200000 IJ SOLN
INTRAMUSCULAR | Status: AC
  Filled 2017-04-15: qty 20

## 2017-04-15 MED ORDER — SODIUM CHLORIDE 0.9 % IV SOLN
INTRAVENOUS | Status: DC
  Administered 2017-04-15: 10:00:00 via INTRAVENOUS

## 2017-04-15 MED ORDER — MIDAZOLAM HCL 2 MG/2ML IJ SOLN
INTRAMUSCULAR | Status: AC | PRN
  Administered 2017-04-15 (×2): 1 mg via INTRAVENOUS

## 2017-04-15 MED ORDER — FENTANYL CITRATE (PF) 100 MCG/2ML IJ SOLN
INTRAMUSCULAR | Status: AC
  Filled 2017-04-15: qty 2

## 2017-04-15 MED ORDER — FENTANYL CITRATE (PF) 100 MCG/2ML IJ SOLN
INTRAMUSCULAR | Status: AC | PRN
  Administered 2017-04-15 (×2): 50 ug via INTRAVENOUS

## 2017-04-15 MED ORDER — CEFAZOLIN SODIUM-DEXTROSE 2-4 GM/100ML-% IV SOLN
2.0000 g | INTRAVENOUS | Status: AC
  Administered 2017-04-15: 2 g via INTRAVENOUS

## 2017-04-15 NOTE — H&P (Signed)
Chief Complaint: Patient was seen in consultation today for port exchange at the request of Bill Wright  Referring Physician(s): Bill Wright  Supervising Physician: Bill Wright  Patient Status: [name]  History of Present Illness: Bill Wright is a 56 y.o. male with a (R)sided port placed last year by Dr. Zella Wright. It has developed some fibrin sheath and become dysfunctional. After injection last week, he and Bill Wright have decided on port exchange. PMHx, meds, labs, imaging reviewed. Has been NPO this am.  Past Medical History:  Diagnosis Date  . Anxiety    situational due to cancer diagnosis  . Cancer [facility] 2017   colon-chemo 09/22/15 now surgery  . Hypercholesteremia   . Kidney calculi     Past Surgical History:  Procedure Laterality Date  . COLONOSCOPY  03/30/15  . IR CV LINE INJECTION  04/09/2017  . LAPAROSCOPIC PARTIAL COLECTOMY N/A 11/02/2015   Procedure: LAPAROSCOPIC ASSISTED SIGMOID COLECTOMY;  Surgeon: Bill Confer, MD;  Location: [facility];  Service: General;  Laterality: N/A;  . LEFT HEART CATH AND CORONARY ANGIOGRAPHY N/A 03/21/2017   Procedure: LEFT HEART CATH AND CORONARY ANGIOGRAPHY;  Surgeon: Bill Dials, MD;  Location: [place] CV [facility];  Service: Cardiovascular;  Laterality: N/A;  . PORTACATH PLACEMENT N/A 05/08/2016   Procedure: INSERTION PORT-A-CATH;  Surgeon: Bill Confer, MD;  Location: [facility];  Service: General;  Laterality: N/A;    Allergies: Oxaliplatin  Medications: Prior to Admission medications   Medication Sig Start Date End Date Taking? Authorizing Provider  Fluticasone-Salmeterol (ADVAIR DISKUS) 250-50 MCG/DOSE AEPB Inhale 1 puff into the lungs 2 (two) times daily. Patient taking differently: Inhale 1 puff into the lungs daily.  01/27/17  Yes Bill Merle, MD  HYDROcodone-acetaminophen (NORCO/VICODIN) 5-325 MG tablet Take 1 tablet by mouth every 6 (six) hours as needed for moderate pain or severe pain. 02/14/17  Yes Bill Merle, MD    metoprolol tartrate (LOPRESSOR) 25 MG tablet Take 1 tablet (25 mg total) by mouth 2 (two) times daily. 03/21/17  Yes Bill Dials, MD  potassium chloride SA (K-DUR,KLOR-CON) 10 MEQ tablet Take 1 tablet (10 mEq total) by mouth daily. 03/21/17  Yes Bill Dials, MD  mometasone (ELOCON) 0.1 % cream Apply 1 application topically daily. 03/17/17   Bill Merle, MD  prochlorperazine (COMPAZINE) 10 MG tablet Take 1 tablet (10 mg total) by mouth every 6 (six) hours as needed (NAUSEA). Patient not taking: Reported on 03/20/2017 02/14/17   Bill Merle, MD  sildenafil (VIAGRA) 100 MG tablet Take 100 mg by mouth as needed for erectile dysfunction.  11/05/16   [provider]     Family History  Problem Relation Age of Onset  . Breast cancer Mother 36       +rad and lymph node  . Diabetes Maternal Grandmother        leading to blindness  . Obesity Maternal Aunt   . Stroke Maternal Uncle 65      Review of Systems: A 12 point ROS discussed and pertinent positives are indicated in the HPI above.  All other systems are negative.  Review of Systems  Vital Signs: BP 121/84 (BP Location: Left Arm)   Pulse 78   Temp 99 F (37.2 C) (Oral)   Resp 18   Ht 5\' 6"  (1.676 m)   Wt 221 lb (100.2 kg)   SpO2 99%   BMI 35.67 kg/m   Physical Exam  Constitutional: He is oriented to person, place, and time. He appears well-developed. No distress.  HENT:  Head: Normocephalic.  Mouth/Throat: Oropharynx is clear and moist.  Neck: Normal range of motion. No JVD present. No tracheal deviation present.  Cardiovascular: Normal rate, regular rhythm and normal heart sounds.   Pulmonary/Chest: Effort normal and breath sounds normal. No respiratory distress.  Neurological: He is alert and oriented to person, place, and time.  Skin: Skin is warm and dry.  (R)chest port palpable, incision well healed  Psychiatric: He has a normal mood and affect.    Imaging: Ir Cv Line Injection  Result Date:  04/09/2017 INDICATION: History of metastatic colon cancer, now with difficulty aspirating from surgically placed Port a Catheter. The Port a catheter was placed in November of 2017. EXAM: FLUOROSCOPIC GUIDED PORT A CATHETER CHECK MEDICATIONS: None. CONTRAST:  10 cc Isovue-300 FLUOROSCOPY TIME:  6 seconds (1.3 mGy) COMPLICATIONS: None immediate. TECHNIQUE: The procedure, risks, benefits, and alternatives were explained to the patient and informed written consent was obtained. A timeout was performed prior to the initiation of the procedure. The patient's chest port a catheter was accessed by the IV team. The patient was placed supine on the fluoroscopy table. A preprocedural spot fluoroscopic image was obtained of the chest in existing port a catheter. Note was made of difficulty aspirating blood from the port a catheter. Contrast was injected via the Port a catheter and images were reviewed. The Port a catheter was flushed with a heparin dwell and de accessed. A dressing was placed. The patient tolerated the procedure well without immediate postprocedural complication. FINDINGS: Unchanged positioning of right anterior chest wall jugular approach port a catheter with tip overlying the expected location of the mid SVC. Contrast injection demonstrates the presence of a large fibrin sheath involving the Port a Catheter extending to the level of the confluence of the internal jugular vein and the brachiocephalic vein. There was difficulty aspirating blood from the port a catheter. There is no evidence of catheter kink or fracture. No contrast extravasation. IMPRESSION: Difficulty aspirating of the Port a catheter is secondary to the presence of a large fibrin sheath. Above findings were discussed with the patient and referring oncologist, Bill Wright, and the following options were provided: 1. Maintain the Port a catheter as is realizing it may be difficulty for blood draws, but could be used for medication administration.  2. Attempt to perform a fibrin sheath stripping from the port a catheter tip via a right common femoral vein approach. 3. Complete revision of the port a catheter with placement of the port a catheter tip within the superior aspect of the right atrium. After discussion of all potential treatment options, the patient and Bill Wright wish to pursue definitive Port a catheter replacement. As such, the procedure will be scheduled for next Tuesday, October 9th. Electronically Signed   By: Bill Wright M.D.   On: 04/09/2017 14:22   Dg Chest Portable 1 View  Result Date: 03/20/2017 CLINICAL DATA:  Patient with coughing and shortness of breath. Recent cardiac arrest. EXAM: PORTABLE CHEST 1 VIEW COMPARISON:  CT chest 02/10/2017; chest radiograph 05/08/2016 FINDINGS: Right anterior chest wall Port-A-Cath is present with tip projecting over the superior vena cava. Monitoring leads overlie the patient. Stable enlarged cardiac and mediastinal contours. Peripheral bandlike opacity within the right mid lung. Additional known pulmonary nodules bilaterally are not well demonstrated on current exam. No pleural effusion or pneumothorax. IMPRESSION: Stable cardiomegaly. Unchanged peripheral bandlike opacity within the right mid lung. Electronically Signed   By: Lovey Newcomer M.D.   On:  03/20/2017 17:32    Labs:  CBC:  Recent Labs  03/21/17 0440 03/26/17 1058 04/03/17 0957 04/15/17 1004  WBC 12.9* 7.8 5.0 5.1  HGB 13.5 13.9 13.4 13.3  HCT 40.8 44.6 39.9 39.4  PLT 234 198 214 283    COAGS:  Recent Labs  03/20/17 1844 04/15/17 1004  INR 1.04 0.95    BMP:  Recent Labs  03/20/17 1745 03/20/17 1757 03/21/17 0440 03/26/17 1058 04/03/17 0957  NA 137 140 136 135 140  K 3.3* 3.4* 4.4 4.0 3.9  CL 104 102 104 103  --   CO2 22  --  22 20* 26  GLUCOSE 116* 117* 176* 113* 113  BUN 12 11 14 12  9.2  CALCIUM 8.5*  --  8.7* 8.7* 9.4  CREATININE 0.96 0.80 0.76 0.67 0.8  GFRNONAA >60  --  >60 >60  --   GFRAA >60   --  >60 >60  --     LIVER FUNCTION TESTS:  Recent Labs  03/20/17 1306 03/20/17 1745 03/26/17 1058 04/03/17 0957  BILITOT 0.37 0.7 0.6 0.28  AST 23 31 21 21   ALT 33 34 35 31  ALKPHOS 95 81 74 88  PROT 7.4 6.7 6.7 7.0  ALBUMIN 3.7 3.5 3.6 3.3*    TUMOR MARKERS: No results for input(s): AFPTM, CEA, CA199, CHROMGRNA in the last 8760 hours.  Assessment and Plan: Metastatic colon cancer Dysfunctional port secondary to fibrin sheath Plan for image guided port removal and replacement Labs ok Risks and benefits discussed with the patient including, but not limited to bleeding, infection, pneumothorax, or fibrin sheath development and need for additional procedures. All of the patient's questions were answered, patient is agreeable to proceed. Consent signed and in chart.    Thank you for this interesting consult.  I greatly enjoyed meeting Roldan Laforest and look forward to participating in their care.  A copy of this report was sent to the requesting provider on this date.  Electronically Signed: Ascencion Dike, PA-C 04/15/2017, 11:14 AM   I spent a total of 20 minutes in face to face in clinical consultation, greater than 50% of which was counseling/coordinating care for port exchange

## 2017-04-15 NOTE — Discharge Instructions (Signed)
Implanted Port Insertion, Care After This sheet gives you information about how to care for yourself after your procedure. Your health care provider may also give you more specific instructions. If you have problems or questions, contact your health care provider. What can I expect after the procedure? After your procedure, it is common to have:  Discomfort at the port insertion site.  Bruising on the skin over the port. This should improve over 3-4 days.  Follow these instructions at home: [facility] care  After your port is placed, you will get a manufacturer's information card. The card has information about your port. Keep this card with you at all times.  Take care of the port as told by your health care provider. Ask your health care provider if you or a family member can get training for taking care of the port at home. A home health care nurse may also take care of the port.  Make sure to remember what type of port you have. Incision care  Follow instructions from your health care provider about how to take care of your port insertion site. Make sure you: ? Wash your hands with soap and water before you change your bandage (dressing). If soap and water are not available, use hand sanitizer. ? Change your dressing as told by your health care provider. You may remove dressing tomorrow. Please do not use EMLA cream for 2 weeks after port placement as this will remove surgical glue. You may shower tomorrow. ? Leave stitches (sutures), skin glue, or adhesive strips in place. These skin closures may need to stay in place for 2 weeks or longer. If adhesive strip edges start to loosen and curl up, you may trim the loose edges. Do not remove adhesive strips completely unless your health care provider tells you to do that.   Check your port insertion site every day for signs of infection. Check for: ? More redness, swelling, or pain. ? More fluid or blood. ? Warmth. ? Pus or a bad  smell. General instructions  Do not take baths, swim, or use a hot tub until your health care provider approves.  Do not lift anything that is heavier than 10 lb (4.5 kg) for a week, or as told by your health care provider.  Ask your health care provider when it is okay to: ? Return to work or school. ? Resume usual physical activities or sports.  Do not drive for 24 hours if you were given a medicine to help you relax (sedative).  Take over-the-counter and prescription medicines only as told by your health care provider.  Wear a medical alert bracelet in case of an emergency. This will tell any health care providers that you have a port.  Keep all follow-up visits as told by your health care provider. This is important. Contact a health care provider if:  You have a fever or chills.  You have more redness, swelling, or pain around your port insertion site.  You have more fluid or blood coming from your port insertion site.  Your port insertion site feels warm to the touch.  You have pus or a bad smell coming from the port insertion site. Get help right away if:  You have chest pain or shortness of breath.  You have bleeding from your port that you cannot control. Summary  Take care of the port as told by your health care provider.  Change your dressing as told by your health care provider.  Keep all follow-up visits as told by your health care provider. This information is not intended to replace advice given to you by your health care provider. Make sure you discuss any questions you have with your health care provider. Document Released: 04/14/2013 Document Revised: 05/15/2016 Document Reviewed: 05/15/2016 Elsevier Interactive Patient Education  2017 [place] An implanted port is a type of central line that is placed under the skin. Central lines are used to provide IV access when treatment or nutrition needs to be given through a  persons veins. Implanted ports are used for long-term IV access. An implanted port may be placed because:  You need IV medicine that would be irritating to the small veins in your hands or arms.  You need long-term IV medicines, such as antibiotics.  You need IV nutrition for a long period.  You need frequent blood draws for lab tests.  You need dialysis.  Implanted ports are usually placed in the chest area, but they can also be placed in the upper arm, the abdomen, or the leg. An implanted port has two main parts:  Reservoir. The reservoir is round and will appear as a small, raised area under your skin. The reservoir is the part where a needle is inserted to give medicines or draw blood.  Catheter. The catheter is a thin, flexible tube that extends from the reservoir. The catheter is placed into a large vein. Medicine that is inserted into the reservoir goes into the catheter and then into the vein.  How will I care for my incision site? Do not get the incision site wet. Bathe or shower as directed by your health care provider. How is my port accessed? Special steps must be taken to access the port:  Before the port is accessed, a numbing cream can be placed on the skin. This helps numb the skin over the port site.  Your health care provider uses a sterile technique to access the port. ? Your health care provider must put on a mask and sterile gloves. ? The skin over your port is cleaned carefully with an antiseptic and allowed to dry. ? The port is gently pinched between sterile gloves, and a needle is inserted into the port.  Only "non-coring" port needles should be used to access the port. Once the port is accessed, a blood return should be checked. This helps ensure that the port is in the vein and is not clogged.  If your port needs to remain accessed for a constant infusion, a clear (transparent) bandage will be placed over the needle site. The bandage and needle will need  to be changed every week, or as directed by your health care provider.  Keep the bandage covering the needle clean and dry. Do not get it wet. Follow your health care providers instructions on how to take a shower or bath while the port is accessed.  If your port does not need to stay accessed, no bandage is needed over the port.  What is flushing? Flushing helps keep the port from getting clogged. Follow your health care providers instructions on how and when to flush the port. Ports are usually flushed with saline solution or a medicine called heparin. The need for flushing will depend on how the port is used.  If the port is used for intermittent medicines or blood draws, the port will need to be flushed: ? After medicines have been given. ? After blood has been drawn. ?  As part of routine maintenance.  If a constant infusion is running, the port may not need to be flushed.  How long will my port stay implanted? The port can stay in for as long as your health care provider thinks it is needed. When it is time for the port to come out, surgery will be done to remove it. The procedure is similar to the one performed when the port was put in. When should I seek immediate medical care? When you have an implanted port, you should seek immediate medical care if:  You notice a bad smell coming from the incision site.  You have swelling, redness, or drainage at the incision site.  You have more swelling or pain at the port site or the surrounding area.  You have a fever that is not controlled with medicine.  This information is not intended to replace advice given to you by your health care provider. Make sure you discuss any questions you have with your health care provider. Document Released: 06/24/2005 Document Revised: 11/30/2015 Document Reviewed: 03/01/2013 Elsevier Interactive Patient Education  2017 [place]. Moderate Conscious Sedation, Adult, Care After These instructions  provide you with information about caring for yourself after your procedure. Your health care provider may also give you more specific instructions. Your treatment has been planned according to current medical practices, but problems sometimes occur. Call your health care provider if you have any problems or questions after your procedure. What can I expect after the procedure? After your procedure, it is common:  To feel sleepy for several hours.  To feel clumsy and have poor balance for several hours.  To have poor judgment for several hours.  To vomit if you eat too soon.  Follow these instructions at home: For at least 24 hours after the procedure:   Do not: ? Participate in activities where you could fall or become injured. ? Drive. ? Use heavy machinery. ? Drink alcohol. ? Take sleeping pills or medicines that cause drowsiness. ? Make important decisions or sign legal documents. ? Take care of children on your own.  Rest. Eating and drinking  Follow the diet recommended by your health care provider.  If you vomit: ? Drink water, juice, or soup when you can drink without vomiting. ? Make sure you have little or no nausea before eating solid foods. General instructions  Have a responsible adult stay with you until you are awake and alert.  Take over-the-counter and prescription medicines only as told by your health care provider.  If you smoke, do not smoke without supervision.  Keep all follow-up visits as told by your health care provider. This is important. Contact a health care provider if:  You keep feeling nauseous or you keep vomiting.  You feel light-headed.  You develop a rash.  You have a fever. Get help right away if:  You have trouble breathing. This information is not intended to replace advice given to you by your health care provider. Make sure you discuss any questions you have with your health care provider. Document Released: 04/14/2013  Document Revised: 11/27/2015 Document Reviewed: 10/14/2015 Elsevier Interactive Patient Education  [employer].

## 2017-04-15 NOTE — Procedures (Signed)
Pre Procedure Dx: Colon cancer, poorly functioning port-a-catheter. Post Procedural Dx: Same  Successful replacement/revision of right IJ approach port-a-cath with tip at the superior caval atrial junction. The catheter is ready for immediate use.  Estimated Blood Loss: Minimal  Complications: None immediate.  Ronny Bacon, MD Pager #: [phone]

## 2017-05-01 ENCOUNTER — Other Ambulatory Visit (HOSPITAL_BASED_OUTPATIENT_CLINIC_OR_DEPARTMENT_OTHER): Payer: BLUE CROSS/BLUE SHIELD

## 2017-05-01 ENCOUNTER — Ambulatory Visit: Payer: BLUE CROSS/BLUE SHIELD

## 2017-05-01 ENCOUNTER — Ambulatory Visit (HOSPITAL_BASED_OUTPATIENT_CLINIC_OR_DEPARTMENT_OTHER): Payer: BLUE CROSS/BLUE SHIELD | Admitting: Hematology

## 2017-05-01 ENCOUNTER — Ambulatory Visit (HOSPITAL_BASED_OUTPATIENT_CLINIC_OR_DEPARTMENT_OTHER): Payer: BLUE CROSS/BLUE SHIELD

## 2017-05-01 VITALS — BP 141/87 | HR 83 | Temp 98.3°F | Resp 18

## 2017-05-01 DIAGNOSIS — C187 Malignant neoplasm of sigmoid colon: Secondary | ICD-10-CM

## 2017-05-01 DIAGNOSIS — R05 Cough: Secondary | ICD-10-CM | POA: Diagnosis not present

## 2017-05-01 DIAGNOSIS — Z5112 Encounter for antineoplastic immunotherapy: Secondary | ICD-10-CM

## 2017-05-01 DIAGNOSIS — G629 Polyneuropathy, unspecified: Secondary | ICD-10-CM

## 2017-05-01 DIAGNOSIS — C7802 Secondary malignant neoplasm of left lung: Secondary | ICD-10-CM

## 2017-05-01 DIAGNOSIS — C189 Malignant neoplasm of colon, unspecified: Secondary | ICD-10-CM

## 2017-05-01 DIAGNOSIS — F419 Anxiety disorder, unspecified: Secondary | ICD-10-CM | POA: Diagnosis not present

## 2017-05-01 DIAGNOSIS — C7801 Secondary malignant neoplasm of right lung: Secondary | ICD-10-CM

## 2017-05-01 DIAGNOSIS — E669 Obesity, unspecified: Secondary | ICD-10-CM | POA: Diagnosis not present

## 2017-05-01 DIAGNOSIS — C7800 Secondary malignant neoplasm of unspecified lung: Principal | ICD-10-CM

## 2017-05-01 LAB — COMPREHENSIVE METABOLIC PANEL
ALBUMIN: 3.9 g/dL (ref 3.5–5.0)
ALK PHOS: 87 U/L (ref 40–150)
ALT: 31 U/L (ref 0–55)
ANION GAP: 10 meq/L (ref 3–11)
AST: 27 U/L (ref 5–34)
BILIRUBIN TOTAL: 0.52 mg/dL (ref 0.20–1.20)
BUN: 11.1 mg/dL (ref 7.0–26.0)
CALCIUM: 9.7 mg/dL (ref 8.4–10.4)
CO2: 27 mEq/L (ref 22–29)
CREATININE: 0.9 mg/dL (ref 0.7–1.3)
Chloride: 106 mEq/L (ref 98–109)
EGFR: >60 mL/min/{1.73_m2} (ref >60)
Glucose: 114 mg/dl (ref 70–140)
Potassium: 4.1 mEq/L (ref 3.5–5.1)
Sodium: 142 mEq/L (ref 136–145)
TOTAL PROTEIN: 7.7 g/dL (ref 6.4–8.3)

## 2017-05-01 LAB — CBC WITH DIFFERENTIAL/PLATELET
BASO%: 0.3 % (ref 0.0–2.0)
Basophils Absolute: 0 10*3/uL (ref 0.0–0.1)
EOS%: 1.3 % (ref 0.0–7.0)
Eosinophils Absolute: 0.1 10*3/uL (ref 0.0–0.5)
HEMATOCRIT: 39.6 % (ref 38.4–49.9)
HEMOGLOBIN: 13.1 g/dL (ref 13.0–17.1)
LYMPH#: 1.2 10*3/uL (ref 0.9–3.3)
LYMPH%: 15.7 % (ref 14.0–49.0)
MCH: 29.4 pg (ref 27.2–33.4)
MCHC: 33.1 g/dL (ref 32.0–36.0)
MCV: 89 fL (ref 79.3–98.0)
MONO#: 0.8 10*3/uL (ref 0.1–0.9)
MONO%: 10.8 % (ref 0.0–14.0)
NEUT#: 5.4 10*3/uL (ref 1.5–6.5)
NEUT%: 71.9 % (ref 39.0–75.0)
PLATELETS: 234 10*3/uL (ref 140–400)
RBC: 4.45 10*6/uL (ref 4.20–5.82)
RDW: 14.1 % (ref 11.0–14.6)
WBC: 7.5 10*3/uL (ref 4.0–10.3)

## 2017-05-01 LAB — MAGNESIUM: MAGNESIUM: 2.3 mg/dL (ref 1.5–2.5)

## 2017-05-01 LAB — CEA (IN HOUSE-CHCC): CEA (CHCC-IN HOUSE): 13.29 ng/mL — AB (ref 0.00–5.00)

## 2017-05-01 MED ORDER — PANITUMUMAB CHEMO INJECTION 100 MG/5ML
6.0000 mg/kg | Freq: Once | INTRAVENOUS | Status: AC
  Administered 2017-05-01: 600 mg via INTRAVENOUS
  Filled 2017-05-01: qty 10

## 2017-05-01 MED ORDER — HEPARIN SOD (PORK) LOCK FLUSH 100 UNIT/ML IV SOLN
500.0000 [IU] | Freq: Once | INTRAVENOUS | Status: AC | PRN
  Administered 2017-05-01: 500 [IU]
  Filled 2017-05-01: qty 5

## 2017-05-01 MED ORDER — SODIUM CHLORIDE 0.9% FLUSH
10.0000 mL | INTRAVENOUS | Status: DC | PRN
  Administered 2017-05-01: 10 mL
  Filled 2017-05-01: qty 10

## 2017-05-01 MED ORDER — SODIUM CHLORIDE 0.9 % IV SOLN
Freq: Once | INTRAVENOUS | Status: AC
  Administered 2017-05-01: 10:00:00 via INTRAVENOUS

## 2017-05-01 NOTE — Patient Instructions (Signed)
Implanted Port Home Guide An implanted port is a type of central line that is placed under the skin. Central lines are used to provide IV access when treatment or nutrition needs to be given through a person's veins. Implanted ports are used for long-term IV access. An implanted port may be placed because:  You need IV medicine that would be irritating to the small veins in your hands or arms.  You need long-term IV medicines, such as antibiotics.  You need IV nutrition for a long period.  You need frequent blood draws for lab tests.  You need dialysis.  Implanted ports are usually placed in the chest area, but they can also be placed in the upper arm, the abdomen, or the leg. An implanted port has two main parts:  Reservoir. The reservoir is round and will appear as a small, raised area under your skin. The reservoir is the part where a needle is inserted to give medicines or draw blood.  Catheter. The catheter is a thin, flexible tube that extends from the reservoir. The catheter is placed into a large vein. Medicine that is inserted into the reservoir goes into the catheter and then into the vein.  How will I care for my incision site? Do not get the incision site wet. Bathe or shower as directed by your health care provider. How is my port accessed? Special steps must be taken to access the port:  Before the port is accessed, a numbing cream can be placed on the skin. This helps numb the skin over the port site.  Your health care provider uses a sterile technique to access the port. ? Your health care provider must put on a mask and sterile gloves. ? The skin over your port is cleaned carefully with an antiseptic and allowed to dry. ? The port is gently pinched between sterile gloves, and a needle is inserted into the port.  Only "non-coring" port needles should be used to access the port. Once the port is accessed, a blood return should be checked. This helps ensure that the port  is in the vein and is not clogged.  If your port needs to remain accessed for a constant infusion, a clear (transparent) bandage will be placed over the needle site. The bandage and needle will need to be changed every week, or as directed by your health care provider.  Keep the bandage covering the needle clean and dry. Do not get it wet. Follow your health care provider's instructions on how to take a shower or bath while the port is accessed.  If your port does not need to stay accessed, no bandage is needed over the port.  What is flushing? Flushing helps keep the port from getting clogged. Follow your health care provider's instructions on how and when to flush the port. Ports are usually flushed with saline solution or a medicine called heparin. The need for flushing will depend on how the port is used.  If the port is used for intermittent medicines or blood draws, the port will need to be flushed: ? After medicines have been given. ? After blood has been drawn. ? As part of routine maintenance.  If a constant infusion is running, the port may not need to be flushed.  How long will my port stay implanted? The port can stay in for as long as your health care provider thinks it is needed. When it is time for the port to come out, surgery will be   done to remove it. The procedure is similar to the one performed when the port was put in. When should I seek immediate medical care? When you have an implanted port, you should seek immediate medical care if:  You notice a bad smell coming from the incision site.  You have swelling, redness, or drainage at the incision site.  You have more swelling or pain at the port site or the surrounding area.  You have a fever that is not controlled with medicine.  This information is not intended to replace advice given to you by your health care provider. Make sure you discuss any questions you have with your health care provider. Document  Released: 06/24/2005 Document Revised: 11/30/2015 Document Reviewed: 03/01/2013 Elsevier Interactive Patient Education  2017 [employer]  

## 2017-05-01 NOTE — Patient Instructions (Signed)
[facility] Discharge Instructions for Patients Receiving Chemotherapy  Today you received the following chemotherapy agents Vectibix  To help prevent nausea and vomiting after your treatment, we encourage you to take your nausea medication as directed.   If you develop nausea and vomiting that is not controlled by your nausea medication, call the clinic.   BELOW ARE SYMPTOMS THAT SHOULD BE REPORTED IMMEDIATELY:  *FEVER GREATER THAN 100.5 F  *CHILLS WITH OR WITHOUT FEVER  NAUSEA AND VOMITING THAT IS NOT CONTROLLED WITH YOUR NAUSEA MEDICATION  *UNUSUAL SHORTNESS OF BREATH  *UNUSUAL BRUISING OR BLEEDING  TENDERNESS IN MOUTH AND THROAT WITH OR WITHOUT PRESENCE OF ULCERS  *URINARY PROBLEMS  *BOWEL PROBLEMS  UNUSUAL RASH Items with * indicate a potential emergency and should be followed up as soon as possible.  Feel free to call the clinic you have any questions or concerns. The clinic phone number is [phone].  Please show the CHEMO ALERT CARD at check-in to the Emergency Department and triage nurse.   

## 2017-05-01 NOTE — Progress Notes (Signed)
[place]  Telephone:([phone]) [phone] Fax:([phone]) [phone]  Clinic Follow Up Note    05/01/2017  CHIEF COMPLAINTS:  Follow Up colon cancer  Oncology History   T2, Colon cancer metastasized to lung [facility])   Staging form: Colon and Rectum, AJCC 7th Edition     Clinical stage from 03/30/2015: Stage Unknown ([place], N1, M1) - Unsigned       Colon cancer metastasized to lung [facility]) s/p laparoscopic assisted sigmoid colectomy 11/02/15   03/30/2015 Miscellaneous    Foundation one genomic testing showed TP53 mutation, MSI stable, low tumor mutation burden. Negative for K-ras, NRAS and BRAF      03/30/2015 Initial Biopsy    Sigmoid mass biopsy showed invasive adenocarcinoma. Cecal colon polyps showed tubular adenoma.      03/30/2015 Initial Diagnosis    Colon cancer      03/30/2015 Procedure    colonoscopy by Dr. Michail Sermon showed a fungating, infiltrative and ulcerated nonobstructing large mass in the sigmoid colon and at 20 cm proximal to the anus. The mass was partially circumferential no bleeding. A 10 mm polyps in the cecum was removed.      04/03/2015 Imaging    CT chest, abdomen and pelvis with contrast showed nodular masslike area of clinical worsening at rectosigmoid junction, tiny pericolonic lymph nodes, bilateral pulmonary nodules measuring about 1 cm.      04/12/2015 PET scan    Hypermetabolic colonic mass near rectosigmoid junction, tiny subcentimeter paracolonic lymph nodes. Bilateral pulmonary metastasis.      04/19/2015 Procedure    CT-guided lung nodule biopsy attempted, unsuccessful.      04/27/2015 - 09/14/2015 Chemotherapy    Oxaliplatin 130 mg/m on day 1, Capecitabine 2366m q12hr, 2 weeks on and one week off (only received 7 days for first cycle), oxaliplatin held on cycle 5 and dose reduced to 1057mm2, capecitabine reduced to 200061m12h D1-14, stopped per pt's request.       06/29/2015 - 10/19/2015 Chemotherapy     Panitumumab every 2 weeks, some  cycles were postponed due to pt's request, stoppe per pt's request       09/18/2015 Imaging    Pulmonary nodules are less hypermetabolic, stable size. Stable hypermetabolic portal hepatis and abdominal peritoneal ligament lymph nodes. No other new lesions.       11/02/2015 Surgery    sigmoid colon segmental resection       11/02/2015 Pathology Results    Sigmoid colon segmental resection showed adenocarcinoma, grade 3,  T2, 3 out of 13 lymph nodes were positive, surgical margins were negative. LVI(-), perineural invasion negative      11/13/2015 Imaging    CT chest, abdomen and pelvis with contrast showed postsurgical changes, mild progression of pulmonary metastasis, measuring up to 15 mm in the right lower lobe.      11/29/2015 - 12/05/2015 Radiation Therapy    SBRT to 4 lung lesions in 3 sessions       04/25/2016 - 08/05/2016 Chemotherapy    FOLFIRI every 2 weeks, and Avastin added from cycle 3, 5-FU held since cycle 2 due to poor tolerance. Chemo was stopped after 3 months due to overall poor tolerance and pt's request to stop.       06/27/2016 Imaging    CT CAP 06/27/16 IMPRESSION: Status post partial left hemicolectomy. Progressive wall thickening involving the colon near the suture line, worrisome for residual tumor. Adjacent 7 mm short axis pericolonic lymph node, possibly reflecting a nodal metastasis. Radiation changes in the posterior  right upper lobe and superior segment right lower lobe. Progressive pulmonary metastases bilaterally, measuring up to 13 mm, as above.      09/17/2016 Imaging    CT A/P/C w contrast  IMPRESSION: 1. Multifocal pulmonary nodules are not significantly changed in size compared with the previous exam. 2. Stable appearance of radiation changes within the right midlung. 3. Stable to scratch set improved appearance of wall thickening involving the colon at the level of the suture line.       11/11/2016 Imaging    CT Chest WO Contrast  11/11/16: IMPRESSION: 1. Minimal enlargement of some pulmonary metastases. 2. Coronary artery calcification. 3. Hepatic steatosis.       02/10/2017 Imaging    CT CAP W Contrast 02/10/17 IMPRESSION: 1. Progressive metastatic disease to the thorax as demonstrated by increased size of numerous previously noted pulmonary nodules, what appears to be lymphangitic spread of disease developing in the right upper lobe and superior segment of the right lower lobe, and new 1.5 cm short axis prevascular lymph node. 2. No definite findings of metastatic disease in the abdomen or pelvis. 3. Multiple tiny nonobstructive calculi in the collecting systems of the kidneys bilaterally measuring 2-3 mm in size. No ureteral stones or findings of urinary tract obstruction are noted at this time. 4. Hepatic steatosis. 5. Aortic atherosclerosis, in addition to left anterior descending coronary artery disease. Please note that although the presence of coronary artery calcium documents the presence of coronary artery disease, the severity of this disease and any potential stenosis cannot be assessed on this non-gated CT examination. Assessment for potential risk factor modification, dietary therapy or pharmacologic therapy may be warranted, if clinically indicated. 6. There are calcifications of the aortic valve. Echocardiographic correlation for evaluation of potential valvular dysfunction may be warranted if clinically indicated.       02/27/2017 -  Chemotherapy    Third-line chemotherapy Xeloda for 2 weeks on and 1 week off with Oxaliplatin every 3 weeks and Panitumumab every 2 weeks on a different day. He developed anaphylactic reaction to oxaliplatin, coded, oxaliplatin was subsequently stopped. He did not to tolerate Xeloda, plan to change to 5-FU bolus weekly, Held chemo since 03/20/17.   Restarted panitumumab every 2 weeks on 05/01/17         HISTORY OF PRESENTING ILLNESS:  Bill Wright 56 y.o.  male is here because of recently newly diagnosed colon cancer.  He has had intermittent bloody stool for 2 years, it has been mild, mixed with stool, patient does not have any abdominal pain, constipation, change of his bowel habits, nausea, weight loss or other symptoms. He did not seek medical attention for this. He went to emergency room on 03/15/2015 for right flank pain, due to his kidney stone. CT scan incidentally found a 11 mm right lower lobe nodule and mucosal edema in the sigmoid colon. He saw his primary care physician, and was referred to GI Dr. Michail Sermon here at he underwent colonoscopy on 03/30/2015, which showed a fungating infiltrative and ulcerated nonobstructing large mass in the sigmoid colon, biopsy showed adenocarcinoma. CT chest abdomen and pelvis showed multiple lung nodules measuring about 1 cm. He was referred to surgeon Dr. Zella Richer, who referred patient to [place] for further workup of his lung nodule and discuss chemotherapy.  He feels very well overall, denies any symptoms. 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 He never had screening colonoscopy prior the reason one, no significant past  medical history, does not see doctors regularly.   CURRENT TREATMENT:  restart panitumumab on 05/01/17 for every 2 weeks   [place] returns for follow-up and to restart panitumumab. He presents in the infusion room and reports that since his port change he feels much better. No more tenderness or chest pain. Last 1-2 weeks he has felt almost back to normal. He will wait another month before going back to work.  His cough is still there with no phlegm. He is taking cough syrup since last week. He is able to  He saw his cardiologist last month and there was no evidence of his heart fluttering even though he will feel like it some times.    MEDICAL HISTORY:  Past Medical History:  Diagnosis Date  . Anxiety    situational due to cancer diagnosis   . Cancer [facility]) 2017   colon-chemo 09/22/15 now surgery  . Hypercholesteremia   . Kidney calculi     SURGICAL HISTORY: Past Surgical History:  Procedure Laterality Date  . COLONOSCOPY  03/30/15  . IR CV LINE INJECTION  04/09/2017  . IR FLUORO GUIDE PORT INSERTION RIGHT  04/15/2017  . IR REMOVAL TUN ACCESS W/ PORT W/O FL MOD SED  04/15/2017  . IR US GUIDE VASC ACCESS RIGHT  04/15/2017  . LAPAROSCOPIC PARTIAL COLECTOMY N/A 11/02/2015   Procedure: LAPAROSCOPIC ASSISTED SIGMOID COLECTOMY;  Surgeon: Jackolyn Confer, MD;  Location: [facility];  Service: General;  Laterality: N/A;  . LEFT HEART CATH AND CORONARY ANGIOGRAPHY N/A 03/21/2017   Procedure: LEFT HEART CATH AND CORONARY ANGIOGRAPHY;  Surgeon: Dixie Dials, MD;  Location: [facility];  Service: Cardiovascular;  Laterality: N/A;  . PORTACATH PLACEMENT N/A 05/08/2016   Procedure: INSERTION PORT-A-CATH;  Surgeon: Jackolyn Confer, MD;  Location: [facility];  Service: General;  Laterality: N/A;    SOCIAL HISTORY: Social History   Social History  . Marital Status: Married    Spouse Name: N/A  . Number of Children: 3, age of 76, 31 and 44   . Years of Education: N/A   Occupational History  . Banker for [employer] [place]    Social History Main Topics  . Smoking status: Never Smoker   . Smokeless tobacco: Not on file  . Alcohol Use: No  . Drug Use: No  . Sexual Activity: Not on file   Other Topics Concern  . Not on file   Social History Narrative    FAMILY HISTORY: Family History  Problem Relation Age of Onset  . Breast cancer Mother 25       +rad and lymph node  . Diabetes Maternal Grandmother        leading to blindness  . Obesity Maternal Aunt   . Stroke Maternal Uncle 6    ALLERGIES:  is allergic to oxaliplatin.  MEDICATIONS:  Current Outpatient Prescriptions  Medication Sig Dispense Refill  . Fluticasone-Salmeterol (ADVAIR DISKUS) 250-50 MCG/DOSE AEPB Inhale 1 puff into the lungs 2 (two) times daily. (Patient  taking differently: Inhale 1 puff into the lungs daily. ) 180 each 1  . HYDROcodone-acetaminophen (NORCO/VICODIN) 5-325 MG tablet Take 1 tablet by mouth every 6 (six) hours as needed for moderate pain or severe pain. 20 tablet 0  . metoprolol tartrate (LOPRESSOR) 25 MG tablet Take 1 tablet (25 mg total) by mouth 2 (two) times daily. 60 tablet 3  . mometasone (ELOCON) 0.1 % cream Apply 1 application topically daily. 45 g 2  . potassium chloride SA (  K-DUR,KLOR-CON) 10 MEQ tablet Take 1 tablet (10 mEq total) by mouth daily. 30 tablet 3  . prochlorperazine (COMPAZINE) 10 MG tablet Take 1 tablet (10 mg total) by mouth every 6 (six) hours as needed (NAUSEA). (Patient not taking: Reported on 03/20/2017) 30 tablet 2  . sildenafil (VIAGRA) 100 MG tablet Take 100 mg by mouth as needed for erectile dysfunction.   5   No current facility-administered medications for this visit.     REVIEW OF SYSTEMS:  Constitutional: Denies fevers, chills or abnormal night sweats, no weight loss. Eyes: Denies blurriness of vision, double vision or watery eyes Ears, nose, mouth, throat, and face: Denies mucositis or sore throat Respiratory: Denies dyspnea or wheezes (+) dry cough  Cardiovascular: Denies palpitation, chest discomfort or lower extremity swelling Gastrointestinal:  Denies heartburn or change in bowel habits Skin: negative Lymphatics: Denies new lymphadenopathy or easy bruising Neurological:Denies numbness, tingling or new weaknesses MSK: normal Behavioral/Psych: Mood is stable, no new changes  All other systems were reviewed with the patient and are negative.  PHYSICAL EXAMINATION:  ECOG PERFORMANCE STATUS: 1 BP 141/87, heart rate 83, respiratory rate 18, temperature 36.8, pulse ox 98% on room air GENERAL:alert, no distress and comfortable SKIN: skin color, texture, turgor are normal. No rash noted on the face, except mild skin erythema on his cheeks. Diffuse acne-like skin rash on his neck and upper  chest, no skin erythema or signs of infection. EYES: normal, conjunctiva are pink and non-injected, sclera clear OROPHARYNX:no exudate, no erythema and lips, buccal mucosa, and tongue normal  NECK: supple, thyroid normal size, non-tender, without nodularity LYMPH:  no palpable lymphadenopathy in the cervical, axillary or inguinal LUNGS: clear to auscultation and percussion with normal breathing effort HEART: regular rate & rhythm and no murmurs and no lower extremity edema ABDOMEN:abdomen soft, non-tender and normal bowel sounds. The midline incision has healed well, no discharge or skin erythema  Musculoskeletal:no cyanosis of digits and no clubbing  PSYCH: alert & oriented x 3 with fluent speech NEURO: no focal motor/sensory deficits  LABORATORY DATA:  I have reviewed the data as listed CBC Latest Ref Rng & Units 05/01/2017 04/15/2017 04/03/2017  WBC 4.0 - 10.3 10e3/uL 7.5 5.1 5.0  Hemoglobin 13.0 - 17.1 g/dL 13.1 13.3 13.4  Hematocrit 38.4 - 49.9 % 39.6 39.4 39.9  Platelets 140 - 400 10e3/uL 234 283 214   CMP Latest Ref Rng & Units 05/01/2017 04/03/2017 03/26/2017  Glucose 70 - 140 mg/dl 114 113 113(H)  BUN 7.0 - 26.0 mg/dL 11.1 9.2 12  Creatinine 0.7 - 1.3 mg/dL 0.9 0.8 0.67  Sodium 136 - 145 mEq/L 142 140 135  Potassium 3.5 - 5.1 mEq/L 4.1 3.9 4.0  Chloride 101 - 111 mmol/L - - 103  CO2 22 - 29 mEq/L 27 26 20(L)  Calcium 8.4 - 10.4 mg/dL 9.7 9.4 8.7(L)  Total Protein 6.4 - 8.3 g/dL 7.7 7.0 6.7  Total Bilirubin 0.20 - 1.20 mg/dL 0.52 0.28 0.6  Alkaline Phos 40 - 150 U/L 87 88 74  AST 5 - 34 U/L 27 21 21   ALT 0 - 55 U/L 31 31 35   CEA:  04/20/2016: <0.5 06/08/2015: <0.5 09/07/2015: <0.5 11/15/2015: 0.9 02/16/2016: <1.0  04/25/2016: 1.6 05/23/16: <1.00 06/20/2016: 1.05 07/22/2016: <1.00 09/17/16: <1.00 11/14/16: 1.60 02/10/17: 5.94 03/20/2017: 4.92 05/01/17: PENDING   PATHOLOGY REPORT  Diagnosis 11/02/2015 1. Colon, segmental resection for tumor, sigmoid ADENOCARCINOMA OF THE  SIGMOID COLON (2.0 CM), GRADE 3 THE TUMOR INVADES MUSCULARIS PROPRIA MARGINS  OF RESECTION ARE NEGATIVE METASTATIC ADENOCARCINOMA IN THREE OF THIRTEEN LYMPH NODES (3/13) 2. Colon, resection margin (donut), distal sigmoid BENIGN COLONIC TISSUE Microscopic Comment 1. COLON AND RECTUM (INCLUDING TRANS-ANAL RESECTION): Specimen: Sigmoid Procedure: Segmental resection Tumor site: sigmoid Specimen integrity: Intact Macroscopic intactness of mesorectum: Not applicable: x Complete: NA Near complete: NA Incomplete: NA Cannot be determined (specify): NA Macroscopic tumor perforation: Muscularis Invasive tumor: Maximum size: 2.0 cm Histologic type(s): Adenocarcinoma Histologic grade and differentiation: G3 G1: well differentiated/low grade G2: moderately differentiated/low grade G3: poorly differentiated/high grade G4: undifferentiated/high grade Type of polyp in which invasive carcinoma arose: Tubular adenoma Microscopic extension of invasive tumor: Muscularis propria Lymph-Vascular invasion: Negative Peri-neural invasion: Negative Tumor deposit(s) (discontinuous extramural extension): Negative Resection margins: Proximal margin: Negative Distal margin: Negative Circumferential (radial) (posterior ascending, posterior descending; lateral and posterior mid-rectum; and entire lower 1/3 rectum):Negative Mesenteric margin (sigmoid and transverse): Negative Distance closest margin (if all above margins negative): 3.5 cm Trans-anal resection margins only: Deep margin: NA Mucosal Margin: NA Distance closest mucosal margin (if negative): NA Treatment effect (neo-adjuvant therapy): Partial Additional polyp(s): None Non-neoplastic findings: unremarkable Lymph nodes: number examined 13; number positive: 3 Pathologic Staging: T2, N1b, M1a     RADIOGRAPHIC STUDIES: I have personally reviewed the radiological images as listed and agreed with the findings in the report.  PET  04/12/2015 IMPRESSION: Hypermetabolic colonic mass near rectosigmoid junction, consistent with known primary colon carcinoma. Tiny sub-cm pericolonic lymph nodes in sigmoid mesocolon are too small to characterize by PET, but are suspicious for early lymph node metastases. Bilateral pulmonary metastases  PET 09/18/2015 IMPRESSION: 1. Hypermetabolic pulmonary metastases have enlarged slightly from 07/18/2015. 2. Hypermetabolic porta hepatis/abdominal peritoneal ligament lymph nodes, stable from 04/12/2015. 3. Increase in hypermetabolism associated with mesenteric haziness and nodularity. While a reactive phenomenon/panniculitis can create this in appearance, metastatic disease/lymphoproliferative disorder cannot be excluded. 4. Hepatic steatosis. 5. Bilateral nephrolithiasis.  CT chest, abdomen and pelvis with contrast 06/27/2016 IMPRESSION: Status post partial left hemicolectomy. Progressive wall thickening involving the colon near the suture line, worrisome for residual tumor. Adjacent 7 mm short axis pericolonic lymph node, possibly reflecting a nodal metastasis. Radiation changes in the posterior right upper lobe and superior segment right lower lobe. Progressive pulmonary metastases bilaterally, measuring up to 13 mm, as above.   CT A/P/C w contrast 09/17/16:  IMPRESSION: 1. Multifocal pulmonary nodules are not significantly changed in size compared with the previous exam. 2. Stable appearance of radiation changes within the right midlung. 3. Stable to scratch set improved appearance of wall thickening involving the colon at the level of the suture line.   CT Chest WO Contrast 11/11/16: IMPRESSION: 1. Minimal enlargement of some pulmonary metastases. 2. Coronary artery calcification. 3. Hepatic steatosis.   CT CAP W Contrast 02/10/17 IMPRESSION: 1. Progressive metastatic disease to the thorax as demonstrated by increased size of numerous previously noted  pulmonary nodules, what appears to be lymphangitic spread of disease developing in the right upper lobe and superior segment of the right lower lobe, and new 1.5 cm short axis prevascular lymph node. 2. No definite findings of metastatic disease in the abdomen or pelvis. 3. Multiple tiny nonobstructive calculi in the collecting systems of the kidneys bilaterally measuring 2-3 mm in size. No ureteral stones or findings of urinary tract obstruction are noted at this time. 4. Hepatic steatosis. 5. Aortic atherosclerosis, in addition to left anterior descending coronary artery disease. Please note that although the presence of coronary artery calcium documents the presence of coronary  artery disease, the severity of this disease and any potential stenosis cannot be assessed on this non-gated CT examination. Assessment for potential risk factor modification, dietary therapy or pharmacologic therapy may be warranted, if clinically indicated. 6. There are calcifications of the aortic valve. Echocardiographic correlation for evaluation of potential valvular dysfunction may be warranted if clinically indicated.   ASSESSMENT & PLAN:  55 y.o. male, without significant past medical history except kidney stone, presented with intermittent bloody stool for 2 years, and colonoscopy showed a large sigmoid colon mass, CT scan showed multiple (at least 4) nodules in bilateral lungs, measuring about 1 cm.  1. Sigmoid colon adenocarcinoma, pT2N1bM1, stage IV with lung mets, KRA/NRAS wild type, MSI-stable -I previously reviewed his colonoscopy, initial CT scan findings and the biopsy results in great details with patient and his wife. -he received first line chemo FOLFOX and panitumumab, followed by hemicolectomy, SBRT to 4 lung nodules, and second line chemo irinotecan and avastin, chemotherapy was finally stopped due to his poor tolerance. -I previously reviewed his surgical pathology findings, which showed  a residual T2 primary tumor, 3 lymph nodes positive, grade 3 disease, certainly high risk disease.  -We previously discussed that his disease is likely incurable, and the goal of therapy is maximum disease control and prolong his life.  -- His tumor does not contain KRAS/NRAS or BRAF mutation, so he would benefit from EGFR antibody, Panitumumab was added on from cycle 4 but tolerated poorly dur to skin rash  -He is off chemotherapy now, clinically doing well, with mild dry cough and dyspnea, otherwise asymptomatic. -I previously reviewed his 02/10/17 restaging CT chest, which showed significant increase the size of multiple lung nodules, and possible lymphogenic spread of disease in the right upper lobe and superior segment of right lower lobe.   -I strongly recommend him to resume chemotherapy due to his disease progression, he is also moderately symptomatically. Patient has been previously resistant to chemotherapy previously due to poor tolerance. After lengthy discussion, patient agreed to restart chemotherapy. -We previously discussed his current chemo therapy options. He previously received CAPOX and FOLFIRI, no disease progression on chemo. Based on side effects Pt prefers to resume CAPOX every 3 weeks and Panitumumab every 2 weeks, which was his first line chemo. Due to his tolerance issue, will slightly decrease his dose.  -he tolerated the Xeloda poorly, and refused to continue. We'll changed to 5-FU, however he declined 5-FU pump infusion, we'll changed to weekly 5-FU bolus with leucovorin on day 8 and 15 of 21 day cycle, with oxaliplatin on day 1. He refuses to have his intravenous chemotherapy on same day.  -Unfortunately he previously developed anaphylactic reaction to oxaliplatin with cardiac arrest on 03/20/2017 and was successfully resuscitated, chemotherapy will be held for now. -no more oxaliplatin due to cardiac arrest  -Because His port had no blood return, He had port exchanged  -We  restart Panitumumab every 2 weeks on 05/01/17, weekly 5-Fu bolus will not be added in a few months when he recovers better  -F/u on 11/23   2. Cardiac arrest on 03/20/2017 -Secondary to oxaliplatin -He underwent cardiac catheterization, which was negative for coronary artery disease -He will continue follow-up with his cardiologist Dr. Dorthy Cooler  -He is on metoprolol, continues to follow cardiologist   3. Dry cough and dyspnea  -His initial cough is likely related to his SBRT,he received a course of prednisone  -I'm concerned his dry cough to be related to his cancer progression in the lung  -  previously Much improved since he started chemotherapy -No longer has Dyspnea but his dry cough remains, he is taking cough syrup.   4. Obesity  -I again encouraged him to eat healthy and exercise   5. Anxiety  -He has been having palpitation, sweating episodes, after his cardiac arrest event. Possible anxiety attacks  - I suggested he try low dose Xanax to help with his anxiety, he refuses at this time  6. Left leg neuropathy -Possibly related to chemotherapy, also not very typical  -I suggest him to take over-the-counter vitamin B complex  7. Goal of care discussion  -We again discussed the incurable nature of his cancer, although his cancer seems to be more indolent -The patient understands the goal of care is palliative. -He is full code now   8. Pain medication -He has been taking Oxycodone once a day as needed due to his high intensity job and some chest discomfort which could be related to stress.  -He really does not have much cancer related pain. We discussed narcotic addiction, I recommend him to gradually wean off oxycodone. He tried but not successful    PLAN -Proceed to restart Vectibix today -Lab, flush, vectibix in 2 ad 4 weeks (11/23) -F/u on 11/23    I spent 20 minutes counseling the patient face to face. The total time spent in the appointment was 25 minutes and more  than 50% was on counseling.  This document serves as a record of services personally performed by Truitt Merle, MD. It was created on her behalf by Bill Wright, a trained medical scribe. The creation of this record is based on the scribe's personal observations and the provider's statements to them. This document has been checked and approved by the attending provider.    I have reviewed the above documentation for accuracy and completeness and I agree with the above.   Truitt Merle, MD 05/01/2017

## 2017-05-02 ENCOUNTER — Telehealth: Payer: Self-pay | Admitting: Hematology

## 2017-05-02 NOTE — Telephone Encounter (Signed)
Faxed completed FMLA forms to [employer] on 05/02/2017 to [phone]. Called patient to let them know.

## 2017-05-03 ENCOUNTER — Encounter: Payer: Self-pay | Admitting: Hematology

## 2017-05-07 ENCOUNTER — Telehealth: Payer: Self-pay | Admitting: Hematology

## 2017-05-07 NOTE — Telephone Encounter (Signed)
Scheduled appt per 10/24 los - patient is aware of appts added

## 2017-05-15 NOTE — Progress Notes (Signed)
[facility]  [phone] [phone]) [phone]  Clinic Follow Up Note    05/16/2017  CHIEF COMPLAINTS:  Follow Up colon cancer  Oncology History   T2, Colon cancer metastasized to lung [facility])   Staging form: Colon and Rectum, AJCC 7th Edition     Clinical stage from 03/30/2015: Stage Unknown ([place], N1, M1) - Unsigned       Colon cancer metastasized to lung [facility]) s/p laparoscopic assisted sigmoid colectomy 11/02/15   03/30/2015 Miscellaneous    Foundation one genomic testing showed TP53 mutation, MSI stable, low tumor mutation burden. Negative for K-ras, NRAS and BRAF      03/30/2015 Initial Biopsy    Sigmoid mass biopsy showed invasive adenocarcinoma. Cecal colon polyps showed tubular adenoma.      03/30/2015 Initial Diagnosis    Colon cancer      03/30/2015 Procedure    colonoscopy by Dr. Michail Sermon showed a fungating, infiltrative and ulcerated nonobstructing large mass in the sigmoid colon and at 20 cm proximal to the anus. The mass was partially circumferential no bleeding. A 10 mm polyps in the cecum was removed.      04/03/2015 Imaging    CT chest, abdomen and pelvis with contrast showed nodular masslike area of clinical worsening at rectosigmoid junction, tiny pericolonic lymph nodes, bilateral pulmonary nodules measuring about 1 cm.      04/12/2015 PET scan    Hypermetabolic colonic mass near rectosigmoid junction, tiny subcentimeter paracolonic lymph nodes. Bilateral pulmonary metastasis.      04/19/2015 Procedure    CT-guided lung nodule biopsy attempted, unsuccessful.      04/27/2015 - 09/14/2015 Chemotherapy    Oxaliplatin 130 mg/m on day 1, Capecitabine 2341m q12hr, 2 weeks on and one week off (only received 7 days for first cycle), oxaliplatin held on cycle 5 and dose reduced to 1048mm2, capecitabine reduced to 200043m12h D1-14, stopped per pt's request.       06/29/2015 - 10/19/2015 Chemotherapy     Panitumumab every 2 weeks, some  cycles were postponed due to pt's request, stoppe per pt's request       09/18/2015 Imaging    Pulmonary nodules are less hypermetabolic, stable size. Stable hypermetabolic portal hepatis and abdominal peritoneal ligament lymph nodes. No other new lesions.       11/02/2015 Surgery    sigmoid colon segmental resection       11/02/2015 Pathology Results    Sigmoid colon segmental resection showed adenocarcinoma, grade 3,  T2, 3 out of 13 lymph nodes were positive, surgical margins were negative. LVI(-), perineural invasion negative      11/13/2015 Imaging    CT chest, abdomen and pelvis with contrast showed postsurgical changes, mild progression of pulmonary metastasis, measuring up to 15 mm in the right lower lobe.      11/29/2015 - 12/05/2015 Radiation Therapy    SBRT to 4 lung lesions in 3 sessions       04/25/2016 - 08/05/2016 Chemotherapy    FOLFIRI every 2 weeks, and Avastin added from cycle 3, 5-FU held since cycle 2 due to poor tolerance. Chemo was stopped after 3 months due to overall poor tolerance and pt's request to stop.       06/27/2016 Imaging    CT CAP 06/27/16 IMPRESSION: Status post partial left hemicolectomy. Progressive wall thickening involving the colon near the suture line, worrisome for residual tumor. Adjacent 7 mm short axis pericolonic lymph node, possibly reflecting a nodal metastasis. Radiation changes in the posterior  right upper lobe and superior segment right lower lobe. Progressive pulmonary metastases bilaterally, measuring up to 13 mm, as above.      09/17/2016 Imaging    CT A/P/C w contrast  IMPRESSION: 1. Multifocal pulmonary nodules are not significantly changed in size compared with the previous exam. 2. Stable appearance of radiation changes within the right midlung. 3. Stable to scratch set improved appearance of wall thickening involving the colon at the level of the suture line.       11/11/2016 Imaging    CT Chest WO Contrast  11/11/16: IMPRESSION: 1. Minimal enlargement of some pulmonary metastases. 2. Coronary artery calcification. 3. Hepatic steatosis.       02/10/2017 Imaging    CT CAP W Contrast 02/10/17 IMPRESSION: 1. Progressive metastatic disease to the thorax as demonstrated by increased size of numerous previously noted pulmonary nodules, what appears to be lymphangitic spread of disease developing in the right upper lobe and superior segment of the right lower lobe, and new 1.5 cm short axis prevascular lymph node. 2. No definite findings of metastatic disease in the abdomen or pelvis. 3. Multiple tiny nonobstructive calculi in the collecting systems of the kidneys bilaterally measuring 2-3 mm in size. No ureteral stones or findings of urinary tract obstruction are noted at this time. 4. Hepatic steatosis. 5. Aortic atherosclerosis, in addition to left anterior descending coronary artery disease. Please note that although the presence of coronary artery calcium documents the presence of coronary artery disease, the severity of this disease and any potential stenosis cannot be assessed on this non-gated CT examination. Assessment for potential risk factor modification, dietary therapy or pharmacologic therapy may be warranted, if clinically indicated. 6. There are calcifications of the aortic valve. Echocardiographic correlation for evaluation of potential valvular dysfunction may be warranted if clinically indicated.       02/27/2017 -  Chemotherapy    Third-line chemotherapy Xeloda for 2 weeks on and 1 week off with Oxaliplatin every 3 weeks and Panitumumab every 2 weeks on a different day. He developed anaphylactic reaction to oxaliplatin, coded, oxaliplatin was subsequently stopped. He did not to tolerate Xeloda, plan to change to 5-FU bolus weekly, Held chemo since 03/20/17.   Restarted panitumumab every 2 weeks on 05/01/17         HISTORY OF PRESENTING ILLNESS:  Bill Wright 56 y.o.  male is here because of recently newly diagnosed colon cancer.  He has had intermittent bloody stool for 2 years, it has been mild, mixed with stool, patient does not have any abdominal pain, constipation, change of his bowel habits, nausea, weight loss or other symptoms. He did not seek medical attention for this. He went to emergency room on 03/15/2015 for right flank pain, due to his kidney stone. CT scan incidentally found a 11 mm right lower lobe nodule and mucosal edema in the sigmoid colon. He saw his primary care physician, and was referred to GI Dr. Michail Sermon here at he underwent colonoscopy on 03/30/2015, which showed a fungating infiltrative and ulcerated nonobstructing large mass in the sigmoid colon, biopsy showed adenocarcinoma. CT chest abdomen and pelvis showed multiple lung nodules measuring about 1 cm. He was referred to surgeon Dr. Zella Richer, who referred patient to [place] for further workup of his lung nodule and discuss chemotherapy.  He feels very well overall, denies any symptoms. He is a Freight forwarder at [employer], lives with his wife and 3 children. He never had screening colonoscopy prior the reason one, no significant past  medical history, does not see doctors regularly.   CURRENT TREATMENT:  restart panitumumab on 05/01/17 for every 2 weeks   Bill Wright returns for follow-up. He presents to the clinic today noting his rash has worsened and spread to his face and scalp. He uses Elocon cream twice a day. He notes this tones down his rash while other ointments have not worked well for him. He would like more Norco that he takes 1-2 times a week. His pain is mainly in his chest and he will still have daily palpitations. She was suppose to see his cardiologist but visit was canceled. He plans to go back to work in 06/2017 and would like to wait on adding 5-FU. He coughs through out the day. He does not cough at night, it worsens when he talks for a while or when he gets hot.  He takes cough drops and he tried Advair before. He does not think it helped with his cough. He continues to take cough syrup.     MEDICAL HISTORY:  Past Medical History:  Diagnosis Date  . Anxiety    situational due to cancer diagnosis  . Cancer [facility]) 2017   colon-chemo 09/22/15 now surgery  . Hypercholesteremia   . Kidney calculi     SURGICAL HISTORY: Past Surgical History:  Procedure Laterality Date  . COLONOSCOPY  03/30/15  . IR CV LINE INJECTION  04/09/2017  . IR FLUORO GUIDE PORT INSERTION RIGHT  04/15/2017  . IR REMOVAL TUN ACCESS W/ PORT W/O FL MOD SED  04/15/2017  . IR US GUIDE VASC ACCESS RIGHT  04/15/2017    SOCIAL HISTORY: Social History   Social History  . Marital Status: Married    Spouse Name: N/A  . Number of Children: 3, age of 21, 63 and 67   . Years of Education: N/A   Occupational History  . Banker for [employer] [place]    Social History Main Topics  . Smoking status: Never Smoker   . Smokeless tobacco: Not on file  . Alcohol Use: No  . Drug Use: No  . Sexual Activity: Not on file   Other Topics Concern  . Not on file   Social History Narrative    FAMILY HISTORY: Family History  Problem Relation Age of Onset  . Breast cancer Mother 48       +rad and lymph node  . Diabetes Maternal Grandmother        leading to blindness  . Obesity Maternal Aunt   . Stroke Maternal Uncle 43    ALLERGIES:  is allergic to oxaliplatin.  MEDICATIONS:  Current Outpatient Medications  Medication Sig Dispense Refill  . Fluticasone-Salmeterol (ADVAIR DISKUS) 250-50 MCG/DOSE AEPB Inhale 1 puff into the lungs 2 (two) times daily. (Patient taking differently: Inhale 1 puff into the lungs daily. ) 180 each 1  . HYDROcodone-acetaminophen (NORCO/VICODIN) 5-325 MG tablet Take 1 tablet every 6 (six) hours as needed by mouth for moderate pain or severe pain. 20 tablet 0  . metoprolol tartrate (LOPRESSOR) 25 MG tablet Take 1 tablet (25 mg total) by mouth 2 (two) times  daily. 60 tablet 3  . mometasone (ELOCON) 0.1 % cream Apply 1 application topically daily. 45 g 2  . prochlorperazine (COMPAZINE) 10 MG tablet Take 1 tablet (10 mg total) by mouth every 6 (six) hours as needed (NAUSEA). 30 tablet 2  . sildenafil (VIAGRA) 100 MG tablet Take 100 mg by mouth as needed for erectile dysfunction.  5  . clindamycin (CLINDAGEL) 1 % gel Apply 2 (two) times daily topically. 30 g 0  . doxycycline (VIBRA-TABS) 100 MG tablet Take 1 tablet (100 mg total) 2 (two) times daily by mouth. 60 tablet 2   No current facility-administered medications for this visit.     REVIEW OF SYSTEMS:  Constitutional: Denies fevers, chills or abnormal night sweats, no weight loss. Eyes: Denies blurriness of vision, double vision or watery eyes Ears, nose, mouth, throat, and face: Denies mucositis or sore throat Respiratory: Denies dyspnea or wheezes (+) dry cough Cardiovascular: (+) palpitations and chest pain Gastrointestinal:  Denies heartburn or change in bowel habits Skin: increased diffuse rash to face, scalp and upper chest Lymphatics: Denies new lymphadenopathy or easy bruising Neurological:Denies numbness, tingling or new weaknesses MSK: normal Behavioral/Psych: Mood is stable, no new changes  All other systems were reviewed with the patient and are negative.  PHYSICAL EXAMINATION:  ECOG PERFORMANCE STATUS: 1 Vitals:   05/16/17 1344  BP: (!) 148/89  Pulse: 98  Resp: 19  Temp: 98.5 F (36.9 C)  TempSrc: Oral  SpO2: 98%  Weight: 225 lb 11.2 oz (102.4 kg)  Height: 5' 6"  (1.676 m)    GENERAL:alert, no distress and comfortable SKIN: skin color, texture, turgor are normal. mild skin erythema on his cheeks. Diffuse acne-like skin rash on his face, scalp, neck and upper chest, no skin erythema or signs of infection.  EYES: normal, conjunctiva are pink and non-injected, sclera clear OROPHARYNX:no exudate, no erythema and lips, buccal mucosa, and tongue normal  NECK: supple,  thyroid normal size, non-tender, without nodularity LYMPH:  no palpable lymphadenopathy in the cervical, axillary or inguinal LUNGS: clear to auscultation and percussion with normal breathing effort HEART: regular rate & rhythm and no murmurs and no lower extremity edema ABDOMEN:abdomen soft, non-tender and normal bowel sounds. The midline incision has healed well, no discharge or skin erythema  Musculoskeletal:no cyanosis of digits and no clubbing  PSYCH: alert & oriented x 3 with fluent speech NEURO: no focal motor/sensory deficits  LABORATORY DATA:  I have reviewed the data as listed CBC Latest Ref Rng & Units 05/16/2017 05/01/2017 04/15/2017  WBC 4.0 - 10.3 10e3/uL 7.3 7.5 5.1  Hemoglobin 13.0 - 17.1 g/dL 13.1 13.1 13.3  Hematocrit 38.4 - 49.9 % 39.7 39.6 39.4  Platelets 140 - 400 10e3/uL 227 234 283   CMP Latest Ref Rng & Units 05/16/2017 05/01/2017 04/03/2017  Glucose 70 - 140 mg/dl 117 114 113  BUN 7.0 - 26.0 mg/dL 10.5 11.1 9.2  Creatinine 0.7 - 1.3 mg/dL 0.8 0.9 0.8  Sodium 136 - 145 mEq/L 140 142 140  Potassium 3.5 - 5.1 mEq/L 3.8 4.1 3.9  Chloride 101 - 111 mmol/L - - -  CO2 22 - 29 mEq/L 27 27 26   Calcium 8.4 - 10.4 mg/dL 8.9 9.7 9.4  Total Protein 6.4 - 8.3 g/dL 7.1 7.7 7.0  Total Bilirubin 0.20 - 1.20 mg/dL 0.40 0.52 0.28  Alkaline Phos 40 - 150 U/L 89 87 88  AST 5 - 34 U/L 22 27 21   ALT 0 - 55 U/L 28 31 31    CEA:  04/20/2016: <0.5 06/08/2015: <0.5 09/07/2015: <0.5 11/15/2015: 0.9 02/16/2016: <1.0  04/25/2016: 1.6 05/23/16: <1.00 06/20/2016: 1.05 07/22/2016: <1.00 09/17/16: <1.00 11/14/16: 1.60 02/10/17: 5.94 03/20/2017: 4.92 05/01/17: 13.29  PATHOLOGY REPORT  Diagnosis 11/02/2015 1. Colon, segmental resection for tumor, sigmoid ADENOCARCINOMA OF THE SIGMOID COLON (2.0 CM), GRADE 3 THE TUMOR INVADES MUSCULARIS PROPRIA MARGINS OF RESECTION  ARE NEGATIVE METASTATIC ADENOCARCINOMA IN THREE OF THIRTEEN LYMPH NODES (3/13) 2. Colon, resection margin (donut), distal  sigmoid BENIGN COLONIC TISSUE Microscopic Comment 1. COLON AND RECTUM (INCLUDING TRANS-ANAL RESECTION): Specimen: Sigmoid Procedure: Segmental resection Tumor site: sigmoid Specimen integrity: Intact Macroscopic intactness of mesorectum: Not applicable: x Complete: NA Near complete: NA Incomplete: NA Cannot be determined (specify): NA Macroscopic tumor perforation: Muscularis Invasive tumor: Maximum size: 2.0 cm Histologic type(s): Adenocarcinoma Histologic grade and differentiation: G3 G1: well differentiated/low grade G2: moderately differentiated/low grade G3: poorly differentiated/high grade G4: undifferentiated/high grade Type of polyp in which invasive carcinoma arose: Tubular adenoma Microscopic extension of invasive tumor: Muscularis propria Lymph-Vascular invasion: Negative Peri-neural invasion: Negative Tumor deposit(s) (discontinuous extramural extension): Negative Resection margins: Proximal margin: Negative Distal margin: Negative Circumferential (radial) (posterior ascending, posterior descending; lateral and posterior mid-rectum; and entire lower 1/3 rectum):Negative Mesenteric margin (sigmoid and transverse): Negative Distance closest margin (if all above margins negative): 3.5 cm Trans-anal resection margins only: Deep margin: NA Mucosal Margin: NA Distance closest mucosal margin (if negative): NA Treatment effect (neo-adjuvant therapy): Partial Additional polyp(s): None Non-neoplastic findings: unremarkable Lymph nodes: number examined 13; number positive: 3 Pathologic Staging: T2, N1b, M1a     RADIOGRAPHIC STUDIES: I have personally reviewed the radiological images as listed and agreed with the findings in the report.  PET 04/12/2015 IMPRESSION: Hypermetabolic colonic mass near rectosigmoid junction, consistent with known primary colon carcinoma. Tiny sub-cm pericolonic lymph nodes in sigmoid mesocolon are too small to characterize by PET,  but are suspicious for early lymph node metastases. Bilateral pulmonary metastases  PET 09/18/2015 IMPRESSION: 1. Hypermetabolic pulmonary metastases have enlarged slightly from 07/18/2015. 2. Hypermetabolic porta hepatis/abdominal peritoneal ligament lymph nodes, stable from 04/12/2015. 3. Increase in hypermetabolism associated with mesenteric haziness and nodularity. While a reactive phenomenon/panniculitis can create this in appearance, metastatic disease/lymphoproliferative disorder cannot be excluded. 4. Hepatic steatosis. 5. Bilateral nephrolithiasis.  CT chest, abdomen and pelvis with contrast 06/27/2016 IMPRESSION: Status post partial left hemicolectomy. Progressive wall thickening involving the colon near the suture line, worrisome for residual tumor. Adjacent 7 mm short axis pericolonic lymph node, possibly reflecting a nodal metastasis. Radiation changes in the posterior right upper lobe and superior segment right lower lobe. Progressive pulmonary metastases bilaterally, measuring up to 13 mm, as above.   CT A/P/C w contrast 09/17/16:  IMPRESSION: 1. Multifocal pulmonary nodules are not significantly changed in size compared with the previous exam. 2. Stable appearance of radiation changes within the right midlung. 3. Stable to scratch set improved appearance of wall thickening involving the colon at the level of the suture line.   CT Chest WO Contrast 11/11/16: IMPRESSION: 1. Minimal enlargement of some pulmonary metastases. 2. Coronary artery calcification. 3. Hepatic steatosis.   CT CAP W Contrast 02/10/17 IMPRESSION: 1. Progressive metastatic disease to the thorax as demonstrated by increased size of numerous previously noted pulmonary nodules, what appears to be lymphangitic spread of disease developing in the right upper lobe and superior segment of the right lower lobe, and new 1.5 cm short axis prevascular lymph node. 2. No definite findings of  metastatic disease in the abdomen or pelvis. 3. Multiple tiny nonobstructive calculi in the collecting systems of the kidneys bilaterally measuring 2-3 mm in size. No ureteral stones or findings of urinary tract obstruction are noted at this time. 4. Hepatic steatosis. 5. Aortic atherosclerosis, in addition to left anterior descending coronary artery disease. Please note that although the presence of coronary artery calcium documents the presence of coronary artery  disease, the severity of this disease and any potential stenosis cannot be assessed on this non-gated CT examination. Assessment for potential risk factor modification, dietary therapy or pharmacologic therapy may be warranted, if clinically indicated. 6. There are calcifications of the aortic valve. Echocardiographic correlation for evaluation of potential valvular dysfunction may be warranted if clinically indicated.   ASSESSMENT & PLAN:  57 y.o. male, without significant past medical history except kidney stone, presented with intermittent bloody stool for 2 years, and colonoscopy showed a large sigmoid colon mass, CT scan showed multiple (at least 4) nodules in bilateral lungs, measuring about 1 cm.  1. Sigmoid colon adenocarcinoma, pT2N1bM1, stage IV with lung mets, KRA/NRAS wild type, MSI-stable -I previously reviewed his colonoscopy, initial CT scan findings and the biopsy results in great details with patient and his wife. -he received first line chemo FOLFOX and panitumumab, followed by hemicolectomy, SBRT to 4 lung nodules, and second line chemo irinotecan and avastin, chemotherapy was finally stopped due to his poor tolerance. -I previously reviewed his surgical pathology findings, which showed a residual T2 primary tumor, 3 lymph nodes positive, grade 3 disease, certainly high risk disease.  -We previously discussed that his disease is likely incurable, and the goal of therapy is maximum disease control and prolong his  life.  -- His tumor does not contain KRAS/NRAS or BRAF mutation, so he would benefit from EGFR antibody, Panitumumab was added on from cycle 4 but tolerated poorly dur to skin rash  -He is off chemotherapy now, clinically doing well, with mild dry cough and dyspnea, otherwise asymptomatic. -I previously reviewed his 02/10/17 restaging CT chest, which showed significant increase the size of multiple lung nodules, and possible lymphogenic spread of disease in the right upper lobe and superior segment of right lower lobe.   -I strongly recommend him to resume chemotherapy due to his disease progression, he is also moderately symptomatically. Patient has been previously resistant to chemotherapy previously due to poor tolerance. After lengthy discussion, patient agreed to restart chemotherapy. -We previously discussed his current chemo therapy options. He previously received CAPOX and FOLFIRI, no disease progression on chemo. Based on side effects Pt prefers to resume CAPOX every 3 weeks and Panitumumab every 2 weeks, which was his first line chemo. Due to his tolerance issue, will slightly decrease his dose.  -he tolerated the Xeloda poorly, and refused to continue. We'll changed to 5-FU, however he declined 5-FU pump infusion, we'll changed to weekly 5-FU bolus with leucovorin on day 8 and 15 of 21 day cycle, with oxaliplatin on day 1. He refuses to have his intravenous chemotherapy on same day.  -Unfortunately he previously developed anaphylactic reaction to oxaliplatin with cardiac arrest on 03/20/2017 and was successfully resuscitated, chemotherapy will be held for now. -no more oxaliplatin due to cardiac arrest  -We restart Panitumumab every 2 weeks on 05/01/17, weekly 5-Fu bolus will not be added in a few months when he recovers better, pt does not feel he is mentally ready for chemo yet  -His rash reaction to Panitumumab has increased. I have called in clindamycin gel, and doxycycline -He will repeat CT  CAP at the end of 05/2017 -F/u on 11/23   2. Cardiac arrest on 03/20/2017 -Secondary to oxaliplatin -He underwent cardiac catheterization, which was negative for coronary artery disease -He will continue follow-up with his cardiologist Dr. Dorthy Cooler  -He is on metoprolol, continues to follow cardiologist   3. Dry cough and dyspnea  -His initial cough is likely related to  his SBRT, he received a course of prednisone  -I'm concerned his dry cough to be related to his cancer progression in the lung  -previously Much improved since he started chemotherapy -No longer has Dyspnea but his dry cough remains, he is taking cough syrup.   4. Obesity  -I again encouraged him to eat healthy and exercise   5. Anxiety  -He has been having palpitation, sweating episodes, after his cardiac arrest event. Possible anxiety attacks  - I suggested he try low dose Xanax to help with his anxiety, he refuses at this time  6. Left leg neuropathy -Possibly related to chemotherapy, also not very typical  -I suggest him to take over-the-counter vitamin B complex  7. Goal of care discussion  -We again discussed the incurable nature of his cancer, although his cancer seems to be more indolent -The patient understands the goal of care is palliative. -He is full code now   8. Pain medication -He has been taking Oxycodone once a day as needed due to his high intensity job and some chest discomfort which could be related to stress.  -He really does not have much cancer related pain. We discussed narcotic addiction, I recommend him to gradually wean off oxycodone. He tried but not successful  -He is down to 1-2 hydrocodone weekly, refilled today   9. Rash -Secondary to Panitumumab -I prescribed Clindamycin and Doxycycline  -I suggest he not take multiple topical treatments during the same applications and no more than twice daily.    PLAN -Refill hydrocodone today  -Prescribe Clindamycin gel and doxycycline  172m bid today for his rash -Lab, flush, f/u and panitumumab on 11/23   I spent 20 minutes counseling the patient face to face. The total time spent in the appointment was 25 minutes and more than 50% was on counseling.  This document serves as a record of services personally performed by YTruitt Merle MD. It was created on her behalf by AJoslyn Devon a trained medical scribe. The creation of this record is based on the scribe's personal observations and the provider's statements to them.    I have reviewed the above documentation for accuracy and completeness, and I agree with the above.  I have reviewed the above documentation for accuracy and completeness and I agree with the above.   FTruitt Merle MD 05/16/2017

## 2017-05-16 ENCOUNTER — Ambulatory Visit (HOSPITAL_BASED_OUTPATIENT_CLINIC_OR_DEPARTMENT_OTHER): Payer: BLUE CROSS/BLUE SHIELD

## 2017-05-16 ENCOUNTER — Ambulatory Visit: Payer: BLUE CROSS/BLUE SHIELD

## 2017-05-16 ENCOUNTER — Other Ambulatory Visit (HOSPITAL_BASED_OUTPATIENT_CLINIC_OR_DEPARTMENT_OTHER): Payer: BLUE CROSS/BLUE SHIELD

## 2017-05-16 ENCOUNTER — Ambulatory Visit (HOSPITAL_BASED_OUTPATIENT_CLINIC_OR_DEPARTMENT_OTHER): Payer: BLUE CROSS/BLUE SHIELD | Admitting: Hematology

## 2017-05-16 VITALS — BP 148/89 | HR 98 | Temp 98.5°F | Resp 19 | Ht 66.0 in | Wt 225.7 lb

## 2017-05-16 DIAGNOSIS — C189 Malignant neoplasm of colon, unspecified: Secondary | ICD-10-CM

## 2017-05-16 DIAGNOSIS — C187 Malignant neoplasm of sigmoid colon: Secondary | ICD-10-CM | POA: Diagnosis not present

## 2017-05-16 DIAGNOSIS — F419 Anxiety disorder, unspecified: Secondary | ICD-10-CM | POA: Diagnosis not present

## 2017-05-16 DIAGNOSIS — E669 Obesity, unspecified: Secondary | ICD-10-CM

## 2017-05-16 DIAGNOSIS — G629 Polyneuropathy, unspecified: Secondary | ICD-10-CM | POA: Diagnosis not present

## 2017-05-16 DIAGNOSIS — C7801 Secondary malignant neoplasm of right lung: Secondary | ICD-10-CM

## 2017-05-16 DIAGNOSIS — R05 Cough: Secondary | ICD-10-CM

## 2017-05-16 DIAGNOSIS — R0600 Dyspnea, unspecified: Secondary | ICD-10-CM | POA: Diagnosis not present

## 2017-05-16 DIAGNOSIS — Z5112 Encounter for antineoplastic immunotherapy: Secondary | ICD-10-CM | POA: Diagnosis not present

## 2017-05-16 DIAGNOSIS — C7802 Secondary malignant neoplasm of left lung: Secondary | ICD-10-CM

## 2017-05-16 DIAGNOSIS — Z95828 Presence of other vascular implants and grafts: Secondary | ICD-10-CM

## 2017-05-16 DIAGNOSIS — L270 Generalized skin eruption due to drugs and medicaments taken internally: Secondary | ICD-10-CM | POA: Diagnosis not present

## 2017-05-16 DIAGNOSIS — C7800 Secondary malignant neoplasm of unspecified lung: Principal | ICD-10-CM

## 2017-05-16 LAB — MAGNESIUM: Magnesium: 2.2 mg/dl (ref 1.5–2.5)

## 2017-05-16 LAB — CBC WITH DIFFERENTIAL/PLATELET
BASO%: 0.7 % (ref 0.0–2.0)
Basophils Absolute: 0 10*3/uL (ref 0.0–0.1)
EOS ABS: 0.2 10*3/uL (ref 0.0–0.5)
EOS%: 2.7 % (ref 0.0–7.0)
HEMATOCRIT: 39.7 % (ref 38.4–49.9)
HEMOGLOBIN: 13.1 g/dL (ref 13.0–17.1)
LYMPH#: 1.3 10*3/uL (ref 0.9–3.3)
LYMPH%: 18.1 % (ref 14.0–49.0)
MCH: 28.7 pg (ref 27.2–33.4)
MCHC: 32.9 g/dL (ref 32.0–36.0)
MCV: 87.2 fL (ref 79.3–98.0)
MONO#: 0.8 10*3/uL (ref 0.1–0.9)
MONO%: 11.5 % (ref 0.0–14.0)
NEUT%: 67 % (ref 39.0–75.0)
NEUTROS ABS: 4.9 10*3/uL (ref 1.5–6.5)
PLATELETS: 227 10*3/uL (ref 140–400)
RBC: 4.55 10*6/uL (ref 4.20–5.82)
RDW: 14.5 % (ref 11.0–14.6)
WBC: 7.3 10*3/uL (ref 4.0–10.3)

## 2017-05-16 LAB — COMPREHENSIVE METABOLIC PANEL
ALT: 28 U/L (ref 0–55)
AST: 22 U/L (ref 5–34)
Albumin: 3.4 g/dL — ABNORMAL LOW (ref 3.5–5.0)
Alkaline Phosphatase: 89 U/L (ref 40–150)
Anion Gap: 8 mEq/L (ref 3–11)
BUN: 10.5 mg/dL (ref 7.0–26.0)
CALCIUM: 8.9 mg/dL (ref 8.4–10.4)
CHLORIDE: 105 meq/L (ref 98–109)
CO2: 27 mEq/L (ref 22–29)
Creatinine: 0.8 mg/dL (ref 0.7–1.3)
EGFR: >60 mL/min/{1.73_m2} (ref >60)
Glucose: 117 mg/dl (ref 70–140)
POTASSIUM: 3.8 meq/L (ref 3.5–5.1)
SODIUM: 140 meq/L (ref 136–145)
Total Bilirubin: 0.4 mg/dL (ref 0.20–1.20)
Total Protein: 7.1 g/dL (ref 6.4–8.3)

## 2017-05-16 MED ORDER — SODIUM CHLORIDE 0.9 % IV SOLN
Freq: Once | INTRAVENOUS | Status: AC
  Administered 2017-05-16: 15:00:00 via INTRAVENOUS

## 2017-05-16 MED ORDER — SODIUM CHLORIDE 0.9 % IV SOLN
6.0000 mg/kg | Freq: Once | INTRAVENOUS | Status: AC
  Administered 2017-05-16: 600 mg via INTRAVENOUS
  Filled 2017-05-16: qty 10

## 2017-05-16 MED ORDER — SODIUM CHLORIDE 0.9% FLUSH
10.0000 mL | INTRAVENOUS | Status: DC | PRN
  Administered 2017-05-16: 10 mL via INTRAVENOUS
  Filled 2017-05-16: qty 10

## 2017-05-16 MED ORDER — HYDROCODONE-ACETAMINOPHEN 5-325 MG PO TABS: 1.0000 | ORAL_TABLET | Freq: Four times a day (QID) | ORAL | 0 refills | Status: DC | PRN

## 2017-05-16 MED ORDER — HEPARIN SOD (PORK) LOCK FLUSH 100 UNIT/ML IV SOLN
500.0000 [IU] | Freq: Once | INTRAVENOUS | Status: AC | PRN
  Administered 2017-05-16: 500 [IU]
  Filled 2017-05-16: qty 5

## 2017-05-16 MED ORDER — DOXYCYCLINE HYCLATE 100 MG PO TABS: 100.0000 mg | ORAL_TABLET | Freq: Two times a day (BID) | ORAL | 2 refills | Status: DC

## 2017-05-16 MED ORDER — SODIUM CHLORIDE 0.9% FLUSH
10.0000 mL | INTRAVENOUS | Status: DC | PRN
  Administered 2017-05-16: 10 mL
  Filled 2017-05-16: qty 10

## 2017-05-16 MED ORDER — CLINDAMYCIN PHOSPHATE 1 % EX GEL: Freq: Two times a day (BID) | CUTANEOUS | 0 refills | Status: DC

## 2017-05-16 NOTE — Patient Instructions (Signed)
[facility] Discharge Instructions for Patients Receiving Chemotherapy  Today you received the following chemotherapy agents: Vectibix.  To help prevent nausea and vomiting after your treatment, we encourage you to take your nausea medication as directed.   If you develop nausea and vomiting that is not controlled by your nausea medication, call the clinic.   BELOW ARE SYMPTOMS THAT SHOULD BE REPORTED IMMEDIATELY:  *FEVER GREATER THAN 100.5 F  *CHILLS WITH OR WITHOUT FEVER  NAUSEA AND VOMITING THAT IS NOT CONTROLLED WITH YOUR NAUSEA MEDICATION  *UNUSUAL SHORTNESS OF BREATH  *UNUSUAL BRUISING OR BLEEDING  TENDERNESS IN MOUTH AND THROAT WITH OR WITHOUT PRESENCE OF ULCERS  *URINARY PROBLEMS  *BOWEL PROBLEMS  UNUSUAL RASH Items with * indicate a potential emergency and should be followed up as soon as possible.  Feel free to call the clinic should you have any questions or concerns. The clinic phone number is [phone].  Please show the CHEMO ALERT CARD at check-in to the Emergency Department and triage nurse.   

## 2017-05-17 ENCOUNTER — Encounter: Payer: Self-pay | Admitting: Hematology

## 2017-05-22 ENCOUNTER — Telehealth: Payer: Self-pay | Admitting: Hematology

## 2017-05-22 NOTE — Telephone Encounter (Signed)
05/22/2017 mailed and e-mailed office notes and diagnostic testing reports dating from 03/20/2017 to present from Dr. Burr Medico per [place] request to [email or web address] and address [employer] [place] [address] [place] [address]

## 2017-05-28 ENCOUNTER — Ambulatory Visit: Payer: BLUE CROSS/BLUE SHIELD

## 2017-05-28 ENCOUNTER — Other Ambulatory Visit: Payer: Self-pay | Admitting: Hematology

## 2017-05-28 ENCOUNTER — Ambulatory Visit: Payer: BLUE CROSS/BLUE SHIELD | Admitting: Hematology

## 2017-05-28 ENCOUNTER — Other Ambulatory Visit: Payer: BLUE CROSS/BLUE SHIELD

## 2017-05-30 ENCOUNTER — Ambulatory Visit (HOSPITAL_BASED_OUTPATIENT_CLINIC_OR_DEPARTMENT_OTHER): Payer: BLUE CROSS/BLUE SHIELD

## 2017-05-30 ENCOUNTER — Other Ambulatory Visit (HOSPITAL_BASED_OUTPATIENT_CLINIC_OR_DEPARTMENT_OTHER): Payer: BLUE CROSS/BLUE SHIELD

## 2017-05-30 ENCOUNTER — Ambulatory Visit: Payer: BLUE CROSS/BLUE SHIELD | Admitting: Nurse Practitioner

## 2017-05-30 ENCOUNTER — Ambulatory Visit: Payer: BLUE CROSS/BLUE SHIELD

## 2017-05-30 VITALS — BP 126/80 | HR 94 | Temp 97.1°F | Resp 20 | Wt 225.2 lb

## 2017-05-30 DIAGNOSIS — C7800 Secondary malignant neoplasm of unspecified lung: Principal | ICD-10-CM

## 2017-05-30 DIAGNOSIS — Z95828 Presence of other vascular implants and grafts: Secondary | ICD-10-CM

## 2017-05-30 DIAGNOSIS — C7801 Secondary malignant neoplasm of right lung: Secondary | ICD-10-CM | POA: Diagnosis not present

## 2017-05-30 DIAGNOSIS — Z5112 Encounter for antineoplastic immunotherapy: Secondary | ICD-10-CM | POA: Diagnosis not present

## 2017-05-30 DIAGNOSIS — C7802 Secondary malignant neoplasm of left lung: Secondary | ICD-10-CM | POA: Diagnosis not present

## 2017-05-30 DIAGNOSIS — C189 Malignant neoplasm of colon, unspecified: Secondary | ICD-10-CM

## 2017-05-30 DIAGNOSIS — C187 Malignant neoplasm of sigmoid colon: Secondary | ICD-10-CM | POA: Diagnosis not present

## 2017-05-30 LAB — CBC WITH DIFFERENTIAL/PLATELET
BASO%: 0.2 % (ref 0.0–2.0)
Basophils Absolute: 0 10*3/uL (ref 0.0–0.1)
EOS%: 1.3 % (ref 0.0–7.0)
Eosinophils Absolute: 0.1 10*3/uL (ref 0.0–0.5)
HEMATOCRIT: 41.5 % (ref 38.4–49.9)
HEMOGLOBIN: 13.6 g/dL (ref 13.0–17.1)
LYMPH#: 1.4 10*3/uL (ref 0.9–3.3)
LYMPH%: 15 % (ref 14.0–49.0)
MCH: 28.9 pg (ref 27.2–33.4)
MCHC: 32.8 g/dL (ref 32.0–36.0)
MCV: 88.3 fL (ref 79.3–98.0)
MONO#: 0.8 10*3/uL (ref 0.1–0.9)
MONO%: 9.1 % (ref 0.0–14.0)
NEUT#: 6.7 10*3/uL — ABNORMAL HIGH (ref 1.5–6.5)
NEUT%: 74.4 % (ref 39.0–75.0)
Platelets: 239 10*3/uL (ref 140–400)
RBC: 4.7 10*6/uL (ref 4.20–5.82)
RDW: 13.5 % (ref 11.0–14.6)
WBC: 9 10*3/uL (ref 4.0–10.3)

## 2017-05-30 LAB — COMPREHENSIVE METABOLIC PANEL
ALBUMIN: 3.4 g/dL — AB (ref 3.5–5.0)
ALK PHOS: 81 U/L (ref 40–150)
ALT: 25 U/L (ref 0–55)
AST: 21 U/L (ref 5–34)
Anion Gap: 10 mEq/L (ref 3–11)
BUN: 10.5 mg/dL (ref 7.0–26.0)
CHLORIDE: 103 meq/L (ref 98–109)
CO2: 27 meq/L (ref 22–29)
Calcium: 9.1 mg/dL (ref 8.4–10.4)
Creatinine: 0.9 mg/dL (ref 0.7–1.3)
GLUCOSE: 138 mg/dL (ref 70–140)
POTASSIUM: 3.7 meq/L (ref 3.5–5.1)
SODIUM: 140 meq/L (ref 136–145)
Total Bilirubin: 0.41 mg/dL (ref 0.20–1.20)
Total Protein: 7.3 g/dL (ref 6.4–8.3)

## 2017-05-30 LAB — MAGNESIUM: Magnesium: 2.2 mg/dl (ref 1.5–2.5)

## 2017-05-30 LAB — CEA (IN HOUSE-CHCC): CEA (CHCC-IN HOUSE): 19.94 ng/mL — AB (ref 0.00–5.00)

## 2017-05-30 MED ORDER — SODIUM CHLORIDE 0.9% FLUSH
10.0000 mL | INTRAVENOUS | Status: DC | PRN
  Administered 2017-05-30: 10 mL via INTRAVENOUS
  Filled 2017-05-30: qty 10

## 2017-05-30 MED ORDER — HEPARIN SOD (PORK) LOCK FLUSH 100 UNIT/ML IV SOLN
500.0000 [IU] | Freq: Once | INTRAVENOUS | Status: AC | PRN
  Administered 2017-05-30: 500 [IU]
  Filled 2017-05-30: qty 5

## 2017-05-30 MED ORDER — SODIUM CHLORIDE 0.9 % IV SOLN
Freq: Once | INTRAVENOUS | Status: AC
  Administered 2017-05-30: 14:00:00 via INTRAVENOUS

## 2017-05-30 MED ORDER — SODIUM CHLORIDE 0.9% FLUSH
10.0000 mL | INTRAVENOUS | Status: DC | PRN
  Administered 2017-05-30: 10 mL
  Filled 2017-05-30: qty 10

## 2017-05-30 MED ORDER — SODIUM CHLORIDE 0.9 % IV SOLN
6.0000 mg/kg | Freq: Once | INTRAVENOUS | Status: AC
  Administered 2017-05-30: 600 mg via INTRAVENOUS
  Filled 2017-05-30: qty 20

## 2017-05-30 NOTE — Patient Instructions (Signed)
[facility] Discharge Instructions for Patients Receiving Chemotherapy  Today you received the following chemotherapy agents: Vectibix.  To help prevent nausea and vomiting after your treatment, we encourage you to take your nausea medication as directed.   If you develop nausea and vomiting that is not controlled by your nausea medication, call the clinic.   BELOW ARE SYMPTOMS THAT SHOULD BE REPORTED IMMEDIATELY:  *FEVER GREATER THAN 100.5 F  *CHILLS WITH OR WITHOUT FEVER  NAUSEA AND VOMITING THAT IS NOT CONTROLLED WITH YOUR NAUSEA MEDICATION  *UNUSUAL SHORTNESS OF BREATH  *UNUSUAL BRUISING OR BLEEDING  TENDERNESS IN MOUTH AND THROAT WITH OR WITHOUT PRESENCE OF ULCERS  *URINARY PROBLEMS  *BOWEL PROBLEMS  UNUSUAL RASH Items with * indicate a potential emergency and should be followed up as soon as possible.  Feel free to call the clinic should you have any questions or concerns. The clinic phone number is [phone].  Please show the CHEMO ALERT CARD at check-in to the Emergency Department and triage nurse.   

## 2017-05-30 NOTE — Patient Instructions (Signed)

## 2017-06-11 ENCOUNTER — Encounter (HOSPITAL_COMMUNITY): Payer: Self-pay

## 2017-06-11 ENCOUNTER — Ambulatory Visit (HOSPITAL_COMMUNITY)
Admission: RE | Admit: 2017-06-11 | Discharge: 2017-06-11 | Disposition: A | Discharge status: Home / Self Care | Payer: BLUE CROSS/BLUE SHIELD | Source: Ambulatory Visit | Attending: Hematology | Admitting: Hematology

## 2017-06-11 DIAGNOSIS — N200 Calculus of kidney: Secondary | ICD-10-CM | POA: Insufficient documentation

## 2017-06-11 DIAGNOSIS — C189 Malignant neoplasm of colon, unspecified: Secondary | ICD-10-CM | POA: Diagnosis present

## 2017-06-11 DIAGNOSIS — C787 Secondary malignant neoplasm of liver and intrahepatic bile duct: Secondary | ICD-10-CM | POA: Diagnosis not present

## 2017-06-11 DIAGNOSIS — C7800 Secondary malignant neoplasm of unspecified lung: Secondary | ICD-10-CM

## 2017-06-11 MED ORDER — IOPAMIDOL (ISOVUE-300) INJECTION 61%
100.0000 mL | Freq: Once | INTRAVENOUS | Status: AC | PRN
  Administered 2017-06-11: 100 mL via INTRAVENOUS

## 2017-06-11 MED ORDER — HEPARIN SOD (PORK) LOCK FLUSH 100 UNIT/ML IV SOLN
INTRAVENOUS | Status: AC
  Administered 2017-06-11: 500 [IU] via INTRAVENOUS
  Filled 2017-06-11: qty 5

## 2017-06-11 MED ORDER — HEPARIN SOD (PORK) LOCK FLUSH 100 UNIT/ML IV SOLN
500.0000 [IU] | Freq: Once | INTRAVENOUS | Status: AC
  Administered 2017-06-11: 500 [IU] via INTRAVENOUS

## 2017-06-11 MED ORDER — IOPAMIDOL (ISOVUE-300) INJECTION 61%
INTRAVENOUS | Status: AC
  Administered 2017-06-11: 100 mL via INTRAVENOUS
  Filled 2017-06-11: qty 100

## 2017-06-12 NOTE — Progress Notes (Signed)
[place]  [phone] [phone]  Clinic Follow Up Note   Date of Service: 06/13/2017  CHIEF COMPLAINTS:  Follow Up colon cancer  Oncology History   T2, Colon cancer metastasized to lung [facility])   Staging form: Colon and Rectum, AJCC 7th Edition     Clinical stage from 03/30/2015: Stage Unknown ([place], N1, M1) - Unsigned       Colon cancer metastasized to lung [facility]) s/p laparoscopic assisted sigmoid colectomy 11/02/15   03/30/2015 Miscellaneous    Foundation one genomic testing showed TP53 mutation, MSI stable, low tumor mutation burden. Negative for K-ras, NRAS and BRAF      03/30/2015 Initial Biopsy    Sigmoid mass biopsy showed invasive adenocarcinoma. Cecal colon polyps showed tubular adenoma.      03/30/2015 Initial Diagnosis    Colon cancer      03/30/2015 Procedure    colonoscopy by Dr. Michail Sermon showed a fungating, infiltrative and ulcerated nonobstructing large mass in the sigmoid colon and at 20 cm proximal to the anus. The mass was partially circumferential no bleeding. A 10 mm polyps in the cecum was removed.      04/03/2015 Imaging    CT chest, abdomen and pelvis with contrast showed nodular masslike area of clinical worsening at rectosigmoid junction, tiny pericolonic lymph nodes, bilateral pulmonary nodules measuring about 1 cm.      04/12/2015 PET scan    Hypermetabolic colonic mass near rectosigmoid junction, tiny subcentimeter paracolonic lymph nodes. Bilateral pulmonary metastasis.      04/19/2015 Procedure    CT-guided lung nodule biopsy attempted, unsuccessful.      04/27/2015 - 09/14/2015 Chemotherapy    Oxaliplatin 130 mg/m on day 1, Capecitabine 2330m q12hr, 2 weeks on and one week off (only received 7 days for first cycle), oxaliplatin held on cycle 5 and dose reduced to 1083mm2, capecitabine reduced to 200068m12h D1-14, stopped per pt's request.       06/29/2015 - 10/19/2015 Chemotherapy     Panitumumab every 2  weeks, some cycles were postponed due to pt's request, stoppe per pt's request       09/18/2015 Imaging    Pulmonary nodules are less hypermetabolic, stable size. Stable hypermetabolic portal hepatis and abdominal peritoneal ligament lymph nodes. No other new lesions.       11/02/2015 Surgery    sigmoid colon segmental resection       11/02/2015 Pathology Results    Sigmoid colon segmental resection showed adenocarcinoma, grade 3,  T2, 3 out of 13 lymph nodes were positive, surgical margins were negative. LVI(-), perineural invasion negative      11/13/2015 Imaging    CT chest, abdomen and pelvis with contrast showed postsurgical changes, mild progression of pulmonary metastasis, measuring up to 15 mm in the right lower lobe.      11/29/2015 - 12/05/2015 Radiation Therapy    SBRT to 4 lung lesions in 3 sessions       04/25/2016 - 08/05/2016 Chemotherapy    FOLFIRI every 2 weeks, and Avastin added from cycle 3, 5-FU held since cycle 2 due to poor tolerance. Chemo was stopped after 3 months due to overall poor tolerance and pt's request to stop.       06/27/2016 Imaging    CT CAP 06/27/16 IMPRESSION: Status post partial left hemicolectomy. Progressive wall thickening involving the colon near the suture line, worrisome for residual tumor. Adjacent 7 mm short axis pericolonic lymph node, possibly reflecting a nodal metastasis. Radiation changes in  the posterior right upper lobe and superior segment right lower lobe. Progressive pulmonary metastases bilaterally, measuring up to 13 mm, as above.      09/17/2016 Imaging    CT A/P/C w contrast  IMPRESSION: 1. Multifocal pulmonary nodules are not significantly changed in size compared with the previous exam. 2. Stable appearance of radiation changes within the right midlung. 3. Stable to scratch set improved appearance of wall thickening involving the colon at the level of the suture line.       11/11/2016 Imaging    CT Chest WO  Contrast 11/11/16: IMPRESSION: 1. Minimal enlargement of some pulmonary metastases. 2. Coronary artery calcification. 3. Hepatic steatosis.       02/10/2017 Imaging    CT CAP W Contrast 02/10/17 IMPRESSION: 1. Progressive metastatic disease to the thorax as demonstrated by increased size of numerous previously noted pulmonary nodules, what appears to be lymphangitic spread of disease developing in the right upper lobe and superior segment of the right lower lobe, and new 1.5 cm short axis prevascular lymph node. 2. No definite findings of metastatic disease in the abdomen or pelvis. 3. Multiple tiny nonobstructive calculi in the collecting systems of the kidneys bilaterally measuring 2-3 mm in size. No ureteral stones or findings of urinary tract obstruction are noted at this time. 4. Hepatic steatosis. 5. Aortic atherosclerosis, in addition to left anterior descending coronary artery disease. Please note that although the presence of coronary artery calcium documents the presence of coronary artery disease, the severity of this disease and any potential stenosis cannot be assessed on this non-gated CT examination. Assessment for potential risk factor modification, dietary therapy or pharmacologic therapy may be warranted, if clinically indicated. 6. There are calcifications of the aortic valve. Echocardiographic correlation for evaluation of potential valvular dysfunction may be warranted if clinically indicated.       02/27/2017 -  Chemotherapy    Third-line chemotherapy Xeloda for 2 weeks on and 1 week off with Oxaliplatin every 3 weeks and Panitumumab every 2 weeks on a different day. He developed anaphylactic reaction to oxaliplatin, coded, oxaliplatin was subsequently stopped. He did not to tolerate Xeloda, plan to change to 5-FU bolus weekly, Held chemo since 03/20/17.   Restarted panitumumab every 2 weeks on 05/01/17        06/11/2017 Imaging    CT CAP W Contrast 06/11/17    IMPRESSION: 1. Continued marked interval progression of pulmonary metastatic disease with mediastinal and left hilar metastases evident. 2. Interval development of a 4.3 cm metastatic lesion in the dome of the liver. 3. Stable nonobstructing nephrolithiasis.       HISTORY OF PRESENTING ILLNESS:  Bill Wright 56 y.o. male is here because of recently newly diagnosed colon cancer.  He has had intermittent bloody stool for 2 years, it has been mild, mixed with stool, patient does not have any abdominal pain, constipation, change of his bowel habits, nausea, weight loss or other symptoms. He did not seek medical attention for this. He went to emergency room on 03/15/2015 for right flank pain, due to his kidney stone. CT scan incidentally found a 11 mm right lower lobe nodule and mucosal edema in the sigmoid colon. He saw his primary care physician, and was referred to GI Dr. Michail Sermon here at he underwent colonoscopy on 03/30/2015, which showed a fungating infiltrative and ulcerated nonobstructing large mass in the sigmoid colon, biopsy showed adenocarcinoma. CT chest abdomen and pelvis showed multiple lung nodules measuring about 1 cm. He was referred  to surgeon Dr. Zella Richer, who referred patient to [place] for further workup of his lung nodule and discuss chemotherapy.  He feels very well overall, denies any symptoms. He is a Freight forwarder at [employer], lives with his wife and 3 children. He never had screening colonoscopy prior the reason one, no significant past medical history, does not see doctors regularly.   CURRENT TREATMENT:  restarted panitumumab on 05/01/17 for every 2 weeks ended on 06/13/17 due to disease progression. Switch to 5-fu with avastin and leucovorin bolus starting 06/26/17    INTERIM HISTORY  Bill Wright returns for follow-up. He presents to the clinic today accompanied by his wife.  He reports his cough is significant to where it is causing dyspnea. His cough is consistently  and breathing faster.  He notes his current port is not returning blood again.  The coughing is pulling on his port and straining his muscles causing chest pain. He says the more he titrated up on steroids he felt sluggish. This helped his cough but felt weak. His wife notes he overall shape and health has been declining since his cardiac arrest. He plans to have an ECHO next week. His concern is how much stress will certain chemo put on his heart. Outside of his cough he think he would feel fine.      MEDICAL HISTORY:  Past Medical History:  Diagnosis Date  . Anxiety    situational due to cancer diagnosis  . Cancer [facility]) 2017   colon-chemo 09/22/15 now surgery  . Hypercholesteremia   . Kidney calculi     SURGICAL HISTORY: Past Surgical History:  Procedure Laterality Date  . COLONOSCOPY  03/30/15  . IR CV LINE INJECTION  04/09/2017  . IR FLUORO GUIDE PORT INSERTION RIGHT  04/15/2017  . IR REMOVAL TUN ACCESS W/ PORT W/O FL MOD SED  04/15/2017  . IR US GUIDE VASC ACCESS RIGHT  04/15/2017  . LAPAROSCOPIC PARTIAL COLECTOMY N/A 11/02/2015   Procedure: LAPAROSCOPIC ASSISTED SIGMOID COLECTOMY;  Surgeon: Jackolyn Confer, MD;  Location: [facility];  Service: General;  Laterality: N/A;  . LEFT HEART CATH AND CORONARY ANGIOGRAPHY N/A 03/21/2017   Procedure: LEFT HEART CATH AND CORONARY ANGIOGRAPHY;  Surgeon: Dixie Dials, MD;  Location: [place] [facility];  Service: Cardiovascular;  Laterality: N/A;  . PORTACATH PLACEMENT N/A 05/08/2016   Procedure: INSERTION PORT-A-CATH;  Surgeon: Jackolyn Confer, MD;  Location: [facility];  Service: General;  Laterality: N/A;    SOCIAL HISTORY: Social History   Social History  . Marital Status: Married    Spouse Name: N/A  . Number of Children: 3, age of 81, 40 and 26   . Years of Education: N/A   Occupational History  . Banker for [employer] [place]    Social History Main Topics  . Smoking status: Never Smoker   . Smokeless tobacco: Not on file  . Alcohol Use:  No  . Drug Use: No  . Sexual Activity: Not on file   Other Topics Concern  . Not on file   Social History Narrative    FAMILY HISTORY: Family History  Problem Relation Age of Onset  . Breast cancer Mother 36       +rad and lymph node  . Diabetes Maternal Grandmother        leading to blindness  . Obesity Maternal Aunt   . Stroke Maternal Uncle 80    ALLERGIES:  is allergic to oxaliplatin.  MEDICATIONS:  Current Outpatient Medications  Medication Sig Dispense  Refill  . clindamycin (CLINDAGEL) 1 % gel Apply 2 (two) times daily topically. 30 g 0  . doxycycline (VIBRA-TABS) 100 MG tablet Take 1 tablet (100 mg total) 2 (two) times daily by mouth. 60 tablet 2  . Fluticasone-Salmeterol (ADVAIR DISKUS) 250-50 MCG/DOSE AEPB Inhale 1 puff into the lungs 2 (two) times daily. (Patient taking differently: Inhale 1 puff into the lungs daily. ) 180 each 1  . HYDROcodone-acetaminophen (NORCO/VICODIN) 5-325 MG tablet Take 1 tablet by mouth every 6 (six) hours as needed for moderate pain or severe pain. 20 tablet 0  . Hydrocodone-Homatropine 5-1.5 MG TABS Take 1 tablet by mouth every 6 (six) hours as needed. 60 each 0  . metoprolol tartrate (LOPRESSOR) 25 MG tablet Take 1 tablet (25 mg total) by mouth 2 (two) times daily. 60 tablet 3  . mometasone (ELOCON) 0.1 % cream Apply 1 application topically daily. 45 g 2  . prochlorperazine (COMPAZINE) 10 MG tablet Take 1 tablet (10 mg total) by mouth every 6 (six) hours as needed (NAUSEA). 30 tablet 2  . sildenafil (VIAGRA) 100 MG tablet Take 100 mg by mouth as needed for erectile dysfunction.   5   No current facility-administered medications for this visit.     REVIEW OF SYSTEMS:  Constitutional: Denies fevers, chills or abnormal night sweats, no weight loss. Eyes: Denies blurriness of vision, double vision or watery eyes Ears, nose, mouth, throat, and face: Denies mucositis or sore throat Respiratory: Denies wheezes (+) worsening dry cough (+)  SOB Cardiovascular: (+) palpitations and chest pain Gastrointestinal:  Denies heartburn or change in bowel habits Skin: increased diffuse rash to face, scalp and upper chest Lymphatics: Denies new lymphadenopathy or easy bruising Neurological:Denies numbness, tingling or new weaknesses MSK: normal Behavioral/Psych: Mood is stable, no new changes  All other systems were reviewed with the patient and are negative.  PHYSICAL EXAMINATION:  ECOG PERFORMANCE STATUS: 1 Vitals:   06/13/17 1320  BP: 132/60  Pulse: 92  Resp: 18  Temp: 98.6 F (37 C)  TempSrc: Oral  SpO2: 98%  Weight: 224 lb 4.8 oz (101.7 kg)  Height: 5' 6"  (1.676 m)    GENERAL:alert, no distress and comfortable SKIN: skin color, texture, turgor are normal. mild skin erythema on his cheeks. Diffuse acne-like skin rash on his face, scalp, neck and upper chest, no skin erythema or signs of infection.  EYES: normal, conjunctiva are pink and non-injected, sclera clear OROPHARYNX:no exudate, no erythema and lips, buccal mucosa, and tongue normal  NECK: supple, thyroid normal size, non-tender, without nodularity LYMPH:  no palpable lymphadenopathy in the cervical, axillary or inguinal LUNGS: clear to auscultation and percussion with normal breathing effort HEART: regular rate & rhythm and no murmurs and no lower extremity edema ABDOMEN:abdomen soft, non-tender and normal bowel sounds. The midline incision has healed well, no discharge or skin erythema  Musculoskeletal:no cyanosis of digits and no clubbing  PSYCH: alert & oriented x 3 with fluent speech NEURO: no focal motor/sensory deficits  LABORATORY DATA:  I have reviewed the data as listed CBC Latest Ref Rng & Units 06/13/2017 05/30/2017 05/16/2017  WBC 4.0 - 10.3 10e3/uL 8.7 9.0 7.3  Hemoglobin 13.0 - 17.1 g/dL 14.0 13.6 13.1  Hematocrit 38.4 - 49.9 % 42.6 41.5 39.7  Platelets 140 - 400 10e3/uL 260 239 227   CMP Latest Ref Rng & Units 06/13/2017 05/30/2017 05/16/2017   Glucose 70 - 140 mg/dl 94 138 117  BUN 7.0 - 26.0 mg/dL 8.3 10.5 10.5  Creatinine  0.7 - 1.3 mg/dL 0.8 0.9 0.8  Sodium 136 - 145 mEq/L 140 140 140  Potassium 3.5 - 5.1 mEq/L 3.9 3.7 3.8  Chloride 101 - 111 mmol/L - - -  CO2 22 - 29 mEq/L 25 27 27   Calcium 8.4 - 10.4 mg/dL 9.5 9.1 8.9  Total Protein 6.4 - 8.3 g/dL 7.3 7.3 7.1  Total Bilirubin 0.20 - 1.20 mg/dL 0.45 0.41 0.40  Alkaline Phos 40 - 150 U/L 94 81 89  AST 5 - 34 U/L 24 21 22   ALT 0 - 55 U/L 27 25 28     CEA:  04/20/2016: <0.5 06/08/2015: <0.5 09/07/2015: <0.5 11/15/2015: 0.9 02/16/2016: <1.0  04/25/2016: 1.6 05/23/16: <1.00 06/20/2016: 1.05 07/22/2016: <1.00 09/17/16: <1.00 11/14/16: 1.60 02/10/17: 5.94 03/20/2017: 4.92 05/01/17: 13.29 05/30/17: 19.94 06/13/17: 27.88   PATHOLOGY REPORT  Diagnosis 11/02/2015 1. Colon, segmental resection for tumor, sigmoid ADENOCARCINOMA OF THE SIGMOID COLON (2.0 CM), GRADE 3 THE TUMOR INVADES MUSCULARIS PROPRIA MARGINS OF RESECTION ARE NEGATIVE METASTATIC ADENOCARCINOMA IN THREE OF THIRTEEN LYMPH NODES (3/13) 2. Colon, resection margin (donut), distal sigmoid BENIGN COLONIC TISSUE Microscopic Comment 1. COLON AND RECTUM (INCLUDING TRANS-ANAL RESECTION): Specimen: Sigmoid Procedure: Segmental resection Tumor site: sigmoid Specimen integrity: Intact Macroscopic intactness of mesorectum: Not applicable: x Complete: NA Near complete: NA Incomplete: NA Cannot be determined (specify): NA Macroscopic tumor perforation: Muscularis Invasive tumor: Maximum size: 2.0 cm Histologic type(s): Adenocarcinoma Histologic grade and differentiation: G3 G1: well differentiated/low grade G2: moderately differentiated/low grade G3: poorly differentiated/high grade G4: undifferentiated/high grade Type of polyp in which invasive carcinoma arose: Tubular adenoma Microscopic extension of invasive tumor: Muscularis propria Lymph-Vascular invasion: Negative Peri-neural invasion: Negative Tumor  deposit(s) (discontinuous extramural extension): Negative Resection margins: Proximal margin: Negative Distal margin: Negative Circumferential (radial) (posterior ascending, posterior descending; lateral and posterior mid-rectum; and entire lower 1/3 rectum):Negative Mesenteric margin (sigmoid and transverse): Negative Distance closest margin (if all above margins negative): 3.5 cm Trans-anal resection margins only: Deep margin: NA Mucosal Margin: NA Distance closest mucosal margin (if negative): NA Treatment effect (neo-adjuvant therapy): Partial Additional polyp(s): None Non-neoplastic findings: unremarkable Lymph nodes: number examined 13; number positive: 3 Pathologic Staging: T2, N1b, M1a     RADIOGRAPHIC STUDIES: I have personally reviewed the radiological images as listed and agreed with the findings in the report.  PET 04/12/2015 IMPRESSION: Hypermetabolic colonic mass near rectosigmoid junction, consistent with known primary colon carcinoma. Tiny sub-cm pericolonic lymph nodes in sigmoid mesocolon are too small to characterize by PET, but are suspicious for early lymph node metastases. Bilateral pulmonary metastases  PET 09/18/2015 IMPRESSION: 1. Hypermetabolic pulmonary metastases have enlarged slightly from 07/18/2015. 2. Hypermetabolic porta hepatis/abdominal peritoneal ligament lymph nodes, stable from 04/12/2015. 3. Increase in hypermetabolism associated with mesenteric haziness and nodularity. While a reactive phenomenon/panniculitis can create this in appearance, metastatic disease/lymphoproliferative disorder cannot be excluded. 4. Hepatic steatosis. 5. Bilateral nephrolithiasis.  CT chest, abdomen and pelvis with contrast 06/27/2016 IMPRESSION: Status post partial left hemicolectomy. Progressive wall thickening involving the colon near the suture line, worrisome for residual tumor. Adjacent 7 mm short axis pericolonic lymph node, possibly  reflecting a nodal metastasis. Radiation changes in the posterior right upper lobe and superior segment right lower lobe. Progressive pulmonary metastases bilaterally, measuring up to 13 mm, as above.   CT A/P/C w contrast 09/17/16:  IMPRESSION: 1. Multifocal pulmonary nodules are not significantly changed in size compared with the previous exam. 2. Stable appearance of radiation changes within the right midlung. 3. Stable to scratch set improved  appearance of wall thickening involving the colon at the level of the suture line.   CT Chest WO Contrast 11/11/16: IMPRESSION: 1. Minimal enlargement of some pulmonary metastases. 2. Coronary artery calcification. 3. Hepatic steatosis.   CT CAP W Contrast 02/10/17 IMPRESSION: 1. Progressive metastatic disease to the thorax as demonstrated by increased size of numerous previously noted pulmonary nodules, what appears to be lymphangitic spread of disease developing in the right upper lobe and superior segment of the right lower lobe, and new 1.5 cm short axis prevascular lymph node. 2. No definite findings of metastatic disease in the abdomen or pelvis. 3. Multiple tiny nonobstructive calculi in the collecting systems of the kidneys bilaterally measuring 2-3 mm in size. No ureteral stones or findings of urinary tract obstruction are noted at this time. 4. Hepatic steatosis. 5. Aortic atherosclerosis, in addition to left anterior descending coronary artery disease. Please note that although the presence of coronary artery calcium documents the presence of coronary artery disease, the severity of this disease and any potential stenosis cannot be assessed on this non-gated CT examination. Assessment for potential risk factor modification, dietary therapy or pharmacologic therapy may be warranted, if clinically indicated. 6. There are calcifications of the aortic valve. Echocardiographic correlation for evaluation of potential valvular  dysfunction may be warranted if clinically indicated.   CT CAP W Contrast 06/11/17  IMPRESSION: 1. Continued marked interval progression of pulmonary metastatic disease with mediastinal and left hilar metastases evident. 2. Interval development of a 4.3 cm metastatic lesion in the dome of the liver. 3. Stable nonobstructing nephrolithiasis.   ASSESSMENT & PLAN:  56 y.o. male, without significant past medical history except kidney stone, presented with intermittent bloody stool for 2 years, and colonoscopy showed a large sigmoid colon mass, CT scan showed multiple (at least 4) nodules in bilateral lungs, measuring about 1 cm.  1. Sigmoid colon adenocarcinoma, pT2N1bM1, stage IV with lung mets, KRA/NRAS wild type, MSI-stable -I previously reviewed his colonoscopy, initial CT scan findings and the biopsy results in great details with patient and his wife. -he received first line chemo FOLFOX and panitumumab, followed by hemicolectomy, SBRT to 4 lung nodules, and second line chemo irinotecan and avastin, chemotherapy was finally stopped due to his poor tolerance. -I previously reviewed his surgical pathology findings, which showed a residual T2 primary tumor, 3 lymph nodes positive, grade 3 disease, certainly high risk disease.  -We previously discussed that his disease is likely incurable, and the goal of therapy is maximum disease control and prolong his life.  -- His tumor does not contain KRAS/NRAS or BRAF mutation, so he would benefit from EGFR antibody, Panitumumab was added on from cycle 4 but tolerated poorly dur to skin rash  -he has been on and off chemo and panitumumab due to poor tolerance overall  --Unfortunately he previously developed anaphylactic reaction to oxaliplatin with cardiac arrest on 03/20/2017 and was successfully resuscitated, chemotherapy was held -no more oxaliplatin due to cardiac arrest  -We restarted Panitumumab every 2 weeks on 05/01/17, I recommended weekly 5-Fu  bolus but pt does not feel he is mentally ready for chemo yet  -We today reviewed his CT CAP images from 06/11/17 which shows significant growth in his pulmonary metastatic lesions and a new liver met.  -Antibody Panitumumab alone is not controlling this disease. I discussed the treatment options of 5-Fu bolus weekly (he refuses 5-fu pump) with Avastin and leucovorin,  Referral to other cancer center for clinical trail or taking oral biological agent  regorafenib or Lonsurf (5 days a week for 2 weeks and off 2 week).  -I discussed the goal of care is to control his disease, however we are close to exhausting treatment options outside of clinical trails. -After a lengthy discussion, He will proceed with weekly 5-fu bolus with avastin and leucovorin bolus. He will receive last dose panitumumab today.  I will also get Lonsurf ready if he does not tolerate 5-fu well or further progress on 5-fu  -I again discussed the potential side effects from 5-FU (he had before), especially coronary artery spasm.  He previously had cardiac cath which was negative.  He is scheduled to have an echocardiogram next week. -Will scan him every 4-6 weeks to monitor response of treatment. If not responding well we will switch to another regimen. His CEA  is elevated, although not very sensitive, will monitor every 2 weeks, if it starts to trend down we will continue current treatment.  -F/u on 12/20   2. Cardiac arrest on 03/20/2017 -Secondary to oxaliplatin -He underwent cardiac catheterization, which was negative for coronary artery disease -He will continue follow-up with his cardiologist Dr. Dorthy Cooler  -He is on metoprolol, continues to follow cardiologist  -Will have ECHO next week  3. Dry cough and dyspnea  -His initial cough is likely related to his SBRT, he received a course of prednisone  -I'm concerned his dry cough to be related to his cancer progression in the lung  -previously Much improved since he started  chemotherapy -Cough has worsened and is ongoing and aggressive due to cancer progression.  -I prescribed hycodan today (06/13/17)  4. Obesity  -I again encouraged him to eat healthy and exercise   5. Anxiety  -He has been having palpitation, sweating episodes, after his cardiac arrest event. Possible anxiety attacks  - I suggested he try low dose Xanax to help with his anxiety, he refuses at this time  6. Left leg neuropathy -Possibly related to chemotherapy, also not very typical  -I suggest him to take over-the-counter vitamin B complex  7. Goal of care discussion  -We again discussed the incurable nature of his cancer, although his cancer seems to be more indolent -The patient understands the goal of care is palliative. -He is full code now   8.  Intermittent chest pain -He has been taking Oxycodone once a day as needed due to his high intensity job and some chest discomfort which could be related to stress.  -He really does not have much cancer related pain. We discussed narcotic addiction, I recommend him to gradually wean off oxycodone. He tried but not successful  -He is down to 1-2 hydrocodone weekly, refilled on 05/16/17 -I refilled his [place] today (06/13/17)  9. Rash -Secondary to Panitumumab -I prescribed Clindamycin and Doxycycline  -I suggest he not take multiple topical treatments during the same applications and no more than twice daily.  -Rash has improved with doxycycline BID.    PLAN -Prescribe Hycodan and Lonsurf (next line after 5-fu) today  -Refill NORCO today  -Last dose panitumumab today  -change to Avastin the week of 12/20 -F/u on 12/20 with lab and 5-fu bolus    I spent 30 minutes counseling the patient face to face. The total time spent in the appointment was 40 minutes and more than 50% was on counseling.  This document serves as a record of services personally performed by Truitt Merle, MD. It was created on her behalf by Joslyn Devon, a trained  medical scribe.  The creation of this record is based on the scribe's personal observations and the provider's statements to them.    I have reviewed the above documentation for accuracy and completeness, and I agree with the above.   Truitt Merle, MD 06/13/2017

## 2017-06-13 ENCOUNTER — Encounter: Payer: Self-pay | Admitting: Hematology

## 2017-06-13 ENCOUNTER — Other Ambulatory Visit (HOSPITAL_BASED_OUTPATIENT_CLINIC_OR_DEPARTMENT_OTHER): Payer: BLUE CROSS/BLUE SHIELD

## 2017-06-13 ENCOUNTER — Ambulatory Visit (HOSPITAL_BASED_OUTPATIENT_CLINIC_OR_DEPARTMENT_OTHER): Payer: BLUE CROSS/BLUE SHIELD

## 2017-06-13 ENCOUNTER — Ambulatory Visit: Payer: BLUE CROSS/BLUE SHIELD

## 2017-06-13 ENCOUNTER — Ambulatory Visit (HOSPITAL_BASED_OUTPATIENT_CLINIC_OR_DEPARTMENT_OTHER): Payer: BLUE CROSS/BLUE SHIELD | Admitting: Hematology

## 2017-06-13 VITALS — BP 132/60 | HR 92 | Temp 98.6°F | Resp 18 | Ht 66.0 in | Wt 224.3 lb

## 2017-06-13 DIAGNOSIS — C187 Malignant neoplasm of sigmoid colon: Secondary | ICD-10-CM

## 2017-06-13 DIAGNOSIS — Z8674 Personal history of sudden cardiac arrest: Secondary | ICD-10-CM | POA: Diagnosis not present

## 2017-06-13 DIAGNOSIS — C7801 Secondary malignant neoplasm of right lung: Secondary | ICD-10-CM | POA: Diagnosis not present

## 2017-06-13 DIAGNOSIS — Z5112 Encounter for antineoplastic immunotherapy: Secondary | ICD-10-CM | POA: Diagnosis not present

## 2017-06-13 DIAGNOSIS — C7800 Secondary malignant neoplasm of unspecified lung: Principal | ICD-10-CM

## 2017-06-13 DIAGNOSIS — L270 Generalized skin eruption due to drugs and medicaments taken internally: Secondary | ICD-10-CM | POA: Diagnosis not present

## 2017-06-13 DIAGNOSIS — E669 Obesity, unspecified: Secondary | ICD-10-CM

## 2017-06-13 DIAGNOSIS — R079 Chest pain, unspecified: Secondary | ICD-10-CM | POA: Diagnosis not present

## 2017-06-13 DIAGNOSIS — F419 Anxiety disorder, unspecified: Secondary | ICD-10-CM

## 2017-06-13 DIAGNOSIS — C189 Malignant neoplasm of colon, unspecified: Secondary | ICD-10-CM

## 2017-06-13 DIAGNOSIS — C7802 Secondary malignant neoplasm of left lung: Secondary | ICD-10-CM

## 2017-06-13 DIAGNOSIS — R05 Cough: Secondary | ICD-10-CM | POA: Diagnosis not present

## 2017-06-13 DIAGNOSIS — Z95828 Presence of other vascular implants and grafts: Secondary | ICD-10-CM

## 2017-06-13 DIAGNOSIS — Z452 Encounter for adjustment and management of vascular access device: Secondary | ICD-10-CM

## 2017-06-13 DIAGNOSIS — R0600 Dyspnea, unspecified: Secondary | ICD-10-CM

## 2017-06-13 DIAGNOSIS — Z7189 Other specified counseling: Secondary | ICD-10-CM

## 2017-06-13 LAB — CBC WITH DIFFERENTIAL/PLATELET
BASO%: 0.6 % (ref 0.0–2.0)
Basophils Absolute: 0.1 10*3/uL (ref 0.0–0.1)
EOS ABS: 0.2 10*3/uL (ref 0.0–0.5)
EOS%: 2.7 % (ref 0.0–7.0)
HCT: 42.6 % (ref 38.4–49.9)
HGB: 14 g/dL (ref 13.0–17.1)
LYMPH%: 14.9 % (ref 14.0–49.0)
MCH: 28.4 pg (ref 27.2–33.4)
MCHC: 32.8 g/dL (ref 32.0–36.0)
MCV: 86.7 fL (ref 79.3–98.0)
MONO#: 0.9 10*3/uL (ref 0.1–0.9)
MONO%: 10.9 % (ref 0.0–14.0)
NEUT%: 70.9 % (ref 39.0–75.0)
NEUTROS ABS: 6.1 10*3/uL (ref 1.5–6.5)
PLATELETS: 260 10*3/uL (ref 140–400)
RBC: 4.92 10*6/uL (ref 4.20–5.82)
RDW: 14.1 % (ref 11.0–14.6)
WBC: 8.7 10*3/uL (ref 4.0–10.3)
lymph#: 1.3 10*3/uL (ref 0.9–3.3)

## 2017-06-13 LAB — COMPREHENSIVE METABOLIC PANEL
ALBUMIN: 3.5 g/dL (ref 3.5–5.0)
ALK PHOS: 94 U/L (ref 40–150)
ALT: 27 U/L (ref 0–55)
ANION GAP: 11 meq/L (ref 3–11)
AST: 24 U/L (ref 5–34)
BILIRUBIN TOTAL: 0.45 mg/dL (ref 0.20–1.20)
BUN: 8.3 mg/dL (ref 7.0–26.0)
CO2: 25 mEq/L (ref 22–29)
Calcium: 9.5 mg/dL (ref 8.4–10.4)
Chloride: 105 mEq/L (ref 98–109)
Creatinine: 0.8 mg/dL (ref 0.7–1.3)
Glucose: 94 mg/dl (ref 70–140)
Potassium: 3.9 mEq/L (ref 3.5–5.1)
Sodium: 140 mEq/L (ref 136–145)
TOTAL PROTEIN: 7.3 g/dL (ref 6.4–8.3)

## 2017-06-13 LAB — CEA (IN HOUSE-CHCC): CEA (CHCC-IN HOUSE): 27.88 ng/mL — AB (ref 0.00–5.00)

## 2017-06-13 LAB — MAGNESIUM: MAGNESIUM: 2.3 mg/dL (ref 1.5–2.5)

## 2017-06-13 MED ORDER — HEPARIN SOD (PORK) LOCK FLUSH 100 UNIT/ML IV SOLN
500.0000 [IU] | Freq: Once | INTRAVENOUS | Status: AC | PRN
  Administered 2017-06-13: 500 [IU]
  Filled 2017-06-13: qty 5

## 2017-06-13 MED ORDER — HYDROCODONE-ACETAMINOPHEN 5-325 MG PO TABS: 1.0000 | ORAL_TABLET | Freq: Four times a day (QID) | ORAL | 0 refills | Status: DC | PRN

## 2017-06-13 MED ORDER — ALTEPLASE 2 MG IJ SOLR
2.0000 mg | Freq: Once | INTRAMUSCULAR | Status: AC | PRN
  Administered 2017-06-13: 2 mg
  Filled 2017-06-13: qty 2

## 2017-06-13 MED ORDER — SODIUM CHLORIDE 0.9% FLUSH
10.0000 mL | INTRAVENOUS | Status: DC | PRN
  Administered 2017-06-13: 10 mL
  Filled 2017-06-13: qty 10

## 2017-06-13 MED ORDER — HYDROCODONE-HOMATROPINE 5-1.5 MG PO TABS: 1.0000 | ORAL_TABLET | Freq: Four times a day (QID) | ORAL | 0 refills | Status: DC | PRN

## 2017-06-13 MED ORDER — SODIUM CHLORIDE 0.9 % IV SOLN
Freq: Once | INTRAVENOUS | Status: AC
Start: 2017-06-13 — End: 2017-06-13
  Administered 2017-06-13: 15:00:00 via INTRAVENOUS

## 2017-06-13 MED ORDER — SODIUM CHLORIDE 0.9 % IV SOLN
6.0000 mg/kg | Freq: Once | INTRAVENOUS | Status: AC
  Administered 2017-06-13: 600 mg via INTRAVENOUS
  Filled 2017-06-13: qty 20

## 2017-06-13 MED ORDER — ALTEPLASE 2 MG IJ SOLR
INTRAMUSCULAR | Status: AC
  Filled 2017-06-13: qty 2

## 2017-06-13 NOTE — Progress Notes (Signed)
Per Dr Burr Medico it is ok to treat pt today with vectix and no lab results.

## 2017-06-13 NOTE — Progress Notes (Signed)
Per Dr. Burr Medico, [place] to give Vectibix today regardless of lab results.

## 2017-06-13 NOTE — Progress Notes (Signed)
DISCONTINUE ON PATHWAY REGIMEN - Colorectal     A cycle is every 21 days.:     Panitumumab      Oxaliplatin      Capecitabine   **Always confirm dose/schedule in your pharmacy ordering system**    REASON: Disease Progression PRIOR TREATMENT: COS80: CapeOx + Panitumumab 9 mg/kg q21 Days TREATMENT RESPONSE: Progressive Disease (PD)  START OFF PATHWAY REGIMEN - Colorectal   [id number]:5-Fluorouracil + Leucovorin + Bevacizumab q8 Week Cycle [facility]):   Administer every 8 weeks:     Leucovorin      5-Fluorouracil      Bevacizumab   **Always confirm dose/schedule in your pharmacy ordering system**    Patient Characteristics: Metastatic Colorectal, Third Line, KRAS/NRAS Wild-Type, BRAF Wild-Type/Unknown, Prior Anti-EGFR Therapy, MSS / pMMR Current evidence of distant metastases<= Yes AJCC T Category: T2 AJCC N Category: N1b AJCC M Category: M1a AJCC 8 Stage Grouping: IVA BRAF Mutation Status: Wild Type (no mutation) KRAS/NRAS Mutation Status: Wild Type (no mutation) Line of therapy: Third Line Would you be surprised if this patient died  in the next year<= I would NOT be surprised if this patient died in the next year Microsatellite/Mismatch Repair Status: MSS/pMMR Intent of Therapy: Non-Curative / Palliative Intent, Discussed with Patient

## 2017-06-13 NOTE — Patient Instructions (Signed)
[facility] Discharge Instructions for Patients Receiving Chemotherapy  Today you received the following chemotherapy agents Vectibix  To help prevent nausea and vomiting after your treatment, we encourage you to take your nausea medication as directed.   If you develop nausea and vomiting that is not controlled by your nausea medication, call the clinic.   BELOW ARE SYMPTOMS THAT SHOULD BE REPORTED IMMEDIATELY:  *FEVER GREATER THAN 100.5 F  *CHILLS WITH OR WITHOUT FEVER  NAUSEA AND VOMITING THAT IS NOT CONTROLLED WITH YOUR NAUSEA MEDICATION  *UNUSUAL SHORTNESS OF BREATH  *UNUSUAL BRUISING OR BLEEDING  TENDERNESS IN MOUTH AND THROAT WITH OR WITHOUT PRESENCE OF ULCERS  *URINARY PROBLEMS  *BOWEL PROBLEMS  UNUSUAL RASH Items with * indicate a potential emergency and should be followed up as soon as possible.  Feel free to call the clinic should you have any questions or concerns. The clinic phone number is [phone].  Please show the [place] at check-in to the Emergency Department and triage nurse.  Panitumumab Solution for Injection What is this medicine? PANITUMUMAB (pan i TOOM ue mab) is a monoclonal antibody. It is used to treat colorectal cancer. This medicine may be used for other purposes; ask your health care provider or pharmacist if you have questions. COMMON BRAND NAME(S): Vectibix What should I tell my health care provider before I take this medicine? They need to know if you have any of these conditions: -eye disease, vision problems -low levels of calcium, magnesium, or potassium in the blood -lung or breathing disease, like asthma -skin conditions or sensitivity -an unusual or allergic reaction to panitumumab, other medicines, foods, dyes, or preservatives -pregnant or trying to get pregnant -breast-feeding How should I use this medicine? This drug is given as an infusion into a vein. It is administered in a hospital or clinic  by a specially trained health care professional. Talk to your pediatrician regarding the use of this medicine in children. Special care may be needed. Overdosage: If you think you have taken too much of this medicine contact a poison control center or emergency room at once. NOTE: This medicine is only for you. Do not share this medicine with others. What if I miss a dose? It is important not to miss your dose. Call your doctor or health care professional if you are unable to keep an appointment. What may interact with this medicine? Do not take this medicine with any of the following medications: -bevacizumab This list may not describe all possible interactions. Give your health care provider a list of all the medicines, herbs, non-prescription drugs, or dietary supplements you use. Also tell them if you smoke, drink alcohol, or use illegal drugs. Some items may interact with your medicine. What should I watch for while using this medicine? Visit your doctor for checks on your progress. This drug may make you feel generally unwell. This is not uncommon, as chemotherapy can affect healthy cells as well as cancer cells. Report any side effects. Continue your course of treatment even though you feel ill unless your doctor tells you to stop. This medicine can make you more sensitive to the sun. Keep out of the sun while receiving this medicine and for 2 months after the last dose. If you cannot avoid being in the sun, wear protective clothing and use sunscreen. Do not use sun lamps or tanning beds/booths. In some cases, you may be given additional medicines to help with side effects. Follow all directions for their  use. Call your doctor or health care professional for advice if you get a fever, chills or sore throat, or other symptoms of a cold or flu. Do not treat yourself. This drug decreases your body's ability to fight infections. Try to avoid being around people who are sick. Avoid taking products  that contain aspirin, acetaminophen, ibuprofen, naproxen, or ketoprofen unless instructed by your doctor. These medicines may hide a fever. Do not become pregnant while taking this medicine and for 2 months after the last dose. Women should inform their doctor if they wish to become pregnant or think they might be pregnant. There is a potential for serious side effects to an unborn child. Talk to your health care professional or pharmacist for more information. Do not breast-feed an infant while taking this medicine or for 2 months after the last dose. What side effects may I notice from receiving this medicine? Side effects that you should report to your doctor or health care professional as soon as possible: -allergic reactions like skin rash, itching or hives, swelling of the face, lips, or tongue -breathing problems -changes in vision -eye pain -fast, irregular heartbeat -fever, chills -mouth sores -red spots on the skin -redness, blistering, peeling or loosening of the skin, including inside the mouth -signs and symptoms of kidney injury like trouble passing urine or change in the amount of urine -signs and symptoms of low blood pressure like dizziness; feeling faint or lightheaded, falls; unusually weak or tired -signs of low calcium like fast heartbeat, muscle cramps or muscle pain; pain, tingling, numbness in the hands or feet; seizures -signs and symptoms of low magnesium like muscle cramps, pain, or weakness; tremors; seizures; or fast, irregular heartbeat -signs and symptoms of low potassium like muscle cramps or muscle pain; chest pain; dizziness; feeling faint or lightheaded, falls; palpitations; breathing problems; or fast, irregular heartbeat -swelling of the ankles, feet, hands Side effects that usually do not require medical attention (report to your doctor or health care professional if they continue or are bothersome): -changes in skin like acne, cracks, skin  dryness -diarrhea -eyelash growth -headache -mouth sores -nail changes -nausea, vomiting This list may not describe all possible side effects. Call your doctor for medical advice about side effects. You may report side effects to FDA at 1-800-FDA-1088. Where should I keep my medicine? This drug is given in a hospital or clinic and will not be stored at home. NOTE: This sheet is a summary. It may not cover all possible information. If you have questions about this medicine, talk to your doctor, pharmacist, or health care provider.  2018 Elsevier/Gold Standard (2016-01-12 16:45:04)

## 2017-06-24 ENCOUNTER — Other Ambulatory Visit: Payer: Self-pay | Admitting: Hematology

## 2017-06-24 DIAGNOSIS — C7800 Secondary malignant neoplasm of unspecified lung: Principal | ICD-10-CM

## 2017-06-24 DIAGNOSIS — C189 Malignant neoplasm of colon, unspecified: Secondary | ICD-10-CM

## 2017-06-25 ENCOUNTER — Other Ambulatory Visit: Payer: Self-pay | Admitting: Radiology

## 2017-06-26 ENCOUNTER — Ambulatory Visit: Payer: BLUE CROSS/BLUE SHIELD | Admitting: Hematology

## 2017-06-26 ENCOUNTER — Encounter (HOSPITAL_COMMUNITY): Payer: Self-pay | Admitting: Interventional Radiology

## 2017-06-26 ENCOUNTER — Other Ambulatory Visit: Payer: BLUE CROSS/BLUE SHIELD

## 2017-06-26 ENCOUNTER — Other Ambulatory Visit: Payer: Self-pay | Admitting: Hematology

## 2017-06-26 ENCOUNTER — Encounter (HOSPITAL_COMMUNITY): Payer: Self-pay

## 2017-06-26 ENCOUNTER — Ambulatory Visit (HOSPITAL_COMMUNITY)
Admission: RE | Admit: 2017-06-26 | Discharge: 2017-06-26 | Disposition: A | Discharge status: Home / Self Care | Payer: BLUE CROSS/BLUE SHIELD | Source: Ambulatory Visit | Attending: Hematology | Admitting: Hematology

## 2017-06-26 ENCOUNTER — Telehealth: Payer: Self-pay | Admitting: *Deleted

## 2017-06-26 ENCOUNTER — Ambulatory Visit: Payer: BLUE CROSS/BLUE SHIELD

## 2017-06-26 DIAGNOSIS — C7800 Secondary malignant neoplasm of unspecified lung: Principal | ICD-10-CM

## 2017-06-26 DIAGNOSIS — Z7951 Long term (current) use of inhaled steroids: Secondary | ICD-10-CM | POA: Insufficient documentation

## 2017-06-26 DIAGNOSIS — C189 Malignant neoplasm of colon, unspecified: Secondary | ICD-10-CM

## 2017-06-26 DIAGNOSIS — Z79899 Other long term (current) drug therapy: Secondary | ICD-10-CM | POA: Insufficient documentation

## 2017-06-26 DIAGNOSIS — Z85038 Personal history of other malignant neoplasm of large intestine: Secondary | ICD-10-CM | POA: Diagnosis not present

## 2017-06-26 DIAGNOSIS — Z9221 Personal history of antineoplastic chemotherapy: Secondary | ICD-10-CM | POA: Insufficient documentation

## 2017-06-26 HISTORY — PX: IR REMOVAL TUN ACCESS W/ PORT W/O FL MOD SED: IMG2290

## 2017-06-26 HISTORY — PX: IR US GUIDE VASC ACCESS LEFT: IMG2389

## 2017-06-26 HISTORY — PX: IR IMAGING GUIDED PORT INSERTION: IMG5740

## 2017-06-26 LAB — CBC WITH DIFFERENTIAL/PLATELET
Basophils Absolute: 0 10*3/uL (ref 0.0–0.1)
Basophils Relative: 0 %
EOS ABS: 0.2 10*3/uL (ref 0.0–0.7)
EOS PCT: 2 %
HCT: 43 % (ref 39.0–52.0)
Hemoglobin: 14.9 g/dL (ref 13.0–17.0)
LYMPHS ABS: 1.2 10*3/uL (ref 0.7–4.0)
Lymphocytes Relative: 14 %
MCH: 29.8 pg (ref 26.0–34.0)
MCHC: 34.7 g/dL (ref 30.0–36.0)
MCV: 86 fL (ref 78.0–100.0)
MONOS PCT: 12 %
Monocytes Absolute: 1 10*3/uL (ref 0.1–1.0)
Neutro Abs: 6.3 10*3/uL (ref 1.7–7.7)
Neutrophils Relative %: 72 %
PLATELETS: 298 10*3/uL (ref 150–400)
RBC: 5 MIL/uL (ref 4.22–5.81)
RDW: 13.1 % (ref 11.5–15.5)
WBC: 8.7 10*3/uL (ref 4.0–10.5)

## 2017-06-26 LAB — PROTIME-INR
INR: 1
PROTHROMBIN TIME: 13.1 s (ref 11.4–15.2)

## 2017-06-26 MED ORDER — LIDOCAINE HCL 1 % IJ SOLN
INTRAMUSCULAR | Status: AC
  Filled 2017-06-26: qty 20

## 2017-06-26 MED ORDER — CEFAZOLIN SODIUM-DEXTROSE 2-4 GM/100ML-% IV SOLN
INTRAVENOUS | Status: AC
  Filled 2017-06-26: qty 100

## 2017-06-26 MED ORDER — FENTANYL CITRATE (PF) 100 MCG/2ML IJ SOLN
INTRAMUSCULAR | Status: AC
  Filled 2017-06-26: qty 2

## 2017-06-26 MED ORDER — MIDAZOLAM HCL 2 MG/2ML IJ SOLN
INTRAMUSCULAR | Status: AC | PRN
  Administered 2017-06-26 (×2): 1 mg via INTRAVENOUS
  Administered 2017-06-26: 2 mg via INTRAVENOUS

## 2017-06-26 MED ORDER — HEPARIN SOD (PORK) LOCK FLUSH 100 UNIT/ML IV SOLN
INTRAVENOUS | Status: AC
  Filled 2017-06-26: qty 5

## 2017-06-26 MED ORDER — LIDOCAINE-EPINEPHRINE (PF) 1 %-1:200000 IJ SOLN
INTRAMUSCULAR | Status: AC | PRN
  Administered 2017-06-26 (×2): 20 mL

## 2017-06-26 MED ORDER — LIDOCAINE HCL 1 % IJ SOLN
INTRAMUSCULAR | Status: AC | PRN
  Administered 2017-06-26: 10 mL

## 2017-06-26 MED ORDER — LIDOCAINE-EPINEPHRINE (PF) 2 %-1:200000 IJ SOLN
INTRAMUSCULAR | Status: AC
  Filled 2017-06-26: qty 20

## 2017-06-26 MED ORDER — FENTANYL CITRATE (PF) 100 MCG/2ML IJ SOLN
INTRAMUSCULAR | Status: AC
Start: 2017-06-26 — End: 2017-06-26
  Filled 2017-06-26: qty 2

## 2017-06-26 MED ORDER — SODIUM CHLORIDE 0.9 % IV SOLN
INTRAVENOUS | Status: DC
  Administered 2017-06-26: 09:00:00 via INTRAVENOUS

## 2017-06-26 MED ORDER — FENTANYL CITRATE (PF) 100 MCG/2ML IJ SOLN
INTRAMUSCULAR | Status: AC | PRN
  Administered 2017-06-26 (×4): 50 ug via INTRAVENOUS

## 2017-06-26 MED ORDER — MIDAZOLAM HCL 2 MG/2ML IJ SOLN
INTRAMUSCULAR | Status: AC
  Filled 2017-06-26: qty 4

## 2017-06-26 MED ORDER — CEFAZOLIN SODIUM-DEXTROSE 2-4 GM/100ML-% IV SOLN
2.0000 g | INTRAVENOUS | Status: AC
  Administered 2017-06-26: 2 g via INTRAVENOUS

## 2017-06-26 NOTE — Procedures (Signed)
Pre Procedure Dx: Metastatic colon cancer Post Procedural Dx: Same  Successful removal of right IJ approach port-a-cath. Successful placement of left IJ approach port-a-cath with tip at the superior caval atrial junction. The catheter is ready for immediate use.  Estimated Blood Loss: Minimal  Complications: None immediate.  Ronny Bacon, MD Pager #: [phone]

## 2017-06-26 NOTE — H&P (Signed)
Referring Physician(s): Feng,Yan  Supervising Physician: Sandi Mariscal  Patient Status:  [facility] OP  Chief Complaint:  "I'm getting a new port a cath"  Subjective: Patient familiar to IR service from prior attempted right lung nodule biopsy in 2016 as well as right chest wall Port-A-Cath revision on 04/15/17 secondary to poor aspiration/known fibrin sheath.  He has a history of metastatic colon cancer and continues to have pain at existing port site especially with coughing/arm movement and he presents again today for right chest wall Port-A-Cath removal and placement of new left chest wall Port-A-Cath. Past Medical History:  Diagnosis Date  . Anxiety    situational due to cancer diagnosis  . Cancer [facility]) 2017   colon-chemo 09/22/15 now surgery  . Hypercholesteremia   . Kidney calculi    Past Surgical History:  Procedure Laterality Date  . COLONOSCOPY  03/30/15  . IR CV LINE INJECTION  04/09/2017  . IR FLUORO GUIDE PORT INSERTION RIGHT  04/15/2017  . IR REMOVAL TUN ACCESS W/ PORT W/O FL MOD SED  04/15/2017  . IR US GUIDE VASC ACCESS RIGHT  04/15/2017  . LAPAROSCOPIC PARTIAL COLECTOMY N/A 11/02/2015   Procedure: LAPAROSCOPIC ASSISTED SIGMOID COLECTOMY;  Surgeon: Jackolyn Confer, MD;  Location: [facility] ORS;  Service: General;  Laterality: N/A;  . LEFT HEART CATH AND CORONARY ANGIOGRAPHY N/A 03/21/2017   Procedure: LEFT HEART CATH AND CORONARY ANGIOGRAPHY;  Surgeon: Dixie Dials, MD;  Location: [place] CV LAB;  Service: Cardiovascular;  Laterality: N/A;  . PORTACATH PLACEMENT N/A 05/08/2016   Procedure: INSERTION PORT-A-CATH;  Surgeon: Jackolyn Confer, MD;  Location: [facility];  Service: General;  Laterality: N/A;     Allergies: Oxaliplatin  Medications: Prior to Admission medications   Medication Sig Start Date End Date Taking? Authorizing Provider  clindamycin (CLINDAGEL) 1 % gel Apply 2 (two) times daily topically. 05/16/17   Truitt Merle, MD  doxycycline (VIBRA-TABS) 100 MG tablet Take 1  tablet (100 mg total) 2 (two) times daily by mouth. 05/16/17   Truitt Merle, MD  Fluticasone-Salmeterol (ADVAIR DISKUS) 250-50 MCG/DOSE AEPB Inhale 1 puff into the lungs 2 (two) times daily. Patient taking differently: Inhale 1 puff into the lungs daily.  01/27/17   Truitt Merle, MD  HYDROcodone-acetaminophen (NORCO/VICODIN) 5-325 MG tablet Take 1 tablet by mouth every 6 (six) hours as needed for moderate pain or severe pain. 06/13/17   Truitt Merle, MD  Hydrocodone-Homatropine 5-1.5 MG TABS Take 1 tablet by mouth every 6 (six) hours as needed. 06/13/17   Truitt Merle, MD  metoprolol tartrate (LOPRESSOR) 25 MG tablet Take 1 tablet (25 mg total) by mouth 2 (two) times daily. 03/21/17   Dixie Dials, MD  mometasone (ELOCON) 0.1 % cream Apply 1 application topically daily. 03/17/17   Truitt Merle, MD  prochlorperazine (COMPAZINE) 10 MG tablet Take 1 tablet (10 mg total) by mouth every 6 (six) hours as needed (NAUSEA). 02/14/17   Truitt Merle, MD  sildenafil (VIAGRA) 100 MG tablet Take 100 mg by mouth as needed for erectile dysfunction.  11/05/16   [provider]     Vital Signs: Blood pressure 150/98, heart rate 87, respirations 18, O2 sat 98% room air, temperature 98.9   Physical Exam awake, alert.  Chest clear to auscultation bilaterally.  Clean, intact right chest wall Port-A-Cath.  Heart with regular rate and rhythm.  Abdomen soft, protuberant, positive bowel sounds, nontender.  Extremities with full range of motion  Imaging: No results found.  Labs:  CBC: Recent Labs  05/01/17 0836 05/16/17 1246 05/30/17 1337 06/13/17 1207  WBC 7.5 7.3 9.0 8.7  HGB 13.1 13.1 13.6 14.0  HCT 39.6 39.7 41.5 42.6  PLT 234 227 239 260    COAGS: Recent Labs    03/20/17 1844 04/15/17 1004  INR 1.04 0.95    BMP: Recent Labs    03/20/17 1745 03/20/17 1757 03/21/17 0440 03/26/17 1058  05/01/17 0836 05/16/17 1246 05/30/17 1337 06/13/17 1207  NA 137 140 136 135   < > 142 140 140 140  K 3.3* 3.4* 4.4  4.0   < > 4.1 3.8 3.7 3.9  CL 104 102 104 103  --   --   --   --   --   CO2 22  --  22 20*   < > 27 27 27 25   GLUCOSE 116* 117* 176* 113*   < > 114 117 138 94  BUN 12 11 14 12    < > 11.1 10.5 10.5 8.3  CALCIUM 8.5*  --  8.7* 8.7*   < > 9.7 8.9 9.1 9.5  CREATININE 0.96 0.80 0.76 0.67   < > 0.9 0.8 0.9 0.8  GFRNONAA >60  --  >60 >60  --   --   --   --   --   GFRAA >60  --  >60 >60  --   --   --   --   --    < > = values in this interval not displayed.    LIVER FUNCTION TESTS: Recent Labs    05/01/17 0836 05/16/17 1246 05/30/17 1337 06/13/17 1207  BILITOT 0.52 0.40 0.41 0.45  AST 27 22 21 24   ALT 31 28 25 27   ALKPHOS 87 89 81 94  PROT 7.7 7.1 7.3 7.3  ALBUMIN 3.9 3.4* 3.4* 3.5    Assessment and Plan: Pt with history of metastatic colon cancer ; history of right chest wall Port-A-Cath revision on 04/15/17 secondary to poor aspiration/fibrin sheath; continues to have pain at existing port site especially with coughing/arm movement and he presents again today for right chest wall Port-A-Cath removal and placement of new left chest wall Port-A-Cath.Risks and benefits discussed with the patient including, but not limited to bleeding, infection, pneumothorax, or fibrin sheath development and need for additional procedures.All of the patient's questions were answered, patient is agreeable to proceed.Consent signed and in chart.Labs pend.     Electronically Signed: D. Rowe Robert, PA-C 06/26/2017, 8:37 AM   I spent a total of 20 minutes at the the patient's bedside AND on the patient's hospital floor or unit, greater than 50% of which was counseling/coordinating care for right chest wall Port-A-Cath removal, new left chest wall Port-A-Cath placement

## 2017-06-26 NOTE — Discharge Instructions (Addendum)
Moderate Conscious Sedation, Adult, Care After °These instructions provide you with information about caring for yourself after your procedure. Your health care provider may also give you more specific instructions. Your treatment has been planned according to current medical practices, but problems sometimes occur. Call your health care provider if you have any problems or questions after your procedure. °What can I expect after the procedure? °After your procedure, it is common: °· To feel sleepy for several hours. °· To feel clumsy and have poor balance for several hours. °· To have poor judgment for several hours. °· To vomit if you eat too soon. ° °Follow these instructions at home: °For at least 24 hours after the procedure: ° °· Do not: °? Participate in activities where you could fall or become injured. °? Drive. °? Use heavy machinery. °? Drink alcohol. °? Take sleeping pills or medicines that cause drowsiness. °? Make important decisions or sign legal documents. °? Take care of children on your own. °· Rest. °Eating and drinking °· Follow the diet recommended by your health care provider. °· If you vomit: °? Drink water, juice, or soup when you can drink without vomiting. °? Make sure you have little or no nausea before eating solid foods. °General instructions °· Have a responsible adult stay with you until you are awake and alert. °· Take over-the-counter and prescription medicines only as told by your health care provider. °· If you smoke, do not smoke without supervision. °· Keep all follow-up visits as told by your health care provider. This is important. °Contact a health care provider if: °· You keep feeling nauseous or you keep vomiting. °· You feel light-headed. °· You develop a rash. °· You have a fever. °Get help right away if: °· You have trouble breathing. °This information is not intended to replace advice given to you by your health care provider. Make sure you discuss any questions you have  with your health care provider. °Document Released: 04/14/2013 Document Revised: 11/27/2015 Document Reviewed: 10/14/2015 °Elsevier Interactive Patient Education © 2018 Elsevier Inc. °Implanted Port Home Guide °An implanted port is a type of central line that is placed under the skin. Central lines are used to provide IV access when treatment or nutrition needs to be given through a person’s veins. Implanted ports are used for long-term IV access. An implanted port may be placed because: °· You need IV medicine that would be irritating to the small veins in your hands or arms. °· You need long-term IV medicines, such as antibiotics. °· You need IV nutrition for a long period. °· You need frequent blood draws for lab tests. °· You need dialysis. ° °Implanted ports are usually placed in the chest area, but they can also be placed in the upper arm, the abdomen, or the leg. An implanted port has two main parts: °· Reservoir. The reservoir is round and will appear as a small, raised area under your skin. The reservoir is the part where a needle is inserted to give medicines or draw blood. °· Catheter. The catheter is a thin, flexible tube that extends from the reservoir. The catheter is placed into a large vein. Medicine that is inserted into the reservoir goes into the catheter and then into the vein. ° °How will I care for my incision site? °Do not get the incision site wet. Bathe or shower as directed by your health care provider. °How is my port accessed? °Special steps must be taken to access the port: °·   Before the port is accessed, a numbing cream can be placed on the skin. This helps numb the skin over the port site. °· Your health care provider uses a sterile technique to access the port. °? Your health care provider must put on a mask and sterile gloves. °? The skin over your port is cleaned carefully with an antiseptic and allowed to dry. °? The port is gently pinched between sterile gloves, and a needle is  inserted into the port. °· Only "non-coring" port needles should be used to access the port. Once the port is accessed, a blood return should be checked. This helps ensure that the port is in the vein and is not clogged. °· If your port needs to remain accessed for a constant infusion, a clear (transparent) bandage will be placed over the needle site. The bandage and needle will need to be changed every week, or as directed by your health care provider. °· Keep the bandage covering the needle clean and dry. Do not get it wet. Follow your health care provider’s instructions on how to take a shower or bath while the port is accessed. °· If your port does not need to stay accessed, no bandage is needed over the port. ° °What is flushing? °Flushing helps keep the port from getting clogged. Follow your health care provider’s instructions on how and when to flush the port. Ports are usually flushed with saline solution or a medicine called heparin. The need for flushing will depend on how the port is used. °· If the port is used for intermittent medicines or blood draws, the port will need to be flushed: °? After medicines have been given. °? After blood has been drawn. °? As part of routine maintenance. °· If a constant infusion is running, the port may not need to be flushed. ° °How long will my port stay implanted? °The port can stay in for as long as your health care provider thinks it is needed. When it is time for the port to come out, surgery will be done to remove it. The procedure is similar to the one performed when the port was put in. °When should I seek immediate medical care? °When you have an implanted port, you should seek immediate medical care if: °· You notice a bad smell coming from the incision site. °· You have swelling, redness, or drainage at the incision site. °· You have more swelling or pain at the port site or the surrounding area. °· You have a fever that is not controlled with  medicine. ° °This information is not intended to replace advice given to you by your health care provider. Make sure you discuss any questions you have with your health care provider. °Document Released: 06/24/2005 Document Revised: 11/30/2015 Document Reviewed: 03/01/2013 °Elsevier Interactive Patient Education © 2017 Elsevier Inc. ° °

## 2017-06-26 NOTE — Telephone Encounter (Signed)
Pt did not show for MD/RX today.  Checked with IR & pt had already gone home after port replacement.  Called pt & he states that he doesn't want treatment today & asked to be r/s for next week.  Informed Dr Burr Medico & schedule message sent.

## 2017-06-26 NOTE — Discharge Instructions (Signed)
Moderate Conscious Sedation, Adult, Care After °These instructions provide you with information about caring for yourself after your procedure. Your health care provider may also give you more specific instructions. Your treatment has been planned according to current medical practices, but problems sometimes occur. Call your health care provider if you have any problems or questions after your procedure. °What can I expect after the procedure? °After your procedure, it is common: °· To feel sleepy for several hours. °· To feel clumsy and have poor balance for several hours. °· To have poor judgment for several hours. °· To vomit if you eat too soon. ° °Follow these instructions at home: °For at least 24 hours after the procedure: ° °· Do not: °? Participate in activities where you could fall or become injured. °? Drive. °? Use heavy machinery. °? Drink alcohol. °? Take sleeping pills or medicines that cause drowsiness. °? Make important decisions or sign legal documents. °? Take care of children on your own. °· Rest. °Eating and drinking °· Follow the diet recommended by your health care provider. °· If you vomit: °? Drink water, juice, or soup when you can drink without vomiting. °? Make sure you have little or no nausea before eating solid foods. °General instructions °· Have a responsible adult stay with you until you are awake and alert. °· Take over-the-counter and prescription medicines only as told by your health care provider. °· If you smoke, do not smoke without supervision. °· Keep all follow-up visits as told by your health care provider. This is important. °Contact a health care provider if: °· You keep feeling nauseous or you keep vomiting. °· You feel light-headed. °· You develop a rash. °· You have a fever. °Get help right away if: °· You have trouble breathing. °This information is not intended to replace advice given to you by your health care provider. Make sure you discuss any questions you have  with your health care provider. °Document Released: 04/14/2013 Document Revised: 11/27/2015 Document Reviewed: 10/14/2015 °Elsevier Interactive Patient Education © 2018 Elsevier Inc. °Implanted Port Insertion, Care After °This sheet gives you information about how to care for yourself after your procedure. Your health care provider may also give you more specific instructions. If you have problems or questions, contact your health care provider. °What can I expect after the procedure? °After your procedure, it is common to have: °· Discomfort at the port insertion site. °· Bruising on the skin over the port. This should improve over 3-4 days. ° °Follow these instructions at home: °Port care °· After your port is placed, you will get a manufacturer's information card. The card has information about your port. Keep this card with you at all times. °· Take care of the port as told by your health care provider. Ask your health care provider if you or a family member can get training for taking care of the port at home. A home health care nurse may also take care of the port. °· Make sure to remember what type of port you have. °Incision care °· Follow instructions from your health care provider about how to take care of your port insertion site. Make sure you: °? Wash your hands with soap and water before you change your bandage (dressing). If soap and water are not available, use hand sanitizer. °? Change your dressing as told by your health care provider. °? Leave stitches (sutures), skin glue, or adhesive strips in place. These skin closures may need to stay   in place for 2 weeks or longer. If adhesive strip edges start to loosen and curl up, you may trim the loose edges. Do not remove adhesive strips completely unless your health care provider tells you to do that.  Check your port insertion site every day for signs of infection. Check for: ? More redness, swelling, or pain. ? More fluid or  blood. ? Warmth. ? Pus or a bad smell. General instructions  Do not take baths, swim, or use a hot tub until your health care provider approves.  Do not lift anything that is heavier than 10 lb (4.5 kg) for a week, or as told by your health care provider.  Ask your health care provider when it is okay to: ? Return to work or school. ? Resume usual physical activities or sports.  Do not drive for 24 hours if you were given a medicine to help you relax (sedative).  Take over-the-counter and prescription medicines only as told by your health care provider.  Wear a medical alert bracelet in case of an emergency. This will tell any health care providers that you have a port.  Keep all follow-up visits as told by your health care provider. This is important. Contact a health care provider if:  You cannot flush your port with saline as directed, or you cannot draw blood from the port.  You have a fever or chills.  You have more redness, swelling, or pain around your port insertion site.  You have more fluid or blood coming from your port insertion site.  Your port insertion site feels warm to the touch.  You have pus or a bad smell coming from the port insertion site. Get help right away if:  You have chest pain or shortness of breath.  You have bleeding from your port that you cannot control. Summary  Take care of the port as told by your health care provider.  Change your dressing as told by your health care provider.  Keep all follow-up visits as told by your health care provider. This information is not intended to replace advice given to you by your health care provider. Make sure you discuss any questions you have with your health care provider. Document Released: 04/14/2013 Document Revised: 05/15/2016 Document Reviewed: 05/15/2016 Elsevier Interactive Patient Education  2017 [place] Removal, Care After Refer to this sheet in the next few weeks.  These instructions provide you with information about caring for yourself after your procedure. Your health care provider may also give you more specific instructions. Your treatment has been planned according to current medical practices, but problems sometimes occur. Call your health care provider if you have any problems or questions after your procedure. What can I expect after the procedure? After the procedure, it is common to have:  Soreness or pain near your incision.  Some swelling or bruising near your incision.  Follow these instructions at home: Medicines  Take over-the-counter and prescription medicines only as told by your health care provider.  If you were prescribed an antibiotic medicine, take it as told by your health care provider. Do not stop taking the antibiotic even if you start to feel better. Bathing  Do not take baths, swim, or use a hot tub until your health care provider approves. Ask your health care provider if you can take showers. You may only be allowed to take sponge baths for bathing. Incision care  Follow instructions from your health care provider about how to  take care of your incision. Make sure you: ? Wash your hands with soap and water before you change your bandage (dressing). If soap and water are not available, use hand sanitizer. ? Change your dressing as told by your health care provider. ? Keep your dressing dry. ? Leave stitches (sutures), skin glue, or adhesive strips in place. These skin closures may need to stay in place for 2 weeks or longer. If adhesive strip edges start to loosen and curl up, you may trim the loose edges. Do not remove adhesive strips completely unless your health care provider tells you to do that.  Check your incision area every day for signs of infection. Check for: ? More redness, swelling, or pain. ? More fluid or blood. ? Warmth. ? Pus or a bad smell. Driving  If you received a sedative, do not drive for 24  hours after the procedure.  If you did not receive a sedative, ask your health care provider when it is safe to drive. Activity  Return to your normal activities as told by your health care provider. Ask your health care provider what activities are safe for you.  Until your health care provider says it is safe: ? Do not lift anything that is heavier than 10 lb (4.5 kg). ? Do not do activities that involve lifting your arms over your head. General instructions  Do not use any tobacco products, such as cigarettes, chewing tobacco, and e-cigarettes. Tobacco can delay healing. If you need help quitting, ask your health care provider.  Keep all follow-up visits as told by your health care provider. This is important. Contact a health care provider if:  You have more redness, swelling, or pain around your incision.  You have more fluid or blood coming from your incision.  Your incision feels warm to the touch.  You have pus or a bad smell coming from your incision.  You have a fever.  You have pain that is not relieved by your pain medicine. Get help right away if:  You have chest pain.  You have difficulty breathing. This information is not intended to replace advice given to you by your health care provider. Make sure you discuss any questions you have with your health care provider. Document Released: 06/05/2015 Document Revised: 11/30/2015 Document Reviewed: 03/29/2015 Elsevier Interactive Patient Education  [employer].

## 2017-06-30 ENCOUNTER — Emergency Department (HOSPITAL_COMMUNITY)
Admission: EM | Admit: 2017-06-30 | Discharge: 2017-06-30 | Disposition: A | Discharge status: Home / Self Care | Payer: BLUE CROSS/BLUE SHIELD | Attending: Emergency Medicine | Admitting: Emergency Medicine

## 2017-06-30 ENCOUNTER — Emergency Department (HOSPITAL_COMMUNITY): Payer: BLUE CROSS/BLUE SHIELD

## 2017-06-30 ENCOUNTER — Other Ambulatory Visit: Payer: Self-pay

## 2017-06-30 ENCOUNTER — Encounter (HOSPITAL_COMMUNITY): Payer: Self-pay

## 2017-06-30 DIAGNOSIS — J189 Pneumonia, unspecified organism: Secondary | ICD-10-CM | POA: Insufficient documentation

## 2017-06-30 DIAGNOSIS — Z85118 Personal history of other malignant neoplasm of bronchus and lung: Secondary | ICD-10-CM | POA: Insufficient documentation

## 2017-06-30 DIAGNOSIS — R0602 Shortness of breath: Secondary | ICD-10-CM | POA: Insufficient documentation

## 2017-06-30 DIAGNOSIS — R0781 Pleurodynia: Secondary | ICD-10-CM

## 2017-06-30 DIAGNOSIS — R079 Chest pain, unspecified: Secondary | ICD-10-CM | POA: Diagnosis present

## 2017-06-30 DIAGNOSIS — Z85038 Personal history of other malignant neoplasm of large intestine: Secondary | ICD-10-CM | POA: Diagnosis not present

## 2017-06-30 LAB — CBC WITH DIFFERENTIAL/PLATELET
BASOS PCT: 0 %
Basophils Absolute: 0 10*3/uL (ref 0.0–0.1)
Eosinophils Absolute: 0.1 10*3/uL (ref 0.0–0.7)
Eosinophils Relative: 1 %
HEMATOCRIT: 44.6 % (ref 39.0–52.0)
Hemoglobin: 15 g/dL (ref 13.0–17.0)
Lymphocytes Relative: 11 %
Lymphs Abs: 1.3 10*3/uL (ref 0.7–4.0)
MCH: 29.2 pg (ref 26.0–34.0)
MCHC: 33.6 g/dL (ref 30.0–36.0)
MCV: 86.9 fL (ref 78.0–100.0)
MONO ABS: 1.2 10*3/uL — AB (ref 0.1–1.0)
MONOS PCT: 10 %
NEUTROS ABS: 9.2 10*3/uL — AB (ref 1.7–7.7)
Neutrophils Relative %: 78 %
Platelets: 279 10*3/uL (ref 150–400)
RBC: 5.13 MIL/uL (ref 4.22–5.81)
RDW: 13.3 % (ref 11.5–15.5)
WBC: 11.8 10*3/uL — ABNORMAL HIGH (ref 4.0–10.5)

## 2017-06-30 LAB — I-STAT TROPONIN, ED: TROPONIN I, POC: 0.02 ng/mL (ref 0.00–0.08)

## 2017-06-30 LAB — I-STAT CG4 LACTIC ACID, ED
LACTIC ACID, VENOUS: 1.31 mmol/L (ref 0.5–1.9)
LACTIC ACID, VENOUS: 1.13 mmol/L (ref 0.5–1.9)

## 2017-06-30 LAB — COMPREHENSIVE METABOLIC PANEL
ALBUMIN: 3.9 g/dL (ref 3.5–5.0)
ALT: 24 U/L (ref 17–63)
ANION GAP: 9 (ref 5–15)
AST: 23 U/L (ref 15–41)
Alkaline Phosphatase: 97 U/L (ref 38–126)
BILIRUBIN TOTAL: 0.8 mg/dL (ref 0.3–1.2)
BUN: 10 mg/dL (ref 6–20)
CALCIUM: 9.1 mg/dL (ref 8.9–10.3)
CO2: 27 mmol/L (ref 22–32)
Chloride: 101 mmol/L (ref 101–111)
Creatinine, Ser: 0.72 mg/dL (ref 0.61–1.24)
GLUCOSE: 107 mg/dL — AB (ref 65–99)
POTASSIUM: 3.9 mmol/L (ref 3.5–5.1)
Sodium: 137 mmol/L (ref 135–145)
TOTAL PROTEIN: 7.7 g/dL (ref 6.5–8.1)

## 2017-06-30 LAB — BRAIN NATRIURETIC PEPTIDE: B NATRIURETIC PEPTIDE 5: 52.1 pg/mL (ref 0.0–100.0)

## 2017-06-30 MED ORDER — LEVOFLOXACIN 750 MG PO TABS: 750.0000 mg | ORAL_TABLET | Freq: Every day | ORAL | 0 refills | Status: DC

## 2017-06-30 MED ORDER — SODIUM CHLORIDE 0.9 % IV BOLUS (SEPSIS)
1000.0000 mL | Freq: Once | INTRAVENOUS | Status: AC
  Administered 2017-06-30: 1000 mL via INTRAVENOUS

## 2017-06-30 MED ORDER — IOPAMIDOL (ISOVUE-370) INJECTION 76%
INTRAVENOUS | Status: AC
  Filled 2017-06-30: qty 100

## 2017-06-30 MED ORDER — OXYCODONE HCL 5 MG PO TABS: 5.0000 mg | ORAL_TABLET | ORAL | 0 refills | Status: DC | PRN

## 2017-06-30 MED ORDER — IOPAMIDOL (ISOVUE-370) INJECTION 76%
100.0000 mL | Freq: Once | INTRAVENOUS | Status: AC | PRN
  Administered 2017-06-30: 100 mL via INTRAVENOUS

## 2017-06-30 NOTE — ED Notes (Signed)
Bed: WA25 Expected date:  Expected time:  Means of arrival:  Comments: Triage 2 

## 2017-06-30 NOTE — ED Triage Notes (Signed)
He states he has been short of breath for about two weeks. He is very frightened in demeanor. His wife is with him. She tells [place] that during a chemo. Administration in Sep't. This year pt. Had cardiac arrest. He is in no distress.

## 2017-06-30 NOTE — ED Provider Notes (Signed)
[facility] DEPT Provider Note   CSN: [id number] Arrival date & time: 06/30/17  0941     History   Chief Complaint Chief Complaint  Patient presents with  . Shortness of Breath  . Cancer    HPI Bill Wright is a 56 y.o. male.  HPI   2 weeks ago has had dyspnea, had port moved from right to left side last Thursday.  Now having chest pains, on right side and on left, feels like intense pain with coughing, sharp pain, deep breath aggravates it.  Has had cough for 2 years since radiation, so has frequent cough, and now is having severe pain with coughing.  Started taking medication for cough which helped some.  Dyspnea has been increasing slowly, can't talk or breathe.  Family members have asthma so tried their inhaler.  Reports had some relief from inhaler, this AM felt like needed to take it every 2 minutes.  No hx of asthma or COPD.    25mos had port, a few months later started having CP on right side, intense with coughing. Intensity of pain on right side was where port was.  Had that for a few weeks.  Last week moved the port, now that pain has been relieved but now having sharp pain that has returned. This AM having left sided pain with coughing. Is intense.  Sharp pain pinpoint right side, sharp pain also on left side, with coughing only, pressure on chest while.  Dyspnea not worse with laying down flat.  Usually dyspnea improves when laying down.    Past Medical History:  Diagnosis Date  . Anxiety    situational due to cancer diagnosis  . Cancer [facility]) 2017   colon-chemo 09/22/15 now surgery  . Hypercholesteremia   . Kidney calculi     Patient Active Problem List   Diagnosis Date Noted  . Cardiac arrest ([place]) 03/20/2017  . Goals of care, counseling/discussion 07/11/2016  . Port catheter in place 05/23/2016  . Genetic testing 12/06/2015  . Metastasis to lung ([place]) 12/01/2015  . Epigastric abdominal pain 05/19/2015  . Encounter for  chemotherapy management 04/20/2015  . Colon cancer metastasized to lung [facility]) s/p laparoscopic assisted sigmoid colectomy 11/02/15 04/07/2015    Past Surgical History:  Procedure Laterality Date  . COLONOSCOPY  03/30/15  . IR CV LINE INJECTION  04/09/2017  . IR FLUORO GUIDE PORT INSERTION LEFT  06/26/2017  . IR FLUORO GUIDE PORT INSERTION RIGHT  04/15/2017  . IR REMOVAL TUN ACCESS W/ PORT W/O FL MOD SED  04/15/2017  . IR REMOVAL TUN ACCESS W/ PORT W/O FL MOD SED  06/26/2017  . IR US GUIDE VASC ACCESS LEFT  06/26/2017  . IR US GUIDE VASC ACCESS RIGHT  04/15/2017  . LAPAROSCOPIC PARTIAL COLECTOMY N/A 11/02/2015   Procedure: LAPAROSCOPIC ASSISTED SIGMOID COLECTOMY;  Surgeon: Jackolyn Confer, MD;  Location: [facility] ORS;  Service: General;  Laterality: N/A;  . LEFT HEART CATH AND CORONARY ANGIOGRAPHY N/A 03/21/2017   Procedure: LEFT HEART CATH AND CORONARY ANGIOGRAPHY;  Surgeon: Dixie Dials, MD;  Location: [facility] CV LAB;  Service: Cardiovascular;  Laterality: N/A;  . PORTACATH PLACEMENT N/A 05/08/2016   Procedure: INSERTION PORT-A-CATH;  Surgeon: Jackolyn Confer, MD;  Location: [facility] ORS;  Service: General;  Laterality: N/A;       Home Medications    Prior to Admission medications   Medication Sig Start Date End Date Taking? Authorizing Provider  albuterol (VENTOLIN HFA) 108 (90 Base) MCG/ACT inhaler Inhale  1 puff into the lungs daily as needed for wheezing or shortness of breath. 10 PUFFS DAILY FOR WHEEZING AND SHORTNESS OF BREATH   Yes [provider]  clindamycin (CLINDAGEL) 1 % gel Apply 2 (two) times daily topically. 05/16/17  Yes Truitt Merle, MD  diltiazem (CARDIZEM) 30 MG tablet Take 30 mg by mouth 3 (three) times daily.   Yes Dixie Dials, MD  doxycycline (VIBRA-TABS) 100 MG tablet Take 1 tablet (100 mg total) 2 (two) times daily by mouth. 05/16/17  Yes Truitt Merle, MD  Fluticasone-Salmeterol (ADVAIR DISKUS) 250-50 MCG/DOSE AEPB Inhale 1 puff into the lungs 2 (two) times daily. Patient  taking differently: Inhale 1 puff into the lungs daily.  01/27/17  Yes Truitt Merle, MD  HYDROcodone-acetaminophen (NORCO/VICODIN) 5-325 MG tablet Take 1 tablet by mouth every 6 (six) hours as needed for moderate pain or severe pain. 06/13/17  Yes Truitt Merle, MD  Hydrocodone-Homatropine 5-1.5 MG TABS Take 1 tablet by mouth every 6 (six) hours as needed. 06/13/17  Yes Truitt Merle, MD  metoprolol tartrate (LOPRESSOR) 25 MG tablet Take 1 tablet (25 mg total) by mouth 2 (two) times daily. 03/21/17  Yes Dixie Dials, MD  levofloxacin (LEVAQUIN) 750 MG tablet Take 1 tablet (750 mg total) by mouth daily. 06/30/17   Gareth Morgan, MD  mometasone (ELOCON) 0.1 % cream Apply 1 application topically daily. Patient not taking: Reported on 06/30/2017 03/17/17   Truitt Merle, MD  oxyCODONE (ROXICODONE) 5 MG immediate release tablet Take 1 tablet (5 mg total) by mouth every 4 (four) hours as needed for severe pain. 06/30/17   Gareth Morgan, MD  prochlorperazine (COMPAZINE) 10 MG tablet Take 1 tablet (10 mg total) by mouth every 6 (six) hours as needed (NAUSEA). Patient not taking: Reported on 06/30/2017 02/14/17   Truitt Merle, MD    Family History Family History  Problem Relation Age of Onset  . Breast cancer Mother 35       +rad and lymph node  . Diabetes Maternal Grandmother        leading to blindness  . Obesity Maternal Aunt   . Stroke Maternal Uncle 31    Social History Social History   Tobacco Use  . Smoking status: Never Smoker  . Smokeless tobacco: Never Used  Substance Use Topics  . Alcohol use: No  . Drug use: No     Allergies   Oxaliplatin   Review of Systems Review of Systems  Constitutional: Negative for fever.  HENT: Negative for sore throat.   Eyes: Negative for visual disturbance.  Respiratory: Positive for cough (chronic), shortness of breath and wheezing.   Cardiovascular: Positive for chest pain.  Gastrointestinal: Positive for constipation and nausea (slight). Negative for  abdominal pain and vomiting.  Genitourinary: Negative for difficulty urinating.  Musculoskeletal: Negative for back pain and neck stiffness.  Skin: Negative for rash.  Neurological: Negative for syncope and headaches.     Physical Exam Updated Vital Signs BP 127/77   Pulse 96   Temp 98.4 F (36.9 C) (Oral)   Resp (!) 22   SpO2 98%   Physical Exam  Constitutional: He is oriented to person, place, and time. He appears well-developed and well-nourished. No distress.  HENT:  Head: Normocephalic and atraumatic.  Eyes: Conjunctivae and EOM are normal.  Neck: Normal range of motion.  Cardiovascular: Normal rate, regular rhythm, normal heart sounds and intact distal pulses. Exam reveals no gallop and no friction rub.  No murmur heard. Pulmonary/Chest: Effort normal. No  respiratory distress. He has wheezes (occasional left lower lobe). He has no rales.  Abdominal: Soft. He exhibits no distension. There is no tenderness. There is no guarding.  Musculoskeletal: He exhibits no edema.  Neurological: He is alert and oriented to person, place, and time.  Skin: Skin is warm and dry. He is not diaphoretic.  Nursing note and vitals reviewed.    ED Treatments / Results  Labs (all labs ordered are listed, but only abnormal results are displayed) Labs Reviewed  CBC WITH DIFFERENTIAL/PLATELET - Abnormal; Notable for the following components:      Result Value   WBC 11.8 (*)    Neutro Abs 9.2 (*)    Monocytes Absolute 1.2 (*)    All other components within normal limits  COMPREHENSIVE METABOLIC PANEL - Abnormal; Notable for the following components:   Glucose, Bld 107 (*)    All other components within normal limits  CULTURE, BLOOD (ROUTINE X 2)  CULTURE, BLOOD (ROUTINE X 2)  BRAIN NATRIURETIC PEPTIDE  I-STAT TROPONIN, ED  I-STAT CG4 LACTIC ACID, ED  I-STAT CG4 LACTIC ACID, ED    EKG  EKG Interpretation None       Radiology Dg Chest 2 View  Result Date: 06/30/2017 CLINICAL  DATA:  Shortness of breath for 1 month, had cardiac arrest due to chemotherapy, lung cancer, colon cancer EXAM: CHEST  2 VIEW COMPARISON:  03/20/2017 FINDINGS: LEFT jugular Port-A-Cath with tip projecting over cavoatrial junction. Enlargement of cardiac silhouette. Stable mediastinal contours. Diffuse infiltrate throughout LEFT lung. Questionable nodular density at LEFT base 19 mm diameter. More confluent opacity at LEFT apex. Subsegmental atelectasis at RIGHT base and minor fissure. No pneumothorax or definite acute osseous findings. IMPRESSION: Diffuse LEFT lung infiltrate question pneumonia. Probable 19 mm metastasis at LEFT lower lobe. By interval CT patient has BILATERAL pulmonary metastases which are suboptimally visualized on current radiographic exam due to infiltrates particularly in LEFT lung. Electronically Signed   By: Lavonia Dana M.D.   On: 06/30/2017 11:42   Ct Angio Chest Pe W And/or Wo Contrast  Result Date: 06/30/2017 CLINICAL DATA:  Shortness of breath. EXAM: CT ANGIOGRAPHY CHEST WITH CONTRAST TECHNIQUE: Multidetector CT imaging of the chest was performed using the standard protocol during bolus administration of intravenous contrast. Multiplanar CT image reconstructions and MIPs were obtained to evaluate the vascular anatomy. CONTRAST:  163mL ISOVUE-370 IOPAMIDOL (ISOVUE-370) INJECTION 76% COMPARISON:  CT scan of June 11, 2017. FINDINGS: Cardiovascular: There is no definite evidence of pulmonary embolus. No pericardial effusion is noted. There is no evidence of thoracic aortic dissection or aneurysm. Mediastinum/Nodes: Thyroid gland and esophagus are unremarkable. Stable 14 mm adenopathy is noted in aortopulmonary window. 2.1 cm subcarinal adenopathy is noted which is increased compared to prior exam. Left hilar mass or adenopathy is also noted. Lungs/Pleura: No pneumothorax is noted. No significant pleural effusion is noted. Increased left upper lobe opacity is noted concerning for  worsening pneumonia or postobstructive atelectasis secondary to malignancy. 2.1 cm right lower lobe nodule is noted which is increased in size compared to prior exam. Stable 2.7 cm right infrahilar mass is noted. Stable 2 cm left lower lobe nodule or metastatic lesion is noted. Upper Abdomen: The right hepatic metastatic lesion noted on prior exam is not well visualized on this study. Musculoskeletal: No chest wall abnormality. No acute or significant osseous findings. Review of the MIP images confirms the above findings. IMPRESSION: No definite evidence of pulmonary embolus. Stable aortopulmonary window adenopathy is noted. Significantly increased subcarinal  adenopathy is noted concerning for worsening metastatic disease. Continued presence of left hilar mass or adenopathy is noted, which appears to be resulting in increased postobstructive atelectasis or pneumonia. Stable pulmonary nodules or metastatic lesions are noted in both lower lobes. Right hepatic metastatic lesion noted on prior exam is not well visualized currently. Electronically Signed   By: Marijo Conception, M.D.   On: 06/30/2017 13:54    Procedures Procedures (including critical care time)  Medications Ordered in ED Medications  sodium chloride 0.9 % bolus 1,000 mL (0 mLs Intravenous Stopped 06/30/17 1554)  iopamidol (ISOVUE-370) 76 % injection 100 mL (100 mLs Intravenous Contrast Given 06/30/17 1326)     Initial Impression / Assessment and Plan / ED Course  I have reviewed the triage vital signs and the nursing notes.  Pertinent labs & imaging results that were available during my care of the patient were reviewed by me and considered in my medical decision making (see chart for details).     56yo male with history of colon cancer with pulmonary metastases, prior cardiac arrest after adverse reaction to oxaplatin, who presents with concern for pleuritic chest pain and dyspnea. Pt tachycardic on arrival. CT PE study done showing no  PE, but does show worsening adenopathy with postobstructive atelectasis vs pneumonia.  Trop negative, BNP WNL.  Lactic acid negative x2, 11.8WBC. Vital signs improved in the ED. No hypoxia, no significant respiratory distress.  Discussed it is reasonable to admit but feel given unclear if atelectasis vs pneumonia, normal vital signs at this time, that outpatient treatment with strict return precautions is reasonable.  Given rx for levaquin.  Given oxycodone given significant pain, which may be related to pneumonia or worsening metastatic disease. Discussed risks of oxycodone. Reviewed in drug database, last rx from last month.  Patient discharged in stable condition with understanding of reasons to return.   Final Clinical Impressions(s) / ED Diagnoses   Final diagnoses:  Shortness of breath  Pleuritic chest pain  Postobstructive pneumonia    ED Discharge Orders        Ordered    levofloxacin (LEVAQUIN) 750 MG tablet  Daily     06/30/17 1452    oxyCODONE (ROXICODONE) 5 MG immediate release tablet  Every 4 hours PRN     06/30/17 1453       Gareth Morgan, MD 06/30/17 2314

## 2017-07-02 ENCOUNTER — Telehealth: Payer: Self-pay | Admitting: Nurse Practitioner

## 2017-07-02 NOTE — Telephone Encounter (Signed)
Per 12/20 sch message - Please r/s missed appts with Dr Burr Medico from today to 07/04/17 with infusion-avastin & then 5Fu on Saturday or another day next week. - Per Lacie okay to no have f/u on 12/27 due to NP and MD unavailable - f/u on next scheduled appt on 1/03/019

## 2017-07-03 ENCOUNTER — Ambulatory Visit: Payer: BLUE CROSS/BLUE SHIELD

## 2017-07-03 ENCOUNTER — Other Ambulatory Visit: Payer: BLUE CROSS/BLUE SHIELD

## 2017-07-04 ENCOUNTER — Telehealth: Payer: Self-pay | Admitting: Hematology

## 2017-07-04 ENCOUNTER — Ambulatory Visit (HOSPITAL_BASED_OUTPATIENT_CLINIC_OR_DEPARTMENT_OTHER): Payer: BLUE CROSS/BLUE SHIELD

## 2017-07-04 ENCOUNTER — Other Ambulatory Visit: Payer: Self-pay | Admitting: Hematology

## 2017-07-04 VITALS — BP 110/84 | HR 124 | Temp 98.1°F | Resp 23

## 2017-07-04 DIAGNOSIS — Z5111 Encounter for antineoplastic chemotherapy: Secondary | ICD-10-CM | POA: Diagnosis not present

## 2017-07-04 DIAGNOSIS — C187 Malignant neoplasm of sigmoid colon: Secondary | ICD-10-CM

## 2017-07-04 DIAGNOSIS — C7801 Secondary malignant neoplasm of right lung: Secondary | ICD-10-CM | POA: Diagnosis not present

## 2017-07-04 DIAGNOSIS — C7800 Secondary malignant neoplasm of unspecified lung: Principal | ICD-10-CM

## 2017-07-04 DIAGNOSIS — C7802 Secondary malignant neoplasm of left lung: Secondary | ICD-10-CM

## 2017-07-04 DIAGNOSIS — Z5112 Encounter for antineoplastic immunotherapy: Secondary | ICD-10-CM

## 2017-07-04 DIAGNOSIS — C189 Malignant neoplasm of colon, unspecified: Secondary | ICD-10-CM

## 2017-07-04 LAB — COMPREHENSIVE METABOLIC PANEL
ALBUMIN: 3.5 g/dL (ref 3.5–5.0)
ALK PHOS: 99 U/L (ref 40–150)
ALT: 25 U/L (ref 0–55)
AST: 20 U/L (ref 5–34)
Anion Gap: 10 mEq/L (ref 3–11)
BUN: 8 mg/dL (ref 7.0–26.0)
CO2: 27 meq/L (ref 22–29)
Calcium: 9.6 mg/dL (ref 8.4–10.4)
Chloride: 101 mEq/L (ref 98–109)
Creatinine: 0.9 mg/dL (ref 0.7–1.3)
GLUCOSE: 136 mg/dL (ref 70–140)
POTASSIUM: 3.9 meq/L (ref 3.5–5.1)
SODIUM: 138 meq/L (ref 136–145)
TOTAL PROTEIN: 7.4 g/dL (ref 6.4–8.3)
Total Bilirubin: 0.54 mg/dL (ref 0.20–1.20)

## 2017-07-04 LAB — CBC WITH DIFFERENTIAL/PLATELET
BASO%: 0.4 % (ref 0.0–2.0)
Basophils Absolute: 0 10*3/uL (ref 0.0–0.1)
EOS%: 1.7 % (ref 0.0–7.0)
Eosinophils Absolute: 0.1 10*3/uL (ref 0.0–0.5)
HCT: 42.9 % (ref 38.4–49.9)
HEMOGLOBIN: 14.3 g/dL (ref 13.0–17.1)
LYMPH%: 12.8 % — AB (ref 14.0–49.0)
MCH: 29.1 pg (ref 27.2–33.4)
MCHC: 33.3 g/dL (ref 32.0–36.0)
MCV: 87.4 fL (ref 79.3–98.0)
MONO#: 0.6 10*3/uL (ref 0.1–0.9)
MONO%: 8 % (ref 0.0–14.0)
NEUT%: 77.1 % — ABNORMAL HIGH (ref 39.0–75.0)
NEUTROS ABS: 6 10*3/uL (ref 1.5–6.5)
Platelets: 274 10*3/uL (ref 140–400)
RBC: 4.91 10*6/uL (ref 4.20–5.82)
RDW: 13.2 % (ref 11.0–14.6)
WBC: 7.7 10*3/uL (ref 4.0–10.3)
lymph#: 1 10*3/uL (ref 0.9–3.3)

## 2017-07-04 LAB — MAGNESIUM: Magnesium: 2.3 mg/dl (ref 1.5–2.5)

## 2017-07-04 LAB — UA PROTEIN, DIPSTICK - CHCC: Protein, ur: NEGATIVE mg/dL

## 2017-07-04 MED ORDER — SODIUM CHLORIDE 0.9 % IV SOLN
Freq: Once | INTRAVENOUS | Status: AC
  Administered 2017-07-04: 12:00:00 via INTRAVENOUS

## 2017-07-04 MED ORDER — PROCHLORPERAZINE MALEATE 10 MG PO TABS
ORAL_TABLET | ORAL | Status: AC
  Filled 2017-07-04: qty 1

## 2017-07-04 MED ORDER — HEPARIN SOD (PORK) LOCK FLUSH 100 UNIT/ML IV SOLN
500.0000 [IU] | Freq: Once | INTRAVENOUS | Status: AC | PRN
Start: 2017-07-04 — End: 2017-07-04
  Administered 2017-07-04: 500 [IU]
  Filled 2017-07-04: qty 5

## 2017-07-04 MED ORDER — LEUCOVORIN CALCIUM INJECTION 350 MG
500.0000 mg/m2 | Freq: Once | INTRAVENOUS | Status: AC
  Administered 2017-07-04: 1090 mg via INTRAVENOUS
  Filled 2017-07-04: qty 50

## 2017-07-04 MED ORDER — LORAZEPAM 0.5 MG PO TABS: 0.5000 mg | ORAL_TABLET | Freq: Four times a day (QID) | ORAL | 0 refills | Status: AC | PRN

## 2017-07-04 MED ORDER — ONDANSETRON HCL 8 MG PO TABS: 8.0000 mg | ORAL_TABLET | Freq: Two times a day (BID) | ORAL | 1 refills | Status: AC | PRN

## 2017-07-04 MED ORDER — PROCHLORPERAZINE MALEATE 10 MG PO TABS
10.0000 mg | ORAL_TABLET | Freq: Once | ORAL | Status: AC
  Administered 2017-07-04: 10 mg via ORAL

## 2017-07-04 MED ORDER — FLUOROURACIL CHEMO INJECTION 2.5 GM/50ML
600.0000 mg/m2 | Freq: Once | INTRAVENOUS | Status: AC
  Administered 2017-07-04: 1300 mg via INTRAVENOUS
  Filled 2017-07-04: qty 26

## 2017-07-04 MED ORDER — PROCHLORPERAZINE MALEATE 10 MG PO TABS
10.0000 mg | ORAL_TABLET | Freq: Four times a day (QID) | ORAL | 1 refills | Status: AC | PRN
Start: 2017-07-04 — End: ?

## 2017-07-04 MED ORDER — SODIUM CHLORIDE 0.9 % IV SOLN
5.0000 mg/kg | Freq: Once | INTRAVENOUS | Status: AC
  Administered 2017-07-04: 500 mg via INTRAVENOUS
  Filled 2017-07-04: qty 16

## 2017-07-04 MED ORDER — SODIUM CHLORIDE 0.9% FLUSH
10.0000 mL | INTRAVENOUS | Status: DC | PRN
  Administered 2017-07-04: 10 mL
  Filled 2017-07-04: qty 10

## 2017-07-04 NOTE — Telephone Encounter (Signed)
Scheduled appt per 12/28 sch msg - left message for patient regarding appts that were added to today.

## 2017-07-04 NOTE — Patient Instructions (Addendum)
[facility] Discharge Instructions for Patients Receiving Chemotherapy  Today you received the following chemotherapy agents 5FU, Leucovorin Avastin  To help prevent nausea and vomiting after your treatment, we encourage you to take your nausea medication as prescribed.   If you develop nausea and vomiting that is not controlled by your nausea medication, call the clinic.   BELOW ARE SYMPTOMS THAT SHOULD BE REPORTED IMMEDIATELY:  *FEVER GREATER THAN 100.5 F  *CHILLS WITH OR WITHOUT FEVER  NAUSEA AND VOMITING THAT IS NOT CONTROLLED WITH YOUR NAUSEA MEDICATION  *UNUSUAL SHORTNESS OF BREATH  *UNUSUAL BRUISING OR BLEEDING  TENDERNESS IN MOUTH AND THROAT WITH OR WITHOUT PRESENCE OF ULCERS  *URINARY PROBLEMS  *BOWEL PROBLEMS  UNUSUAL RASH Items with * indicate a potential emergency and should be followed up as soon as possible.  Feel free to call the clinic should you have any questions or concerns. The clinic phone number is [phone].  Please show the [facility] at check-in to the Emergency Department and triage nurse.  Leucovorin injection What is this medicine? LEUCOVORIN (loo koe VOR in) is used to prevent or treat the harmful effects of some medicines. This medicine is used to treat anemia caused by a low amount of folic acid in the body. It is also used with 5-fluorouracil (5-FU) to treat colon cancer. This medicine may be used for other purposes; ask your health care provider or pharmacist if you have questions. What should I tell my health care provider before I take this medicine? They need to know if you have any of these conditions: -anemia from low levels of vitamin B-12 in the blood -an unusual or allergic reaction to leucovorin, folic acid, other medicines, foods, dyes, or preservatives -pregnant or trying to get pregnant -breast-feeding How should I use this medicine? This medicine is for injection into a muscle or into a vein. It is  given by a health care professional in a hospital or clinic setting. Talk to your pediatrician regarding the use of this medicine in children. Special care may be needed. Overdosage: If you think you have taken too much of this medicine contact a poison control center or emergency room at once. NOTE: This medicine is only for you. Do not share this medicine with others. What if I miss a dose? This does not apply. What may interact with this medicine? -capecitabine -fluorouracil -phenobarbital -phenytoin -primidone -trimethoprim-sulfamethoxazole This list may not describe all possible interactions. Give your health care provider a list of all the medicines, herbs, non-prescription drugs, or dietary supplements you use. Also tell them if you smoke, drink alcohol, or use illegal drugs. Some items may interact with your medicine. What should I watch for while using this medicine? Your condition will be monitored carefully while you are receiving this medicine. This medicine may increase the side effects of 5-fluorouracil, 5-FU. Tell your doctor or health care professional if you have diarrhea or mouth sores that do not get better or that get worse. What side effects may I notice from receiving this medicine? Side effects that you should report to your doctor or health care professional as soon as possible: -allergic reactions like skin rash, itching or hives, swelling of the face, lips, or tongue -breathing problems -fever, infection -mouth sores -unusual bleeding or bruising -unusually weak or tired Side effects that usually do not require medical attention (report to your doctor or health care professional if they continue or are bothersome): -constipation or diarrhea -loss of appetite -nausea,  vomiting This list may not describe all possible side effects. Call your doctor for medical advice about side effects. You may report side effects to FDA at 1-800-FDA-1088. Where should I keep my  medicine? This drug is given in a hospital or clinic and will not be stored at home. NOTE: This sheet is a summary. It may not cover all possible information. If you have questions about this medicine, talk to your doctor, pharmacist, or health care provider.  2018 Elsevier/Gold Standard (2007-12-29 16:50:29)   Fluorouracil, 5-FU injection What is this medicine? FLUOROURACIL, 5-FU (flure oh YOOR a sil) is a chemotherapy drug. It slows the growth of cancer cells. This medicine is used to treat many types of cancer like breast cancer, colon or rectal cancer, pancreatic cancer, and stomach cancer. This medicine may be used for other purposes; ask your health care provider or pharmacist if you have questions. COMMON BRAND NAME(S): Adrucil What should I tell my health care provider before I take this medicine? They need to know if you have any of these conditions: -blood disorders -dihydropyrimidine dehydrogenase (DPD) deficiency -infection (especially a virus infection such as chickenpox, cold sores, or herpes) -kidney disease -liver disease -malnourished, poor nutrition -recent or ongoing radiation therapy -an unusual or allergic reaction to fluorouracil, other chemotherapy, other medicines, foods, dyes, or preservatives -pregnant or trying to get pregnant -breast-feeding How should I use this medicine? This drug is given as an infusion or injection into a vein. It is administered in a hospital or clinic by a specially trained health care professional. Talk to your pediatrician regarding the use of this medicine in children. Special care may be needed. Overdosage: If you think you have taken too much of this medicine contact a poison control center or emergency room at once. NOTE: This medicine is only for you. Do not share this medicine with others. What if I miss a dose? It is important not to miss your dose. Call your doctor or health care professional if you are unable to keep an  appointment. What may interact with this medicine? -allopurinol -cimetidine -dapsone -digoxin -hydroxyurea -leucovorin -levamisole -medicines for seizures like ethotoin, fosphenytoin, phenytoin -medicines to increase blood counts like filgrastim, pegfilgrastim, sargramostim -medicines that treat or prevent blood clots like warfarin, enoxaparin, and dalteparin -methotrexate -metronidazole -pyrimethamine -some other chemotherapy drugs like busulfan, cisplatin, estramustine, vinblastine -trimethoprim -trimetrexate -vaccines Talk to your doctor or health care professional before taking any of these medicines: -acetaminophen -aspirin -ibuprofen -ketoprofen -naproxen This list may not describe all possible interactions. Give your health care provider a list of all the medicines, herbs, non-prescription drugs, or dietary supplements you use. Also tell them if you smoke, drink alcohol, or use illegal drugs. Some items may interact with your medicine. What should I watch for while using this medicine? Visit your doctor for checks on your progress. This drug may make you feel generally unwell. This is not uncommon, as chemotherapy can affect healthy cells as well as cancer cells. Report any side effects. Continue your course of treatment even though you feel ill unless your doctor tells you to stop. In some cases, you may be given additional medicines to help with side effects. Follow all directions for their use. Call your doctor or health care professional for advice if you get a fever, chills or sore throat, or other symptoms of a cold or flu. Do not treat yourself. This drug decreases your body's ability to fight infections. Try to avoid being around people who are sick.  This medicine may increase your risk to bruise or bleed. Call your doctor or health care professional if you notice any unusual bleeding. Be careful brushing and flossing your teeth or using a toothpick because you may get  an infection or bleed more easily. If you have any dental work done, tell your dentist you are receiving this medicine. Avoid taking products that contain aspirin, acetaminophen, ibuprofen, naproxen, or ketoprofen unless instructed by your doctor. These medicines may hide a fever. Do not become pregnant while taking this medicine. Women should inform their doctor if they wish to become pregnant or think they might be pregnant. There is a potential for serious side effects to an unborn child. Talk to your health care professional or pharmacist for more information. Do not breast-feed an infant while taking this medicine. Men should inform their doctor if they wish to father a child. This medicine may lower sperm counts. Do not treat diarrhea with over the counter products. Contact your doctor if you have diarrhea that lasts more than 2 days or if it is severe and watery. This medicine can make you more sensitive to the sun. Keep out of the sun. If you cannot avoid being in the sun, wear protective clothing and use sunscreen. Do not use sun lamps or tanning beds/booths. What side effects may I notice from receiving this medicine? Side effects that you should report to your doctor or health care professional as soon as possible: -allergic reactions like skin rash, itching or hives, swelling of the face, lips, or tongue -low blood counts - this medicine may decrease the number of white blood cells, red blood cells and platelets. You may be at increased risk for infections and bleeding. -signs of infection - fever or chills, cough, sore throat, pain or difficulty passing urine -signs of decreased platelets or bleeding - bruising, pinpoint red spots on the skin, black, tarry stools, blood in the urine -signs of decreased red blood cells - unusually weak or tired, fainting spells, lightheadedness -breathing problems -changes in vision -chest pain -mouth sores -nausea and vomiting -pain, swelling, redness  at site where injected -pain, tingling, numbness in the hands or feet -redness, swelling, or sores on hands or feet -stomach pain -unusual bleeding Side effects that usually do not require medical attention (report to your doctor or health care professional if they continue or are bothersome): -changes in finger or toe nails -diarrhea -dry or itchy skin -hair loss -headache -loss of appetite -sensitivity of eyes to the light -stomach upset -unusually teary eyes This list may not describe all possible side effects. Call your doctor for medical advice about side effects. You may report side effects to FDA at 1-800-FDA-1088. Where should I keep my medicine? This drug is given in a hospital or clinic and will not be stored at home. NOTE: This sheet is a summary. It may not cover all possible information. If you have questions about this medicine, talk to your doctor, pharmacist, or health care provider.  2018 Elsevier/Gold Standard (2007-10-28 13:53:16)   Bevacizumab injection (Avastin) What is this medicine? BEVACIZUMAB (be va SIZ yoo mab) is a monoclonal antibody. It is used to treat many types of cancer. This medicine may be used for other purposes; ask your health care provider or pharmacist if you have questions. COMMON BRAND NAME(S): Avastin What should I tell my health care provider before I take this medicine? They need to know if you have any of these conditions: -diabetes -heart disease -high blood pressure -  history of coughing up blood -prior anthracycline chemotherapy (e.g., doxorubicin, daunorubicin, epirubicin) -recent or ongoing radiation therapy -recent or planning to have surgery -stroke -an unusual or allergic reaction to bevacizumab, hamster proteins, mouse proteins, other medicines, foods, dyes, or preservatives -pregnant or trying to get pregnant -breast-feeding How should I use this medicine? This medicine is for infusion into a vein. It is given by a  health care professional in a hospital or clinic setting. Talk to your pediatrician regarding the use of this medicine in children. Special care may be needed. Overdosage: If you think you have taken too much of this medicine contact a poison control center or emergency room at once. NOTE: This medicine is only for you. Do not share this medicine with others. What if I miss a dose? It is important not to miss your dose. Call your doctor or health care professional if you are unable to keep an appointment. What may interact with this medicine? Interactions are not expected. This list may not describe all possible interactions. Give your health care provider a list of all the medicines, herbs, non-prescription drugs, or dietary supplements you use. Also tell them if you smoke, drink alcohol, or use illegal drugs. Some items may interact with your medicine. What should I watch for while using this medicine? Your condition will be monitored carefully while you are receiving this medicine. You will need important blood work and urine testing done while you are taking this medicine. This medicine may increase your risk to bruise or bleed. Call your doctor or health care professional if you notice any unusual bleeding. This medicine should be started at least 28 days following major surgery and the site of the surgery should be totally healed. Check with your doctor before scheduling dental work or surgery while you are receiving this treatment. Talk to your doctor if you have recently had surgery or if you have a wound that has not healed. Do not become pregnant while taking this medicine or for 6 months after stopping it. Women should inform their doctor if they wish to become pregnant or think they might be pregnant. There is a potential for serious side effects to an unborn child. Talk to your health care professional or pharmacist for more information. Do not breast-feed an infant while taking this  medicine and for 6 months after the last dose. This medicine has caused ovarian failure in some women. This medicine may interfere with the ability to have a child. You should talk to your doctor or health care professional if you are concerned about your fertility. What side effects may I notice from receiving this medicine? Side effects that you should report to your doctor or health care professional as soon as possible: -allergic reactions like skin rash, itching or hives, swelling of the face, lips, or tongue -chest pain or chest tightness -chills -coughing up blood -high fever -seizures -severe constipation -signs and symptoms of bleeding such as bloody or black, tarry stools; red or dark-brown urine; spitting up blood or brown material that looks like coffee grounds; red spots on the skin; unusual bruising or bleeding from the eye, gums, or nose -signs and symptoms of a blood clot such as breathing problems; chest pain; severe, sudden headache; pain, swelling, warmth in the leg -signs and symptoms of a stroke like changes in vision; confusion; trouble speaking or understanding; severe headaches; sudden numbness or weakness of the face, arm or leg; trouble walking; dizziness; loss of balance or coordination -stomach pain -  sweating -swelling of legs or ankles -vomiting -weight gain Side effects that usually do not require medical attention (report to your doctor or health care professional if they continue or are bothersome): -back pain -changes in taste -decreased appetite -dry skin -nausea -tiredness This list may not describe all possible side effects. Call your doctor for medical advice about side effects. You may report side effects to FDA at 1-800-FDA-1088. Where should I keep my medicine? This drug is given in a hospital or clinic and will not be stored at home. NOTE: This sheet is a summary. It may not cover all possible information. If you have questions about this  medicine, talk to your doctor, pharmacist, or health care provider.  2018 Elsevier/Gold Standard (2016-06-21 14:33:29)

## 2017-07-04 NOTE — Progress Notes (Signed)
SATURATION QUALIFICATIONS: (This note is used to comply with regulatory documentation for home oxygen)  Patient Saturations on Room Air at Rest = 99%  Patient Saturations on Room Air while Ambulating (about 150 ft)= 98% and HR 120 bmp

## 2017-07-04 NOTE — Progress Notes (Signed)
Per Dr Burr Medico ok to tx today with VS and labs.   When pt arrived in tx area he was extremely SOB, 2L O2 started, 98% O2 saturation.   Per Dr Burr Medico pt's O2 saturation to be taken while pt disconnected from O2 at the end of tx, once while sitting and once  walking. This information given to RN at hand off.

## 2017-07-05 LAB — CULTURE, BLOOD (ROUTINE X 2)
Culture: NO GROWTH
Culture: NO GROWTH
Special Requests: ADEQUATE
Special Requests: ADEQUATE

## 2017-07-10 ENCOUNTER — Encounter: Payer: Self-pay | Admitting: Hematology

## 2017-07-10 ENCOUNTER — Telehealth: Payer: Self-pay | Admitting: Hematology

## 2017-07-10 ENCOUNTER — Inpatient Hospital Stay: Payer: BLUE CROSS/BLUE SHIELD | Attending: Hematology | Admitting: Hematology

## 2017-07-10 ENCOUNTER — Ambulatory Visit (HOSPITAL_BASED_OUTPATIENT_CLINIC_OR_DEPARTMENT_OTHER): Payer: BLUE CROSS/BLUE SHIELD

## 2017-07-10 ENCOUNTER — Ambulatory Visit: Payer: BLUE CROSS/BLUE SHIELD

## 2017-07-10 ENCOUNTER — Other Ambulatory Visit (HOSPITAL_BASED_OUTPATIENT_CLINIC_OR_DEPARTMENT_OTHER): Payer: BLUE CROSS/BLUE SHIELD

## 2017-07-10 VITALS — BP 122/73 | HR 89 | Temp 98.0°F | Resp 14 | Ht 66.0 in | Wt 216.8 lb

## 2017-07-10 DIAGNOSIS — Z5111 Encounter for antineoplastic chemotherapy: Secondary | ICD-10-CM | POA: Diagnosis not present

## 2017-07-10 DIAGNOSIS — G629 Polyneuropathy, unspecified: Secondary | ICD-10-CM

## 2017-07-10 DIAGNOSIS — C189 Malignant neoplasm of colon, unspecified: Secondary | ICD-10-CM

## 2017-07-10 DIAGNOSIS — C187 Malignant neoplasm of sigmoid colon: Secondary | ICD-10-CM

## 2017-07-10 DIAGNOSIS — C7800 Secondary malignant neoplasm of unspecified lung: Secondary | ICD-10-CM

## 2017-07-10 DIAGNOSIS — Z8674 Personal history of sudden cardiac arrest: Secondary | ICD-10-CM | POA: Diagnosis not present

## 2017-07-10 DIAGNOSIS — C7802 Secondary malignant neoplasm of left lung: Secondary | ICD-10-CM

## 2017-07-10 DIAGNOSIS — R030 Elevated blood-pressure reading, without diagnosis of hypertension: Secondary | ICD-10-CM | POA: Insufficient documentation

## 2017-07-10 DIAGNOSIS — Z5112 Encounter for antineoplastic immunotherapy: Secondary | ICD-10-CM | POA: Insufficient documentation

## 2017-07-10 DIAGNOSIS — R110 Nausea: Secondary | ICD-10-CM | POA: Insufficient documentation

## 2017-07-10 DIAGNOSIS — C7801 Secondary malignant neoplasm of right lung: Secondary | ICD-10-CM

## 2017-07-10 DIAGNOSIS — Z95828 Presence of other vascular implants and grafts: Secondary | ICD-10-CM

## 2017-07-10 DIAGNOSIS — R0600 Dyspnea, unspecified: Secondary | ICD-10-CM | POA: Diagnosis not present

## 2017-07-10 DIAGNOSIS — F419 Anxiety disorder, unspecified: Secondary | ICD-10-CM | POA: Diagnosis not present

## 2017-07-10 DIAGNOSIS — R079 Chest pain, unspecified: Secondary | ICD-10-CM

## 2017-07-10 DIAGNOSIS — E669 Obesity, unspecified: Secondary | ICD-10-CM

## 2017-07-10 DIAGNOSIS — R05 Cough: Secondary | ICD-10-CM | POA: Insufficient documentation

## 2017-07-10 DIAGNOSIS — R197 Diarrhea, unspecified: Secondary | ICD-10-CM | POA: Insufficient documentation

## 2017-07-10 LAB — COMPREHENSIVE METABOLIC PANEL
ALK PHOS: 101 U/L (ref 40–150)
ALT: 20 U/L (ref 0–55)
AST: 17 U/L (ref 5–34)
Albumin: 3.5 g/dL (ref 3.5–5.0)
Anion Gap: 9 mEq/L (ref 3–11)
BUN: 10.9 mg/dL (ref 7.0–26.0)
CALCIUM: 9.1 mg/dL (ref 8.4–10.4)
CHLORIDE: 102 meq/L (ref 98–109)
CO2: 26 mEq/L (ref 22–29)
CREATININE: 0.8 mg/dL (ref 0.7–1.3)
EGFR: >60 mL/min/{1.73_m2} (ref >60)
Glucose: 128 mg/dl (ref 70–140)
Potassium: 4.2 mEq/L (ref 3.5–5.1)
Sodium: 136 mEq/L (ref 136–145)
Total Bilirubin: 0.38 mg/dL (ref 0.20–1.20)
Total Protein: 7.1 g/dL (ref 6.4–8.3)

## 2017-07-10 LAB — CBC WITH DIFFERENTIAL/PLATELET
BASO%: 0.2 % (ref 0.0–2.0)
Basophils Absolute: 0 10*3/uL (ref 0.0–0.1)
EOS ABS: 0.2 10*3/uL (ref 0.0–0.5)
EOS%: 2.7 % (ref 0.0–7.0)
HCT: 40.7 % (ref 38.4–49.9)
HGB: 13.5 g/dL (ref 13.0–17.1)
LYMPH%: 17.7 % (ref 14.0–49.0)
MCH: 28.7 pg (ref 27.2–33.4)
MCHC: 33.2 g/dL (ref 32.0–36.0)
MCV: 86.6 fL (ref 79.3–98.0)
MONO#: 0.6 10*3/uL (ref 0.1–0.9)
MONO%: 7.7 % (ref 0.0–14.0)
NEUT%: 71.7 % (ref 39.0–75.0)
NEUTROS ABS: 5.9 10*3/uL (ref 1.5–6.5)
Platelets: 260 10*3/uL (ref 140–400)
RBC: 4.7 10*6/uL (ref 4.20–5.82)
RDW: 12.9 % (ref 11.0–14.6)
WBC: 8.2 10*3/uL (ref 4.0–10.3)
lymph#: 1.5 10*3/uL (ref 0.9–3.3)

## 2017-07-10 LAB — CEA (IN HOUSE-CHCC): CEA (CHCC-In House): 41.26 ng/mL — ABNORMAL HIGH (ref 0.00–5.00)

## 2017-07-10 MED ORDER — SODIUM CHLORIDE 0.9 % IV SOLN
Freq: Once | INTRAVENOUS | Status: AC
  Administered 2017-07-10: 13:00:00 via INTRAVENOUS

## 2017-07-10 MED ORDER — LEUCOVORIN CALCIUM INJECTION 350 MG
500.0000 mg/m2 | Freq: Once | INTRAVENOUS | Status: AC
  Administered 2017-07-10: 1090 mg via INTRAVENOUS
  Filled 2017-07-10: qty 54.5

## 2017-07-10 MED ORDER — PROCHLORPERAZINE MALEATE 10 MG PO TABS
ORAL_TABLET | ORAL | Status: AC
  Filled 2017-07-10: qty 1

## 2017-07-10 MED ORDER — SODIUM CHLORIDE 0.9% FLUSH
10.0000 mL | INTRAVENOUS | Status: DC | PRN
  Administered 2017-07-10: 10 mL
  Filled 2017-07-10: qty 10

## 2017-07-10 MED ORDER — HEPARIN SOD (PORK) LOCK FLUSH 100 UNIT/ML IV SOLN
500.0000 [IU] | Freq: Once | INTRAVENOUS | Status: AC | PRN
  Administered 2017-07-10: 500 [IU]
  Filled 2017-07-10: qty 5

## 2017-07-10 MED ORDER — SODIUM CHLORIDE 0.9% FLUSH
10.0000 mL | INTRAVENOUS | Status: DC | PRN
  Administered 2017-07-10: 10 mL via INTRAVENOUS
  Filled 2017-07-10: qty 10

## 2017-07-10 MED ORDER — PROCHLORPERAZINE MALEATE 10 MG PO TABS
10.0000 mg | ORAL_TABLET | Freq: Once | ORAL | Status: AC
  Administered 2017-07-10: 10 mg via ORAL

## 2017-07-10 MED ORDER — FLUOROURACIL CHEMO INJECTION 2.5 GM/50ML
600.0000 mg/m2 | Freq: Once | INTRAVENOUS | Status: AC
  Administered 2017-07-10: 1300 mg via INTRAVENOUS
  Filled 2017-07-10: qty 26

## 2017-07-10 MED ORDER — HYDROCODONE-ACETAMINOPHEN 5-325 MG PO TABS: 1.0000 | ORAL_TABLET | Freq: Four times a day (QID) | ORAL | 0 refills | Status: DC | PRN

## 2017-07-10 NOTE — Patient Instructions (Signed)
[facility] Discharge Instructions for Patients Receiving Chemotherapy  Today you received the following chemotherapy agents Leucovorin and 5FU  To help prevent nausea and vomiting after your treatment, we encourage you to take your nausea medication as directed If you develop nausea and vomiting that is not controlled by your nausea medication, call the clinic.   BELOW ARE SYMPTOMS THAT SHOULD BE REPORTED IMMEDIATELY:  *FEVER GREATER THAN 100.5 F  *CHILLS WITH OR WITHOUT FEVER  NAUSEA AND VOMITING THAT IS NOT CONTROLLED WITH YOUR NAUSEA MEDICATION  *UNUSUAL SHORTNESS OF BREATH  *UNUSUAL BRUISING OR BLEEDING  TENDERNESS IN MOUTH AND THROAT WITH OR WITHOUT PRESENCE OF ULCERS  *URINARY PROBLEMS  *BOWEL PROBLEMS  UNUSUAL RASH Items with * indicate a potential emergency and should be followed up as soon as possible.  Feel free to call the clinic should you have any questions or concerns. The clinic phone number is [phone].  Please show the CHEMO ALERT CARD at check-in to the Emergency Department and triage nurse.   

## 2017-07-10 NOTE — Progress Notes (Signed)
[place]  [phone] [phone]  Clinic Follow Up Note   Date of Service: 07/10/2017  CHIEF COMPLAINTS:  Follow Up metastatic colon cancer  Oncology History   T2, Colon cancer metastasized to lung [facility])   Staging form: Colon and Rectum, AJCC 7th Edition     Clinical stage from 03/30/2015: Stage Unknown ([place], N1, M1) - Unsigned       Colon cancer metastasized to lung [facility]) s/p laparoscopic assisted sigmoid colectomy 11/02/15   03/30/2015 Miscellaneous    Foundation one genomic testing showed TP53 mutation, MSI stable, low tumor mutation burden. Negative for K-ras, NRAS and BRAF      03/30/2015 Initial Biopsy    Sigmoid mass biopsy showed invasive adenocarcinoma. Cecal colon polyps showed tubular adenoma.      03/30/2015 Initial Diagnosis    Colon cancer      03/30/2015 Procedure    colonoscopy by Dr. Michail Sermon showed a fungating, infiltrative and ulcerated nonobstructing large mass in the sigmoid colon and at 20 cm proximal to the anus. The mass was partially circumferential no bleeding. A 10 mm polyps in the cecum was removed.      04/03/2015 Imaging    CT chest, abdomen and pelvis with contrast showed nodular masslike area of clinical worsening at rectosigmoid junction, tiny pericolonic lymph nodes, bilateral pulmonary nodules measuring about 1 cm.      04/12/2015 PET scan    Hypermetabolic colonic mass near rectosigmoid junction, tiny subcentimeter paracolonic lymph nodes. Bilateral pulmonary metastasis.      04/19/2015 Procedure    CT-guided lung nodule biopsy attempted, unsuccessful.      04/27/2015 - 09/14/2015 Chemotherapy    Oxaliplatin 130 mg/m on day 1, Capecitabine 2344m q12hr, 2 weeks on and one week off (only received 7 days for first cycle), oxaliplatin held on cycle 5 and dose reduced to 1076mm2, capecitabine reduced to 200038m12h D1-14, stopped per pt's request.       06/29/2015 - 10/19/2015 Chemotherapy     Panitumumab  every 2 weeks, some cycles were postponed due to pt's request, stoppe per pt's request       09/18/2015 Imaging    Pulmonary nodules are less hypermetabolic, stable size. Stable hypermetabolic portal hepatis and abdominal peritoneal ligament lymph nodes. No other new lesions.       11/02/2015 Surgery    sigmoid colon segmental resection       11/02/2015 Pathology Results    Sigmoid colon segmental resection showed adenocarcinoma, grade 3,  T2, 3 out of 13 lymph nodes were positive, surgical margins were negative. LVI(-), perineural invasion negative      11/13/2015 Imaging    CT chest, abdomen and pelvis with contrast showed postsurgical changes, mild progression of pulmonary metastasis, measuring up to 15 mm in the right lower lobe.      11/29/2015 - 12/05/2015 Radiation Therapy    SBRT to 4 lung lesions in 3 sessions       04/25/2016 - 08/05/2016 Chemotherapy    FOLFIRI every 2 weeks, and Avastin added from cycle 3, 5-FU held since cycle 2 due to poor tolerance. Chemo was stopped after 3 months due to overall poor tolerance and pt's request to stop.       06/27/2016 Imaging    CT CAP 06/27/16 IMPRESSION: Status post partial left hemicolectomy. Progressive wall thickening involving the colon near the suture line, worrisome for residual tumor. Adjacent 7 mm short axis pericolonic lymph node, possibly reflecting a nodal metastasis. Radiation changes  in the posterior right upper lobe and superior segment right lower lobe. Progressive pulmonary metastases bilaterally, measuring up to 13 mm, as above.      09/17/2016 Imaging    CT A/P/C w contrast  IMPRESSION: 1. Multifocal pulmonary nodules are not significantly changed in size compared with the previous exam. 2. Stable appearance of radiation changes within the right midlung. 3. Stable to scratch set improved appearance of wall thickening involving the colon at the level of the suture line.       11/11/2016 Imaging    CT Chest WO  Contrast 11/11/16: IMPRESSION: 1. Minimal enlargement of some pulmonary metastases. 2. Coronary artery calcification. 3. Hepatic steatosis.       02/10/2017 Imaging    CT CAP W Contrast 02/10/17 IMPRESSION: 1. Progressive metastatic disease to the thorax as demonstrated by increased size of numerous previously noted pulmonary nodules, what appears to be lymphangitic spread of disease developing in the right upper lobe and superior segment of the right lower lobe, and new 1.5 cm short axis prevascular lymph node. 2. No definite findings of metastatic disease in the abdomen or pelvis. 3. Multiple tiny nonobstructive calculi in the collecting systems of the kidneys bilaterally measuring 2-3 mm in size. No ureteral stones or findings of urinary tract obstruction are noted at this time. 4. Hepatic steatosis. 5. Aortic atherosclerosis, in addition to left anterior descending coronary artery disease. Please note that although the presence of coronary artery calcium documents the presence of coronary artery disease, the severity of this disease and any potential stenosis cannot be assessed on this non-gated CT examination. Assessment for potential risk factor modification, dietary therapy or pharmacologic therapy may be warranted, if clinically indicated. 6. There are calcifications of the aortic valve. Echocardiographic correlation for evaluation of potential valvular dysfunction may be warranted if clinically indicated.       02/27/2017 -  Chemotherapy    Third-line chemotherapy Xeloda for 2 weeks on and 1 week off with Oxaliplatin every 3 weeks and Panitumumab every 2 weeks on a different day. He developed anaphylactic reaction to oxaliplatin, coded, oxaliplatin was subsequently stopped. He did not to tolerate Xeloda, plan to change to 5-FU bolus weekly, Held chemo since 03/20/17.   Restarted panitumumab every 2 weeks on 05/01/17        06/11/2017 Imaging    CT CAP W Contrast 06/11/17    IMPRESSION: 1. Continued marked interval progression of pulmonary metastatic disease with mediastinal and left hilar metastases evident. 2. Interval development of a 4.3 cm metastatic lesion in the dome of the liver. 3. Stable nonobstructing nephrolithiasis.       HISTORY OF PRESENTING ILLNESS:  Bill Wright 57 y.o. male is here because of recently newly diagnosed colon cancer.  He has had intermittent bloody stool for 2 years, it has been mild, mixed with stool, patient does not have any abdominal pain, constipation, change of his bowel habits, nausea, weight loss or other symptoms. He did not seek medical attention for this. He went to emergency room on 03/15/2015 for right flank pain, due to his kidney stone. CT scan incidentally found a 11 mm right lower lobe nodule and mucosal edema in the sigmoid colon. He saw his primary care physician, and was referred to GI Dr. Michail Sermon here at he underwent colonoscopy on 03/30/2015, which showed a fungating infiltrative and ulcerated nonobstructing large mass in the sigmoid colon, biopsy showed adenocarcinoma. CT chest abdomen and pelvis showed multiple lung nodules measuring about 1 cm. He was  referred to surgeon Dr. Zella Richer, who referred patient to [place] for further workup of his lung nodule and discuss chemotherapy.  He feels very well overall, denies any symptoms. 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 He never had screening colonoscopy prior the reason one, no significant past medical history, does not see doctors regularly.   CURRENT TREATMENT:  restarted panitumumab on 05/01/17 for every 2 weeks ended on 06/13/17 due to disease progression. Switch to leucovorin/5-fu  with avastin and leucovorin bolus starting 06/26/17   INTERIM HISTORY  Thayne returns for follow-up. He reports that he has been tolerating this round of chemotherapy well and he does not have any new acute complaints. He continues to have a mild  cough, but this has been improving. He does need a refill of his Hydrocodone. He denies any issues with bleeding/bruising. He denies any dyspnea on exertion. He did have some mild nausea at home, but he denies any issues with vomiting. No fevers.    MEDICAL HISTORY:  Past Medical History:  Diagnosis Date  . Anxiety    situational due to cancer diagnosis  . Cancer [facility]) 2017   colon-chemo 09/22/15 now surgery  . Hypercholesteremia   . Kidney calculi     SURGICAL HISTORY: Past Surgical History:  Procedure Laterality Date  . COLONOSCOPY  03/30/15  . IR CV LINE INJECTION  04/09/2017  . IR FLUORO GUIDE PORT INSERTION LEFT  06/26/2017  . IR FLUORO GUIDE PORT INSERTION RIGHT  04/15/2017  . IR REMOVAL TUN ACCESS W/ PORT W/O FL MOD SED  04/15/2017  . IR REMOVAL TUN ACCESS W/ PORT W/O FL MOD SED  06/26/2017  . IR US GUIDE VASC ACCESS LEFT  06/26/2017  . IR US GUIDE VASC ACCESS RIGHT  04/15/2017  . LAPAROSCOPIC PARTIAL COLECTOMY N/A 11/02/2015   Procedure: LAPAROSCOPIC ASSISTED SIGMOID COLECTOMY;  Surgeon: Jackolyn Confer, MD;  Location: [facility];  Service: General;  Laterality: N/A;  . LEFT HEART CATH AND CORONARY ANGIOGRAPHY N/A 03/21/2017   Procedure: LEFT HEART CATH AND CORONARY ANGIOGRAPHY;  Surgeon: Dixie Dials, MD;  Location: [place] [facility];  Service: Cardiovascular;  Laterality: N/A;  . PORTACATH PLACEMENT N/A 05/08/2016   Procedure: INSERTION PORT-A-CATH;  Surgeon: Jackolyn Confer, MD;  Location: [facility];  Service: General;  Laterality: N/A;    SOCIAL HISTORY: Social History   Social History  . Marital Status: Married    Spouse Name: N/A  . Number of Children: 3, age of 76, 53 and 60   . Years of Education: N/A   Occupational History  . Banker for [employer] [place]    Social History Main Topics  . Smoking status: Never Smoker   . Smokeless tobacco: Not on file  . Alcohol Use: No  . Drug Use: No  . Sexual Activity: Not on file   Other Topics Concern  . Not on file   Social  History Narrative    FAMILY HISTORY: Family History  Problem Relation Age of Onset  . Breast cancer Mother 75       +rad and lymph node  . Diabetes Maternal Grandmother        leading to blindness  . Obesity Maternal Aunt   . Stroke Maternal Uncle 34    ALLERGIES:  is allergic to oxaliplatin.  MEDICATIONS:  Current Outpatient Medications  Medication Sig Dispense Refill  . albuterol (VENTOLIN HFA) 108 (90 Base) MCG/ACT inhaler Inhale 1 puff into the lungs daily  as needed for wheezing or shortness of breath. 10 PUFFS DAILY FOR WHEEZING AND SHORTNESS OF BREATH    . clindamycin (CLINDAGEL) 1 % gel Apply 2 (two) times daily topically. 30 g 0  . diltiazem (CARDIZEM) 30 MG tablet Take 30 mg by mouth 3 (three) times daily.    . Fluticasone-Salmeterol (ADVAIR DISKUS) 250-50 MCG/DOSE AEPB Inhale 1 puff into the lungs 2 (two) times daily. (Patient taking differently: Inhale 1 puff into the lungs daily. ) 180 each 1  . HYDROcodone-acetaminophen (NORCO/VICODIN) 5-325 MG tablet Take 1 tablet by mouth every 6 (six) hours as needed for moderate pain or severe pain. 30 tablet 0  . LORazepam (ATIVAN) 0.5 MG tablet Take 1 tablet (0.5 mg total) by mouth every 6 (six) hours as needed (Nausea or vomiting). 30 tablet 0  . metoprolol tartrate (LOPRESSOR) 25 MG tablet Take 1 tablet (25 mg total) by mouth 2 (two) times daily. 60 tablet 3  . mometasone (ELOCON) 0.1 % cream Apply 1 application topically daily. 45 g 2  . ondansetron (ZOFRAN) 8 MG tablet Take 1 tablet (8 mg total) by mouth 2 (two) times daily as needed (Nausea or vomiting). 30 tablet 1  . prochlorperazine (COMPAZINE) 10 MG tablet Take 1 tablet (10 mg total) by mouth every 6 (six) hours as needed (Nausea or vomiting). 30 tablet 1  . Hydrocodone-Homatropine 5-1.5 MG TABS Take 1 tablet by mouth every 6 (six) hours as needed. (Patient not taking: Reported on 07/10/2017) 60 each 0   No current facility-administered medications for this visit.     Facility-Administered Medications Ordered in Other Visits  Medication Dose Route Frequency Provider Last Rate Last Dose  . fluorouracil (ADRUCIL) chemo injection 1,300 mg  600 mg/m2 (Treatment Plan Recorded) Intravenous Once Truitt Merle, MD      . heparin lock flush 100 unit/mL  500 Units Intracatheter Once PRN Truitt Merle, MD      . leucovorin 1,090 mg in dextrose 5 % 250 mL infusion  500 mg/m2 (Treatment Plan Recorded) Intravenous Once Truitt Merle, MD 203 mL/hr at 07/10/17 1351 1,090 mg at 07/10/17 1351  . sodium chloride flush (NS) 0.9 % injection 10 mL  10 mL Intracatheter PRN Truitt Merle, MD        REVIEW OF SYSTEMS:  Constitutional: Denies fevers, chills or abnormal night sweats, no weight loss. Eyes: Denies blurriness of vision, double vision or watery eyes Ears, nose, mouth, throat, and face: Denies mucositis or sore throat Respiratory: Denies wheezes (+) worsening dry cough (+) SOB Cardiovascular: (+) palpitations and chest pain Gastrointestinal:  Denies heartburn or change in bowel habits Skin: increased diffuse rash to face, scalp and upper chest Lymphatics: Denies new lymphadenopathy or easy bruising Neurological:Denies numbness, tingling or new weaknesses MSK: normal Behavioral/Psych: Mood is stable, no new changes  All other systems were reviewed with the patient and are negative.  PHYSICAL EXAMINATION:  ECOG PERFORMANCE STATUS: 1 Vitals:   07/10/17 1131  BP: 122/73  Pulse: 89  Resp: 14  Temp: 98 F (36.7 C)  TempSrc: Oral  SpO2: 97%  Weight: 216 lb 12.8 oz (98.3 kg)  Height: 5' 6"  (1.676 m)    GENERAL:alert, no distress and comfortable SKIN: skin color, texture, turgor are normal. mild skin erythema on his cheeks. Diffuse acne-like skin rash on his face, scalp, neck and upper chest, no skin erythema or signs of infection.  EYES: normal, conjunctiva are pink and non-injected, sclera clear OROPHARYNX:no exudate, no erythema and lips, buccal mucosa, and tongue  normal   NECK: supple, thyroid normal size, non-tender, without nodularity LYMPH:  no palpable lymphadenopathy in the cervical, axillary or inguinal LUNGS: clear to auscultation and percussion with normal breathing effort HEART: regular rate & rhythm and no murmurs and no lower extremity edema ABDOMEN:abdomen soft, non-tender and normal bowel sounds. The midline incision has healed well, no discharge or skin erythema  Musculoskeletal:no cyanosis of digits and no clubbing  PSYCH: alert & oriented x 3 with fluent speech NEURO: no focal motor/sensory deficits  LABORATORY DATA:  I have reviewed the data as listed CBC Latest Ref Rng & Units 07/10/2017 07/04/2017 06/30/2017  WBC 4.0 - 10.3 10e3/uL 8.2 7.7 11.8(H)  Hemoglobin 13.0 - 17.1 g/dL 13.5 14.3 15.0  Hematocrit 38.4 - 49.9 % 40.7 42.9 44.6  Platelets 140 - 400 10e3/uL 260 274 279   CMP Latest Ref Rng & Units 07/10/2017 07/04/2017 06/30/2017  Glucose 70 - 140 mg/dl 128 136 107(H)  BUN 7.0 - 26.0 mg/dL 10.9 8.0 10  Creatinine 0.7 - 1.3 mg/dL 0.8 0.9 0.72  Sodium 136 - 145 mEq/L 136 138 137  Potassium 3.5 - 5.1 mEq/L 4.2 3.9 3.9  Chloride 101 - 111 mmol/L - - 101  CO2 22 - 29 mEq/L 26 27 27   Calcium 8.4 - 10.4 mg/dL 9.1 9.6 9.1  Total Protein 6.4 - 8.3 g/dL 7.1 7.4 7.7  Total Bilirubin 0.20 - 1.20 mg/dL 0.38 0.54 0.8  Alkaline Phos 40 - 150 U/L 101 99 97  AST 5 - 34 U/L 17 20 23   ALT 0 - 55 U/L 20 25 24     CEA:  04/20/2016: <0.5 06/08/2015: <0.5 09/07/2015: <0.5 11/15/2015: 0.9 02/16/2016: <1.0  04/25/2016: 1.6 05/23/16: <1.00 06/20/2016: 1.05 07/22/2016: <1.00 09/17/16: <1.00 11/14/16: 1.60 02/10/17: 5.94 03/20/2017: 4.92 05/01/17: 13.29 05/30/17: 19.94 06/13/17: 27.88 07/10/17: PENDING     PATHOLOGY REPORT  Diagnosis 11/02/2015 1. Colon, segmental resection for tumor, sigmoid ADENOCARCINOMA OF THE SIGMOID COLON (2.0 CM), GRADE 3 THE TUMOR INVADES MUSCULARIS PROPRIA MARGINS OF RESECTION ARE NEGATIVE METASTATIC ADENOCARCINOMA IN  THREE OF THIRTEEN LYMPH NODES (3/13) 2. Colon, resection margin (donut), distal sigmoid BENIGN COLONIC TISSUE Microscopic Comment 1. COLON AND RECTUM (INCLUDING TRANS-ANAL RESECTION): Specimen: Sigmoid Procedure: Segmental resection Tumor site: sigmoid Specimen integrity: Intact Macroscopic intactness of mesorectum: Not applicable: x Complete: NA Near complete: NA Incomplete: NA Cannot be determined (specify): NA Macroscopic tumor perforation: Muscularis Invasive tumor: Maximum size: 2.0 cm Histologic type(s): Adenocarcinoma Histologic grade and differentiation: G3 G1: well differentiated/low grade G2: moderately differentiated/low grade G3: poorly differentiated/high grade G4: undifferentiated/high grade Type of polyp in which invasive carcinoma arose: Tubular adenoma Microscopic extension of invasive tumor: Muscularis propria Lymph-Vascular invasion: Negative Peri-neural invasion: Negative Tumor deposit(s) (discontinuous extramural extension): Negative Resection margins: Proximal margin: Negative Distal margin: Negative Circumferential (radial) (posterior ascending, posterior descending; lateral and posterior mid-rectum; and entire lower 1/3 rectum):Negative Mesenteric margin (sigmoid and transverse): Negative Distance closest margin (if all above margins negative): 3.5 cm Trans-anal resection margins only: Deep margin: NA Mucosal Margin: NA Distance closest mucosal margin (if negative): NA Treatment effect (neo-adjuvant therapy): Partial Additional polyp(s): None Non-neoplastic findings: unremarkable Lymph nodes: number examined 13; number positive: 3 Pathologic Staging: T2, N1b, M1a     RADIOGRAPHIC STUDIES: I have personally reviewed the radiological images as listed and agreed with the findings in the report.  PET 04/12/2015 IMPRESSION: Hypermetabolic colonic mass near rectosigmoid junction, consistent with known primary colon carcinoma. Tiny sub-cm  pericolonic lymph nodes in sigmoid mesocolon are too small  to characterize by PET, but are suspicious for early lymph node metastases. Bilateral pulmonary metastases  PET 09/18/2015 IMPRESSION: 1. Hypermetabolic pulmonary metastases have enlarged slightly from 07/18/2015. 2. Hypermetabolic porta hepatis/abdominal peritoneal ligament lymph nodes, stable from 04/12/2015. 3. Increase in hypermetabolism associated with mesenteric haziness and nodularity. While a reactive phenomenon/panniculitis can create this in appearance, metastatic disease/lymphoproliferative disorder cannot be excluded. 4. Hepatic steatosis. 5. Bilateral nephrolithiasis.  CT chest, abdomen and pelvis with contrast 06/27/2016 IMPRESSION: Status post partial left hemicolectomy. Progressive wall thickening involving the colon near the suture line, worrisome for residual tumor. Adjacent 7 mm short axis pericolonic lymph node, possibly reflecting a nodal metastasis. Radiation changes in the posterior right upper lobe and superior segment right lower lobe. Progressive pulmonary metastases bilaterally, measuring up to 13 mm, as above.   CT A/P/C w contrast 09/17/16:  IMPRESSION: 1. Multifocal pulmonary nodules are not significantly changed in size compared with the previous exam. 2. Stable appearance of radiation changes within the right midlung. 3. Stable to scratch set improved appearance of wall thickening involving the colon at the level of the suture line.   CT Chest WO Contrast 11/11/16: IMPRESSION: 1. Minimal enlargement of some pulmonary metastases. 2. Coronary artery calcification. 3. Hepatic steatosis.   CT CAP W Contrast 02/10/17 IMPRESSION: 1. Progressive metastatic disease to the thorax as demonstrated by increased size of numerous previously noted pulmonary nodules, what appears to be lymphangitic spread of disease developing in the right upper lobe and superior segment of the right lower  lobe, and new 1.5 cm short axis prevascular lymph node. 2. No definite findings of metastatic disease in the abdomen or pelvis. 3. Multiple tiny nonobstructive calculi in the collecting systems of the kidneys bilaterally measuring 2-3 mm in size. No ureteral stones or findings of urinary tract obstruction are noted at this time. 4. Hepatic steatosis. 5. Aortic atherosclerosis, in addition to left anterior descending coronary artery disease. Please note that although the presence of coronary artery calcium documents the presence of coronary artery disease, the severity of this disease and any potential stenosis cannot be assessed on this non-gated CT examination. Assessment for potential risk factor modification, dietary therapy or pharmacologic therapy may be warranted, if clinically indicated. 6. There are calcifications of the aortic valve. Echocardiographic correlation for evaluation of potential valvular dysfunction may be warranted if clinically indicated.   CT CAP W Contrast 06/11/17  IMPRESSION: 1. Continued marked interval progression of pulmonary metastatic disease with mediastinal and left hilar metastases evident. 2. Interval development of a 4.3 cm metastatic lesion in the dome of the liver. 3. Stable nonobstructing nephrolithiasis.  CT ANGIO CHEST PE 06/30/17  IMPRESSION: -No definite evidence of pulmonary embolus. -Stable aortopulmonary window adenopathy is noted. -Significantly increased subcarinal adenopathy is noted concerning for worsening metastatic disease. -Continued presence of left hilar mass or adenopathy is noted, which appears to be resulting in increased postobstructive atelectasis or pneumonia. -Stable pulmonary nodules or metastatic lesions are noted in both lower lobes. -Right hepatic metastatic lesion noted on prior exam is not well visualized currently.   ASSESSMENT & PLAN:  57 y.o. male, without significant past medical history except kidney  stone, presented with intermittent bloody stool for 2 years, and colonoscopy showed a large sigmoid colon mass, CT scan showed multiple (at least 4) nodules in bilateral lungs, measuring about 1 cm.  1. Sigmoid colon adenocarcinoma, pT2N1bM1, stage IV with lung mets, KRA/NRAS wild type, MSI-stable -I previously reviewed his colonoscopy, initial CT scan findings and the biopsy  results in great details with patient and his wife. -he received first line chemo FOLFOX and panitumumab, followed by hemicolectomy, SBRT to 4 lung nodules, and second line chemo irinotecan and avastin, chemotherapy was finally stopped due to his poor tolerance. -I previously reviewed his surgical pathology findings, which showed a residual T2 primary tumor, 3 lymph nodes positive, grade 3 disease, certainly high risk disease.  -We previously discussed that his disease is likely incurable, and the goal of therapy is maximum disease control and prolong his life.  -- His tumor does not contain KRAS/NRAS or BRAF mutation, so he would benefit from EGFR antibody, Panitumumab was added on from cycle 4 but tolerated poorly dur to skin rash  -he has been on and off chemo and panitumumab due to poor tolerance overall  --Unfortunately he previously developed anaphylactic reaction to oxaliplatin with cardiac arrest on 03/20/2017 and was successfully resuscitated, chemotherapy was held -no more oxaliplatin due to cardiac arrest  -We restarted Panitumumab every 2 weeks on 05/01/17, I recommended weekly 5-Fu bolus but pt does not feel he is mentally ready for chemo yet  -We reviewed his CT CAP images from 06/11/17 which shows significant growth in his pulmonary metastatic lesions and a new liver met.  -Antibody Panitumumab alone is not controlling this disease. I discussed the treatment options of 5-Fu bolus weekly (he refuses 5-fu pump) with Avastin and leucovorin,  Referral to other cancer center for clinical trail or taking oral biological  agent regorafenib or Lonsurf (5 days a week for 2 weeks and off 2 week).  -I discussed the goal of care is to control his disease, however we are close to exhausting treatment options outside of clinical trails. -After a lengthy discussion, He will proceed with weekly 5-fu bolus with avastin and leucovorin bolus.  -his dyspnea has significantly improved since his started 5-FU and Avastin last week, he tolerated well.  We will continue weekly -plan to repeat CT in 2 months   2. Cardiac arrest on 03/20/2017 -Secondary to oxaliplatin -He underwent cardiac catheterization, which was negative for coronary artery disease -He will continue follow-up with his cardiologist Dr. Dorthy Cooler  -He is on metoprolol, continues to follow cardiologist   3. Dry cough and dyspnea  -His initial cough is likely related to his SBRT, he received a course of prednisone  -I'm concerned his dry cough to be related to his cancer progression in the lung  -previously Much improved since he started chemotherapy -Cough got worsened and is ongoing and aggressive due to cancer progression.  -Continue Hycodan as needed -Cough has improved lately  4. Obesity  -I again encouraged him to eat healthy and exercise   5. Anxiety  -He has been having palpitation, sweating episodes, after his cardiac arrest event. Possible anxiety attacks  - I suggested he try low dose Xanax to help with his anxiety, he refuses at this time -Anxiety has improved some since he started chemo again   6. Left leg neuropathy -Possibly related to chemotherapy, also not very typical  -I suggest him to take over-the-counter vitamin B complex  7. Goal of care discussion  -We again discussed the incurable nature of his cancer, although his cancer seems to be more indolent -The patient understands the goal of care is palliative. -He is full code now   8.  Intermittent chest pain -He has been taking Oxycodone once a day as needed due to his high  intensity job and some chest discomfort which could be related to  stress.  -He really does not have much cancer related pain. We discussed narcotic addiction, I recommend him to gradually wean off oxycodone. He tried but not successful  -He is down to 1-2 hydrocodone weekly, refilled on 05/16/17 -I refilled his [place] today (07/10/17)   PLAN Lab, flush, LV and 5-fu bolus weekly X4, avastin in 1 and 3 weeks  F/u in 2 and 4 weeks  I spent 30 minutes counseling the patient face to face. The total time spent in the appointment was 40 minutes and more than 50% was on counseling.  This document serves as a record of services personally performed by Truitt Merle, MD. It was created on her behalf by Reola Mosher, a trained medical scribe. The creation of this record is based on the scribe's personal observations and the provider's statements to them. This document has been checked and approved by the attending provider.  I have reviewed the above documentation for accuracy and completeness, and I agree with the above.    Truitt Merle, MD 07/10/2017

## 2017-07-10 NOTE — Telephone Encounter (Signed)
Gave avs and calendar waiting for 1/10 and 1/11

## 2017-07-11 ENCOUNTER — Telehealth: Payer: Self-pay | Admitting: Hematology

## 2017-07-11 NOTE — Telephone Encounter (Signed)
Called patient regarding 1/10 °

## 2017-07-16 ENCOUNTER — Encounter: Payer: Self-pay | Admitting: General Practice

## 2017-07-16 NOTE — Progress Notes (Signed)
[facility] CSW Progress Note  Call from wife, Aurelio Mccamy.  Expressed severe caregiving stress, "this cancer is a family disease, we are all suffering."  Tearful, anxious re cancer treatment and financial stressors.  Wants to discuss options for caregiver support.  Appt set for Fri 1/11 at 10:30 AM in [place].  Wife states she will discuss her stress w husband and need to seek additional resources prior to this appointment, wife will make patient aware of appointment and invite him to participate if desired.  Per wife, children are significantly stressed due to anxiety in family re cancer, prognosis and treatment.  Daughter has received help from [facility], not currently engaged in services but hopes to return.  [name], LCSW Clinical Social Worker Phone:  [phone]

## 2017-07-17 ENCOUNTER — Ambulatory Visit: Payer: BLUE CROSS/BLUE SHIELD | Admitting: Hematology

## 2017-07-17 ENCOUNTER — Inpatient Hospital Stay: Payer: BLUE CROSS/BLUE SHIELD

## 2017-07-17 ENCOUNTER — Encounter: Payer: Self-pay | Admitting: General Practice

## 2017-07-17 VITALS — BP 147/90 | HR 96 | Temp 98.8°F | Resp 17

## 2017-07-17 DIAGNOSIS — R197 Diarrhea, unspecified: Secondary | ICD-10-CM | POA: Diagnosis not present

## 2017-07-17 DIAGNOSIS — C7802 Secondary malignant neoplasm of left lung: Secondary | ICD-10-CM | POA: Diagnosis not present

## 2017-07-17 DIAGNOSIS — Z5112 Encounter for antineoplastic immunotherapy: Secondary | ICD-10-CM | POA: Diagnosis present

## 2017-07-17 DIAGNOSIS — C187 Malignant neoplasm of sigmoid colon: Secondary | ICD-10-CM | POA: Diagnosis present

## 2017-07-17 DIAGNOSIS — C189 Malignant neoplasm of colon, unspecified: Secondary | ICD-10-CM

## 2017-07-17 DIAGNOSIS — R110 Nausea: Secondary | ICD-10-CM | POA: Diagnosis not present

## 2017-07-17 DIAGNOSIS — Z8674 Personal history of sudden cardiac arrest: Secondary | ICD-10-CM | POA: Diagnosis not present

## 2017-07-17 DIAGNOSIS — C7801 Secondary malignant neoplasm of right lung: Secondary | ICD-10-CM | POA: Diagnosis not present

## 2017-07-17 DIAGNOSIS — R030 Elevated blood-pressure reading, without diagnosis of hypertension: Secondary | ICD-10-CM | POA: Diagnosis not present

## 2017-07-17 DIAGNOSIS — Z5111 Encounter for antineoplastic chemotherapy: Secondary | ICD-10-CM | POA: Diagnosis present

## 2017-07-17 DIAGNOSIS — C7800 Secondary malignant neoplasm of unspecified lung: Secondary | ICD-10-CM

## 2017-07-17 DIAGNOSIS — R05 Cough: Secondary | ICD-10-CM | POA: Diagnosis not present

## 2017-07-17 DIAGNOSIS — E669 Obesity, unspecified: Secondary | ICD-10-CM | POA: Diagnosis not present

## 2017-07-17 DIAGNOSIS — G629 Polyneuropathy, unspecified: Secondary | ICD-10-CM | POA: Diagnosis not present

## 2017-07-17 DIAGNOSIS — F419 Anxiety disorder, unspecified: Secondary | ICD-10-CM | POA: Diagnosis not present

## 2017-07-17 DIAGNOSIS — R079 Chest pain, unspecified: Secondary | ICD-10-CM | POA: Diagnosis not present

## 2017-07-17 LAB — CBC WITH DIFFERENTIAL/PLATELET
Basophils Absolute: 0 10*3/uL (ref 0.0–0.1)
Basophils Relative: 1 %
EOS PCT: 5 %
Eosinophils Absolute: 0.3 10*3/uL (ref 0.0–0.5)
HCT: 38.7 % (ref 38.4–49.9)
Hemoglobin: 13.1 g/dL (ref 13.0–17.1)
LYMPHS ABS: 1.2 10*3/uL (ref 0.9–3.3)
LYMPHS PCT: 18 %
MCH: 29 pg (ref 27.2–33.4)
MCHC: 33.8 g/dL (ref 32.0–36.0)
MCV: 85.8 fL (ref 79.3–98.0)
MONO ABS: 0.4 10*3/uL (ref 0.1–0.9)
Monocytes Relative: 7 %
Neutro Abs: 4.5 10*3/uL (ref 1.5–6.5)
Neutrophils Relative %: 69 %
PLATELETS: 229 10*3/uL (ref 140–400)
RBC: 4.51 MIL/uL (ref 4.20–5.82)
RDW: 13.9 % (ref 11.0–15.6)
WBC: 6.4 10*3/uL (ref 4.0–10.3)

## 2017-07-17 LAB — COMPREHENSIVE METABOLIC PANEL
ALT: 18 U/L (ref 0–55)
AST: 19 U/L (ref 5–34)
Albumin: 3.4 g/dL — ABNORMAL LOW (ref 3.5–5.0)
Alkaline Phosphatase: 96 U/L (ref 40–150)
Anion gap: 11 (ref 3–11)
BUN: 9 mg/dL (ref 7–26)
CHLORIDE: 104 mmol/L (ref 98–109)
CO2: 23 mmol/L (ref 22–29)
CREATININE: 0.87 mg/dL (ref 0.70–1.30)
Calcium: 9 mg/dL (ref 8.4–10.4)
Glucose, Bld: 128 mg/dL (ref 70–140)
POTASSIUM: 4 mmol/L (ref 3.5–5.1)
Sodium: 138 mmol/L (ref 136–145)
Total Bilirubin: 0.4 mg/dL (ref 0.2–1.2)
Total Protein: 6.8 g/dL (ref 6.4–8.3)

## 2017-07-17 LAB — MAGNESIUM: Magnesium: 2.3 mg/dL (ref 1.5–2.5)

## 2017-07-17 MED ORDER — HEPARIN SOD (PORK) LOCK FLUSH 100 UNIT/ML IV SOLN
500.0000 [IU] | Freq: Once | INTRAVENOUS | Status: AC | PRN
  Administered 2017-07-17: 500 [IU]
  Filled 2017-07-17: qty 5

## 2017-07-17 MED ORDER — DEXTROSE 5 % IV SOLN
500.0000 mg/m2 | Freq: Once | INTRAVENOUS | Status: AC
  Administered 2017-07-17: 1090 mg via INTRAVENOUS
  Filled 2017-07-17: qty 54.5

## 2017-07-17 MED ORDER — SODIUM CHLORIDE 0.9 % IV SOLN
5.0000 mg/kg | Freq: Once | INTRAVENOUS | Status: AC
  Administered 2017-07-17: 500 mg via INTRAVENOUS
  Filled 2017-07-17: qty 4

## 2017-07-17 MED ORDER — SODIUM CHLORIDE 0.9% FLUSH
10.0000 mL | INTRAVENOUS | Status: DC | PRN
  Administered 2017-07-17: 10 mL
  Filled 2017-07-17: qty 10

## 2017-07-17 MED ORDER — PROCHLORPERAZINE MALEATE 10 MG PO TABS
10.0000 mg | ORAL_TABLET | Freq: Once | ORAL | Status: AC
  Administered 2017-07-17: 10 mg via ORAL

## 2017-07-17 MED ORDER — PROCHLORPERAZINE MALEATE 10 MG PO TABS
ORAL_TABLET | ORAL | Status: AC
  Filled 2017-07-17: qty 1

## 2017-07-17 MED ORDER — FLUOROURACIL CHEMO INJECTION 2.5 GM/50ML
600.0000 mg/m2 | Freq: Once | INTRAVENOUS | Status: AC
  Administered 2017-07-17: 1300 mg via INTRAVENOUS
  Filled 2017-07-17: qty 26

## 2017-07-17 MED ORDER — SODIUM CHLORIDE 0.9 % IV SOLN
Freq: Once | INTRAVENOUS | Status: AC
  Administered 2017-07-17: 08:00:00 via INTRAVENOUS

## 2017-07-17 NOTE — Progress Notes (Signed)
[facility] CSW Progress Note Met w patient in infusion room.  Discussed wife's stress level and concerns re family communication re cancer diagnosis and treatment.  Discussed ways parents can help children manage anxiety re parent's diagnosis.  Patient agreeable to CSW meeting w wife to provide support and resources re cancer.  Patient has discussed his diagnosis and treatment plan w children, feels they have been given appropriate level of information re his cancer.  CSW provided psychoeducation re communication w teenagers about cancer and importance of providing age appropriate information and support as way to increase stability in family system.  Will discuss similar w wife and provide resources as appropriate.  [name], LCSW Clinical Social Worker Phone:  [phone]

## 2017-07-17 NOTE — Patient Instructions (Signed)
[facility] Discharge Instructions for Patients Receiving Chemotherapy  Today you received the following chemotherapy agents 5FU, Leucovorin Avastin  To help prevent nausea and vomiting after your treatment, we encourage you to take your nausea medication as prescribed.   If you develop nausea and vomiting that is not controlled by your nausea medication, call the clinic.   BELOW ARE SYMPTOMS THAT SHOULD BE REPORTED IMMEDIATELY:  *FEVER GREATER THAN 100.5 F  *CHILLS WITH OR WITHOUT FEVER  NAUSEA AND VOMITING THAT IS NOT CONTROLLED WITH YOUR NAUSEA MEDICATION  *UNUSUAL SHORTNESS OF BREATH  *UNUSUAL BRUISING OR BLEEDING  TENDERNESS IN MOUTH AND THROAT WITH OR WITHOUT PRESENCE OF ULCERS  *URINARY PROBLEMS  *BOWEL PROBLEMS  UNUSUAL RASH Items with * indicate a potential emergency and should be followed up as soon as possible.  Feel free to call the clinic should you have any questions or concerns. The clinic phone number is [phone].  Please show the [facility] at check-in to the Emergency Department and triage nurse.  Leucovorin injection What is this medicine? LEUCOVORIN (loo koe VOR in) is used to prevent or treat the harmful effects of some medicines. This medicine is used to treat anemia caused by a low amount of folic acid in the body. It is also used with 5-fluorouracil (5-FU) to treat colon cancer. This medicine may be used for other purposes; ask your health care provider or pharmacist if you have questions. What should I tell my health care provider before I take this medicine? They need to know if you have any of these conditions: -anemia from low levels of vitamin B-12 in the blood -an unusual or allergic reaction to leucovorin, folic acid, other medicines, foods, dyes, or preservatives -pregnant or trying to get pregnant -breast-feeding How should I use this medicine? This medicine is for injection into a muscle or into a vein. It is  given by a health care professional in a hospital or clinic setting. Talk to your pediatrician regarding the use of this medicine in children. Special care may be needed. Overdosage: If you think you have taken too much of this medicine contact a poison control center or emergency room at once. NOTE: This medicine is only for you. Do not share this medicine with others. What if I miss a dose? This does not apply. What may interact with this medicine? -capecitabine -fluorouracil -phenobarbital -phenytoin -primidone -trimethoprim-sulfamethoxazole This list may not describe all possible interactions. Give your health care provider a list of all the medicines, herbs, non-prescription drugs, or dietary supplements you use. Also tell them if you smoke, drink alcohol, or use illegal drugs. Some items may interact with your medicine. What should I watch for while using this medicine? Your condition will be monitored carefully while you are receiving this medicine. This medicine may increase the side effects of 5-fluorouracil, 5-FU. Tell your doctor or health care professional if you have diarrhea or mouth sores that do not get better or that get worse. What side effects may I notice from receiving this medicine? Side effects that you should report to your doctor or health care professional as soon as possible: -allergic reactions like skin rash, itching or hives, swelling of the face, lips, or tongue -breathing problems -fever, infection -mouth sores -unusual bleeding or bruising -unusually weak or tired Side effects that usually do not require medical attention (report to your doctor or health care professional if they continue or are bothersome): -constipation or diarrhea -loss of appetite -nausea,  vomiting This list may not describe all possible side effects. Call your doctor for medical advice about side effects. You may report side effects to FDA at 1-800-FDA-1088. Where should I keep my  medicine? This drug is given in a hospital or clinic and will not be stored at home. NOTE: This sheet is a summary. It may not cover all possible information. If you have questions about this medicine, talk to your doctor, pharmacist, or health care provider.  2018 Elsevier/Gold Standard (2007-12-29 16:50:29)   Fluorouracil, 5-FU injection What is this medicine? FLUOROURACIL, 5-FU (flure oh YOOR a sil) is a chemotherapy drug. It slows the growth of cancer cells. This medicine is used to treat many types of cancer like breast cancer, colon or rectal cancer, pancreatic cancer, and stomach cancer. This medicine may be used for other purposes; ask your health care provider or pharmacist if you have questions. COMMON BRAND NAME(S): Adrucil What should I tell my health care provider before I take this medicine? They need to know if you have any of these conditions: -blood disorders -dihydropyrimidine dehydrogenase (DPD) deficiency -infection (especially a virus infection such as chickenpox, cold sores, or herpes) -kidney disease -liver disease -malnourished, poor nutrition -recent or ongoing radiation therapy -an unusual or allergic reaction to fluorouracil, other chemotherapy, other medicines, foods, dyes, or preservatives -pregnant or trying to get pregnant -breast-feeding How should I use this medicine? This drug is given as an infusion or injection into a vein. It is administered in a hospital or clinic by a specially trained health care professional. Talk to your pediatrician regarding the use of this medicine in children. Special care may be needed. Overdosage: If you think you have taken too much of this medicine contact a poison control center or emergency room at once. NOTE: This medicine is only for you. Do not share this medicine with others. What if I miss a dose? It is important not to miss your dose. Call your doctor or health care professional if you are unable to keep an  appointment. What may interact with this medicine? -allopurinol -cimetidine -dapsone -digoxin -hydroxyurea -leucovorin -levamisole -medicines for seizures like ethotoin, fosphenytoin, phenytoin -medicines to increase blood counts like filgrastim, pegfilgrastim, sargramostim -medicines that treat or prevent blood clots like warfarin, enoxaparin, and dalteparin -methotrexate -metronidazole -pyrimethamine -some other chemotherapy drugs like busulfan, cisplatin, estramustine, vinblastine -trimethoprim -trimetrexate -vaccines Talk to your doctor or health care professional before taking any of these medicines: -acetaminophen -aspirin -ibuprofen -ketoprofen -naproxen This list may not describe all possible interactions. Give your health care provider a list of all the medicines, herbs, non-prescription drugs, or dietary supplements you use. Also tell them if you smoke, drink alcohol, or use illegal drugs. Some items may interact with your medicine. What should I watch for while using this medicine? Visit your doctor for checks on your progress. This drug may make you feel generally unwell. This is not uncommon, as chemotherapy can affect healthy cells as well as cancer cells. Report any side effects. Continue your course of treatment even though you feel ill unless your doctor tells you to stop. In some cases, you may be given additional medicines to help with side effects. Follow all directions for their use. Call your doctor or health care professional for advice if you get a fever, chills or sore throat, or other symptoms of a cold or flu. Do not treat yourself. This drug decreases your body's ability to fight infections. Try to avoid being around people who are sick.  This medicine may increase your risk to bruise or bleed. Call your doctor or health care professional if you notice any unusual bleeding. Be careful brushing and flossing your teeth or using a toothpick because you may get  an infection or bleed more easily. If you have any dental work done, tell your dentist you are receiving this medicine. Avoid taking products that contain aspirin, acetaminophen, ibuprofen, naproxen, or ketoprofen unless instructed by your doctor. These medicines may hide a fever. Do not become pregnant while taking this medicine. Women should inform their doctor if they wish to become pregnant or think they might be pregnant. There is a potential for serious side effects to an unborn child. Talk to your health care professional or pharmacist for more information. Do not breast-feed an infant while taking this medicine. Men should inform their doctor if they wish to father a child. This medicine may lower sperm counts. Do not treat diarrhea with over the counter products. Contact your doctor if you have diarrhea that lasts more than 2 days or if it is severe and watery. This medicine can make you more sensitive to the sun. Keep out of the sun. If you cannot avoid being in the sun, wear protective clothing and use sunscreen. Do not use sun lamps or tanning beds/booths. What side effects may I notice from receiving this medicine? Side effects that you should report to your doctor or health care professional as soon as possible: -allergic reactions like skin rash, itching or hives, swelling of the face, lips, or tongue -low blood counts - this medicine may decrease the number of white blood cells, red blood cells and platelets. You may be at increased risk for infections and bleeding. -signs of infection - fever or chills, cough, sore throat, pain or difficulty passing urine -signs of decreased platelets or bleeding - bruising, pinpoint red spots on the skin, black, tarry stools, blood in the urine -signs of decreased red blood cells - unusually weak or tired, fainting spells, lightheadedness -breathing problems -changes in vision -chest pain -mouth sores -nausea and vomiting -pain, swelling, redness  at site where injected -pain, tingling, numbness in the hands or feet -redness, swelling, or sores on hands or feet -stomach pain -unusual bleeding Side effects that usually do not require medical attention (report to your doctor or health care professional if they continue or are bothersome): -changes in finger or toe nails -diarrhea -dry or itchy skin -hair loss -headache -loss of appetite -sensitivity of eyes to the light -stomach upset -unusually teary eyes This list may not describe all possible side effects. Call your doctor for medical advice about side effects. You may report side effects to FDA at 1-800-FDA-1088. Where should I keep my medicine? This drug is given in a hospital or clinic and will not be stored at home. NOTE: This sheet is a summary. It may not cover all possible information. If you have questions about this medicine, talk to your doctor, pharmacist, or health care provider.  2018 Elsevier/Gold Standard (2007-10-28 13:53:16)   Bevacizumab injection (Avastin) What is this medicine? BEVACIZUMAB (be va SIZ yoo mab) is a monoclonal antibody. It is used to treat many types of cancer. This medicine may be used for other purposes; ask your health care provider or pharmacist if you have questions. COMMON BRAND NAME(S): Avastin What should I tell my health care provider before I take this medicine? They need to know if you have any of these conditions: -diabetes -heart disease -high blood pressure -  history of coughing up blood -prior anthracycline chemotherapy (e.g., doxorubicin, daunorubicin, epirubicin) -recent or ongoing radiation therapy -recent or planning to have surgery -stroke -an unusual or allergic reaction to bevacizumab, hamster proteins, mouse proteins, other medicines, foods, dyes, or preservatives -pregnant or trying to get pregnant -breast-feeding How should I use this medicine? This medicine is for infusion into a vein. It is given by a  health care professional in a hospital or clinic setting. Talk to your pediatrician regarding the use of this medicine in children. Special care may be needed. Overdosage: If you think you have taken too much of this medicine contact a poison control center or emergency room at once. NOTE: This medicine is only for you. Do not share this medicine with others. What if I miss a dose? It is important not to miss your dose. Call your doctor or health care professional if you are unable to keep an appointment. What may interact with this medicine? Interactions are not expected. This list may not describe all possible interactions. Give your health care provider a list of all the medicines, herbs, non-prescription drugs, or dietary supplements you use. Also tell them if you smoke, drink alcohol, or use illegal drugs. Some items may interact with your medicine. What should I watch for while using this medicine? Your condition will be monitored carefully while you are receiving this medicine. You will need important blood work and urine testing done while you are taking this medicine. This medicine may increase your risk to bruise or bleed. Call your doctor or health care professional if you notice any unusual bleeding. This medicine should be started at least 28 days following major surgery and the site of the surgery should be totally healed. Check with your doctor before scheduling dental work or surgery while you are receiving this treatment. Talk to your doctor if you have recently had surgery or if you have a wound that has not healed. Do not become pregnant while taking this medicine or for 6 months after stopping it. Women should inform their doctor if they wish to become pregnant or think they might be pregnant. There is a potential for serious side effects to an unborn child. Talk to your health care professional or pharmacist for more information. Do not breast-feed an infant while taking this  medicine and for 6 months after the last dose. This medicine has caused ovarian failure in some women. This medicine may interfere with the ability to have a child. You should talk to your doctor or health care professional if you are concerned about your fertility. What side effects may I notice from receiving this medicine? Side effects that you should report to your doctor or health care professional as soon as possible: -allergic reactions like skin rash, itching or hives, swelling of the face, lips, or tongue -chest pain or chest tightness -chills -coughing up blood -high fever -seizures -severe constipation -signs and symptoms of bleeding such as bloody or black, tarry stools; red or dark-brown urine; spitting up blood or brown material that looks like coffee grounds; red spots on the skin; unusual bruising or bleeding from the eye, gums, or nose -signs and symptoms of a blood clot such as breathing problems; chest pain; severe, sudden headache; pain, swelling, warmth in the leg -signs and symptoms of a stroke like changes in vision; confusion; trouble speaking or understanding; severe headaches; sudden numbness or weakness of the face, arm or leg; trouble walking; dizziness; loss of balance or coordination -stomach pain -  sweating -swelling of legs or ankles -vomiting -weight gain Side effects that usually do not require medical attention (report to your doctor or health care professional if they continue or are bothersome): -back pain -changes in taste -decreased appetite -dry skin -nausea -tiredness This list may not describe all possible side effects. Call your doctor for medical advice about side effects. You may report side effects to FDA at 1-800-FDA-1088. Where should I keep my medicine? This drug is given in a hospital or clinic and will not be stored at home. NOTE: This sheet is a summary. It may not cover all possible information. If you have questions about this  medicine, talk to your doctor, pharmacist, or health care provider.  2018 Elsevier/Gold Standard (2016-06-21 14:33:29)

## 2017-07-18 ENCOUNTER — Encounter: Payer: Self-pay | Admitting: General Practice

## 2017-07-18 NOTE — Progress Notes (Signed)
[facility] CSW Progress Note  Met w wife, Daymian Lill, to discuss caregiver stress and impact on family of patient's diagnosis and treatment.  Explored options for positive self care and problem solving related to caregiver stress.  Provided psychoeducation on impact of cancer on families.  Provided resource booklet for teenagers w parent diagnosed w cancer.  Youngest child is linked w [facility], will be seen for therapy at this provider related to distress re father's diagnosis and treatment.    Social work interventions  - supportive counseling - motivational interviewing - resource and referral.    Plan:  Return in one week for continued problem solving re family distress and communication.  [name], LCSW Clinical Social Worker Phone:  [phone]

## 2017-07-23 NOTE — Progress Notes (Signed)
[place]  Telephone:([phone]) [phone] Fax:([phone]) [phone]  Clinic Follow up Note   Patient Care Team: Lujean Amel, MD as PCP - General (Family Medicine) Wilford Corner, MD as Consulting Physician (Gastroenterology) Jackolyn Confer, MD as Consulting Physician (General Surgery) Truitt Merle, MD as Consulting Physician (Hematology) Dixie Dials, MD as Consulting Physician (Cardiology) 07/24/2017  CHIEF COMPLAINT: Follow up metastatic colon cancer  SUMMARY OF ONCOLOGIC HISTORY: Oncology History   T2, Colon cancer metastasized to lung [facility])   Staging form: Colon and Rectum, AJCC 7th Edition     Clinical stage from 03/30/2015: Stage Unknown ([place], N1, M1) - Unsigned       Colon cancer metastasized to lung [facility]) s/p laparoscopic assisted sigmoid colectomy 11/02/15   03/30/2015 Miscellaneous    Foundation one genomic testing showed TP53 mutation, MSI stable, low tumor mutation burden. Negative for K-ras, NRAS and BRAF      03/30/2015 Initial Biopsy    Sigmoid mass biopsy showed invasive adenocarcinoma. Cecal colon polyps showed tubular adenoma.      03/30/2015 Initial Diagnosis    Colon cancer      03/30/2015 Procedure    colonoscopy by Dr. Michail Sermon showed a fungating, infiltrative and ulcerated nonobstructing large mass in the sigmoid colon and at 20 cm proximal to the anus. The mass was partially circumferential no bleeding. A 10 mm polyps in the cecum was removed.      04/03/2015 Imaging    CT chest, abdomen and pelvis with contrast showed nodular masslike area of clinical worsening at rectosigmoid junction, tiny pericolonic lymph nodes, bilateral pulmonary nodules measuring about 1 cm.      04/12/2015 PET scan    Hypermetabolic colonic mass near rectosigmoid junction, tiny subcentimeter paracolonic lymph nodes. Bilateral pulmonary metastasis.      04/19/2015 Procedure    CT-guided lung nodule biopsy attempted, unsuccessful.      04/27/2015 - 09/14/2015  Chemotherapy    Oxaliplatin 130 mg/m on day 1, Capecitabine 2376m q12hr, 2 weeks on and one week off (only received 7 days for first cycle), oxaliplatin held on cycle 5 and dose reduced to 1071mm2, capecitabine reduced to 200066m12h D1-14, stopped per pt's request.       06/29/2015 - 10/19/2015 Chemotherapy     Panitumumab every 2 weeks, some cycles were postponed due to pt's request, stoppe per pt's request       09/18/2015 Imaging    Pulmonary nodules are less hypermetabolic, stable size. Stable hypermetabolic portal hepatis and abdominal peritoneal ligament lymph nodes. No other new lesions.       11/02/2015 Surgery    sigmoid colon segmental resection       11/02/2015 Pathology Results    Sigmoid colon segmental resection showed adenocarcinoma, grade 3,  T2, 3 out of 13 lymph nodes were positive, surgical margins were negative. LVI(-), perineural invasion negative      11/13/2015 Imaging    CT chest, abdomen and pelvis with contrast showed postsurgical changes, mild progression of pulmonary metastasis, measuring up to 15 mm in the right lower lobe.      11/29/2015 - 12/05/2015 Radiation Therapy    SBRT to 4 lung lesions in 3 sessions       04/25/2016 - 08/05/2016 Chemotherapy    FOLFIRI every 2 weeks, and Avastin added from cycle 3, 5-FU held since cycle 2 due to poor tolerance. Chemo was stopped after 3 months due to overall poor tolerance and pt's request to stop.       06/27/2016 Imaging  CT CAP 06/27/16 IMPRESSION: Status post partial left hemicolectomy. Progressive wall thickening involving the colon near the suture line, worrisome for residual tumor. Adjacent 7 mm short axis pericolonic lymph node, possibly reflecting a nodal metastasis. Radiation changes in the posterior right upper lobe and superior segment right lower lobe. Progressive pulmonary metastases bilaterally, measuring up to 13 mm, as above.      09/17/2016 Imaging    CT A/P/C w contrast  IMPRESSION: 1.  Multifocal pulmonary nodules are not significantly changed in size compared with the previous exam. 2. Stable appearance of radiation changes within the right midlung. 3. Stable to scratch set improved appearance of wall thickening involving the colon at the level of the suture line.       11/11/2016 Imaging    CT Chest WO Contrast 11/11/16: IMPRESSION: 1. Minimal enlargement of some pulmonary metastases. 2. Coronary artery calcification. 3. Hepatic steatosis.       02/10/2017 Imaging    CT CAP W Contrast 02/10/17 IMPRESSION: 1. Progressive metastatic disease to the thorax as demonstrated by increased size of numerous previously noted pulmonary nodules, what appears to be lymphangitic spread of disease developing in the right upper lobe and superior segment of the right lower lobe, and new 1.5 cm short axis prevascular lymph node. 2. No definite findings of metastatic disease in the abdomen or pelvis. 3. Multiple tiny nonobstructive calculi in the collecting systems of the kidneys bilaterally measuring 2-3 mm in size. No ureteral stones or findings of urinary tract obstruction are noted at this time. 4. Hepatic steatosis. 5. Aortic atherosclerosis, in addition to left anterior descending coronary artery disease. Please note that although the presence of coronary artery calcium documents the presence of coronary artery disease, the severity of this disease and any potential stenosis cannot be assessed on this non-gated CT examination. Assessment for potential risk factor modification, dietary therapy or pharmacologic therapy may be warranted, if clinically indicated. 6. There are calcifications of the aortic valve. Echocardiographic correlation for evaluation of potential valvular dysfunction may be warranted if clinically indicated.       02/27/2017 -  Chemotherapy    Third-line chemotherapy Xeloda for 2 weeks on and 1 week off with Oxaliplatin every 3 weeks and Panitumumab every  2 weeks on a different day. He developed anaphylactic reaction to oxaliplatin, coded, oxaliplatin was subsequently stopped. He did not to tolerate Xeloda, plan to change to 5-FU bolus weekly, Held chemo since 03/20/17.   Restarted panitumumab every 2 weeks on 05/01/17        06/11/2017 Imaging    CT CAP W Contrast 06/11/17  IMPRESSION: 1. Continued marked interval progression of pulmonary metastatic disease with mediastinal and left hilar metastases evident. 2. Interval development of a 4.3 cm metastatic lesion in the dome of the liver. 3. Stable nonobstructing nephrolithiasis.     CURRENT TREATMENT:  restarted panitumumab on 05/01/17 for every 2 weeks ended on 06/13/17 due to disease progression. Switch to leucovorin/5-fu  with avastin and leucovorin bolus starting 06/26/17  INTERVAL HISTORY: Bill Wright returns for follow-up as scheduled prior to next cycle of chemotherapy.  He is tolerating this regimen well.  Continues to note intermittent dry cough with occasional shortness of breath, resolves quickly with rest.  He can walk a flight of stairs without stopping for break.  He has sharp pain in right lower chest when he coughs.  This pain has been present since cardiac arrest episode.  Mildly anxious but not depressed.  BP is elevated; he stopped  cardizem and metoprolol 3-4 weeks ago due to he felt his heart was in normal rhythm and meds were making him constipated. Denies headache, vision change. Not taking new or OTC medications. He wants to go back to work eventually.  Appetite is low but drinking well; denies fever, chills, fatigue, nausea, diarrhea, bleeding, swelling, numbness or tingling.   REVIEW OF SYSTEMS:   Constitutional: Denies fatigue, fevers, chills (+) abnormal weight loss (+) low appetite  Eyes: Denies blurriness of vision Ears, nose, mouth, throat, and face: Denies mucositis or sore throat Respiratory: Denies wheezes (+) dry cough (+) intermittent dyspnea Cardiovascular:  Denies palpitation, chest discomfort or lower extremity swelling (+) sharpe right chest pain with cough  Gastrointestinal:  Denies nausea, vomiting, constipation, diarrhea, heartburn or change in bowel habits  Skin: Denies abnormal skin rashes Lymphatics: Denies new lymphadenopathy or easy bruising Neurological:Denies numbness, tingling or new weaknesses. Denies headache  Behavioral/Psych: Mood is stable, no new changes (+) mildly anxious in general but well controlled (+) wakes up often at night All other systems were reviewed with the patient and are negative.  MEDICAL HISTORY:  Past Medical History:  Diagnosis Date  . Anxiety    situational due to cancer diagnosis  . Cancer [facility]) 2017   colon-chemo 09/22/15 now surgery  . Hypercholesteremia   . Kidney calculi     SURGICAL HISTORY: Past Surgical History:  Procedure Laterality Date  . COLONOSCOPY  03/30/15  . IR CV LINE INJECTION  04/09/2017  . IR FLUORO GUIDE PORT INSERTION LEFT  06/26/2017  . IR FLUORO GUIDE PORT INSERTION RIGHT  04/15/2017  . IR REMOVAL TUN ACCESS W/ PORT W/O FL MOD SED  04/15/2017  . IR REMOVAL TUN ACCESS W/ PORT W/O FL MOD SED  06/26/2017  . IR US GUIDE VASC ACCESS LEFT  06/26/2017  . IR US GUIDE VASC ACCESS RIGHT  04/15/2017  . LAPAROSCOPIC PARTIAL COLECTOMY N/A 11/02/2015   Procedure: LAPAROSCOPIC ASSISTED SIGMOID COLECTOMY;  Surgeon: Jackolyn Confer, MD;  Location: [facility];  Service: General;  Laterality: N/A;  . LEFT HEART CATH AND CORONARY ANGIOGRAPHY N/A 03/21/2017   Procedure: LEFT HEART CATH AND CORONARY ANGIOGRAPHY;  Surgeon: Dixie Dials, MD;  Location: [place] CV LAB;  Service: Cardiovascular;  Laterality: N/A;  . PORTACATH PLACEMENT N/A 05/08/2016   Procedure: INSERTION PORT-A-CATH;  Surgeon: Jackolyn Confer, MD;  Location: [facility];  Service: General;  Laterality: N/A;    I have reviewed the social history and family history with the patient and they are unchanged from previous note.  ALLERGIES:   is allergic to oxaliplatin.  MEDICATIONS:  Current Outpatient Medications  Medication Sig Dispense Refill  . albuterol (VENTOLIN HFA) 108 (90 Base) MCG/ACT inhaler Inhale 1 puff into the lungs daily as needed for wheezing or shortness of breath. 10 PUFFS DAILY FOR WHEEZING AND SHORTNESS OF BREATH    . clindamycin (CLINDAGEL) 1 % gel Apply 2 (two) times daily topically. 30 g 0  . Fluticasone-Salmeterol (ADVAIR DISKUS) 250-50 MCG/DOSE AEPB Inhale 1 puff into the lungs 2 (two) times daily. (Patient taking differently: Inhale 1 puff into the lungs daily. ) 180 each 1  . HYDROcodone-acetaminophen (NORCO/VICODIN) 5-325 MG tablet Take 1 tablet by mouth every 6 (six) hours as needed for moderate pain or severe pain. 30 tablet 0  . Hydrocodone-Homatropine 5-1.5 MG TABS Take 1 tablet by mouth every 6 (six) hours as needed. 60 each 0  . mometasone (ELOCON) 0.1 % cream Apply 1 application topically daily. 45 g 2  .  diltiazem (CARDIZEM) 30 MG tablet Take 30 mg by mouth 3 (three) times daily.    Marland Kitchen LORazepam (ATIVAN) 0.5 MG tablet Take 1 tablet (0.5 mg total) by mouth every 6 (six) hours as needed (Nausea or vomiting). 30 tablet 0  . metoprolol tartrate (LOPRESSOR) 25 MG tablet Take 1 tablet (25 mg total) by mouth 2 (two) times daily. 60 tablet 3  . ondansetron (ZOFRAN) 8 MG tablet Take 1 tablet (8 mg total) by mouth 2 (two) times daily as needed (Nausea or vomiting). (Patient not taking: Reported on 07/24/2017) 30 tablet 1  . prochlorperazine (COMPAZINE) 10 MG tablet Take 1 tablet (10 mg total) by mouth every 6 (six) hours as needed (Nausea or vomiting). (Patient not taking: Reported on 07/24/2017) 30 tablet 1   No current facility-administered medications for this visit.    Facility-Administered Medications Ordered in Other Visits  Medication Dose Route Frequency Provider Last Rate Last Dose  . fluorouracil (ADRUCIL) chemo injection 1,300 mg  600 mg/m2 (Treatment Plan Recorded) Intravenous Once Truitt Merle, MD       . heparin lock flush 100 unit/mL  500 Units Intracatheter Once PRN Truitt Merle, MD      . leucovorin 1,090 mg in dextrose 5 % 250 mL infusion  500 mg/m2 (Treatment Plan Recorded) Intravenous Once Truitt Merle, MD 152 mL/hr at 07/24/17 1103 1,090 mg at 07/24/17 1103  . sodium chloride flush (NS) 0.9 % injection 10 mL  10 mL Intracatheter PRN Truitt Merle, MD        PHYSICAL EXAMINATION: ECOG PERFORMANCE STATUS: 1 - Symptomatic but completely ambulatory  Vitals:   07/24/17 0845 07/24/17 0919  BP: (!) 137/97 (!) 146/99  Pulse: (!) 106   Resp: 18   Temp: 98.3 F (36.8 C)   SpO2: 99%    Filed Weights   07/24/17 0845  Weight: 211 lb 11.2 oz (96 kg)    GENERAL:alert, no distress and comfortable SKIN: skin color, texture, turgor are normal, no rashes or significant lesions EYES: normal, Conjunctiva are pink and non-injected, sclera clear OROPHARYNX:no exudate, no erythema and lips, buccal mucosa, and tongue normal  NECK: supple, thyroid normal size, non-tender, without nodularity LYMPH:  no palpable cervical, supraclavicular, or axillary lymphadenopathy  LUNGS: clear to auscultation bilaterally with normal breathing effort HEART: regular rate & rhythm and no murmurs and no lower extremity edema ABDOMEN:abdomen soft, non-tender and normal bowel sounds. Midline incision well healed  Musculoskeletal:no cyanosis of digits and no clubbing  NEURO: alert & oriented x 3 with fluent speech, no focal motor/sensory deficits PAC without erythema   LABORATORY DATA:  I have reviewed the data as listed CBC Latest Ref Rng & Units 07/24/2017 07/17/2017 07/10/2017  WBC 4.0 - 10.3 K/uL 6.0 6.4 8.2  Hemoglobin 13.0 - 17.1 g/dL 13.4 13.1 13.5  Hematocrit 38.4 - 49.9 % 40.1 38.7 40.7  Platelets 140 - 400 K/uL 204 229 260     CMP Latest Ref Rng & Units 07/24/2017 07/17/2017 07/10/2017  Glucose 70 - 140 mg/dL 124 128 128  BUN 7 - 26 mg/dL 11 9 10.9  Creatinine 0.70 - 1.30 mg/dL 0.87 0.87 0.8  Sodium 136 - 145  mmol/L 139 138 136  Potassium 3.5 - 5.1 mmol/L 4.3 4.0 4.2  Chloride 98 - 109 mmol/L 107 104 -  CO2 22 - 29 mmol/L 22 23 26   Calcium 8.4 - 10.4 mg/dL 9.2 9.0 9.1  Total Protein 6.4 - 8.3 g/dL 6.8 6.8 7.1  Total Bilirubin 0.2 - 1.2  mg/dL 0.7 0.4 0.38  Alkaline Phos 40 - 150 U/L 94 96 101  AST 5 - 34 U/L 17 19 17   ALT 0 - 55 U/L 18 18 20    CEA:  04/20/2016: <0.5 06/08/2015: <0.5 09/07/2015: <0.5 11/15/2015: 0.9 02/16/2016: <1.0  04/25/2016: 1.6 05/23/16: <1.00 06/20/2016: 1.05 07/22/2016: <1.00 09/17/16: <1.00 11/14/16: 1.60 02/10/17: 5.94 03/20/2017: 4.92 05/01/17: 13.29 05/30/17: 19.94 06/13/17: 27.88 07/10/17: 41.26    PATHOLOGY REPORT  Diagnosis 11/02/2015 1. Colon, segmental resection for tumor, sigmoid ADENOCARCINOMA OF THE SIGMOID COLON (2.0 CM), GRADE 3 THE TUMOR INVADES MUSCULARIS PROPRIA MARGINS OF RESECTION ARE NEGATIVE METASTATIC ADENOCARCINOMA IN THREE OF THIRTEEN LYMPH NODES (3/13) 2. Colon, resection margin (donut), distal sigmoid BENIGN COLONIC TISSUE Microscopic Comment 1. COLON AND RECTUM (INCLUDING TRANS-ANAL RESECTION): Specimen: Sigmoid Procedure: Segmental resection Tumor site: sigmoid Specimen integrity: Intact Macroscopic intactness of mesorectum: Not applicable: x Complete: NA Near complete: NA Incomplete: NA Cannot be determined (specify): NA Macroscopic tumor perforation: Muscularis Invasive tumor: Maximum size: 2.0 cm Histologic type(s): Adenocarcinoma Histologic grade and differentiation: G3 G1: well differentiated/low grade G2: moderately differentiated/low grade G3: poorly differentiated/high grade G4: undifferentiated/high grade Type of polyp in which invasive carcinoma arose: Tubular adenoma Microscopic extension of invasive tumor: Muscularis propria Lymph-Vascular invasion: Negative Peri-neural invasion: Negative Tumor deposit(s) (discontinuous extramural extension): Negative Resection margins: Proximal margin: Negative Distal  margin: Negative Circumferential (radial) (posterior ascending, posterior descending; lateral and posterior mid-rectum; and entire lower 1/3 rectum):Negative Mesenteric margin (sigmoid and transverse): Negative Distance closest margin (if all above margins negative): 3.5 cm Trans-anal resection margins only: Deep margin: NA Mucosal Margin: NA Distance closest mucosal margin (if negative): NA Treatment effect (neo-adjuvant therapy): Partial Additional polyp(s): None Non-neoplastic findings: unremarkable Lymph nodes: number examined 13; number positive: 3 Pathologic Staging: T2, N1b, M1a       RADIOGRAPHIC STUDIES: I have personally reviewed the radiological images as listed and agreed with the findings in the report. No results found.   ASSESSMENT & PLAN: 57 y.o. male, without significant past medical history except kidney stone, presented with intermittent bloody stool for 2 years, and colonoscopy showed a large sigmoid colon mass, CT scan showed multiple (at least 4) nodules in bilateral lungs, measuring about 1 cm.  1. Sigmoid colon adenocarcinoma, pT2N1bM1, stage IV with lung mets, KRA/NRAS wild type, MSI-stable 2. Cardiac arrest 03/20/17 3. Dry cough and dyspnea 4. Obesity 5. Anxiety 6. Left leg neuropathy 7. Goal of care discussion 8. Intermittent chest pain 9. Elevated BP 10. Emotional support   Bill Wright appears stable today, he is tolerating weekly leucovorin/5FU bolus and q2 week avastin well overall. Dry cough and dyspnea are stable. No neuropathy. BP is markedly elevated today, has been well controlled otherwise; denies pain, acute anxiety; not taking cold or OTC meds. Has been off diltiazem and metoprolol for 3-4 weeks, he discontinued himself because he felt his heart was in rhythm and meds were making him constipated. Elevated BP could be related to avastin, s/p 2 cycles last given 07/17/17. He has had elevated BP early 2018 when he was previously on avastin. I  reached out to his cardiologist for input but have not heard back. I suggest he restart meds and f/u with cardiology. Will monitor his BP closely. We reviewed s/sx of hypertensive crisis that would warrant Ed visit. Weight is down 5 lbs, appetite is low. He is mildly anxious in general but not depressed. Sleep is fair, he wakes up often during the night. I discussed Remeron to improve appetite, mood,  and sleep but he declines.   For ongoing social support he has met with our social worker [name]. I encouraged him to discuss his condition with his family. He noted his 9 year old daughter is mature and smart but also anxious lately. I encouraged him to find a way to discuss his situation with her. She had counseling in the past and he is going to restart again soon.   Otherwise he is doing well, CBC and Cmet unremarkable. CEA slightly increased on 07/10/17 up to 41, will repeat in 2 weeks. Continue weekly leucovorin/5FU bolus and q2 week avastin. Will obtain restaging scans in approx 2 months. Return in 1 week for next treatment, f/u with Dr. Burr Medico in 2 weeks.   PLAN -Labs and VS reviewed, OK to proceed with weekly leucovorin/5FU bolus today -Return in 1 week for weekly leucovorin/5FU bolus and q2 week Avastin  -Lab and f/u with Dr. Burr Medico in 2 weeks -Restart diltiazem and metoprolol, f/u with cardiology   All questions were answered. The patient knows to call the clinic with any problems, questions or concerns. No barriers to learning was detected.   Alla Feeling, NP 07/24/17

## 2017-07-24 ENCOUNTER — Other Ambulatory Visit: Payer: BLUE CROSS/BLUE SHIELD

## 2017-07-24 ENCOUNTER — Inpatient Hospital Stay: Payer: BLUE CROSS/BLUE SHIELD

## 2017-07-24 ENCOUNTER — Inpatient Hospital Stay (HOSPITAL_BASED_OUTPATIENT_CLINIC_OR_DEPARTMENT_OTHER): Payer: BLUE CROSS/BLUE SHIELD | Admitting: Nurse Practitioner

## 2017-07-24 ENCOUNTER — Encounter: Payer: Self-pay | Admitting: Nurse Practitioner

## 2017-07-24 ENCOUNTER — Ambulatory Visit: Payer: BLUE CROSS/BLUE SHIELD

## 2017-07-24 VITALS — BP 145/100 | HR 87

## 2017-07-24 VITALS — BP 146/99 | HR 106 | Temp 98.3°F | Resp 18 | Ht 66.0 in | Wt 211.7 lb

## 2017-07-24 DIAGNOSIS — Z8674 Personal history of sudden cardiac arrest: Secondary | ICD-10-CM

## 2017-07-24 DIAGNOSIS — C189 Malignant neoplasm of colon, unspecified: Secondary | ICD-10-CM

## 2017-07-24 DIAGNOSIS — C7800 Secondary malignant neoplasm of unspecified lung: Principal | ICD-10-CM

## 2017-07-24 DIAGNOSIS — C7802 Secondary malignant neoplasm of left lung: Secondary | ICD-10-CM

## 2017-07-24 DIAGNOSIS — G629 Polyneuropathy, unspecified: Secondary | ICD-10-CM

## 2017-07-24 DIAGNOSIS — R079 Chest pain, unspecified: Secondary | ICD-10-CM | POA: Diagnosis not present

## 2017-07-24 DIAGNOSIS — R05 Cough: Secondary | ICD-10-CM

## 2017-07-24 DIAGNOSIS — Z7189 Other specified counseling: Secondary | ICD-10-CM

## 2017-07-24 DIAGNOSIS — C187 Malignant neoplasm of sigmoid colon: Secondary | ICD-10-CM

## 2017-07-24 DIAGNOSIS — F419 Anxiety disorder, unspecified: Secondary | ICD-10-CM | POA: Diagnosis not present

## 2017-07-24 DIAGNOSIS — E669 Obesity, unspecified: Secondary | ICD-10-CM | POA: Diagnosis not present

## 2017-07-24 DIAGNOSIS — C7801 Secondary malignant neoplasm of right lung: Secondary | ICD-10-CM

## 2017-07-24 DIAGNOSIS — R030 Elevated blood-pressure reading, without diagnosis of hypertension: Secondary | ICD-10-CM | POA: Diagnosis not present

## 2017-07-24 DIAGNOSIS — Z5111 Encounter for antineoplastic chemotherapy: Secondary | ICD-10-CM | POA: Diagnosis not present

## 2017-07-24 LAB — CBC WITH DIFFERENTIAL/PLATELET
Basophils Absolute: 0 10*3/uL (ref 0.0–0.1)
Basophils Relative: 1 %
EOS ABS: 0.3 10*3/uL (ref 0.0–0.5)
EOS PCT: 5 %
HCT: 40.1 % (ref 38.4–49.9)
HEMOGLOBIN: 13.4 g/dL (ref 13.0–17.1)
LYMPHS ABS: 1 10*3/uL (ref 0.9–3.3)
LYMPHS PCT: 16 %
MCH: 28.6 pg (ref 27.2–33.4)
MCHC: 33.4 g/dL (ref 32.0–36.0)
MCV: 85.6 fL (ref 79.3–98.0)
MONOS PCT: 11 %
Monocytes Absolute: 0.6 10*3/uL (ref 0.1–0.9)
Neutro Abs: 4.1 10*3/uL (ref 1.5–6.5)
Neutrophils Relative %: 67 %
PLATELETS: 204 10*3/uL (ref 140–400)
RBC: 4.69 MIL/uL (ref 4.20–5.82)
RDW: 14.3 % (ref 11.0–15.6)
WBC: 6 10*3/uL (ref 4.0–10.3)

## 2017-07-24 LAB — COMPREHENSIVE METABOLIC PANEL
ALBUMIN: 3.6 g/dL (ref 3.5–5.0)
ALT: 18 U/L (ref 0–55)
AST: 17 U/L (ref 5–34)
Alkaline Phosphatase: 94 U/L (ref 40–150)
Anion gap: 10 (ref 3–11)
BUN: 11 mg/dL (ref 7–26)
CHLORIDE: 107 mmol/L (ref 98–109)
CO2: 22 mmol/L (ref 22–29)
CREATININE: 0.87 mg/dL (ref 0.70–1.30)
Calcium: 9.2 mg/dL (ref 8.4–10.4)
GFR calc Af Amer: >60 mL/min (ref >60)
GFR calc non Af Amer: >60 mL/min (ref >60)
Glucose, Bld: 124 mg/dL (ref 70–140)
Potassium: 4.3 mmol/L (ref 3.5–5.1)
SODIUM: 139 mmol/L (ref 136–145)
Total Bilirubin: 0.7 mg/dL (ref 0.2–1.2)
Total Protein: 6.8 g/dL (ref 6.4–8.3)

## 2017-07-24 MED ORDER — PROCHLORPERAZINE MALEATE 10 MG PO TABS
ORAL_TABLET | ORAL | Status: AC
  Filled 2017-07-24: qty 1

## 2017-07-24 MED ORDER — PROCHLORPERAZINE MALEATE 10 MG PO TABS
10.0000 mg | ORAL_TABLET | Freq: Once | ORAL | Status: AC
  Administered 2017-07-24: 10 mg via ORAL

## 2017-07-24 MED ORDER — SODIUM CHLORIDE 0.9 % IV SOLN
Freq: Once | INTRAVENOUS | Status: AC
  Administered 2017-07-24: 10:00:00 via INTRAVENOUS

## 2017-07-24 MED ORDER — HEPARIN SOD (PORK) LOCK FLUSH 100 UNIT/ML IV SOLN
500.0000 [IU] | Freq: Once | INTRAVENOUS | Status: AC | PRN
  Administered 2017-07-24: 500 [IU]
  Filled 2017-07-24: qty 5

## 2017-07-24 MED ORDER — SODIUM CHLORIDE 0.9% FLUSH
10.0000 mL | INTRAVENOUS | Status: DC | PRN
  Administered 2017-07-24: 10 mL
  Filled 2017-07-24: qty 10

## 2017-07-24 MED ORDER — FLUOROURACIL CHEMO INJECTION 2.5 GM/50ML
600.0000 mg/m2 | Freq: Once | INTRAVENOUS | Status: AC
  Administered 2017-07-24: 1300 mg via INTRAVENOUS
  Filled 2017-07-24: qty 26

## 2017-07-24 MED ORDER — LEUCOVORIN CALCIUM INJECTION 350 MG
500.0000 mg/m2 | Freq: Once | INTRAVENOUS | Status: AC
  Administered 2017-07-24: 1090 mg via INTRAVENOUS
  Filled 2017-07-24: qty 54.5

## 2017-07-24 NOTE — Patient Instructions (Signed)
[facility] Discharge Instructions for Patients Receiving Chemotherapy  Today you received the following chemotherapy agents Leucovorin, Flurouracil  To help prevent nausea and vomiting after your treatment, we encourage you to take your nausea medication as directed  If you develop nausea and vomiting that is not controlled by your nausea medication, call the clinic.   BELOW ARE SYMPTOMS THAT SHOULD BE REPORTED IMMEDIATELY:  *FEVER GREATER THAN 100.5 F  *CHILLS WITH OR WITHOUT FEVER  NAUSEA AND VOMITING THAT IS NOT CONTROLLED WITH YOUR NAUSEA MEDICATION  *UNUSUAL SHORTNESS OF BREATH  *UNUSUAL BRUISING OR BLEEDING  TENDERNESS IN MOUTH AND THROAT WITH OR WITHOUT PRESENCE OF ULCERS  *URINARY PROBLEMS  *BOWEL PROBLEMS  UNUSUAL RASH Items with * indicate a potential emergency and should be followed up as soon as possible.  Feel free to call the clinic should you have any questions or concerns. The clinic phone number is [phone].  Please show the [place] at check-in to the Emergency Department and triage nurse.

## 2017-07-24 NOTE — Progress Notes (Signed)
Cira Rue, NP made aware of patient's blood pressure. Gives okay to treat with Leucovorin and Flurouracil; no Avastin.

## 2017-07-25 ENCOUNTER — Encounter: Payer: Self-pay | Admitting: General Practice

## 2017-07-25 ENCOUNTER — Other Ambulatory Visit: Payer: BLUE CROSS/BLUE SHIELD

## 2017-07-25 ENCOUNTER — Ambulatory Visit: Payer: BLUE CROSS/BLUE SHIELD

## 2017-07-25 NOTE — Progress Notes (Signed)
[place] CSW Progress Note  Supportive counseling w wife, Bill Wright.  Discussed ways to manage caregiver stress, ways to find meaning in current situation, practical self care strategies.  Reviewed status of children who have been displaying behavioral dysregulation after patient's cancer diagnosis and treatment, assessed family needs.  56 yo daughter will initiate supportive counseling at 62.  Wife will return next week and continue to work on caregiver stress issues.    [name], LCSW Clinical Social Worker Phone:  [phone]

## 2017-07-31 ENCOUNTER — Inpatient Hospital Stay: Payer: BLUE CROSS/BLUE SHIELD

## 2017-07-31 VITALS — BP 137/87 | HR 86 | Temp 98.5°F | Resp 16

## 2017-07-31 DIAGNOSIS — C7801 Secondary malignant neoplasm of right lung: Secondary | ICD-10-CM

## 2017-07-31 DIAGNOSIS — C189 Malignant neoplasm of colon, unspecified: Secondary | ICD-10-CM

## 2017-07-31 DIAGNOSIS — C7800 Secondary malignant neoplasm of unspecified lung: Principal | ICD-10-CM

## 2017-07-31 DIAGNOSIS — Z5111 Encounter for antineoplastic chemotherapy: Secondary | ICD-10-CM | POA: Diagnosis not present

## 2017-07-31 DIAGNOSIS — Z95828 Presence of other vascular implants and grafts: Secondary | ICD-10-CM

## 2017-07-31 LAB — COMPREHENSIVE METABOLIC PANEL
ALK PHOS: 92 U/L (ref 40–150)
ALT: 62 U/L — AB (ref 0–55)
ANION GAP: 9 (ref 3–11)
AST: 36 U/L — ABNORMAL HIGH (ref 5–34)
Albumin: 3.5 g/dL (ref 3.5–5.0)
BUN: 12 mg/dL (ref 7–26)
CALCIUM: 8.8 mg/dL (ref 8.4–10.4)
CHLORIDE: 105 mmol/L (ref 98–109)
CO2: 25 mmol/L (ref 22–29)
CREATININE: 0.88 mg/dL (ref 0.70–1.30)
Glucose, Bld: 111 mg/dL (ref 70–140)
Potassium: 3.9 mmol/L (ref 3.5–5.1)
SODIUM: 139 mmol/L (ref 136–145)
Total Bilirubin: 0.7 mg/dL (ref 0.2–1.2)
Total Protein: 6.5 g/dL (ref 6.4–8.3)

## 2017-07-31 LAB — CBC WITH DIFFERENTIAL/PLATELET
BASOS PCT: 0 %
Basophils Absolute: 0 10*3/uL (ref 0.0–0.1)
EOS ABS: 0.2 10*3/uL (ref 0.0–0.5)
Eosinophils Relative: 3 %
HCT: 38.7 % (ref 38.4–49.9)
HEMOGLOBIN: 13.1 g/dL (ref 13.0–17.1)
Lymphocytes Relative: 25 %
Lymphs Abs: 1.3 10*3/uL (ref 0.9–3.3)
MCH: 29 pg (ref 27.2–33.4)
MCHC: 33.9 g/dL (ref 32.0–36.0)
MCV: 85.8 fL (ref 79.3–98.0)
Monocytes Absolute: 0.7 10*3/uL (ref 0.1–0.9)
Monocytes Relative: 14 %
Neutro Abs: 3.1 10*3/uL (ref 1.5–6.5)
Neutrophils Relative %: 58 %
Platelets: 185 10*3/uL (ref 140–400)
RBC: 4.51 MIL/uL (ref 4.20–5.82)
RDW: 14.7 % (ref 11.0–15.6)
WBC: 5.4 10*3/uL (ref 4.0–10.3)

## 2017-07-31 LAB — MAGNESIUM: MAGNESIUM: 2.4 mg/dL (ref 1.5–2.5)

## 2017-07-31 MED ORDER — SODIUM CHLORIDE 0.9% FLUSH
10.0000 mL | INTRAVENOUS | Status: DC | PRN
  Administered 2017-07-31: 10 mL via INTRAVENOUS
  Filled 2017-07-31: qty 10

## 2017-07-31 MED ORDER — PROCHLORPERAZINE MALEATE 10 MG PO TABS
ORAL_TABLET | ORAL | Status: AC
  Filled 2017-07-31: qty 1

## 2017-07-31 MED ORDER — SODIUM CHLORIDE 0.9 % IV SOLN
Freq: Once | INTRAVENOUS | Status: AC
  Administered 2017-07-31: 13:00:00 via INTRAVENOUS

## 2017-07-31 MED ORDER — PROCHLORPERAZINE MALEATE 10 MG PO TABS
10.0000 mg | ORAL_TABLET | Freq: Once | ORAL | Status: AC
  Administered 2017-07-31: 10 mg via ORAL

## 2017-07-31 MED ORDER — LEUCOVORIN CALCIUM INJECTION 350 MG
500.0000 mg/m2 | Freq: Once | INTRAVENOUS | Status: AC
  Administered 2017-07-31: 1090 mg via INTRAVENOUS
  Filled 2017-07-31: qty 54.5

## 2017-07-31 MED ORDER — SODIUM CHLORIDE 0.9% FLUSH
10.0000 mL | INTRAVENOUS | Status: DC | PRN
  Administered 2017-07-31: 10 mL
  Filled 2017-07-31: qty 10

## 2017-07-31 MED ORDER — SODIUM CHLORIDE 0.9 % IV SOLN
Freq: Once | INTRAVENOUS | Status: AC
  Administered 2017-07-31: 16:00:00 via INTRAVENOUS
  Filled 2017-07-31: qty 8

## 2017-07-31 MED ORDER — HEPARIN SOD (PORK) LOCK FLUSH 100 UNIT/ML IV SOLN
500.0000 [IU] | Freq: Once | INTRAVENOUS | Status: AC | PRN
  Administered 2017-07-31: 500 [IU]
  Filled 2017-07-31: qty 5

## 2017-07-31 MED ORDER — FLUOROURACIL CHEMO INJECTION 2.5 GM/50ML
600.0000 mg/m2 | Freq: Once | INTRAVENOUS | Status: AC
  Administered 2017-07-31: 1300 mg via INTRAVENOUS
  Filled 2017-07-31: qty 26

## 2017-07-31 MED ORDER — SODIUM CHLORIDE 0.9 % IV SOLN
5.0000 mg/kg | Freq: Once | INTRAVENOUS | Status: AC
  Administered 2017-07-31: 500 mg via INTRAVENOUS
  Filled 2017-07-31: qty 4

## 2017-07-31 NOTE — Progress Notes (Signed)
At completion of leucovorin, patient began experiencing N/V. Notified Dr. Burr Medico. Orders received, repeated, and confirmed. Medicated per MAR.

## 2017-07-31 NOTE — Patient Instructions (Signed)
[facility] Discharge Instructions for Patients Receiving Chemotherapy  Today you received the following chemotherapy agents Leucovorin and 5FU  To help prevent nausea and vomiting after your treatment, we encourage you to take your nausea medication as directed If you develop nausea and vomiting that is not controlled by your nausea medication, call the clinic.   BELOW ARE SYMPTOMS THAT SHOULD BE REPORTED IMMEDIATELY:  *FEVER GREATER THAN 100.5 F  *CHILLS WITH OR WITHOUT FEVER  NAUSEA AND VOMITING THAT IS NOT CONTROLLED WITH YOUR NAUSEA MEDICATION  *UNUSUAL SHORTNESS OF BREATH  *UNUSUAL BRUISING OR BLEEDING  TENDERNESS IN MOUTH AND THROAT WITH OR WITHOUT PRESENCE OF ULCERS  *URINARY PROBLEMS  *BOWEL PROBLEMS  UNUSUAL RASH Items with * indicate a potential emergency and should be followed up as soon as possible.  Feel free to call the clinic should you have any questions or concerns. The clinic phone number is [phone].  Please show the CHEMO ALERT CARD at check-in to the Emergency Department and triage nurse.   

## 2017-08-01 ENCOUNTER — Other Ambulatory Visit: Payer: BLUE CROSS/BLUE SHIELD

## 2017-08-01 ENCOUNTER — Ambulatory Visit: Payer: BLUE CROSS/BLUE SHIELD

## 2017-08-06 NOTE — Progress Notes (Signed)
[place]  [phone] [phone]  Clinic Follow Up Note   Date of Service: 08/07/2017  CHIEF COMPLAINTS:  Follow Up metastatic colon cancer  Oncology History   T2, Colon cancer metastasized to lung [facility])   Staging form: Colon and Rectum, AJCC 7th Edition     Clinical stage from 03/30/2015: Stage Unknown ([place], N1, M1) - Unsigned       Colon cancer metastasized to lung [facility]) s/p laparoscopic assisted sigmoid colectomy 11/02/15   03/30/2015 Miscellaneous    Foundation one genomic testing showed TP53 mutation, MSI stable, low tumor mutation burden. Negative for K-ras, NRAS and BRAF      03/30/2015 Initial Biopsy    Sigmoid mass biopsy showed invasive adenocarcinoma. Cecal colon polyps showed tubular adenoma.      03/30/2015 Initial Diagnosis    Colon cancer      03/30/2015 Procedure    colonoscopy by Dr. Michail Sermon showed a fungating, infiltrative and ulcerated nonobstructing large mass in the sigmoid colon and at 20 cm proximal to the anus. The mass was partially circumferential no bleeding. A 10 mm polyps in the cecum was removed.      04/03/2015 Imaging    CT chest, abdomen and pelvis with contrast showed nodular masslike area of clinical worsening at rectosigmoid junction, tiny pericolonic lymph nodes, bilateral pulmonary nodules measuring about 1 cm.      04/12/2015 PET scan    Hypermetabolic colonic mass near rectosigmoid junction, tiny subcentimeter paracolonic lymph nodes. Bilateral pulmonary metastasis.      04/19/2015 Procedure    CT-guided lung nodule biopsy attempted, unsuccessful.      04/27/2015 - 09/14/2015 Chemotherapy    Oxaliplatin 130 mg/m on day 1, Capecitabine 2368m q12hr, 2 weeks on and one week off (only received 7 days for first cycle), oxaliplatin held on cycle 5 and dose reduced to 1057mm2, capecitabine reduced to 200044m12h D1-14, stopped per pt's request.       06/29/2015 - 10/19/2015 Chemotherapy   Panitumumab every 2 weeks, some cycles were postponed due to pt's request, stoppe per pt's request       09/18/2015 Imaging    Pulmonary nodules are less hypermetabolic, stable size. Stable hypermetabolic portal hepatis and abdominal peritoneal ligament lymph nodes. No other new lesions.       11/02/2015 Surgery    sigmoid colon segmental resection       11/02/2015 Pathology Results    Sigmoid colon segmental resection showed adenocarcinoma, grade 3,  T2, 3 out of 13 lymph nodes were positive, surgical margins were negative. LVI(-), perineural invasion negative      11/13/2015 Imaging    CT chest, abdomen and pelvis with contrast showed postsurgical changes, mild progression of pulmonary metastasis, measuring up to 15 mm in the right lower lobe.      11/29/2015 - 12/05/2015 Radiation Therapy    SBRT to 4 lung lesions in 3 sessions       04/25/2016 - 08/05/2016 Chemotherapy    FOLFIRI every 2 weeks, and Avastin added from cycle 3, 5-FU held since cycle 2 due to poor tolerance. Chemo was stopped after 3 months due to overall poor tolerance and pt's request to stop.       06/27/2016 Imaging    CT CAP 06/27/16 IMPRESSION: Status post partial left hemicolectomy. Progressive wall thickening involving the colon near the suture line, worrisome for residual tumor. Adjacent 7 mm short axis pericolonic lymph node, possibly reflecting a nodal metastasis. Radiation changes in the posterior  right upper lobe and superior segment right lower lobe. Progressive pulmonary metastases bilaterally, measuring up to 13 mm, as above.      09/17/2016 Imaging    CT A/P/C w contrast  IMPRESSION: 1. Multifocal pulmonary nodules are not significantly changed in size compared with the previous exam. 2. Stable appearance of radiation changes within the right midlung. 3. Stable to scratch set improved appearance of wall thickening involving the colon at the level of the suture line.       11/11/2016 Imaging     CT Chest WO Contrast 11/11/16: IMPRESSION: 1. Minimal enlargement of some pulmonary metastases. 2. Coronary artery calcification. 3. Hepatic steatosis.       02/10/2017 Imaging    CT CAP W Contrast 02/10/17 IMPRESSION: 1. Progressive metastatic disease to the thorax as demonstrated by increased size of numerous previously noted pulmonary nodules, what appears to be lymphangitic spread of disease developing in the right upper lobe and superior segment of the right lower lobe, and new 1.5 cm short axis prevascular lymph node. 2. No definite findings of metastatic disease in the abdomen or pelvis. 3. Multiple tiny nonobstructive calculi in the collecting systems of the kidneys bilaterally measuring 2-3 mm in size. No ureteral stones or findings of urinary tract obstruction are noted at this time. 4. Hepatic steatosis. 5. Aortic atherosclerosis, in addition to left anterior descending coronary artery disease. Please note that although the presence of coronary artery calcium documents the presence of coronary artery disease, the severity of this disease and any potential stenosis cannot be assessed on this non-gated CT examination. Assessment for potential risk factor modification, dietary therapy or pharmacologic therapy may be warranted, if clinically indicated. 6. There are calcifications of the aortic valve. Echocardiographic correlation for evaluation of potential valvular dysfunction may be warranted if clinically indicated.       02/27/2017 - 06/13/2017 Chemotherapy    Third-line chemotherapy Xeloda for 2 weeks on and 1 week off with Oxaliplatin every 3 weeks and Panitumumab every 2 weeks on a different day. He developed anaphylactic reaction to oxaliplatin, coded, oxaliplatin was subsequently stopped. He did not to tolerate Xeloda, plan to change to 5-FU bolus weekly, Held chemo since 03/20/17.   Restarted panitumumab every 2 weeks on 05/01/17 and ended on 06/13/17 due to disease  progression        06/11/2017 Imaging    CT CAP W Contrast 06/11/17  IMPRESSION: 1. Continued marked interval progression of pulmonary metastatic disease with mediastinal and left hilar metastases evident. 2. Interval development of a 4.3 cm metastatic lesion in the dome of the liver. 3. Stable nonobstructing nephrolithiasis.      06/26/2017 -  Chemotherapy    Fourth-Line chemo leucovorin/5-fu bolus weekly with avastin every 3 weeks - leucovorin bolus starting 06/26/17         HISTORY OF PRESENTING ILLNESS:  Bill Wright 57 y.o. male is here because of recently newly diagnosed colon cancer.  He has had intermittent bloody stool for 2 years, it has been mild, mixed with stool, patient does not have any abdominal pain, constipation, change of his bowel habits, nausea, weight loss or other symptoms. He did not seek medical attention for this. He went to emergency room on 03/15/2015 for right flank pain, due to his kidney stone. CT scan incidentally found a 11 mm right lower lobe nodule and mucosal edema in the sigmoid colon. He saw his primary care physician, and was referred to GI Dr. Michail Sermon here at he underwent colonoscopy on  03/30/2015, which showed a fungating infiltrative and ulcerated nonobstructing large mass in the sigmoid colon, biopsy showed adenocarcinoma. CT chest abdomen and pelvis showed multiple lung nodules measuring about 1 cm. He was referred to surgeon Dr. Zella Richer, who referred patient to [place] for further workup of his lung nodule and discuss chemotherapy.  He feels very well overall, denies any symptoms. 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 He never had screening colonoscopy prior the reason one, no significant past medical history, does not see doctors regularly.   CURRENT TREATMENT:  Fourth-Line chemo leucovorin/5-fu bolus weekly with avastin every 2 weeks, started 06/26/17.  will change weekly 5-fu/leucovorin to 3 weeks on and 1  week off from 08/21/17     [place] returns for follow-up and treatment. He presents to the clinic today noting he plans to return to work on 09/19/17 because he his pay is getting cut. He chooses to proceed with chemo today and skip treatment next week so he can have time to recover with his diarrhea.   On review of symptoms, pt notes to having intractable diarrhea over there past 2 weeks. He had 4 BMs yesterday. He takes Imodium twice. He has no energy. He notes most of his pain is gone, he does not use pain medication much. He notes mild nausea and cough. He notes he has lessened his eating has loss weight. He notes taste change and dry skin from chemo.  He denies pain, fever, chills or SOB.    MEDICAL HISTORY:  Past Medical History:  Diagnosis Date  . Anxiety    situational due to cancer diagnosis  . Cancer [facility]) 2017   colon-chemo 09/22/15 now surgery  . Hypercholesteremia   . Kidney calculi     SURGICAL HISTORY: Past Surgical History:  Procedure Laterality Date  . COLONOSCOPY  03/30/15  . IR CV LINE INJECTION  04/09/2017  . IR FLUORO GUIDE PORT INSERTION LEFT  06/26/2017  . IR FLUORO GUIDE PORT INSERTION RIGHT  04/15/2017  . IR REMOVAL TUN ACCESS W/ PORT W/O FL MOD SED  04/15/2017  . IR REMOVAL TUN ACCESS W/ PORT W/O FL MOD SED  06/26/2017  . IR US GUIDE VASC ACCESS LEFT  06/26/2017  . IR US GUIDE VASC ACCESS RIGHT  04/15/2017  . LAPAROSCOPIC PARTIAL COLECTOMY N/A 11/02/2015   Procedure: LAPAROSCOPIC ASSISTED SIGMOID COLECTOMY;  Surgeon: Jackolyn Confer, MD;  Location: [facility];  Service: General;  Laterality: N/A;  . LEFT HEART CATH AND CORONARY ANGIOGRAPHY N/A 03/21/2017   Procedure: LEFT HEART CATH AND CORONARY ANGIOGRAPHY;  Surgeon: Dixie Dials, MD;  Location: [facility] [place] [facility];  Service: Cardiovascular;  Laterality: N/A;  . PORTACATH PLACEMENT N/A 05/08/2016   Procedure: INSERTION PORT-A-CATH;  Surgeon: Jackolyn Confer, MD;  Location: [facility];  Service: General;   Laterality: N/A;    SOCIAL HISTORY: Social History   Social History  . Marital Status: Married    Spouse Name: N/A  . Number of Children: 3, age of 73, 21 and 53   . Years of Education: N/A   Occupational History  . Banker for [employer] [place]    Social History Main Topics  . Smoking status: Never Smoker   . Smokeless tobacco: Not on file  . Alcohol Use: No  . Drug Use: No  . Sexual Activity: Not on file   Other Topics Concern  . Not on file   Social History Narrative  FAMILY HISTORY: Family History  Problem Relation Age of Onset  . Breast cancer Mother 37       +rad and lymph node  . Diabetes Maternal Grandmother        leading to blindness  . Obesity Maternal Aunt   . Stroke Maternal Uncle 49    ALLERGIES:  is allergic to oxaliplatin.  MEDICATIONS:  Current Outpatient Medications  Medication Sig Dispense Refill  . albuterol (VENTOLIN HFA) 108 (90 Base) MCG/ACT inhaler Inhale 1 puff into the lungs daily as needed for wheezing or shortness of breath. 10 PUFFS DAILY FOR WHEEZING AND SHORTNESS OF BREATH    . clindamycin (CLINDAGEL) 1 % gel Apply 2 (two) times daily topically. 30 g 0  . diltiazem (CARDIZEM) 30 MG tablet Take 30 mg by mouth 3 (three) times daily.    . diphenoxylate-atropine (LOMOTIL) 2.5-0.025 MG tablet Take 1-2 tablets by mouth 4 (four) times daily as needed for diarrhea or loose stools. 40 tablet 2  . Fluticasone-Salmeterol (ADVAIR DISKUS) 250-50 MCG/DOSE AEPB Inhale 1 puff into the lungs 2 (two) times daily. (Patient taking differently: Inhale 1 puff into the lungs daily. ) 180 each 1  . HYDROcodone-acetaminophen (NORCO/VICODIN) 5-325 MG tablet Take 1 tablet by mouth every 6 (six) hours as needed for moderate pain or severe pain. 30 tablet 0  . Hydrocodone-Homatropine 5-1.5 MG TABS Take 1 tablet by mouth every 6 (six) hours as needed. 60 each 0  . LORazepam (ATIVAN) 0.5 MG tablet Take 1 tablet (0.5 mg total) by mouth every 6 (six) hours as needed  (Nausea or vomiting). 30 tablet 0  . metoprolol tartrate (LOPRESSOR) 25 MG tablet Take 1 tablet (25 mg total) by mouth 2 (two) times daily. 60 tablet 3  . mometasone (ELOCON) 0.1 % cream Apply 1 application topically daily. 45 g 2  . ondansetron (ZOFRAN) 8 MG tablet Take 1 tablet (8 mg total) by mouth 2 (two) times daily as needed (Nausea or vomiting). (Patient not taking: Reported on 07/24/2017) 30 tablet 1  . prochlorperazine (COMPAZINE) 10 MG tablet Take 1 tablet (10 mg total) by mouth every 6 (six) hours as needed (Nausea or vomiting). (Patient not taking: Reported on 07/24/2017) 30 tablet 1   No current facility-administered medications for this visit.    Facility-Administered Medications Ordered in Other Visits  Medication Dose Route Frequency Provider Last Rate Last Dose  . fluorouracil (ADRUCIL) chemo injection 1,300 mg  600 mg/m2 (Treatment Plan Recorded) Intravenous Once Truitt Merle, MD      . heparin lock flush 100 unit/mL  500 Units Intracatheter Once PRN Truitt Merle, MD      . leucovorin 1,090 mg in dextrose 5 % 250 mL infusion  500 mg/m2 (Treatment Plan Recorded) Intravenous Once Truitt Merle, MD 152 mL/hr at 08/07/17 1112 1,090 mg at 08/07/17 1112  . sodium chloride flush (NS) 0.9 % injection 10 mL  10 mL Intracatheter PRN Truitt Merle, MD        REVIEW OF SYSTEMS:  Constitutional: Denies fevers, chills or abnormal night sweats, no weight loss. (+) significant fatigue (+) weight loss (+) lower appetite/taste change Eyes: Denies blurriness of vision, double vision or watery eyes Ears, nose, mouth, throat, and face: Denies mucositis or sore throat Respiratory: Denies wheezes (+) mild cough Cardiovascular: (+) occasional chest pain Gastrointestinal:  Denies heartburn (+) diarrhea (+) mild nausea Skin: (+) dry skin Lymphatics: Denies new lymphadenopathy or easy bruising Neurological:Denies numbness, tingling or new weaknesses MSK: normal Behavioral/Psych: Mood is stable, no  new changes    All other systems were reviewed with the patient and are negative.  PHYSICAL EXAMINATION:  ECOG PERFORMANCE STATUS: 1-2 Vitals:   08/07/17 0925  BP: 132/87  Pulse: 89  Resp: 18  Temp: 97.9 F (36.6 C)  TempSrc: Oral  SpO2: 100%  Weight: 203 lb (92.1 kg)  Height: 5' 6"  (1.676 m)    GENERAL:alert, no distress and comfortable SKIN: skin color, texture, turgor are normal. mild skin erythema on his cheeks. Diffuse acne-like skin rash on his face, scalp, neck and upper chest, no skin erythema or signs of infection.  EYES: normal, conjunctiva are pink and non-injected, sclera clear OROPHARYNX:no exudate, no erythema and lips, buccal mucosa, and tongue normal  NECK: supple, thyroid normal size, non-tender, without nodularity LYMPH:  no palpable lymphadenopathy in the cervical, axillary or inguinal LUNGS: clear to auscultation and percussion with normal breathing effort HEART: regular rate & rhythm and no murmurs and no lower extremity edema ABDOMEN:abdomen soft, non-tender and normal bowel sounds. The midline incision has healed well, no discharge or skin erythema  Musculoskeletal:no cyanosis of digits and no clubbing  PSYCH: alert & oriented x 3 with fluent speech NEURO: no focal motor/sensory deficits  LABORATORY DATA:  I have reviewed the data as listed CBC Latest Ref Rng & Units 08/07/2017 07/31/2017 07/24/2017  WBC 4.0 - 10.3 K/uL 5.8 5.4 6.0  Hemoglobin 13.0 - 17.1 g/dL 12.5(L) 13.1 13.4  Hematocrit 38.4 - 49.9 % 37.5(L) 38.7 40.1  Platelets 140 - 400 K/uL 195 185 204   CMP Latest Ref Rng & Units 08/07/2017 07/31/2017 07/24/2017  Glucose 70 - 140 mg/dL 105 111 124  BUN 7 - 26 mg/dL 12 12 11   Creatinine 0.70 - 1.30 mg/dL 0.84 0.88 0.87  Sodium 136 - 145 mmol/L 137 139 139  Potassium 3.5 - 5.1 mmol/L 4.0 3.9 4.3  Chloride 98 - 109 mmol/L 102 105 107  CO2 22 - 29 mmol/L 26 25 22   Calcium 8.4 - 10.4 mg/dL 8.9 8.8 9.2  Total Protein 6.4 - 8.3 g/dL 6.5 6.5 6.8  Total Bilirubin 0.2  - 1.2 mg/dL 1.1 0.7 0.7  Alkaline Phos 40 - 150 U/L 104 92 94  AST 5 - 34 U/L 32 36(H) 17  ALT 0 - 55 U/L 95(H) 62(H) 18    CEA:  04/20/2016: <0.5 06/08/2015: <0.5 09/07/2015: <0.5 11/15/2015: 0.9 02/16/2016: <1.0  04/25/2016: 1.6 05/23/16: <1.00 06/20/2016: 1.05 07/22/2016: <1.00 09/17/16: <1.00 11/14/16: 1.60 02/10/17: 5.94 03/20/2017: 4.92 05/01/17: 13.29 05/30/17: 19.94 06/13/17: 27.88 07/10/17: 41.26  08/07/17: 25.45   PATHOLOGY REPORT  Diagnosis 11/02/2015 1. Colon, segmental resection for tumor, sigmoid ADENOCARCINOMA OF THE SIGMOID COLON (2.0 CM), GRADE 3 THE TUMOR INVADES MUSCULARIS PROPRIA MARGINS OF RESECTION ARE NEGATIVE METASTATIC ADENOCARCINOMA IN THREE OF THIRTEEN LYMPH NODES (3/13) 2. Colon, resection margin (donut), distal sigmoid BENIGN COLONIC TISSUE Microscopic Comment 1. COLON AND RECTUM (INCLUDING TRANS-ANAL RESECTION): Specimen: Sigmoid Procedure: Segmental resection Tumor site: sigmoid Specimen integrity: Intact Macroscopic intactness of mesorectum: Not applicable: x Complete: NA Near complete: NA Incomplete: NA Cannot be determined (specify): NA Macroscopic tumor perforation: Muscularis Invasive tumor: Maximum size: 2.0 cm Histologic type(s): Adenocarcinoma Histologic grade and differentiation: G3 G1: well differentiated/low grade G2: moderately differentiated/low grade G3: poorly differentiated/high grade G4: undifferentiated/high grade Type of polyp in which invasive carcinoma arose: Tubular adenoma Microscopic extension of invasive tumor: Muscularis propria Lymph-Vascular invasion: Negative Peri-neural invasion: Negative Tumor deposit(s) (discontinuous extramural extension): Negative Resection margins: Proximal margin: Negative Distal margin: Negative Circumferential (  radial) (posterior ascending, posterior descending; lateral and posterior mid-rectum; and entire lower 1/3 rectum):Negative Mesenteric margin (sigmoid and transverse):  Negative Distance closest margin (if all above margins negative): 3.5 cm Trans-anal resection margins only: Deep margin: NA Mucosal Margin: NA Distance closest mucosal margin (if negative): NA Treatment effect (neo-adjuvant therapy): Partial Additional polyp(s): None Non-neoplastic findings: unremarkable Lymph nodes: number examined 13; number positive: 3 Pathologic Staging: T2, N1b, M1a     RADIOGRAPHIC STUDIES: I have personally reviewed the radiological images as listed and agreed with the findings in the report.  PET 04/12/2015 IMPRESSION: Hypermetabolic colonic mass near rectosigmoid junction, consistent with known primary colon carcinoma. Tiny sub-cm pericolonic lymph nodes in sigmoid mesocolon are too small to characterize by PET, but are suspicious for early lymph node metastases. Bilateral pulmonary metastases  PET 09/18/2015 IMPRESSION: 1. Hypermetabolic pulmonary metastases have enlarged slightly from 07/18/2015. 2. Hypermetabolic porta hepatis/abdominal peritoneal ligament lymph nodes, stable from 04/12/2015. 3. Increase in hypermetabolism associated with mesenteric haziness and nodularity. While a reactive phenomenon/panniculitis can create this in appearance, metastatic disease/lymphoproliferative disorder cannot be excluded. 4. Hepatic steatosis. 5. Bilateral nephrolithiasis.  CT chest, abdomen and pelvis with contrast 06/27/2016 IMPRESSION: Status post partial left hemicolectomy. Progressive wall thickening involving the colon near the suture line, worrisome for residual tumor. Adjacent 7 mm short axis pericolonic lymph node, possibly reflecting a nodal metastasis. Radiation changes in the posterior right upper lobe and superior segment right lower lobe. Progressive pulmonary metastases bilaterally, measuring up to 13 mm, as above.   CT A/P/C w contrast 09/17/16:  IMPRESSION: 1. Multifocal pulmonary nodules are not significantly changed  in size compared with the previous exam. 2. Stable appearance of radiation changes within the right midlung. 3. Stable to scratch set improved appearance of wall thickening involving the colon at the level of the suture line.   CT Chest WO Contrast 11/11/16: IMPRESSION: 1. Minimal enlargement of some pulmonary metastases. 2. Coronary artery calcification. 3. Hepatic steatosis.   CT CAP W Contrast 02/10/17 IMPRESSION: 1. Progressive metastatic disease to the thorax as demonstrated by increased size of numerous previously noted pulmonary nodules, what appears to be lymphangitic spread of disease developing in the right upper lobe and superior segment of the right lower lobe, and new 1.5 cm short axis prevascular lymph node. 2. No definite findings of metastatic disease in the abdomen or pelvis. 3. Multiple tiny nonobstructive calculi in the collecting systems of the kidneys bilaterally measuring 2-3 mm in size. No ureteral stones or findings of urinary tract obstruction are noted at this time. 4. Hepatic steatosis. 5. Aortic atherosclerosis, in addition to left anterior descending coronary artery disease. Please note that although the presence of coronary artery calcium documents the presence of coronary artery disease, the severity of this disease and any potential stenosis cannot be assessed on this non-gated CT examination. Assessment for potential risk factor modification, dietary therapy or pharmacologic therapy may be warranted, if clinically indicated. 6. There are calcifications of the aortic valve. Echocardiographic correlation for evaluation of potential valvular dysfunction may be warranted if clinically indicated.   CT CAP W Contrast 06/11/17  IMPRESSION: 1. Continued marked interval progression of pulmonary metastatic disease with mediastinal and left hilar metastases evident. 2. Interval development of a 4.3 cm metastatic lesion in the dome of the liver. 3. Stable  nonobstructing nephrolithiasis.  CT ANGIO CHEST PE 06/30/17  IMPRESSION: -No definite evidence of pulmonary embolus. -Stable aortopulmonary window adenopathy is noted. -Significantly increased subcarinal adenopathy is noted concerning for worsening metastatic  disease. -Continued presence of left hilar mass or adenopathy is noted, which appears to be resulting in increased postobstructive atelectasis or pneumonia. -Stable pulmonary nodules or metastatic lesions are noted in both lower lobes. -Right hepatic metastatic lesion noted on prior exam is not well visualized currently.   ASSESSMENT & PLAN:  57 y.o. male, without significant past medical history except kidney stone, presented with intermittent bloody stool for 2 years, and colonoscopy showed a large sigmoid colon mass, CT scan showed multiple (at least 4) nodules in bilateral lungs, measuring about 1 cm.  1. Sigmoid colon adenocarcinoma, pT2N1bM1, stage IV with lung mets, KRA/NRAS wild type, MSI-stable -I previously reviewed his colonoscopy, initial CT scan findings and the biopsy results in great details with patient and his wife. -he received first line chemo FOLFOX and panitumumab, followed by hemicolectomy, SBRT to 4 lung nodules, and second line chemo irinotecan and avastin, chemotherapy was finally stopped due to his poor tolerance. -I previously reviewed his surgical pathology findings, which showed a residual T2 primary tumor, 3 lymph nodes positive, grade 3 disease, certainly high risk disease.  -We previously discussed that his disease is likely incurable, and the goal of therapy is maximum disease control and prolong his life.  -- His tumor does not contain KRAS/NRAS or BRAF mutation, so he would benefit from EGFR antibody, Panitumumab was added on from cycle 4 but tolerated poorly dur to skin rash  -he has been on and off chemo and panitumumab due to poor tolerance overall  --Unfortunately he previously developed  anaphylactic reaction to oxaliplatin with cardiac arrest on 03/20/2017 and was successfully resuscitated, chemotherapy was held -no more oxaliplatin due to cardiac arrest  -We restarted Panitumumab every 2 weeks on 05/01/17, I recommended weekly 5-Fu bolus but pt does not feel he is mentally ready for chemo yet  -We reviewed his CT CAP images from 06/11/17 which shows significant growth in his pulmonary metastatic lesions and a new liver met.  -Antibody Panitumumab alone is not controlling this disease. I discussed the treatment options of 5-Fu bolus weekly (he refuses 5-fu pump) with Avastin and leucovorin,  Referral to other cancer center for clinical trail or taking oral biological agent regorafenib or Lonsurf (5 days a week for 2 weeks and off 2 week).  -I discussed the goal of care is to control his disease, however we are close to exhausting treatment options outside of clinical trails. -After a lengthy discussion, He agreed to start 5-fu bolus/leucovorin bolus [facility] regimen) and avastin on 06/26/17 -his dyspnea has significantly improved since he started 5-FU and Avastin, he tolerating well.  -He has moderately tolerated current treatment regimen well, with fatigue, diarrhea, mild nausea and dry darkened skin.  -He has had clinical response to chemotherapy.  His dyspnea has resolved, chest pain and cough much improved.  -I will prescribe lomotil today and he will increase imodium up to 8 tabs as needed.  -Labs reviewed, Hg at 12.5, overall adequate for treatment today. -He will hold treatment next week to allow for his diarrhea to resolve. Starting in 2 weeks he will return with 5-FU and Leucovorin for 3 weeks on and 1 weeks off regimen.  -Repeat CT scan in 2 weeks  -F/u in 2 weeks   2. Cardiac arrest on 03/20/2017 -Secondary to oxaliplatin -He underwent cardiac catheterization, which was negative for coronary artery disease -He will continue follow-up with his cardiologist Dr. Dorthy Cooler   -He is on metoprolol, continues to follow cardiologist   3. Dry  cough and dyspnea  -His initial cough is likely related to his SBRT, he received a course of prednisone  -I'm concerned his dry cough to be related to his cancer progression in the lung  -previously Much improved since he started chemotherapy -Cough got worsened and is ongoing and aggressive due to cancer progression.  -Continue Hycodan as needed -Dyspnea resolved, dry cough much improved   4. Obesity  -I again encouraged him to eat healthy and exercise  -Given his lower appetite his has loss some weight. I recommend he work on his appetite.   5. Anxiety  -He has been having palpitation, sweating episodes, after his cardiac arrest event. Possible anxiety attacks  - I suggested he try low dose Xanax to help with his anxiety, he refuses at this time -Anxiety has improved some since he started chemo again   6. Left leg neuropathy -Possibly related to chemotherapy, also not very typical  -I suggest him to take over-the-counter vitamin B complex  7. Goal of care discussion  -We again discussed the incurable nature of his cancer, although his cancer seems to be more indolent -The patient understands the goal of care is palliative. -He is full code now   8.  Intermittent chest pain -He has been taking Oxycodone once a day as needed due to his high intensity job and some chest discomfort which could be related to stress.  -He really does not have much cancer related pain. We discussed narcotic addiction, I recommend him to gradually wean off oxycodone. He tried but not successful  -He is down to 1-2 hydrocodone weekly, refilled on 05/16/17 -His overall chest pain his resolved, but has a mild right sided chest pain this has not subsided.    9. Elevated BP, likely secondary to Avastin -It was suggested to restart diltiazem and metoprolol and he was encouraged to follow up with his cardiologist.  -Copied note to his  cardiologist.  Will monitor his BP closely  10. Emotional support -Pt's wife has been stressed out lately due to his disease progression, treatments, etc.   -she has met with social worker [name] for counseling -Pt states he is not stressed out, I encourage him to talk to his wife     PLAN -Lab reviewed, adequate for treatment, will proceed 5-FU and leucovorin today  -prescribed Lomotil today  -Lab, flush,and chemo 16f/LV in 2, 3, 4 weeks  -F/u in 2 weeks with CT chest, abd/pel with contrast  around 2/12   I spent 20 minutes counseling the patient face to face. The total time spent in the appointment was 30 minutes and more than 50% was on counseling.  This document serves as a record of services personally performed by YTruitt Merle MD. It was created on her behalf by AJoslyn Devon a trained medical scribe. The creation of this record is based on the scribe's personal observations and the provider's statements to them.    I have reviewed the above documentation for accuracy and completeness, and I agree with the above.    YTruitt Merle MD 08/07/2017

## 2017-08-07 ENCOUNTER — Inpatient Hospital Stay (HOSPITAL_BASED_OUTPATIENT_CLINIC_OR_DEPARTMENT_OTHER): Payer: BLUE CROSS/BLUE SHIELD | Admitting: Hematology

## 2017-08-07 ENCOUNTER — Encounter: Payer: Self-pay | Admitting: Hematology

## 2017-08-07 ENCOUNTER — Inpatient Hospital Stay: Payer: BLUE CROSS/BLUE SHIELD

## 2017-08-07 VITALS — BP 132/87 | HR 89 | Temp 97.9°F | Resp 18 | Ht 66.0 in | Wt 203.0 lb

## 2017-08-07 DIAGNOSIS — C7800 Secondary malignant neoplasm of unspecified lung: Principal | ICD-10-CM

## 2017-08-07 DIAGNOSIS — R634 Abnormal weight loss: Secondary | ICD-10-CM

## 2017-08-07 DIAGNOSIS — C187 Malignant neoplasm of sigmoid colon: Secondary | ICD-10-CM | POA: Diagnosis not present

## 2017-08-07 DIAGNOSIS — C7802 Secondary malignant neoplasm of left lung: Secondary | ICD-10-CM | POA: Diagnosis not present

## 2017-08-07 DIAGNOSIS — G629 Polyneuropathy, unspecified: Secondary | ICD-10-CM

## 2017-08-07 DIAGNOSIS — E669 Obesity, unspecified: Secondary | ICD-10-CM | POA: Diagnosis not present

## 2017-08-07 DIAGNOSIS — C189 Malignant neoplasm of colon, unspecified: Secondary | ICD-10-CM

## 2017-08-07 DIAGNOSIS — R05 Cough: Secondary | ICD-10-CM

## 2017-08-07 DIAGNOSIS — F419 Anxiety disorder, unspecified: Secondary | ICD-10-CM

## 2017-08-07 DIAGNOSIS — Z8674 Personal history of sudden cardiac arrest: Secondary | ICD-10-CM | POA: Diagnosis not present

## 2017-08-07 DIAGNOSIS — R110 Nausea: Secondary | ICD-10-CM | POA: Diagnosis not present

## 2017-08-07 DIAGNOSIS — C7801 Secondary malignant neoplasm of right lung: Secondary | ICD-10-CM

## 2017-08-07 DIAGNOSIS — Z95828 Presence of other vascular implants and grafts: Secondary | ICD-10-CM

## 2017-08-07 DIAGNOSIS — R197 Diarrhea, unspecified: Secondary | ICD-10-CM | POA: Diagnosis not present

## 2017-08-07 DIAGNOSIS — Z5111 Encounter for antineoplastic chemotherapy: Secondary | ICD-10-CM | POA: Diagnosis not present

## 2017-08-07 LAB — CBC WITH DIFFERENTIAL/PLATELET
BASOS ABS: 0 10*3/uL (ref 0.0–0.1)
Basophils Relative: 0 %
EOS PCT: 6 %
Eosinophils Absolute: 0.4 10*3/uL (ref 0.0–0.5)
HEMATOCRIT: 37.5 % — AB (ref 38.4–49.9)
HEMOGLOBIN: 12.5 g/dL — AB (ref 13.0–17.1)
LYMPHS ABS: 1.2 10*3/uL (ref 0.9–3.3)
LYMPHS PCT: 20 %
MCH: 28.7 pg (ref 27.2–33.4)
MCHC: 33.3 g/dL (ref 32.0–36.0)
MCV: 86.2 fL (ref 79.3–98.0)
Monocytes Absolute: 0.7 10*3/uL (ref 0.1–0.9)
Monocytes Relative: 12 %
NEUTROS ABS: 3.6 10*3/uL (ref 1.5–6.5)
NEUTROS PCT: 62 %
PLATELETS: 195 10*3/uL (ref 140–400)
RBC: 4.35 MIL/uL (ref 4.20–5.82)
RDW: 15.5 % — ABNORMAL HIGH (ref 11.0–14.6)
WBC: 5.8 10*3/uL (ref 4.0–10.3)

## 2017-08-07 LAB — COMPREHENSIVE METABOLIC PANEL
ALT: 95 U/L — ABNORMAL HIGH (ref 0–55)
ANION GAP: 9 (ref 3–11)
AST: 32 U/L (ref 5–34)
Albumin: 3.5 g/dL (ref 3.5–5.0)
Alkaline Phosphatase: 104 U/L (ref 40–150)
BILIRUBIN TOTAL: 1.1 mg/dL (ref 0.2–1.2)
BUN: 12 mg/dL (ref 7–26)
CHLORIDE: 102 mmol/L (ref 98–109)
CO2: 26 mmol/L (ref 22–29)
Calcium: 8.9 mg/dL (ref 8.4–10.4)
Creatinine, Ser: 0.84 mg/dL (ref 0.70–1.30)
Glucose, Bld: 105 mg/dL (ref 70–140)
POTASSIUM: 4 mmol/L (ref 3.5–5.1)
Sodium: 137 mmol/L (ref 136–145)
TOTAL PROTEIN: 6.5 g/dL (ref 6.4–8.3)

## 2017-08-07 LAB — CEA (IN HOUSE-CHCC): CEA (CHCC-In House): 25.45 ng/mL — ABNORMAL HIGH (ref 0.00–5.00)

## 2017-08-07 MED ORDER — SODIUM CHLORIDE 0.9% FLUSH
10.0000 mL | INTRAVENOUS | Status: DC | PRN
  Administered 2017-08-07: 10 mL
  Filled 2017-08-07: qty 10

## 2017-08-07 MED ORDER — SODIUM CHLORIDE 0.9 % IV SOLN: Freq: Once | INTRAVENOUS | Status: DC

## 2017-08-07 MED ORDER — SODIUM CHLORIDE 0.9 % IV SOLN
Freq: Once | INTRAVENOUS | Status: AC
  Administered 2017-08-07: 10:00:00 via INTRAVENOUS

## 2017-08-07 MED ORDER — DIPHENOXYLATE-ATROPINE 2.5-0.025 MG PO TABS: 1.0000 | ORAL_TABLET | Freq: Four times a day (QID) | ORAL | 2 refills | Status: AC | PRN

## 2017-08-07 MED ORDER — ONDANSETRON HCL 4 MG/2ML IJ SOLN
8.0000 mg | Freq: Once | INTRAMUSCULAR | Status: AC
  Administered 2017-08-07: 8 mg via INTRAVENOUS

## 2017-08-07 MED ORDER — ONDANSETRON HCL 4 MG/2ML IJ SOLN
INTRAMUSCULAR | Status: AC
  Filled 2017-08-07: qty 4

## 2017-08-07 MED ORDER — PROCHLORPERAZINE MALEATE 10 MG PO TABS
10.0000 mg | ORAL_TABLET | Freq: Once | ORAL | Status: AC
  Administered 2017-08-07: 10 mg via ORAL

## 2017-08-07 MED ORDER — FLUOROURACIL CHEMO INJECTION 2.5 GM/50ML
600.0000 mg/m2 | Freq: Once | INTRAVENOUS | Status: AC
  Administered 2017-08-07: 1300 mg via INTRAVENOUS
  Filled 2017-08-07: qty 26

## 2017-08-07 MED ORDER — PROCHLORPERAZINE MALEATE 10 MG PO TABS
ORAL_TABLET | ORAL | Status: AC
  Filled 2017-08-07: qty 1

## 2017-08-07 MED ORDER — HEPARIN SOD (PORK) LOCK FLUSH 100 UNIT/ML IV SOLN
500.0000 [IU] | Freq: Once | INTRAVENOUS | Status: AC | PRN
  Administered 2017-08-07: 500 [IU]
  Filled 2017-08-07: qty 5

## 2017-08-07 MED ORDER — SODIUM CHLORIDE 0.9% FLUSH
10.0000 mL | INTRAVENOUS | Status: DC | PRN
  Administered 2017-08-07: 10 mL via INTRAVENOUS
  Filled 2017-08-07: qty 10

## 2017-08-07 MED ORDER — DEXTROSE 5 % IV SOLN
500.0000 mg/m2 | Freq: Once | INTRAVENOUS | Status: AC
Start: 2017-08-07 — End: 2017-08-07
  Administered 2017-08-07: 1090 mg via INTRAVENOUS
  Filled 2017-08-07: qty 54.5

## 2017-08-07 NOTE — Patient Instructions (Signed)
[facility] Discharge Instructions for Patients Receiving Chemotherapy  Today you received the following chemotherapy agent: Leucovorin and 5FU.  To help prevent nausea and vomiting after your treatment, we encourage you to take your nausea medication as directed.   If you develop nausea and vomiting that is not controlled by your nausea medication, call the clinic.   BELOW ARE SYMPTOMS THAT SHOULD BE REPORTED IMMEDIATELY:  *FEVER GREATER THAN 100.5 F  *CHILLS WITH OR WITHOUT FEVER  NAUSEA AND VOMITING THAT IS NOT CONTROLLED WITH YOUR NAUSEA MEDICATION  *UNUSUAL SHORTNESS OF BREATH  *UNUSUAL BRUISING OR BLEEDING  TENDERNESS IN MOUTH AND THROAT WITH OR WITHOUT PRESENCE OF ULCERS  *URINARY PROBLEMS  *BOWEL PROBLEMS  UNUSUAL RASH Items with * indicate a potential emergency and should be followed up as soon as possible.  Feel free to call the clinic should you have any questions or concerns. The clinic phone number is [phone].  Please show the [facility] at check-in to the Emergency Department and triage nurse.

## 2017-08-08 ENCOUNTER — Ambulatory Visit: Payer: BLUE CROSS/BLUE SHIELD

## 2017-08-08 ENCOUNTER — Telehealth: Payer: Self-pay | Admitting: General Practice

## 2017-08-08 ENCOUNTER — Other Ambulatory Visit: Payer: BLUE CROSS/BLUE SHIELD

## 2017-08-08 NOTE — Telephone Encounter (Signed)
[facility] CSW Progress Note  Wife no showed for appt today, reached out to offer continued support/counseling as needed to facilitate adjustment to illness in family.  Left VM requesting call back.  [name], LCSW Clinical Social Worker Phone:  [phone]

## 2017-08-14 ENCOUNTER — Ambulatory Visit: Payer: BLUE CROSS/BLUE SHIELD | Admitting: Nurse Practitioner

## 2017-08-14 ENCOUNTER — Other Ambulatory Visit: Payer: BLUE CROSS/BLUE SHIELD

## 2017-08-14 ENCOUNTER — Ambulatory Visit: Payer: BLUE CROSS/BLUE SHIELD

## 2017-08-19 ENCOUNTER — Ambulatory Visit (HOSPITAL_COMMUNITY)
Admission: RE | Admit: 2017-08-19 | Discharge: 2017-08-19 | Disposition: A | Discharge status: Home / Self Care | Payer: BLUE CROSS/BLUE SHIELD | Source: Ambulatory Visit | Attending: Hematology | Admitting: Hematology

## 2017-08-19 DIAGNOSIS — Z9889 Other specified postprocedural states: Secondary | ICD-10-CM | POA: Diagnosis not present

## 2017-08-19 DIAGNOSIS — K769 Liver disease, unspecified: Secondary | ICD-10-CM | POA: Insufficient documentation

## 2017-08-19 DIAGNOSIS — C189 Malignant neoplasm of colon, unspecified: Secondary | ICD-10-CM | POA: Insufficient documentation

## 2017-08-19 DIAGNOSIS — C7800 Secondary malignant neoplasm of unspecified lung: Secondary | ICD-10-CM | POA: Insufficient documentation

## 2017-08-19 MED ORDER — IOPAMIDOL (ISOVUE-300) INJECTION 61%
100.0000 mL | Freq: Once | INTRAVENOUS | Status: AC | PRN
  Administered 2017-08-19: 100 mL via INTRAVENOUS

## 2017-08-19 MED ORDER — HEPARIN SOD (PORK) LOCK FLUSH 100 UNIT/ML IV SOLN
INTRAVENOUS | Status: AC
  Filled 2017-08-19: qty 5

## 2017-08-19 MED ORDER — IOPAMIDOL (ISOVUE-300) INJECTION 61%
INTRAVENOUS | Status: AC
  Filled 2017-08-19: qty 100

## 2017-08-19 MED ORDER — HEPARIN SOD (PORK) LOCK FLUSH 100 UNIT/ML IV SOLN
500.0000 [IU] | Freq: Once | INTRAVENOUS | Status: AC
  Administered 2017-08-19: 500 [IU] via INTRAVENOUS

## 2017-08-21 ENCOUNTER — Inpatient Hospital Stay (HOSPITAL_BASED_OUTPATIENT_CLINIC_OR_DEPARTMENT_OTHER): Payer: BLUE CROSS/BLUE SHIELD | Admitting: Nurse Practitioner

## 2017-08-21 ENCOUNTER — Inpatient Hospital Stay: Payer: BLUE CROSS/BLUE SHIELD

## 2017-08-21 ENCOUNTER — Inpatient Hospital Stay: Payer: BLUE CROSS/BLUE SHIELD | Attending: Hematology

## 2017-08-21 ENCOUNTER — Encounter: Payer: Self-pay | Admitting: Nurse Practitioner

## 2017-08-21 VITALS — BP 131/94 | HR 109 | Temp 97.6°F | Resp 17 | Ht 66.0 in | Wt 192.6 lb

## 2017-08-21 VITALS — BP 141/102 | HR 108 | Temp 97.7°F | Resp 18 | Wt 195.0 lb

## 2017-08-21 DIAGNOSIS — E669 Obesity, unspecified: Secondary | ICD-10-CM | POA: Insufficient documentation

## 2017-08-21 DIAGNOSIS — C187 Malignant neoplasm of sigmoid colon: Secondary | ICD-10-CM

## 2017-08-21 DIAGNOSIS — F419 Anxiety disorder, unspecified: Secondary | ICD-10-CM | POA: Diagnosis not present

## 2017-08-21 DIAGNOSIS — Z5112 Encounter for antineoplastic immunotherapy: Secondary | ICD-10-CM | POA: Insufficient documentation

## 2017-08-21 DIAGNOSIS — R030 Elevated blood-pressure reading, without diagnosis of hypertension: Secondary | ICD-10-CM | POA: Insufficient documentation

## 2017-08-21 DIAGNOSIS — C7802 Secondary malignant neoplasm of left lung: Secondary | ICD-10-CM | POA: Insufficient documentation

## 2017-08-21 DIAGNOSIS — Z8674 Personal history of sudden cardiac arrest: Secondary | ICD-10-CM | POA: Insufficient documentation

## 2017-08-21 DIAGNOSIS — C7801 Secondary malignant neoplasm of right lung: Secondary | ICD-10-CM | POA: Diagnosis not present

## 2017-08-21 DIAGNOSIS — C189 Malignant neoplasm of colon, unspecified: Secondary | ICD-10-CM

## 2017-08-21 DIAGNOSIS — Z5111 Encounter for antineoplastic chemotherapy: Secondary | ICD-10-CM | POA: Insufficient documentation

## 2017-08-21 DIAGNOSIS — C7800 Secondary malignant neoplasm of unspecified lung: Secondary | ICD-10-CM

## 2017-08-21 DIAGNOSIS — Z95828 Presence of other vascular implants and grafts: Secondary | ICD-10-CM

## 2017-08-21 LAB — COMPREHENSIVE METABOLIC PANEL
ALT: 60 U/L — AB (ref 0–55)
AST: 30 U/L (ref 5–34)
Albumin: 3.3 g/dL — ABNORMAL LOW (ref 3.5–5.0)
Alkaline Phosphatase: 112 U/L (ref 40–150)
Anion gap: 8 (ref 3–11)
BUN: 10 mg/dL (ref 7–26)
CHLORIDE: 105 mmol/L (ref 98–109)
CO2: 24 mmol/L (ref 22–29)
CREATININE: 0.83 mg/dL (ref 0.70–1.30)
Calcium: 8.9 mg/dL (ref 8.4–10.4)
GFR calc non Af Amer: >60 mL/min (ref >60)
Glucose, Bld: 100 mg/dL (ref 70–140)
Potassium: 4.8 mmol/L (ref 3.5–5.1)
SODIUM: 137 mmol/L (ref 136–145)
Total Bilirubin: 1.2 mg/dL (ref 0.2–1.2)
Total Protein: 6.7 g/dL (ref 6.4–8.3)

## 2017-08-21 LAB — CBC WITH DIFFERENTIAL/PLATELET
Basophils Absolute: 0 10*3/uL (ref 0.0–0.1)
Basophils Relative: 1 %
EOS ABS: 0.1 10*3/uL (ref 0.0–0.5)
EOS PCT: 1 %
HCT: 39.1 % (ref 38.4–49.9)
Hemoglobin: 13 g/dL (ref 13.0–17.1)
LYMPHS ABS: 1.2 10*3/uL (ref 0.9–3.3)
Lymphocytes Relative: 29 %
MCH: 29.4 pg (ref 27.2–33.4)
MCHC: 33.2 g/dL (ref 32.0–36.0)
MCV: 88.5 fL (ref 79.3–98.0)
Monocytes Absolute: 1 10*3/uL — ABNORMAL HIGH (ref 0.1–0.9)
Monocytes Relative: 24 %
Neutro Abs: 1.9 10*3/uL (ref 1.5–6.5)
Neutrophils Relative %: 45 %
PLATELETS: 250 10*3/uL (ref 140–400)
RBC: 4.42 MIL/uL (ref 4.20–5.82)
RDW: 19 % — ABNORMAL HIGH (ref 11.0–14.6)
WBC: 4.2 10*3/uL (ref 4.0–10.3)

## 2017-08-21 LAB — MAGNESIUM: Magnesium: 2.5 mg/dL (ref 1.5–2.5)

## 2017-08-21 LAB — CEA (IN HOUSE-CHCC): CEA (CHCC-IN HOUSE): 13.78 ng/mL — AB (ref 0.00–5.00)

## 2017-08-21 MED ORDER — SODIUM CHLORIDE 0.9% FLUSH
10.0000 mL | INTRAVENOUS | Status: DC | PRN
  Administered 2017-08-21: 10 mL via INTRAVENOUS
  Filled 2017-08-21: qty 10

## 2017-08-21 MED ORDER — HEPARIN SOD (PORK) LOCK FLUSH 100 UNIT/ML IV SOLN
500.0000 [IU] | Freq: Once | INTRAVENOUS | Status: AC | PRN
  Administered 2017-08-21: 500 [IU] via INTRAVENOUS
  Filled 2017-08-21: qty 5

## 2017-08-21 NOTE — Progress Notes (Addendum)
[place]  [phone] [phone]  Clinic Follow up Note   Patient Care Team: Lujean Amel, MD as PCP - General (Family Medicine) Wilford Corner, MD as Consulting Physician (Gastroenterology) Jackolyn Confer, MD as Consulting Physician (General Surgery) Truitt Merle, MD as Consulting Physician (Hematology) Dixie Dials, MD as Consulting Physician (Cardiology) 08/21/2017  CHIEF COMPLAINT: F/u metastatic colon cancer  SUMMARY OF ONCOLOGIC HISTORY: Oncology History   T2, Colon cancer metastasized to lung [facility])   Staging form: Colon and Rectum, AJCC 7th Edition     Clinical stage from 03/30/2015: Stage Unknown ([place], N1, M1) - Unsigned       Colon cancer metastasized to lung [facility]) s/p laparoscopic assisted sigmoid colectomy 11/02/15   03/30/2015 Miscellaneous    Foundation one genomic testing showed TP53 mutation, MSI stable, low tumor mutation burden. Negative for K-ras, NRAS and BRAF      03/30/2015 Initial Biopsy    Sigmoid mass biopsy showed invasive adenocarcinoma. Cecal colon polyps showed tubular adenoma.      03/30/2015 Initial Diagnosis    Colon cancer      03/30/2015 Procedure    colonoscopy by Dr. Michail Sermon showed a fungating, infiltrative and ulcerated nonobstructing large mass in the sigmoid colon and at 20 cm proximal to the anus. The mass was partially circumferential no bleeding. A 10 mm polyps in the cecum was removed.      04/03/2015 Imaging    CT chest, abdomen and pelvis with contrast showed nodular masslike area of clinical worsening at rectosigmoid junction, tiny pericolonic lymph nodes, bilateral pulmonary nodules measuring about 1 cm.      04/12/2015 PET scan    Hypermetabolic colonic mass near rectosigmoid junction, tiny subcentimeter paracolonic lymph nodes. Bilateral pulmonary metastasis.      04/19/2015 Procedure    CT-guided lung nodule biopsy attempted, unsuccessful.      04/27/2015 - 09/14/2015 Chemotherapy     Oxaliplatin 130 mg/m on day 1, Capecitabine 234m q12hr, 2 weeks on and one week off (only received 7 days for first cycle), oxaliplatin held on cycle 5 and dose reduced to 104mm2, capecitabine reduced to 200035m12h D1-14, stopped per pt's request.       06/29/2015 - 10/19/2015 Chemotherapy     Panitumumab every 2 weeks, some cycles were postponed due to pt's request, stoppe per pt's request       09/18/2015 Imaging    Pulmonary nodules are less hypermetabolic, stable size. Stable hypermetabolic portal hepatis and abdominal peritoneal ligament lymph nodes. No other new lesions.       11/02/2015 Surgery    sigmoid colon segmental resection       11/02/2015 Pathology Results    Sigmoid colon segmental resection showed adenocarcinoma, grade 3,  T2, 3 out of 13 lymph nodes were positive, surgical margins were negative. LVI(-), perineural invasion negative      11/13/2015 Imaging    CT chest, abdomen and pelvis with contrast showed postsurgical changes, mild progression of pulmonary metastasis, measuring up to 15 mm in the right lower lobe.      11/29/2015 - 12/05/2015 Radiation Therapy    SBRT to 4 lung lesions in 3 sessions       04/25/2016 - 08/05/2016 Chemotherapy    FOLFIRI every 2 weeks, and Avastin added from cycle 3, 5-FU held since cycle 2 due to poor tolerance. Chemo was stopped after 3 months due to overall poor tolerance and pt's request to stop.       06/27/2016 Imaging  CT CAP 06/27/16 IMPRESSION: Status post partial left hemicolectomy. Progressive wall thickening involving the colon near the suture line, worrisome for residual tumor. Adjacent 7 mm short axis pericolonic lymph node, possibly reflecting a nodal metastasis. Radiation changes in the posterior right upper lobe and superior segment right lower lobe. Progressive pulmonary metastases bilaterally, measuring up to 13 mm, as above.      09/17/2016 Imaging    CT A/P/C w contrast  IMPRESSION: 1. Multifocal  pulmonary nodules are not significantly changed in size compared with the previous exam. 2. Stable appearance of radiation changes within the right midlung. 3. Stable to scratch set improved appearance of wall thickening involving the colon at the level of the suture line.       11/11/2016 Imaging    CT Chest WO Contrast 11/11/16: IMPRESSION: 1. Minimal enlargement of some pulmonary metastases. 2. Coronary artery calcification. 3. Hepatic steatosis.       02/10/2017 Imaging    CT CAP W Contrast 02/10/17 IMPRESSION: 1. Progressive metastatic disease to the thorax as demonstrated by increased size of numerous previously noted pulmonary nodules, what appears to be lymphangitic spread of disease developing in the right upper lobe and superior segment of the right lower lobe, and new 1.5 cm short axis prevascular lymph node. 2. No definite findings of metastatic disease in the abdomen or pelvis. 3. Multiple tiny nonobstructive calculi in the collecting systems of the kidneys bilaterally measuring 2-3 mm in size. No ureteral stones or findings of urinary tract obstruction are noted at this time. 4. Hepatic steatosis. 5. Aortic atherosclerosis, in addition to left anterior descending coronary artery disease. Please note that although the presence of coronary artery calcium documents the presence of coronary artery disease, the severity of this disease and any potential stenosis cannot be assessed on this non-gated CT examination. Assessment for potential risk factor modification, dietary therapy or pharmacologic therapy may be warranted, if clinically indicated. 6. There are calcifications of the aortic valve. Echocardiographic correlation for evaluation of potential valvular dysfunction may be warranted if clinically indicated.       02/27/2017 - 06/13/2017 Chemotherapy    Third-line chemotherapy Xeloda for 2 weeks on and 1 week off with Oxaliplatin every 3 weeks and Panitumumab every 2  weeks on a different day. He developed anaphylactic reaction to oxaliplatin, coded, oxaliplatin was subsequently stopped. He did not to tolerate Xeloda, plan to change to 5-FU bolus weekly, Held chemo since 03/20/17.   Restarted panitumumab every 2 weeks on 05/01/17 and ended on 06/13/17 due to disease progression        06/11/2017 Imaging    CT CAP W Contrast 06/11/17  IMPRESSION: 1. Continued marked interval progression of pulmonary metastatic disease with mediastinal and left hilar metastases evident. 2. Interval development of a 4.3 cm metastatic lesion in the dome of the liver. 3. Stable nonobstructing nephrolithiasis.      06/26/2017 -  Chemotherapy    Fourth-Line chemo leucovorin/5-fu bolus weekly with avastin every 3 weeks - leucovorin bolus starting 06/26/17        08/19/2017 Imaging    IMPRESSION: Chest Impression:  1. Interval decrease in size of rounded pulmonary nodules and consolidative mass lesions in LEFT upper lobe. 2. No new pulmonary nodule. 3. No mediastinal fat  Abdomen / Pelvis Impression:  1. Interval decrease in size enhancing lesion in the RIGHT hepatic lobe. 2. No new or progressive disease in liver. 3. No abdominal adenopathy peritoneal metastasis 4. Rectal anastomosis, stable.  CURRENT TREATMENT:  restarted panitumumab on 05/01/17 for every 2 weeks ended on 06/13/17 due to disease progression. Switch toleucovorin/5-fu with avastin and leucovorin bolus starting 06/26/17   INTERVAL HISTORY: Mr. Torbeck returns for follow up as scheduled prior to next cycle leucovorin/5FU bolus and avastin. He reports severe fatigue that has not recovered since last treatment; "my body has never felt this depressed for this long" stating he usually recovers in 5 days after treatment. Last week he could not get out of bed. This week he has slight improvement but still spends most of the day lying/resting. He has no energy or appetite. Taste is decreased;  he uses mouth wash and brushes his teeth frequently and before meals. Has lost more weight unintentionally. He had 1-2 episodes of nausea with vomiting daily last week, this has recovered, no n/v this week. Zofran, compazine, or ativan do not work for him. He had increased diarrhea recently that improved with lomotil. No diarrhea in >48 hours. Denies pain, cough, dyspnea, fever, or chills. He has mild hoarseness without sore throat, no GERD. Has some congestions but attributes to allergies. He does not want treatment today.   REVIEW OF SYSTEMS:   Constitutional: Denies fevers, chills (+) severe fatigue (+) abnormal weight loss (+) lacks appetite (+) decreased taste  Eyes: Denies blurriness of vision Ears, nose, mouth, throat, and face: Denies mucositis or sore throat (+) hoarseness  Respiratory: Denies cough, dyspnea or wheezes Cardiovascular: Denies palpitation, chest discomfort or lower extremity swelling Gastrointestinal:  Denies constipation, heartburn, abdominal pain, GI bleeding, or change in bowel habits (+) intermittent nausea with vomiting 1-2 x per day last week, resolved now (+) diarrhea last week, resolved with lomotil, none in >48 hours Skin: Denies abnormal skin rashes Lymphatics: Denies new lymphadenopathy or easy bruising Neurological:Denies numbness, tingling or new weaknesses Behavioral/Psych: Mood is stable, no new changes  All other systems were reviewed with the patient and are negative.  MEDICAL HISTORY:  Past Medical History:  Diagnosis Date  . Anxiety    situational due to cancer diagnosis  . Cancer [facility]) 2017   colon-chemo 09/22/15 now surgery  . Hypercholesteremia   . Kidney calculi     SURGICAL HISTORY: Past Surgical History:  Procedure Laterality Date  . COLONOSCOPY  03/30/15  . IR CV LINE INJECTION  04/09/2017  . IR FLUORO GUIDE PORT INSERTION LEFT  06/26/2017  . IR FLUORO GUIDE PORT INSERTION RIGHT  04/15/2017  . IR REMOVAL TUN ACCESS W/ PORT W/O FL MOD SED   04/15/2017  . IR REMOVAL TUN ACCESS W/ PORT W/O FL MOD SED  06/26/2017  . IR US GUIDE VASC ACCESS LEFT  06/26/2017  . IR US GUIDE VASC ACCESS RIGHT  04/15/2017  . LAPAROSCOPIC PARTIAL COLECTOMY N/A 11/02/2015   Procedure: LAPAROSCOPIC ASSISTED SIGMOID COLECTOMY;  Surgeon: Jackolyn Confer, MD;  Location: [facility];  Service: General;  Laterality: N/A;  . LEFT HEART CATH AND CORONARY ANGIOGRAPHY N/A 03/21/2017   Procedure: LEFT HEART CATH AND CORONARY ANGIOGRAPHY;  Surgeon: Dixie Dials, MD;  Location: [place] CV LAB;  Service: Cardiovascular;  Laterality: N/A;  . PORTACATH PLACEMENT N/A 05/08/2016   Procedure: INSERTION PORT-A-CATH;  Surgeon: Jackolyn Confer, MD;  Location: [facility];  Service: General;  Laterality: N/A;    I have reviewed the social history and family history with the patient and they are unchanged from previous note.  ALLERGIES:  is allergic to oxaliplatin.  MEDICATIONS:  Current Outpatient Medications  Medication Sig Dispense Refill  . albuterol (  VENTOLIN HFA) 108 (90 Base) MCG/ACT inhaler Inhale 1 puff into the lungs daily as needed for wheezing or shortness of breath. 10 PUFFS DAILY FOR WHEEZING AND SHORTNESS OF BREATH    . clindamycin (CLINDAGEL) 1 % gel Apply 2 (two) times daily topically. 30 g 0  . diphenoxylate-atropine (LOMOTIL) 2.5-0.025 MG tablet Take 1-2 tablets by mouth 4 (four) times daily as needed for diarrhea or loose stools. 40 tablet 2  . Fluticasone-Salmeterol (ADVAIR DISKUS) 250-50 MCG/DOSE AEPB Inhale 1 puff into the lungs 2 (two) times daily. (Patient taking differently: Inhale 1 puff into the lungs daily. ) 180 each 1  . HYDROcodone-acetaminophen (NORCO/VICODIN) 5-325 MG tablet Take 1 tablet by mouth every 6 (six) hours as needed for moderate pain or severe pain. 30 tablet 0  . Hydrocodone-Homatropine 5-1.5 MG TABS Take 1 tablet by mouth every 6 (six) hours as needed. 60 each 0  . mometasone (ELOCON) 0.1 % cream Apply 1 application topically daily. 45 g 2    . diltiazem (CARDIZEM) 30 MG tablet Take 30 mg by mouth 3 (three) times daily.    Marland Kitchen LORazepam (ATIVAN) 0.5 MG tablet Take 1 tablet (0.5 mg total) by mouth every 6 (six) hours as needed (Nausea or vomiting). 30 tablet 0  . metoprolol tartrate (LOPRESSOR) 25 MG tablet Take 1 tablet (25 mg total) by mouth 2 (two) times daily. 60 tablet 3  . ondansetron (ZOFRAN) 8 MG tablet Take 1 tablet (8 mg total) by mouth 2 (two) times daily as needed (Nausea or vomiting). 30 tablet 1  . prochlorperazine (COMPAZINE) 10 MG tablet Take 1 tablet (10 mg total) by mouth every 6 (six) hours as needed (Nausea or vomiting). 30 tablet 1   Current Facility-Administered Medications  Medication Dose Route Frequency Provider Last Rate Last Dose  . sodium chloride flush (NS) 0.9 % injection 10 mL  10 mL Intravenous PRN Truitt Merle, MD   10 mL at 08/21/17 1118    PHYSICAL EXAMINATION: ECOG PERFORMANCE STATUS: 3 - Symptomatic, >50% confined to bed  Vitals:   08/21/17 1014  BP: (!) 131/94  Pulse: (!) 109  Resp: 17  Temp: 97.6 F (36.4 C)  SpO2: 100%   Filed Weights   08/21/17 1014  Weight: 192 lb 9.6 oz (87.4 kg)    GENERAL:alert, no distress and comfortable SKIN: skin color, texture, turgor are normal, no rashes or significant lesions (+) hands are dry with hyperpigmentation EYES: normal, Conjunctiva are pink and non-injected, sclera clear OROPHARYNX:no exudate, no erythema and lips, buccal mucosa, and tongue normal  NECK: supple, thyroid normal size, non-tender, without nodularity LYMPH:  no palpable cervical, supraclavicular, axillary, or inguinal lymphadenopathy LUNGS: clear to auscultation bilaterally with normal breathing effort HEART: regular rate & rhythm and no murmurs and no lower extremity edema ABDOMEN:abdomen soft, non-tender and normal bowel sounds. No hepatomegaly  Musculoskeletal:no cyanosis of digits and no clubbing  NEURO: alert & oriented x 3 with fluent speech, no focal motor/sensory  deficits PAC without erythema   LABORATORY DATA:  I have reviewed the data as listed CBC Latest Ref Rng & Units 08/21/2017 08/07/2017 07/31/2017  WBC 4.0 - 10.3 K/uL 4.2 5.8 5.4  Hemoglobin 13.0 - 17.1 g/dL 13.0 12.5(L) 13.1  Hematocrit 38.4 - 49.9 % 39.1 37.5(L) 38.7  Platelets 140 - 400 K/uL 250 195 185     CMP Latest Ref Rng & Units 08/21/2017 08/07/2017 07/31/2017  Glucose 70 - 140 mg/dL 100 105 111  BUN 7 - 26 mg/dL 10 12  12  Creatinine 0.70 - 1.30 mg/dL 0.83 0.84 0.88  Sodium 136 - 145 mmol/L 137 137 139  Potassium 3.5 - 5.1 mmol/L 4.8 4.0 3.9  Chloride 98 - 109 mmol/L 105 102 105  CO2 22 - 29 mmol/L _0 Calcium 8.4 - 10.4 mg/dL 8.9 8.9 8.8  Total Protein 6.4 - 8.3 g/dL 6.7 6.5 6.5  Total Bilirubin 0.2 - 1.2 mg/dL 1.2 1.1 0.7  Alkaline Phos 40 - 150 U/L 112 104 92  AST 5 - 34 U/L 30 32 36(H)  ALT 0 - 55 U/L 60(H) 95(H) 62(H)    CEA:  04/20/2016: <0.5 06/08/2015: <0.5 09/07/2015: <0.5 11/15/2015: 0.9 02/16/2016: <1.0  04/25/2016: 1.6 05/23/16: <1.00 06/20/2016: 1.05 07/22/2016: <1.00 09/17/16: <1.00 11/14/16: 1.60 02/10/17: 5.94 03/20/2017: 4.92 05/01/17: 13.29 05/30/17: 19.94 06/13/17: 27.88 07/10/17: 41.26 08/07/17: 25.45 08/21/17: 13.78   PATHOLOGY REPORT  Diagnosis 11/02/2015 1. Colon, segmental resection for tumor, sigmoid ADENOCARCINOMA OF THE SIGMOID COLON (2.0 CM), GRADE 3 THE TUMOR INVADES MUSCULARIS PROPRIA MARGINS OF RESECTION ARE NEGATIVE METASTATIC ADENOCARCINOMA IN THREE OF THIRTEEN LYMPH NODES (3/13) 2. Colon, resection margin (donut), distal sigmoid BENIGN COLONIC TISSUE Microscopic Comment 1. COLON AND RECTUM (INCLUDING TRANS-ANAL RESECTION): Specimen: Sigmoid Procedure: Segmental resection Tumor site: sigmoid Specimen integrity: Intact Macroscopic intactness of mesorectum: Not applicable: x Complete: NA Near complete: NA Incomplete: NA Cannot be determined (specify): NA Macroscopic tumor perforation: Muscularis Invasive  tumor: Maximum size: 2.0 cm Histologic type(s): Adenocarcinoma Histologic grade and differentiation: G3 G1: well differentiated/low grade G2: moderately differentiated/low grade G3: poorly differentiated/high grade G4: undifferentiated/high grade Type of polyp in which invasive carcinoma arose: Tubular adenoma Microscopic extension of invasive tumor: Muscularis propria Lymph-Vascular invasion: Negative Peri-neural invasion: Negative Tumor deposit(s) (discontinuous extramural extension): Negative Resection margins: Proximal margin: Negative Distal margin: Negative Circumferential (radial) (posterior ascending, posterior descending; lateral and posterior mid-rectum; and entire lower 1/3 rectum):Negative Mesenteric margin (sigmoid and transverse): Negative Distance closest margin (if all above margins negative): 3.5 cm Trans-anal resection margins only: Deep margin: NA Mucosal Margin: NA Distance closest mucosal margin (if negative): NA Treatment effect (neo-adjuvant therapy): Partial Additional polyp(s): None Non-neoplastic findings: unremarkable Lymph nodes: number examined 13; number positive: 3 Pathologic Staging: T2, N1b, M1a      RADIOGRAPHIC STUDIES: I have personally reviewed the radiological images as listed and agreed with the findings in the report. No results found.   ASSESSMENT & PLAN: 57 y.o.male, without significant past medical history except kidney stone, presented with intermittent bloody stool for 2 years, and colonoscopy showed a large sigmoid colon mass, CT scan showed multiple (at least 4) nodules in bilateral lungs, measuring about 1 cm.  1. Sigmoid colon adenocarcinoma, pT2N1bM1, stage IV with lung mets, KRA/NRAS wild type, MSI-stable -Mr. Gali appears stable. He completed cycle 1 of [place] regimen on 08/07/17, which includes 6 weekly doses of leucovorin and 5FU bolus with 1 week off; he did not tolerate well with profound fatigue and weight  loss. He has not yet recovered today and wants to delay another 1 week.  -We reviewed CT CAP in detail which shows interval decrease in size of pulmonary nodules in left upper lobe and no new pulmonary nodules as well as interval decrease in size of enhancing right hepatic lobe lesion; no new or progressive liver disease and no abdominal adenopathy or peritoneal disease.  -CEA is trending down, 13.78 today. CBC and CMP unremarkable.  -I encouraged him to eat high protein and high calorie foods to gain weight plus  nutritional supplements. I reviewed potential benefits of short course of steroids to improve energy and appetite, he declined today -Will delay treatment for another week, when he recovers, treatment will consist of 5FU/leucovorin bolus 3 weeks on and 1 week off regimen in addition to q2 week avastin -Return in 1 week for f/u and consideration to begin next cycle  2. Cardiac arrest 03/20/17 -Secondary to oxaliplatin, drug was discontinued  3. Dry cough and dyspnea -Resolved on current chemo regimen  4. Obesity -He has continued weight loss, 19 pounds in 2 weeks. I encouraged him to eat more, especially high calorie and high protein foods plus nutritional supplements. I will refer him to our dietician. I reviewed possible benefits of marinol vs mirtazapine for his appetite, he declined   5. Anxiety, emotional support We again encouraged him to discuss his diagnosis with wife and family. He is followed by Mervin Hack for ongoing emotional support.   6. Left leg neuropathy -Denies neuropathy today  7. Goal of care discussion -We had lengthy discussion about his goal to get back to work by early March, he has to work 30 days before he can take another leave of absence through his employer. He experienced significant fatigue with cycle 1 weekly leuc/5FU bolus x6 weeks, he is not able to work in his current condition. He will take another week off, next cycle will be 3 weeks on and  1 week off.  8. Intermittent chest pain -Resolved  9. Elevated BP -He is followed by cardiology Dr. Doylene Canard, previously prescribed metoprolol and diltiazem, however he does not take medication consistently. He reportedly did not take meds this last week while fatigued with no appetite. I recommend he restart medication, BP 131/94 HR 109 today  PLAN: -Hold chemo this week -Return in 1 week for f/u and begin cycle 2 leucovorin/5FU bolus, continue q2 week avastin  All questions were answered. The patient knows to call the clinic with any problems, questions or concerns. No barriers to learning was detected. I spent 20 minutes counseling the patient face to face. The total time spent in the appointment was 25 minutes and more than 50% was on counseling and review of test results.     Alla Feeling, NP 08/21/17   Addendum   I have seen the patient, examined him. I agree with the assessment and and plan and have edited the notes.   Mr Macphee has been quite fatigued since his last dose chemo 2 weeks ago.  I reviewed his restaging CT scan in person, and discussed the findings with him.  He has had good partial response to the chemotherapy, and clinically his cough has resolved, dyspnea much improved, overall he has had a good response.  I think his fatigue is related to his chemo.  We will give him 1 more week break, and start chemotherapy 5-FU bolus and Avastin every 2 weeks (he is reluctant to take more frequent chemotherapy, due to his plan to return to work on March 1).  Discussed disease control versus quality of life, will try to balance.  He understands his disease is not curable at this stage.  Agrees to continue treatment.  Truitt Merle  08/21/2017

## 2017-08-22 ENCOUNTER — Telehealth: Payer: Self-pay | Admitting: Hematology

## 2017-08-22 NOTE — Telephone Encounter (Signed)
Scheduled appt per 2/14 los - Patient to get an updated schedule next visit.  

## 2017-08-28 ENCOUNTER — Other Ambulatory Visit: Payer: BLUE CROSS/BLUE SHIELD

## 2017-08-28 ENCOUNTER — Ambulatory Visit: Payer: BLUE CROSS/BLUE SHIELD

## 2017-08-28 ENCOUNTER — Ambulatory Visit: Payer: BLUE CROSS/BLUE SHIELD | Admitting: Hematology

## 2017-09-04 ENCOUNTER — Inpatient Hospital Stay: Payer: BLUE CROSS/BLUE SHIELD

## 2017-09-04 VITALS — BP 132/83 | HR 120 | Temp 98.2°F | Resp 17 | Ht 66.0 in | Wt 200.5 lb

## 2017-09-04 DIAGNOSIS — C189 Malignant neoplasm of colon, unspecified: Secondary | ICD-10-CM

## 2017-09-04 DIAGNOSIS — C7800 Secondary malignant neoplasm of unspecified lung: Principal | ICD-10-CM

## 2017-09-04 DIAGNOSIS — C7801 Secondary malignant neoplasm of right lung: Secondary | ICD-10-CM

## 2017-09-04 DIAGNOSIS — Z95828 Presence of other vascular implants and grafts: Secondary | ICD-10-CM

## 2017-09-04 DIAGNOSIS — C187 Malignant neoplasm of sigmoid colon: Secondary | ICD-10-CM | POA: Diagnosis not present

## 2017-09-04 LAB — COMPREHENSIVE METABOLIC PANEL
ALBUMIN: 3.3 g/dL — AB (ref 3.5–5.0)
ALT: 60 U/L — ABNORMAL HIGH (ref 0–55)
AST: 43 U/L — AB (ref 5–34)
Alkaline Phosphatase: 104 U/L (ref 40–150)
Anion gap: 10 (ref 3–11)
BUN: 4 mg/dL — AB (ref 7–26)
CHLORIDE: 106 mmol/L (ref 98–109)
CO2: 25 mmol/L (ref 22–29)
Calcium: 9.3 mg/dL (ref 8.4–10.4)
Creatinine, Ser: 0.75 mg/dL (ref 0.70–1.30)
GFR calc Af Amer: >60 mL/min (ref >60)
GFR calc non Af Amer: >60 mL/min (ref >60)
GLUCOSE: 133 mg/dL (ref 70–140)
Potassium: 3.9 mmol/L (ref 3.5–5.1)
Sodium: 141 mmol/L (ref 136–145)
Total Bilirubin: 0.7 mg/dL (ref 0.2–1.2)
Total Protein: 6.9 g/dL (ref 6.4–8.3)

## 2017-09-04 LAB — CBC WITH DIFFERENTIAL/PLATELET
BASOS ABS: 0.1 10*3/uL (ref 0.0–0.1)
Basophils Relative: 1 %
EOS ABS: 0.1 10*3/uL (ref 0.0–0.5)
Eosinophils Relative: 1 %
HCT: 38 % — ABNORMAL LOW (ref 38.4–49.9)
Hemoglobin: 12.7 g/dL — ABNORMAL LOW (ref 13.0–17.1)
LYMPHS PCT: 17 %
Lymphs Abs: 1.1 10*3/uL (ref 0.9–3.3)
MCH: 30.1 pg (ref 27.2–33.4)
MCHC: 33.5 g/dL (ref 32.0–36.0)
MCV: 89.9 fL (ref 79.3–98.0)
Monocytes Absolute: 0.7 10*3/uL (ref 0.1–0.9)
Monocytes Relative: 11 %
NEUTROS PCT: 70 %
Neutro Abs: 4.7 10*3/uL (ref 1.5–6.5)
PLATELETS: 194 10*3/uL (ref 140–400)
RBC: 4.22 MIL/uL (ref 4.20–5.82)
RDW: 21.5 % — ABNORMAL HIGH (ref 11.0–14.6)
WBC: 6.7 10*3/uL (ref 4.0–10.3)

## 2017-09-04 LAB — MAGNESIUM: MAGNESIUM: 2.3 mg/dL (ref 1.5–2.5)

## 2017-09-04 LAB — TOTAL PROTEIN, URINE DIPSTICK: Protein, ur: NEGATIVE mg/dL

## 2017-09-04 MED ORDER — DEXTROSE 5 % IV SOLN
500.0000 mg/m2 | Freq: Once | INTRAVENOUS | Status: AC
  Administered 2017-09-04: 1090 mg via INTRAVENOUS
  Filled 2017-09-04: qty 54.5

## 2017-09-04 MED ORDER — ONDANSETRON HCL 8 MG PO TABS
ORAL_TABLET | ORAL | Status: AC
  Filled 2017-09-04: qty 1

## 2017-09-04 MED ORDER — FLUOROURACIL CHEMO INJECTION 2.5 GM/50ML
600.0000 mg/m2 | Freq: Once | INTRAVENOUS | Status: AC
  Administered 2017-09-04: 1300 mg via INTRAVENOUS
  Filled 2017-09-04: qty 26

## 2017-09-04 MED ORDER — SODIUM CHLORIDE 0.9 % IV SOLN
5.0000 mg/kg | Freq: Once | INTRAVENOUS | Status: AC
  Administered 2017-09-04: 500 mg via INTRAVENOUS
  Filled 2017-09-04: qty 4

## 2017-09-04 MED ORDER — PROCHLORPERAZINE MALEATE 10 MG PO TABS
ORAL_TABLET | ORAL | Status: AC
  Filled 2017-09-04: qty 1

## 2017-09-04 MED ORDER — HEPARIN SOD (PORK) LOCK FLUSH 100 UNIT/ML IV SOLN
500.0000 [IU] | Freq: Once | INTRAVENOUS | Status: AC | PRN
  Administered 2017-09-04: 500 [IU]
  Filled 2017-09-04: qty 5

## 2017-09-04 MED ORDER — SODIUM CHLORIDE 0.9% FLUSH
10.0000 mL | INTRAVENOUS | Status: DC | PRN
  Filled 2017-09-04: qty 10

## 2017-09-04 MED ORDER — ONDANSETRON HCL 4 MG/2ML IJ SOLN
INTRAMUSCULAR | Status: AC
  Filled 2017-09-04: qty 4

## 2017-09-04 MED ORDER — SODIUM CHLORIDE 0.9% FLUSH
10.0000 mL | INTRAVENOUS | Status: DC | PRN
  Administered 2017-09-04: 10 mL
  Filled 2017-09-04: qty 10

## 2017-09-04 MED ORDER — ONDANSETRON HCL 4 MG/2ML IJ SOLN
8.0000 mg | Freq: Once | INTRAMUSCULAR | Status: AC
  Administered 2017-09-04: 8 mg via INTRAVENOUS

## 2017-09-04 MED ORDER — SODIUM CHLORIDE 0.9 % IV SOLN
Freq: Once | INTRAVENOUS | Status: AC
  Administered 2017-09-04: 13:00:00 via INTRAVENOUS

## 2017-09-04 MED ORDER — PROCHLORPERAZINE MALEATE 10 MG PO TABS
10.0000 mg | ORAL_TABLET | Freq: Once | ORAL | Status: AC
  Administered 2017-09-04: 10 mg via ORAL

## 2017-09-04 NOTE — Patient Instructions (Signed)
[facility] Discharge Instructions for Patients Receiving Chemotherapy  Today you received the following chemotherapy agents Leucovorin, Fluorouracil.  To help prevent nausea and vomiting after your treatment, we encourage you to take your nausea medication as prescribed.  If you develop nausea and vomiting that is not controlled by your nausea medication, call the clinic.   BELOW ARE SYMPTOMS THAT SHOULD BE REPORTED IMMEDIATELY:  *FEVER GREATER THAN 100.5 F  *CHILLS WITH OR WITHOUT FEVER  NAUSEA AND VOMITING THAT IS NOT CONTROLLED WITH YOUR NAUSEA MEDICATION  *UNUSUAL SHORTNESS OF BREATH  *UNUSUAL BRUISING OR BLEEDING  TENDERNESS IN MOUTH AND THROAT WITH OR WITHOUT PRESENCE OF ULCERS  *URINARY PROBLEMS  *BOWEL PROBLEMS  UNUSUAL RASH Items with * indicate a potential emergency and should be followed up as soon as possible.  Feel free to call the clinic should you have any questions or concerns. The clinic phone number is [phone].  Please show the CHEMO ALERT CARD at check-in to the Emergency Department and triage nurse.   

## 2017-09-10 NOTE — Progress Notes (Signed)
[place]  [phone] [phone]  Clinic Follow Up Note   Date of Service: 09/11/2017  CHIEF COMPLAINTS:  Follow Up metastatic colon cancer  Oncology History   T2, Colon cancer metastasized to lung [facility])   Staging form: Colon and Rectum, AJCC 7th Edition     Clinical stage from 03/30/2015: Stage Unknown ([place], N1, M1) - Unsigned       Colon cancer metastasized to lung [facility]) s/p laparoscopic assisted sigmoid colectomy 11/02/15   03/30/2015 Miscellaneous    Foundation one genomic testing showed TP53 mutation, MSI stable, low tumor mutation burden. Negative for K-ras, NRAS and BRAF      03/30/2015 Initial Biopsy    Sigmoid mass biopsy showed invasive adenocarcinoma. Cecal colon polyps showed tubular adenoma.      03/30/2015 Initial Diagnosis    Colon cancer      03/30/2015 Procedure    colonoscopy by Dr. Michail Sermon showed a fungating, infiltrative and ulcerated nonobstructing large mass in the sigmoid colon and at 20 cm proximal to the anus. The mass was partially circumferential no bleeding. A 10 mm polyps in the cecum was removed.      04/03/2015 Imaging    CT chest, abdomen and pelvis with contrast showed nodular masslike area of clinical worsening at rectosigmoid junction, tiny pericolonic lymph nodes, bilateral pulmonary nodules measuring about 1 cm.      04/12/2015 PET scan    Hypermetabolic colonic mass near rectosigmoid junction, tiny subcentimeter paracolonic lymph nodes. Bilateral pulmonary metastasis.      04/19/2015 Procedure    CT-guided lung nodule biopsy attempted, unsuccessful.      04/27/2015 - 09/14/2015 Chemotherapy    Oxaliplatin 130 mg/m on day 1, Capecitabine 2366m q12hr, 2 weeks on and one week off (only received 7 days for first cycle), oxaliplatin held on cycle 5 and dose reduced to 1018mm2, capecitabine reduced to 200062m12h D1-14, stopped per pt's request.       06/29/2015 - 10/19/2015 Chemotherapy     Panitumumab  every 2 weeks, some cycles were postponed due to pt's request, stoppe per pt's request       09/18/2015 Imaging    Pulmonary nodules are less hypermetabolic, stable size. Stable hypermetabolic portal hepatis and abdominal peritoneal ligament lymph nodes. No other new lesions.       11/02/2015 Surgery    sigmoid colon segmental resection       11/02/2015 Pathology Results    Sigmoid colon segmental resection showed adenocarcinoma, grade 3,  T2, 3 out of 13 lymph nodes were positive, surgical margins were negative. LVI(-), perineural invasion negative      11/13/2015 Imaging    CT chest, abdomen and pelvis with contrast showed postsurgical changes, mild progression of pulmonary metastasis, measuring up to 15 mm in the right lower lobe.      11/29/2015 - 12/05/2015 Radiation Therapy    SBRT to 4 lung lesions in 3 sessions       04/25/2016 - 08/05/2016 Chemotherapy    FOLFIRI every 2 weeks, and Avastin added from cycle 3, 5-FU held since cycle 2 due to poor tolerance. Chemo was stopped after 3 months due to overall poor tolerance and pt's request to stop.       06/27/2016 Imaging    CT CAP 06/27/16 IMPRESSION: Status post partial left hemicolectomy. Progressive wall thickening involving the colon near the suture line, worrisome for residual tumor. Adjacent 7 mm short axis pericolonic lymph node, possibly reflecting a nodal metastasis. Radiation changes  in the posterior right upper lobe and superior segment right lower lobe. Progressive pulmonary metastases bilaterally, measuring up to 13 mm, as above.      09/17/2016 Imaging    CT A/P/C w contrast  IMPRESSION: 1. Multifocal pulmonary nodules are not significantly changed in size compared with the previous exam. 2. Stable appearance of radiation changes within the right midlung. 3. Stable to scratch set improved appearance of wall thickening involving the colon at the level of the suture line.       11/11/2016 Imaging    CT Chest WO  Contrast 11/11/16: IMPRESSION: 1. Minimal enlargement of some pulmonary metastases. 2. Coronary artery calcification. 3. Hepatic steatosis.       02/10/2017 Imaging    CT CAP W Contrast 02/10/17 IMPRESSION: 1. Progressive metastatic disease to the thorax as demonstrated by increased size of numerous previously noted pulmonary nodules, what appears to be lymphangitic spread of disease developing in the right upper lobe and superior segment of the right lower lobe, and new 1.5 cm short axis prevascular lymph node. 2. No definite findings of metastatic disease in the abdomen or pelvis. 3. Multiple tiny nonobstructive calculi in the collecting systems of the kidneys bilaterally measuring 2-3 mm in size. No ureteral stones or findings of urinary tract obstruction are noted at this time. 4. Hepatic steatosis. 5. Aortic atherosclerosis, in addition to left anterior descending coronary artery disease. Please note that although the presence of coronary artery calcium documents the presence of coronary artery disease, the severity of this disease and any potential stenosis cannot be assessed on this non-gated CT examination. Assessment for potential risk factor modification, dietary therapy or pharmacologic therapy may be warranted, if clinically indicated. 6. There are calcifications of the aortic valve. Echocardiographic correlation for evaluation of potential valvular dysfunction may be warranted if clinically indicated.       02/27/2017 - 06/13/2017 Chemotherapy    Third-line chemotherapy Xeloda for 2 weeks on and 1 week off with Oxaliplatin every 3 weeks and Panitumumab every 2 weeks on a different day. He developed anaphylactic reaction to oxaliplatin, coded, oxaliplatin was subsequently stopped. He did not to tolerate Xeloda, plan to change to 5-FU bolus weekly, Held chemo since 03/20/17.   Restarted panitumumab every 2 weeks on 05/01/17 and ended on 06/13/17 due to disease progression         06/11/2017 Imaging    CT CAP W Contrast 06/11/17  IMPRESSION: 1. Continued marked interval progression of pulmonary metastatic disease with mediastinal and left hilar metastases evident. 2. Interval development of a 4.3 cm metastatic lesion in the dome of the liver. 3. Stable nonobstructing nephrolithiasis.      06/26/2017 -  Chemotherapy    Fourth-Line chemo leucovorin/5-fu bolus weekly with avastin every 3 weeks - leucovorin bolus starting 06/26/17        08/19/2017 Imaging    IMPRESSION: Chest Impression:  1. Interval decrease in size of rounded pulmonary nodules and consolidative mass lesions in LEFT upper lobe. 2. No new pulmonary nodule. 3. No mediastinal fat  Abdomen / Pelvis Impression:  1. Interval decrease in size enhancing lesion in the RIGHT hepatic lobe. 2. No new or progressive disease in liver. 3. No abdominal adenopathy peritoneal metastasis 4. Rectal anastomosis, stable.         HISTORY OF PRESENTING ILLNESS:  Bill Wright 57 y.o. male is here because of recently newly diagnosed colon cancer.  He has had intermittent bloody stool for 2 years, it has been mild, mixed with  stool, patient does not have any abdominal pain, constipation, change of his bowel habits, nausea, weight loss or other symptoms. He did not seek medical attention for this. He went to emergency room on 03/15/2015 for right flank pain, due to his kidney stone. CT scan incidentally found a 11 mm right lower lobe nodule and mucosal edema in the sigmoid colon. He saw his primary care physician, and was referred to GI Dr. Michail Sermon here at he underwent colonoscopy on 03/30/2015, which showed a fungating infiltrative and ulcerated nonobstructing large mass in the sigmoid colon, biopsy showed adenocarcinoma. CT chest abdomen and pelvis showed multiple lung nodules measuring about 1 cm. He was referred to surgeon Dr. Zella Richer, who referred patient to [place] for further workup of his lung  nodule and discuss chemotherapy.  He feels very well overall, denies any symptoms. 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 He never had screening colonoscopy prior the reason one, no significant past medical history, does not see doctors regularly.   CURRENT TREATMENT:  Fourth-Line chemo leucovorin/5-fu bolus weekly with avastin every 2 weeks, started 06/26/17. Will change to every other week starting 09/25/17.   INTERIM HISTORY  Jame returns for follow-up and 2nd cycle treatment. He presents to the clinic today notes he asked for an extension of his medical leave from work but would like to return by end of this month. He has loss some authority from work due to his absence. He needs recent medical records sent to his employer.   On review of symptoms, pt notes 50% improvement in his fatigue. He denies pain. He denies cough and his breathing is good. He overall feels normal except for his fatigue. Stairs effect him but walking long distance is fine. He notes "zingers" radiation up his leg and his leg will fall asleep. This happens everyday and will come and go. This does not effect his sleep much. Pt notes his chemo brain has improved. He denies diarrhea. He has lomotil to help. He notes he has been eating better even with his taste gone. He has gained weight.     MEDICAL HISTORY:  Past Medical History:  Diagnosis Date  . Anxiety    situational due to cancer diagnosis  . Cancer [facility]) 2017   colon-chemo 09/22/15 now surgery  . Hypercholesteremia   . Kidney calculi     SURGICAL HISTORY: Past Surgical History:  Procedure Laterality Date  . COLONOSCOPY  03/30/15  . IR CV LINE INJECTION  04/09/2017  . IR FLUORO GUIDE PORT INSERTION LEFT  06/26/2017  . IR FLUORO GUIDE PORT INSERTION RIGHT  04/15/2017  . IR REMOVAL TUN ACCESS W/ PORT W/O FL MOD SED  04/15/2017  . IR REMOVAL TUN ACCESS W/ PORT W/O FL MOD SED  06/26/2017  . IR US GUIDE VASC ACCESS LEFT   06/26/2017  . IR US GUIDE VASC ACCESS RIGHT  04/15/2017  . LAPAROSCOPIC PARTIAL COLECTOMY N/A 11/02/2015   Procedure: LAPAROSCOPIC ASSISTED SIGMOID COLECTOMY;  Surgeon: Jackolyn Confer, MD;  Location: [facility];  Service: General;  Laterality: N/A;  . LEFT HEART CATH AND CORONARY ANGIOGRAPHY N/A 03/21/2017   Procedure: LEFT HEART CATH AND CORONARY ANGIOGRAPHY;  Surgeon: Dixie Dials, MD;  Location: [place] [facility];  Service: Cardiovascular;  Laterality: N/A;  . PORTACATH PLACEMENT N/A 05/08/2016   Procedure: INSERTION PORT-A-CATH;  Surgeon: Jackolyn Confer, MD;  Location: [facility];  Service: General;  Laterality: N/A;    SOCIAL HISTORY: Social History  Social History  . Marital Status: Married    Spouse Name: N/A  . Number of Children: 3, age of 36, 18 and 11   . Years of Education: N/A   Occupational History  . Banker for [employer] [place]    Social History Main Topics  . Smoking status: Never Smoker   . Smokeless tobacco: Not on file  . Alcohol Use: No  . Drug Use: No  . Sexual Activity: Not on file   Other Topics Concern  . Not on file   Social History Narrative    FAMILY HISTORY: Family History  Problem Relation Age of Onset  . Breast cancer Mother 58       +rad and lymph node  . Diabetes Maternal Grandmother        leading to blindness  . Obesity Maternal Aunt   . Stroke Maternal Uncle 24    ALLERGIES:  is allergic to oxaliplatin.  MEDICATIONS:  Current Outpatient Medications  Medication Sig Dispense Refill  . albuterol (VENTOLIN HFA) 108 (90 Base) MCG/ACT inhaler Inhale 1 puff into the lungs daily as needed for wheezing or shortness of breath. 10 PUFFS DAILY FOR WHEEZING AND SHORTNESS OF BREATH    . clindamycin (CLINDAGEL) 1 % gel Apply 2 (two) times daily topically. 30 g 0  . diltiazem (CARDIZEM) 30 MG tablet Take 30 mg by mouth 3 (three) times daily.    . diphenoxylate-atropine (LOMOTIL) 2.5-0.025 MG tablet Take 1-2 tablets by mouth 4 (four) times daily as  needed for diarrhea or loose stools. 40 tablet 2  . Fluticasone-Salmeterol (ADVAIR DISKUS) 250-50 MCG/DOSE AEPB Inhale 1 puff into the lungs 2 (two) times daily. (Patient taking differently: Inhale 1 puff into the lungs daily. ) 180 each 1  . HYDROcodone-acetaminophen (NORCO/VICODIN) 5-325 MG tablet Take 1 tablet by mouth every 6 (six) hours as needed for moderate pain or severe pain. 30 tablet 0  . Hydrocodone-Homatropine 5-1.5 MG TABS Take 1 tablet by mouth every 6 (six) hours as needed. 60 each 0  . LORazepam (ATIVAN) 0.5 MG tablet Take 1 tablet (0.5 mg total) by mouth every 6 (six) hours as needed (Nausea or vomiting). 30 tablet 0  . metoprolol tartrate (LOPRESSOR) 25 MG tablet Take 1 tablet (25 mg total) by mouth 2 (two) times daily. 60 tablet 3  . mometasone (ELOCON) 0.1 % cream Apply 1 application topically daily. 45 g 2  . ondansetron (ZOFRAN) 8 MG tablet Take 1 tablet (8 mg total) by mouth 2 (two) times daily as needed (Nausea or vomiting). 30 tablet 1  . prochlorperazine (COMPAZINE) 10 MG tablet Take 1 tablet (10 mg total) by mouth every 6 (six) hours as needed (Nausea or vomiting). 30 tablet 1   No current facility-administered medications for this visit.    Facility-Administered Medications Ordered in Other Visits  Medication Dose Route Frequency Provider Last Rate Last Dose  . fluorouracil (ADRUCIL) chemo injection 1,300 mg  600 mg/m2 (Treatment Plan Recorded) Intravenous Once Truitt Merle, MD      . heparin lock flush 100 unit/mL  500 Units Intracatheter Once PRN Truitt Merle, MD      . leucovorin 1,090 mg in dextrose 5 % 250 mL infusion  500 mg/m2 (Treatment Plan Recorded) Intravenous Once Truitt Merle, MD 203 mL/hr at 09/11/17 1617 1,090 mg at 09/11/17 1617  . sodium chloride flush (NS) 0.9 % injection 10 mL  10 mL Intracatheter PRN Truitt Merle, MD        REVIEW OF SYSTEMS:  Constitutional: Denies fevers, chills or abnormal night sweats, no weight loss. (+) significant fatigue, improved   (+) loss of taste (+) adequate appetite and weight gain Eyes: Denies blurriness of vision, double vision or watery eyes Ears, nose, mouth, throat, and face: Denies mucositis or sore throat Respiratory: Denies wheezes   Cardiovascular: negative Gastrointestinal:  Denies heartburn   Lymphatics: Denies new lymphadenopathy or easy bruising Neurological:Denies numbness (+) Left leg neuropathy (+) chemo brain has improved.  MSK: normal Behavioral/Psych: Mood is stable, no new changes  All other systems were reviewed with the patient and are negative.  PHYSICAL EXAMINATION:  ECOG PERFORMANCE STATUS: 2 Vitals:   09/11/17 1336  BP: 131/79  Pulse: 96  Resp: 20  Temp: 98.5 F (36.9 C)  TempSrc: Oral  SpO2: 100%  Weight: 203 lb 11.2 oz (92.4 kg)  Height: 5' 6"  (1.676 m)    GENERAL:alert, no distress and comfortable SKIN: skin color, texture, turgor are normal. mild skin erythema on his cheeks. Diffuse acne-like skin rash on his face, scalp, neck and upper chest, no skin erythema or signs of infection.  EYES: normal, conjunctiva are pink and non-injected, sclera clear OROPHARYNX:no exudate, no erythema and lips, buccal mucosa, and tongue normal  NECK: supple, thyroid normal size, non-tender, without nodularity LYMPH:  no palpable lymphadenopathy in the cervical, axillary or inguinal LUNGS: clear to auscultation and percussion with normal breathing effort HEART: regular rate & rhythm and no murmurs and no lower extremity edema ABDOMEN:abdomen soft, non-tender and normal bowel sounds. The midline incision has healed well, no discharge or skin erythema  Musculoskeletal:no cyanosis of digits and no clubbing  PSYCH: alert & oriented x 3 with fluent speech NEURO: no focal motor/sensory deficits  LABORATORY DATA:  I have reviewed the data as listed CBC Latest Ref Rng & Units 09/11/2017 09/04/2017 08/21/2017  WBC 4.0 - 10.3 K/uL 9.4 6.7 4.2  Hemoglobin 13.0 - 17.1 g/dL 12.7(L) 12.7(L) 13.0    Hematocrit 38.4 - 49.9 % 39.0 38.0(L) 39.1  Platelets 140 - 400 K/uL 242 194 250   CMP Latest Ref Rng & Units 09/11/2017 09/04/2017 08/21/2017  Glucose 70 - 140 mg/dL 119 133 100  BUN 7 - 26 mg/dL 6(L) 4(L) 10  Creatinine 0.70 - 1.30 mg/dL 0.78 0.75 0.83  Sodium 136 - 145 mmol/L 138 141 137  Potassium 3.5 - 5.1 mmol/L 4.0 3.9 4.8  Chloride 98 - 109 mmol/L 105 106 105  CO2 22 - 29 mmol/L 24 25 24   Calcium 8.4 - 10.4 mg/dL 9.5 9.3 8.9  Total Protein 6.4 - 8.3 g/dL 7.0 6.9 6.7  Total Bilirubin 0.2 - 1.2 mg/dL 0.6 0.7 1.2  Alkaline Phos 40 - 150 U/L 84 104 112  AST 5 - 34 U/L 26 43(H) 30  ALT 0 - 55 U/L 40 60(H) 60(H)    CEA:  04/20/2016: <0.5 06/08/2015: <0.5 09/07/2015: <0.5 11/15/2015: 0.9 02/16/2016: <1.0  04/25/2016: 1.6 05/23/16: <1.00 06/20/2016: 1.05 07/22/2016: <1.00 09/17/16: <1.00 11/14/16: 1.60 02/10/17: 5.94 03/20/2017: 4.92 05/01/17: 13.29 05/30/17: 19.94 06/13/17: 27.88 07/10/17: 41.26  08/07/17: 25.45 08/21/17: 13.78    PATHOLOGY REPORT  Diagnosis 11/02/2015 1. Colon, segmental resection for tumor, sigmoid ADENOCARCINOMA OF THE SIGMOID COLON (2.0 CM), GRADE 3 THE TUMOR INVADES MUSCULARIS PROPRIA MARGINS OF RESECTION ARE NEGATIVE METASTATIC ADENOCARCINOMA IN THREE OF THIRTEEN LYMPH NODES (3/13) 2. Colon, resection margin (donut), distal sigmoid BENIGN COLONIC TISSUE Microscopic Comment 1. COLON AND RECTUM (INCLUDING TRANS-ANAL RESECTION): Specimen: Sigmoid Procedure: Segmental resection Tumor site: sigmoid Specimen integrity:  Intact Macroscopic intactness of mesorectum: Not applicable: x Complete: NA Near complete: NA Incomplete: NA Cannot be determined (specify): NA Macroscopic tumor perforation: Muscularis Invasive tumor: Maximum size: 2.0 cm Histologic type(s): Adenocarcinoma Histologic grade and differentiation: G3 G1: well differentiated/low grade G2: moderately differentiated/low grade G3: poorly differentiated/high grade G4:  undifferentiated/high grade Type of polyp in which invasive carcinoma arose: Tubular adenoma Microscopic extension of invasive tumor: Muscularis propria Lymph-Vascular invasion: Negative Peri-neural invasion: Negative Tumor deposit(s) (discontinuous extramural extension): Negative Resection margins: Proximal margin: Negative Distal margin: Negative Circumferential (radial) (posterior ascending, posterior descending; lateral and posterior mid-rectum; and entire lower 1/3 rectum):Negative Mesenteric margin (sigmoid and transverse): Negative Distance closest margin (if all above margins negative): 3.5 cm Trans-anal resection margins only: Deep margin: NA Mucosal Margin: NA Distance closest mucosal margin (if negative): NA Treatment effect (neo-adjuvant therapy): Partial Additional polyp(s): None Non-neoplastic findings: unremarkable Lymph nodes: number examined 13; number positive: 3 Pathologic Staging: T2, N1b, M1a     RADIOGRAPHIC STUDIES: I have personally reviewed the radiological images as listed and agreed with the findings in the report.  PET 04/12/2015 IMPRESSION: Hypermetabolic colonic mass near rectosigmoid junction, consistent with known primary colon carcinoma. Tiny sub-cm pericolonic lymph nodes in sigmoid mesocolon are too small to characterize by PET, but are suspicious for early lymph node metastases. Bilateral pulmonary metastases  PET 09/18/2015 IMPRESSION: 1. Hypermetabolic pulmonary metastases have enlarged slightly from 07/18/2015. 2. Hypermetabolic porta hepatis/abdominal peritoneal ligament lymph nodes, stable from 04/12/2015. 3. Increase in hypermetabolism associated with mesenteric haziness and nodularity. While a reactive phenomenon/panniculitis can create this in appearance, metastatic disease/lymphoproliferative disorder cannot be excluded. 4. Hepatic steatosis. 5. Bilateral nephrolithiasis.  CT chest, abdomen and pelvis with contrast  06/27/2016 IMPRESSION: Status post partial left hemicolectomy. Progressive wall thickening involving the colon near the suture line, worrisome for residual tumor. Adjacent 7 mm short axis pericolonic lymph node, possibly reflecting a nodal metastasis. Radiation changes in the posterior right upper lobe and superior segment right lower lobe. Progressive pulmonary metastases bilaterally, measuring up to 13 mm, as above.   CT A/P/C w contrast 09/17/16:  IMPRESSION: 1. Multifocal pulmonary nodules are not significantly changed in size compared with the previous exam. 2. Stable appearance of radiation changes within the right midlung. 3. Stable to scratch set improved appearance of wall thickening involving the colon at the level of the suture line.   CT Chest WO Contrast 11/11/16: IMPRESSION: 1. Minimal enlargement of some pulmonary metastases. 2. Coronary artery calcification. 3. Hepatic steatosis.   CT CAP W Contrast 02/10/17 IMPRESSION: 1. Progressive metastatic disease to the thorax as demonstrated by increased size of numerous previously noted pulmonary nodules, what appears to be lymphangitic spread of disease developing in the right upper lobe and superior segment of the right lower lobe, and new 1.5 cm short axis prevascular lymph node. 2. No definite findings of metastatic disease in the abdomen or pelvis. 3. Multiple tiny nonobstructive calculi in the collecting systems of the kidneys bilaterally measuring 2-3 mm in size. No ureteral stones or findings of urinary tract obstruction are noted at this time. 4. Hepatic steatosis. 5. Aortic atherosclerosis, in addition to left anterior descending coronary artery disease. Please note that although the presence of coronary artery calcium documents the presence of coronary artery disease, the severity of this disease and any potential stenosis cannot be assessed on this non-gated CT examination. Assessment for potential  risk factor modification, dietary therapy or pharmacologic therapy may be warranted, if clinically indicated. 6. There are calcifications of  the aortic valve. Echocardiographic correlation for evaluation of potential valvular dysfunction may be warranted if clinically indicated.   CT CAP W Contrast 06/11/17  IMPRESSION: 1. Continued marked interval progression of pulmonary metastatic disease with mediastinal and left hilar metastases evident. 2. Interval development of a 4.3 cm metastatic lesion in the dome of the liver. 3. Stable nonobstructing nephrolithiasis.  CT ANGIO CHEST PE 06/30/17  IMPRESSION: -No definite evidence of pulmonary embolus. -Stable aortopulmonary window adenopathy is noted. -Significantly increased subcarinal adenopathy is noted concerning for worsening metastatic disease. -Continued presence of left hilar mass or adenopathy is noted, which appears to be resulting in increased postobstructive atelectasis or pneumonia. -Stable pulmonary nodules or metastatic lesions are noted in both lower lobes. -Right hepatic metastatic lesion noted on prior exam is not well visualized currently.   ASSESSMENT & PLAN:  57 y.o. male, without significant past medical history except kidney stone, presented with intermittent bloody stool for 2 years, and colonoscopy showed a large sigmoid colon mass, CT scan showed multiple (at least 4) nodules in bilateral lungs, measuring about 1 cm.  1. Sigmoid colon adenocarcinoma, pT2N1bM1, stage IV with lung mets, KRA/NRAS wild type, MSI-stable -I previously reviewed his colonoscopy, initial CT scan findings and the biopsy results in great details with patient and his wife. -he received first line chemo FOLFOX and panitumumab, followed by hemicolectomy, SBRT to 4 lung nodules, and second line chemo irinotecan and avastin, chemotherapy was finally stopped due to his poor tolerance. -I previously reviewed his surgical pathology findings, which  showed a residual T2 primary tumor, 3 lymph nodes positive, grade 3 disease, certainly high risk disease.  -We previously discussed that his disease is likely incurable, and the goal of therapy is maximum disease control and prolong his life.  -- His tumor does not contain KRAS/NRAS or BRAF mutation, so he would benefit from EGFR antibody, Panitumumab was added on from cycle 4 but tolerated poorly dur to skin rash  -he has been on and off chemo and panitumumab due to poor tolerance overall  --Unfortunately he previously developed anaphylactic reaction to oxaliplatin with cardiac arrest on 03/20/2017 and was successfully resuscitated, chemotherapy was held -no more oxaliplatin due to cardiac arrest  -He had a long chemo break after the cardiac arrest  -We reviewed his CT CAP images from 06/11/17 which shows significant growth in his pulmonary metastatic lesions and a new liver met.  -He has started weekly 5-fu bolus/leucovorin [facility] regimen) and avastin on 07/04/17 -his dyspnea has significantly improved since he started 5-FU and Avastin, he tolerating well.  -We previously reviewed CT CAP in detail which shows interval decrease in size of pulmonary nodules in left upper lobe and no new pulmonary nodules as well as interval decrease in size of enhancing right hepatic lobe lesion; no new or progressive liver disease and no abdominal adenopathy or peritoneal disease.  -He clinically is doing much better also, cough and dyspnea has resolved.  However he has been very fatigued, likely secondary to chemotherapy and less physical activity lately. -Given he would like to return to work at the end of the month, we will change his treatment to every other week starting 09/25/17 -Labs reviewed today and adequate for 5-Fu and Leucovorin today  -F/u in 2 weeks   2. Cardiac arrest on 03/20/2017 -Secondary to oxaliplatin -He underwent cardiac catheterization, which was negative for coronary artery  disease -He will continue follow-up with his cardiologist Dr. Dorthy Cooler  -He is on metoprolol, continue to follow  cardiologist   3.  Fatigue -He has developed severe fatigue after we started weekly 5-FU chemo -Likely related to chemotherapy, less physical activity lately, and underlying malignancy -Fatigue has improved after chemo break.  I encouraged him to be more physically active, and he well.  4. Obesity  -I again encouraged him to eat healthy and exercise  -Given his lower appetite his has loss some weight. I recommend he work on his appetite.  -The benefits of Marinol and mirtazapine were previously reviewed with him, he declined at that time.  5. Anxiety  -He has been having palpitation, sweating episodes, after his cardiac arrest event. Possible anxiety attacks  - I suggested he try low dose Xanax to help with his anxiety, he refuses at this time -Anxiety has improved some since he started chemo again  -He is followed by Mervin Hack for ongoing emotional support.   6. Left leg neuropathy -Possibly related to chemotherapy, also not very typical  -I previously suggested him to take over-the-counter vitamin B complex -continues, but stable. I encouraged him to stay active.  7. Goal of care discussion  -We again discussed the incurable nature of his cancer, although his cancer seems to be more indolent -The patient understands the goal of care is palliative. -He is full code now   8. Elevated BP, likely secondary to Avastin -It was suggested to restart diltiazem and metoprolol and he was encouraged to follow up with his cardiologist.  -Will monitor his BP closely   PLAN -Will extend his FMLA, per pt request. He plans to return to week at the end of March  -Labs reviewed and adequate to proceed with 5-fu bolus and LV today  -We will change his chemo to every 2 weeks, due to his fatigue and plan for returning to work -Lab, flush, f/u and 5-fu bolus/LV and avastin in 2  and 4 weeks    I spent 20 minutes counseling the patient face to face. The total time spent in the appointment was 30 minutes and more than 50% was on counseling.  This document serves as a record of services personally performed by Truitt Merle, MD. It was created on her behalf by Joslyn Devon, a trained medical scribe. The creation of this record is based on the scribe's personal observations and the provider's statements to them.    I have reviewed the above documentation for accuracy and completeness, and I agree with the above.    Truitt Merle, MD 09/11/2017

## 2017-09-11 ENCOUNTER — Inpatient Hospital Stay (HOSPITAL_BASED_OUTPATIENT_CLINIC_OR_DEPARTMENT_OTHER): Payer: BLUE CROSS/BLUE SHIELD | Admitting: Hematology

## 2017-09-11 ENCOUNTER — Inpatient Hospital Stay: Payer: BLUE CROSS/BLUE SHIELD | Attending: Hematology

## 2017-09-11 ENCOUNTER — Inpatient Hospital Stay: Payer: BLUE CROSS/BLUE SHIELD

## 2017-09-11 ENCOUNTER — Encounter: Payer: Self-pay | Admitting: Hematology

## 2017-09-11 VITALS — BP 131/79 | HR 96 | Temp 98.5°F | Resp 20 | Ht 66.0 in | Wt 203.7 lb

## 2017-09-11 DIAGNOSIS — G5792 Unspecified mononeuropathy of left lower limb: Secondary | ICD-10-CM | POA: Diagnosis not present

## 2017-09-11 DIAGNOSIS — Z8674 Personal history of sudden cardiac arrest: Secondary | ICD-10-CM

## 2017-09-11 DIAGNOSIS — G629 Polyneuropathy, unspecified: Secondary | ICD-10-CM | POA: Diagnosis not present

## 2017-09-11 DIAGNOSIS — C187 Malignant neoplasm of sigmoid colon: Secondary | ICD-10-CM | POA: Insufficient documentation

## 2017-09-11 DIAGNOSIS — Z5112 Encounter for antineoplastic immunotherapy: Secondary | ICD-10-CM | POA: Insufficient documentation

## 2017-09-11 DIAGNOSIS — C7802 Secondary malignant neoplasm of left lung: Secondary | ICD-10-CM | POA: Insufficient documentation

## 2017-09-11 DIAGNOSIS — I10 Essential (primary) hypertension: Secondary | ICD-10-CM | POA: Insufficient documentation

## 2017-09-11 DIAGNOSIS — C7800 Secondary malignant neoplasm of unspecified lung: Principal | ICD-10-CM

## 2017-09-11 DIAGNOSIS — C7801 Secondary malignant neoplasm of right lung: Secondary | ICD-10-CM

## 2017-09-11 DIAGNOSIS — D72819 Decreased white blood cell count, unspecified: Secondary | ICD-10-CM | POA: Insufficient documentation

## 2017-09-11 DIAGNOSIS — R5383 Other fatigue: Secondary | ICD-10-CM | POA: Diagnosis not present

## 2017-09-11 DIAGNOSIS — Z5111 Encounter for antineoplastic chemotherapy: Secondary | ICD-10-CM | POA: Diagnosis present

## 2017-09-11 DIAGNOSIS — C189 Malignant neoplasm of colon, unspecified: Secondary | ICD-10-CM

## 2017-09-11 DIAGNOSIS — E669 Obesity, unspecified: Secondary | ICD-10-CM | POA: Diagnosis not present

## 2017-09-11 DIAGNOSIS — D649 Anemia, unspecified: Secondary | ICD-10-CM | POA: Diagnosis not present

## 2017-09-11 DIAGNOSIS — F419 Anxiety disorder, unspecified: Secondary | ICD-10-CM

## 2017-09-11 DIAGNOSIS — C787 Secondary malignant neoplasm of liver and intrahepatic bile duct: Secondary | ICD-10-CM | POA: Diagnosis not present

## 2017-09-11 DIAGNOSIS — Z95828 Presence of other vascular implants and grafts: Secondary | ICD-10-CM

## 2017-09-11 LAB — CBC WITH DIFFERENTIAL/PLATELET
Basophils Absolute: 0 10*3/uL (ref 0.0–0.1)
Basophils Relative: 0 %
EOS ABS: 0.1 10*3/uL (ref 0.0–0.5)
EOS PCT: 1 %
HCT: 39 % (ref 38.4–49.9)
Hemoglobin: 12.7 g/dL — ABNORMAL LOW (ref 13.0–17.1)
LYMPHS PCT: 15 %
Lymphs Abs: 1.4 10*3/uL (ref 0.9–3.3)
MCH: 30 pg (ref 27.2–33.4)
MCHC: 32.6 g/dL (ref 32.0–36.0)
MCV: 92.2 fL (ref 79.3–98.0)
MONO ABS: 1.1 10*3/uL — AB (ref 0.1–0.9)
Monocytes Relative: 12 %
Neutro Abs: 6.7 10*3/uL — ABNORMAL HIGH (ref 1.5–6.5)
Neutrophils Relative %: 72 %
PLATELETS: 242 10*3/uL (ref 140–400)
RBC: 4.23 MIL/uL (ref 4.20–5.82)
RDW: 18.8 % — AB (ref 11.0–14.6)
WBC: 9.4 10*3/uL (ref 4.0–10.3)

## 2017-09-11 LAB — COMPREHENSIVE METABOLIC PANEL
ALT: 40 U/L (ref 0–55)
AST: 26 U/L (ref 5–34)
Albumin: 3.4 g/dL — ABNORMAL LOW (ref 3.5–5.0)
Alkaline Phosphatase: 84 U/L (ref 40–150)
Anion gap: 9 (ref 3–11)
BUN: 6 mg/dL — ABNORMAL LOW (ref 7–26)
CHLORIDE: 105 mmol/L (ref 98–109)
CO2: 24 mmol/L (ref 22–29)
Calcium: 9.5 mg/dL (ref 8.4–10.4)
Creatinine, Ser: 0.78 mg/dL (ref 0.70–1.30)
Glucose, Bld: 119 mg/dL (ref 70–140)
POTASSIUM: 4 mmol/L (ref 3.5–5.1)
SODIUM: 138 mmol/L (ref 136–145)
Total Bilirubin: 0.6 mg/dL (ref 0.2–1.2)
Total Protein: 7 g/dL (ref 6.4–8.3)

## 2017-09-11 MED ORDER — PROCHLORPERAZINE MALEATE 10 MG PO TABS
ORAL_TABLET | ORAL | Status: AC
Start: 2017-09-11 — End: 2017-09-11
  Filled 2017-09-11: qty 1

## 2017-09-11 MED ORDER — SODIUM CHLORIDE 0.9% FLUSH
10.0000 mL | INTRAVENOUS | Status: DC | PRN
  Administered 2017-09-11: 10 mL
  Filled 2017-09-11: qty 10

## 2017-09-11 MED ORDER — PROCHLORPERAZINE MALEATE 10 MG PO TABS
10.0000 mg | ORAL_TABLET | Freq: Once | ORAL | Status: AC
  Administered 2017-09-11: 10 mg via ORAL

## 2017-09-11 MED ORDER — ALTEPLASE 2 MG IJ SOLR
2.0000 mg | Freq: Once | INTRAMUSCULAR | Status: DC | PRN
  Filled 2017-09-11: qty 2

## 2017-09-11 MED ORDER — ALTEPLASE 2 MG IJ SOLR
2.0000 mg | Freq: Once | INTRAMUSCULAR | Status: AC | PRN
  Administered 2017-09-11: 2 mg
  Filled 2017-09-11: qty 2

## 2017-09-11 MED ORDER — ONDANSETRON HCL 4 MG/2ML IJ SOLN
8.0000 mg | Freq: Once | INTRAMUSCULAR | Status: AC
  Administered 2017-09-11: 8 mg via INTRAVENOUS

## 2017-09-11 MED ORDER — ALTEPLASE 2 MG IJ SOLR
INTRAMUSCULAR | Status: AC
  Filled 2017-09-11: qty 2

## 2017-09-11 MED ORDER — ONDANSETRON HCL 4 MG/2ML IJ SOLN
INTRAMUSCULAR | Status: AC
  Filled 2017-09-11: qty 4

## 2017-09-11 MED ORDER — LEUCOVORIN CALCIUM INJECTION 350 MG
500.0000 mg/m2 | Freq: Once | INTRAMUSCULAR | Status: AC
  Administered 2017-09-11: 1090 mg via INTRAVENOUS
  Filled 2017-09-11: qty 54.5

## 2017-09-11 MED ORDER — FLUOROURACIL CHEMO INJECTION 2.5 GM/50ML
600.0000 mg/m2 | Freq: Once | INTRAVENOUS | Status: AC
  Administered 2017-09-11: 1300 mg via INTRAVENOUS
  Filled 2017-09-11: qty 26

## 2017-09-11 MED ORDER — SODIUM CHLORIDE 0.9% FLUSH
10.0000 mL | INTRAVENOUS | Status: DC | PRN
  Administered 2017-09-11: 10 mL via INTRAVENOUS
  Filled 2017-09-11: qty 10

## 2017-09-11 MED ORDER — ONDANSETRON HCL 8 MG PO TABS
ORAL_TABLET | ORAL | Status: AC
  Filled 2017-09-11: qty 1

## 2017-09-11 MED ORDER — SODIUM CHLORIDE 0.9 % IV SOLN
Freq: Once | INTRAVENOUS | Status: AC
  Administered 2017-09-11: 16:00:00 via INTRAVENOUS

## 2017-09-11 MED ORDER — HEPARIN SOD (PORK) LOCK FLUSH 100 UNIT/ML IV SOLN
500.0000 [IU] | Freq: Once | INTRAVENOUS | Status: AC | PRN
  Administered 2017-09-11: 500 [IU]
  Filled 2017-09-11: qty 5

## 2017-09-11 NOTE — Progress Notes (Signed)
Port flushed and repositioned several times with no blood return. Dr. Burr Medico desk nurse called Meredeth Ide) to administer CATHFLOW. Labs were drawn peripherally by Lab 3. Coretha Creswell LPN

## 2017-09-11 NOTE — Patient Instructions (Addendum)
[facility] Discharge Instructions for Patients Receiving Chemotherapy  Today you received the following chemotherapy agents: Leucovorin and Fluorouracil (Adrucil, 5-FU)  To help prevent nausea and vomiting after your treatment, we encourage you to take your nausea medication as directed.    If you develop nausea and vomiting that is not controlled by your nausea medication, call the clinic.   BELOW ARE SYMPTOMS THAT SHOULD BE REPORTED IMMEDIATELY:  *FEVER GREATER THAN 100.5 F  *CHILLS WITH OR WITHOUT FEVER  NAUSEA AND VOMITING THAT IS NOT CONTROLLED WITH YOUR NAUSEA MEDICATION  *UNUSUAL SHORTNESS OF BREATH  *UNUSUAL BRUISING OR BLEEDING  TENDERNESS IN MOUTH AND THROAT WITH OR WITHOUT PRESENCE OF ULCERS  *URINARY PROBLEMS  *BOWEL PROBLEMS  UNUSUAL RASH Items with * indicate a potential emergency and should be followed up as soon as possible.  Feel free to call the clinic should you have any questions or concerns. The clinic phone number is [phone].  Please show the CHEMO ALERT CARD at check-in to the Emergency Department and triage nurse.   

## 2017-09-25 ENCOUNTER — Inpatient Hospital Stay (HOSPITAL_BASED_OUTPATIENT_CLINIC_OR_DEPARTMENT_OTHER): Payer: BLUE CROSS/BLUE SHIELD | Admitting: Nurse Practitioner

## 2017-09-25 ENCOUNTER — Encounter: Payer: Self-pay | Admitting: Nurse Practitioner

## 2017-09-25 ENCOUNTER — Inpatient Hospital Stay: Payer: BLUE CROSS/BLUE SHIELD

## 2017-09-25 ENCOUNTER — Ambulatory Visit: Payer: BLUE CROSS/BLUE SHIELD

## 2017-09-25 VITALS — BP 130/80 | HR 84 | Temp 98.3°F | Resp 18 | Ht 66.0 in | Wt 204.5 lb

## 2017-09-25 DIAGNOSIS — G629 Polyneuropathy, unspecified: Secondary | ICD-10-CM

## 2017-09-25 DIAGNOSIS — C189 Malignant neoplasm of colon, unspecified: Secondary | ICD-10-CM

## 2017-09-25 DIAGNOSIS — Z5111 Encounter for antineoplastic chemotherapy: Secondary | ICD-10-CM

## 2017-09-25 DIAGNOSIS — Z8674 Personal history of sudden cardiac arrest: Secondary | ICD-10-CM

## 2017-09-25 DIAGNOSIS — C7800 Secondary malignant neoplasm of unspecified lung: Principal | ICD-10-CM

## 2017-09-25 DIAGNOSIS — R530 Neoplastic (malignant) related fatigue: Secondary | ICD-10-CM

## 2017-09-25 DIAGNOSIS — D72819 Decreased white blood cell count, unspecified: Secondary | ICD-10-CM

## 2017-09-25 DIAGNOSIS — E669 Obesity, unspecified: Secondary | ICD-10-CM

## 2017-09-25 DIAGNOSIS — D649 Anemia, unspecified: Secondary | ICD-10-CM | POA: Diagnosis not present

## 2017-09-25 DIAGNOSIS — C187 Malignant neoplasm of sigmoid colon: Secondary | ICD-10-CM | POA: Diagnosis not present

## 2017-09-25 DIAGNOSIS — Z95828 Presence of other vascular implants and grafts: Secondary | ICD-10-CM

## 2017-09-25 DIAGNOSIS — C7801 Secondary malignant neoplasm of right lung: Secondary | ICD-10-CM

## 2017-09-25 LAB — CBC WITH DIFFERENTIAL/PLATELET
BASOS ABS: 0 10*3/uL (ref 0.0–0.1)
BASOS PCT: 1 %
EOS ABS: 0.1 10*3/uL (ref 0.0–0.5)
EOS PCT: 3 %
HCT: 37.9 % — ABNORMAL LOW (ref 38.4–49.9)
HEMOGLOBIN: 12.4 g/dL — AB (ref 13.0–17.1)
Lymphocytes Relative: 25 %
Lymphs Abs: 0.9 10*3/uL (ref 0.9–3.3)
MCH: 31.4 pg (ref 27.2–33.4)
MCHC: 32.7 g/dL (ref 32.0–36.0)
MCV: 95.9 fL (ref 79.3–98.0)
Monocytes Absolute: 0.5 10*3/uL (ref 0.1–0.9)
Monocytes Relative: 15 %
NEUTROS PCT: 56 %
Neutro Abs: 2.1 10*3/uL (ref 1.5–6.5)
PLATELETS: 186 10*3/uL (ref 140–400)
RBC: 3.95 MIL/uL — AB (ref 4.20–5.82)
RDW: 18.9 % — ABNORMAL HIGH (ref 11.0–14.6)
WBC: 3.7 10*3/uL — AB (ref 4.0–10.3)

## 2017-09-25 LAB — COMPREHENSIVE METABOLIC PANEL
ALK PHOS: 81 U/L (ref 40–150)
ALT: 25 U/L (ref 0–55)
AST: 21 U/L (ref 5–34)
Albumin: 3.6 g/dL (ref 3.5–5.0)
Anion gap: 9 (ref 3–11)
BILIRUBIN TOTAL: 0.8 mg/dL (ref 0.2–1.2)
BUN: 6 mg/dL — ABNORMAL LOW (ref 7–26)
CALCIUM: 9.4 mg/dL (ref 8.4–10.4)
CO2: 26 mmol/L (ref 22–29)
Chloride: 105 mmol/L (ref 98–109)
Creatinine, Ser: 0.79 mg/dL (ref 0.70–1.30)
Glucose, Bld: 134 mg/dL (ref 70–140)
Potassium: 4 mmol/L (ref 3.5–5.1)
Sodium: 140 mmol/L (ref 136–145)
TOTAL PROTEIN: 7.1 g/dL (ref 6.4–8.3)

## 2017-09-25 LAB — CEA (IN HOUSE-CHCC): CEA (CHCC-IN HOUSE): 13.47 ng/mL — AB (ref 0.00–5.00)

## 2017-09-25 MED ORDER — SODIUM CHLORIDE 0.9 % IV SOLN: Freq: Once | INTRAVENOUS | Status: DC

## 2017-09-25 MED ORDER — FLUOROURACIL CHEMO INJECTION 2.5 GM/50ML
600.0000 mg/m2 | Freq: Once | INTRAVENOUS | Status: AC
  Administered 2017-09-25: 1300 mg via INTRAVENOUS
  Filled 2017-09-25: qty 26

## 2017-09-25 MED ORDER — ONDANSETRON HCL 4 MG/2ML IJ SOLN
INTRAMUSCULAR | Status: AC
  Filled 2017-09-25: qty 4

## 2017-09-25 MED ORDER — SODIUM CHLORIDE 0.9% FLUSH
10.0000 mL | INTRAVENOUS | Status: DC | PRN
  Administered 2017-09-25: 10 mL
  Filled 2017-09-25: qty 10

## 2017-09-25 MED ORDER — ONDANSETRON HCL 4 MG/2ML IJ SOLN
8.0000 mg | Freq: Once | INTRAMUSCULAR | Status: AC
  Administered 2017-09-25: 8 mg via INTRAVENOUS

## 2017-09-25 MED ORDER — HEPARIN SOD (PORK) LOCK FLUSH 100 UNIT/ML IV SOLN
500.0000 [IU] | Freq: Once | INTRAVENOUS | Status: AC | PRN
  Administered 2017-09-25: 500 [IU]
  Filled 2017-09-25: qty 5

## 2017-09-25 MED ORDER — PROCHLORPERAZINE MALEATE 10 MG PO TABS
ORAL_TABLET | ORAL | Status: AC
  Filled 2017-09-25: qty 1

## 2017-09-25 MED ORDER — LEUCOVORIN CALCIUM INJECTION 350 MG
500.0000 mg/m2 | Freq: Once | INTRAMUSCULAR | Status: AC
  Administered 2017-09-25: 1090 mg via INTRAVENOUS
  Filled 2017-09-25: qty 54.5

## 2017-09-25 MED ORDER — SODIUM CHLORIDE 0.9% FLUSH
10.0000 mL | INTRAVENOUS | Status: DC | PRN
  Administered 2017-09-25: 10 mL via INTRAVENOUS
  Filled 2017-09-25: qty 10

## 2017-09-25 MED ORDER — SODIUM CHLORIDE 0.9 % IV SOLN
Freq: Once | INTRAVENOUS | Status: AC
  Administered 2017-09-25: 14:00:00 via INTRAVENOUS

## 2017-09-25 MED ORDER — SODIUM CHLORIDE 0.9 % IV SOLN
5.0000 mg/kg | Freq: Once | INTRAVENOUS | Status: AC
  Administered 2017-09-25: 500 mg via INTRAVENOUS
  Filled 2017-09-25: qty 16

## 2017-09-25 MED ORDER — PROCHLORPERAZINE MALEATE 10 MG PO TABS
10.0000 mg | ORAL_TABLET | Freq: Once | ORAL | Status: AC
  Administered 2017-09-25: 10 mg via ORAL

## 2017-09-25 NOTE — Progress Notes (Signed)
[place]  [phone] [phone]  Clinic Follow up Note   Patient Care Team: Lujean Amel, MD as PCP - General (Family Medicine) Wilford Corner, MD as Consulting Physician (Gastroenterology) Jackolyn Confer, MD as Consulting Physician (General Surgery) Truitt Merle, MD as Consulting Physician (Hematology) Dixie Dials, MD as Consulting Physician (Cardiology) 09/25/2017  SUMMARY OF ONCOLOGIC HISTORY: Oncology History   T2, Colon cancer metastasized to lung [facility])   Staging form: Colon and Rectum, AJCC 7th Edition     Clinical stage from 03/30/2015: Stage Unknown ([place], N1, M1) - Unsigned       Colon cancer metastasized to lung [facility]) s/p laparoscopic assisted sigmoid colectomy 11/02/15   03/30/2015 Miscellaneous    Foundation one genomic testing showed TP53 mutation, MSI stable, low tumor mutation burden. Negative for K-ras, NRAS and BRAF      03/30/2015 Initial Biopsy    Sigmoid mass biopsy showed invasive adenocarcinoma. Cecal colon polyps showed tubular adenoma.      03/30/2015 Initial Diagnosis    Colon cancer      03/30/2015 Procedure    colonoscopy by Dr. Michail Sermon showed a fungating, infiltrative and ulcerated nonobstructing large mass in the sigmoid colon and at 20 cm proximal to the anus. The mass was partially circumferential no bleeding. A 10 mm polyps in the cecum was removed.      04/03/2015 Imaging    CT chest, abdomen and pelvis with contrast showed nodular masslike area of clinical worsening at rectosigmoid junction, tiny pericolonic lymph nodes, bilateral pulmonary nodules measuring about 1 cm.      04/12/2015 PET scan    Hypermetabolic colonic mass near rectosigmoid junction, tiny subcentimeter paracolonic lymph nodes. Bilateral pulmonary metastasis.      04/19/2015 Procedure    CT-guided lung nodule biopsy attempted, unsuccessful.      04/27/2015 - 09/14/2015 Chemotherapy    Oxaliplatin 130 mg/m on day 1, Capecitabine  2350m q12hr, 2 weeks on and one week off (only received 7 days for first cycle), oxaliplatin held on cycle 5 and dose reduced to 1034mm2, capecitabine reduced to 200038m12h D1-14, stopped per pt's request.       06/29/2015 - 10/19/2015 Chemotherapy     Panitumumab every 2 weeks, some cycles were postponed due to pt's request, stoppe per pt's request       09/18/2015 Imaging    Pulmonary nodules are less hypermetabolic, stable size. Stable hypermetabolic portal hepatis and abdominal peritoneal ligament lymph nodes. No other new lesions.       11/02/2015 Surgery    sigmoid colon segmental resection       11/02/2015 Pathology Results    Sigmoid colon segmental resection showed adenocarcinoma, grade 3,  T2, 3 out of 13 lymph nodes were positive, surgical margins were negative. LVI(-), perineural invasion negative      11/13/2015 Imaging    CT chest, abdomen and pelvis with contrast showed postsurgical changes, mild progression of pulmonary metastasis, measuring up to 15 mm in the right lower lobe.      11/29/2015 - 12/05/2015 Radiation Therapy    SBRT to 4 lung lesions in 3 sessions       04/25/2016 - 08/05/2016 Chemotherapy    FOLFIRI every 2 weeks, and Avastin added from cycle 3, 5-FU held since cycle 2 due to poor tolerance. Chemo was stopped after 3 months due to overall poor tolerance and pt's request to stop.       06/27/2016 Imaging    CT CAP 06/27/16 IMPRESSION: Status  post partial left hemicolectomy. Progressive wall thickening involving the colon near the suture line, worrisome for residual tumor. Adjacent 7 mm short axis pericolonic lymph node, possibly reflecting a nodal metastasis. Radiation changes in the posterior right upper lobe and superior segment right lower lobe. Progressive pulmonary metastases bilaterally, measuring up to 13 mm, as above.      09/17/2016 Imaging    CT A/P/C w contrast  IMPRESSION: 1. Multifocal pulmonary nodules are not significantly changed  in size compared with the previous exam. 2. Stable appearance of radiation changes within the right midlung. 3. Stable to scratch set improved appearance of wall thickening involving the colon at the level of the suture line.       11/11/2016 Imaging    CT Chest WO Contrast 11/11/16: IMPRESSION: 1. Minimal enlargement of some pulmonary metastases. 2. Coronary artery calcification. 3. Hepatic steatosis.       02/10/2017 Imaging    CT CAP W Contrast 02/10/17 IMPRESSION: 1. Progressive metastatic disease to the thorax as demonstrated by increased size of numerous previously noted pulmonary nodules, what appears to be lymphangitic spread of disease developing in the right upper lobe and superior segment of the right lower lobe, and new 1.5 cm short axis prevascular lymph node. 2. No definite findings of metastatic disease in the abdomen or pelvis. 3. Multiple tiny nonobstructive calculi in the collecting systems of the kidneys bilaterally measuring 2-3 mm in size. No ureteral stones or findings of urinary tract obstruction are noted at this time. 4. Hepatic steatosis. 5. Aortic atherosclerosis, in addition to left anterior descending coronary artery disease. Please note that although the presence of coronary artery calcium documents the presence of coronary artery disease, the severity of this disease and any potential stenosis cannot be assessed on this non-gated CT examination. Assessment for potential risk factor modification, dietary therapy or pharmacologic therapy may be warranted, if clinically indicated. 6. There are calcifications of the aortic valve. Echocardiographic correlation for evaluation of potential valvular dysfunction may be warranted if clinically indicated.       02/27/2017 - 06/13/2017 Chemotherapy    Third-line chemotherapy Xeloda for 2 weeks on and 1 week off with Oxaliplatin every 3 weeks and Panitumumab every 2 weeks on a different day. He developed  anaphylactic reaction to oxaliplatin, coded, oxaliplatin was subsequently stopped. He did not to tolerate Xeloda, plan to change to 5-FU bolus weekly, Held chemo since 03/20/17.   Restarted panitumumab every 2 weeks on 05/01/17 and ended on 06/13/17 due to disease progression        06/11/2017 Imaging    CT CAP W Contrast 06/11/17  IMPRESSION: 1. Continued marked interval progression of pulmonary metastatic disease with mediastinal and left hilar metastases evident. 2. Interval development of a 4.3 cm metastatic lesion in the dome of the liver. 3. Stable nonobstructing nephrolithiasis.      06/26/2017 -  Chemotherapy    Fourth-Line chemo leucovorin/5-fu bolus weekly with avastin every 3 weeks - leucovorin bolus starting 06/26/17        08/19/2017 Imaging    IMPRESSION: Chest Impression:  1. Interval decrease in size of rounded pulmonary nodules and consolidative mass lesions in LEFT upper lobe. 2. No new pulmonary nodule. 3. No mediastinal fat  Abdomen / Pelvis Impression:  1. Interval decrease in size enhancing lesion in the RIGHT hepatic lobe. 2. No new or progressive disease in liver. 3. No abdominal adenopathy peritoneal metastasis 4. Rectal anastomosis, stable.       CURRENT TREATMENT:  Fourth-Line  chemo leucovorin/5-fu bolus weekly with avastin every 2 weeks, started 06/26/17. Will change to every other week starting 09/25/17.  INTERVAL HISTORY: Mr. Pantaleo returns for follow up as scheduled prior to next cycle 5FU/leucovorin and avastin; last treated 09/11/17. He continues to report low energy that has slightly improved with time off treatment. "Chemo fog" is lifting. Appetite is improved but cannot taste food, but continues to force himself to eat. Has gained weight. Has occasional dry cough; no chest pain, dyspnea, fever, or chills. He takes his cardiac and BP meds the week of treatment only. Having regular BM, no bleeding. Denies nausea or vomiting and does not  require anti-emetics. Denies pain. Mood is stable, denies anxiety or depression. He is starting new role at work 3/29 and wants to delay next cycle of treatment until he gets settled at work.   REVIEW OF SYSTEMS:   Constitutional: Denies fevers, chills or abnormal weight loss (+) fatigue, slightly improved (+) improving appetite (+) weight gain Eyes: Denies blurriness of vision Ears, nose, mouth, throat, and face: Denies mucositis or sore throat Respiratory: Denies dyspnea or wheezes (+) intermittent dry cough Cardiovascular: Denies palpitation, chest discomfort or lower extremity swelling Gastrointestinal:  Denies nausea, vomiting, constipation, diarrhea, hematochezia, abdominal pain, heartburn or change in bowel habits Skin: Denies abnormal skin rashes Lymphatics: Denies new lymphadenopathy or easy bruising Neurological:Denies numbness, tingling or new weaknesses (+) chemo "fog" is lifting  Behavioral/Psych: Mood is stable, no new changes  All other systems were reviewed with the patient and are negative.  MEDICAL HISTORY:  Past Medical History:  Diagnosis Date  . Anxiety    situational due to cancer diagnosis  . Cancer [facility]) 2017   colon-chemo 09/22/15 now surgery  . Hypercholesteremia   . Kidney calculi     SURGICAL HISTORY: Past Surgical History:  Procedure Laterality Date  . COLONOSCOPY  03/30/15  . IR CV LINE INJECTION  04/09/2017  . IR FLUORO GUIDE PORT INSERTION LEFT  06/26/2017  . IR FLUORO GUIDE PORT INSERTION RIGHT  04/15/2017  . IR REMOVAL TUN ACCESS W/ PORT W/O FL MOD SED  04/15/2017  . IR REMOVAL TUN ACCESS W/ PORT W/O FL MOD SED  06/26/2017  . IR US GUIDE VASC ACCESS LEFT  06/26/2017  . IR US GUIDE VASC ACCESS RIGHT  04/15/2017  . LAPAROSCOPIC PARTIAL COLECTOMY N/A 11/02/2015   Procedure: LAPAROSCOPIC ASSISTED SIGMOID COLECTOMY;  Surgeon: Jackolyn Confer, MD;  Location: [facility];  Service: General;  Laterality: N/A;  . LEFT HEART CATH AND CORONARY ANGIOGRAPHY N/A  03/21/2017   Procedure: LEFT HEART CATH AND CORONARY ANGIOGRAPHY;  Surgeon: Dixie Dials, MD;  Location: [place] [facility];  Service: Cardiovascular;  Laterality: N/A;  . PORTACATH PLACEMENT N/A 05/08/2016   Procedure: INSERTION PORT-A-CATH;  Surgeon: Jackolyn Confer, MD;  Location: [facility];  Service: General;  Laterality: N/A;    I have reviewed the social history and family history with the patient and they are unchanged from previous note.  ALLERGIES:  is allergic to oxaliplatin.  MEDICATIONS:  Current Outpatient Medications  Medication Sig Dispense Refill  . diltiazem (CARDIZEM) 30 MG tablet Take 30 mg by mouth 3 (three) times daily.    . metoprolol tartrate (LOPRESSOR) 25 MG tablet Take 1 tablet (25 mg total) by mouth 2 (two) times daily. 60 tablet 3  . albuterol (VENTOLIN HFA) 108 (90 Base) MCG/ACT inhaler Inhale 1 puff into the lungs daily as needed for wheezing or shortness of breath. 10 PUFFS DAILY FOR WHEEZING AND  SHORTNESS OF BREATH    . clindamycin (CLINDAGEL) 1 % gel Apply 2 (two) times daily topically. 30 g 0  . diphenoxylate-atropine (LOMOTIL) 2.5-0.025 MG tablet Take 1-2 tablets by mouth 4 (four) times daily as needed for diarrhea or loose stools. 40 tablet 2  . Fluticasone-Salmeterol (ADVAIR DISKUS) 250-50 MCG/DOSE AEPB Inhale 1 puff into the lungs 2 (two) times daily. (Patient taking differently: Inhale 1 puff into the lungs daily. ) 180 each 1  . HYDROcodone-acetaminophen (NORCO/VICODIN) 5-325 MG tablet Take 1 tablet by mouth every 6 (six) hours as needed for moderate pain or severe pain. 30 tablet 0  . Hydrocodone-Homatropine 5-1.5 MG TABS Take 1 tablet by mouth every 6 (six) hours as needed. 60 each 0  . LORazepam (ATIVAN) 0.5 MG tablet Take 1 tablet (0.5 mg total) by mouth every 6 (six) hours as needed (Nausea or vomiting). 30 tablet 0  . mometasone (ELOCON) 0.1 % cream Apply 1 application topically daily. 45 g 2  . ondansetron (ZOFRAN) 8 MG tablet Take 1 tablet (8 mg  total) by mouth 2 (two) times daily as needed (Nausea or vomiting). 30 tablet 1  . prochlorperazine (COMPAZINE) 10 MG tablet Take 1 tablet (10 mg total) by mouth every 6 (six) hours as needed (Nausea or vomiting). 30 tablet 1   No current facility-administered medications for this visit.     PHYSICAL EXAMINATION: ECOG PERFORMANCE STATUS: 2 - Symptomatic, <50% confined to bed  Vitals:   09/25/17 1321  BP: 130/80  Pulse: 84  Resp: 18  Temp: 98.3 F (36.8 C)  SpO2: 100%   Filed Weights   09/25/17 1321  Weight: 204 lb 8 oz (92.8 kg)    GENERAL:alert, no distress and comfortable SKIN: skin color, texture, turgor are normal, no rashes or significant lesions EYES: normal, Conjunctiva are pink and non-injected, sclera clear OROPHARYNX:no exudate, no erythema and lips, buccal mucosa, and tongue normal  LYMPH:  no palpable cervical, supraclavicular, axillary, or inguinal lymphadenopathy LUNGS: clear to auscultation bilaterally with normal breathing effort HEART: regular rate & rhythm and no murmurs and no lower extremity edema ABDOMEN:abdomen soft, non-tender and normal bowel sounds Musculoskeletal:no cyanosis of digits and no clubbing  NEURO: alert & oriented x 3 with fluent speech, no focal motor/sensory deficits PAC without erythema   LABORATORY DATA:  I have reviewed the data as listed CBC Latest Ref Rng & Units 09/25/2017 09/11/2017 09/04/2017  WBC 4.0 - 10.3 K/uL 3.7(L) 9.4 6.7  Hemoglobin 13.0 - 17.1 g/dL 12.4(L) 12.7(L) 12.7(L)  Hematocrit 38.4 - 49.9 % 37.9(L) 39.0 38.0(L)  Platelets 140 - 400 K/uL 186 242 194     CMP Latest Ref Rng & Units 09/25/2017 09/11/2017 09/04/2017  Glucose 70 - 140 mg/dL 134 119 133  BUN 7 - 26 mg/dL 6(L) 6(L) 4(L)  Creatinine 0.70 - 1.30 mg/dL 0.79 0.78 0.75  Sodium 136 - 145 mmol/L 140 138 141  Potassium 3.5 - 5.1 mmol/L 4.0 4.0 3.9  Chloride 98 - 109 mmol/L 105 105 106  CO2 22 - 29 mmol/L 26 24 25   Calcium 8.4 - 10.4 mg/dL 9.4 9.5 9.3  Total  Protein 6.4 - 8.3 g/dL 7.1 7.0 6.9  Total Bilirubin 0.2 - 1.2 mg/dL 0.8 0.6 0.7  Alkaline Phos 40 - 150 U/L 81 84 104  AST 5 - 34 U/L 21 26 43(H)  ALT 0 - 55 U/L 25 40 60(H)   CEA:  04/20/2016: <0.5 06/08/2015: <0.5 09/07/2015: <0.5 11/15/2015: 0.9 02/16/2016: <1.0  04/25/2016: 1.6  05/23/16: <1.00 06/20/2016: 1.05 07/22/2016: <1.00 09/17/16: <1.00 11/14/16: 1.60 02/10/17: 5.94 03/20/2017: 4.92 05/01/17: 13.29 05/30/17: 19.94 06/13/17: 27.88 07/10/17: 41.26  08/07/17: 25.45 08/21/17: 13.78 09/25/17 PENDING   PATHOLOGY REPORT  Diagnosis 11/02/2015 1. Colon, segmental resection for tumor, sigmoid ADENOCARCINOMA OF THE SIGMOID COLON (2.0 CM), GRADE 3 THE TUMOR INVADES MUSCULARIS PROPRIA MARGINS OF RESECTION ARE NEGATIVE METASTATIC ADENOCARCINOMA IN THREE OF THIRTEEN LYMPH NODES (3/13) 2. Colon, resection margin (donut), distal sigmoid BENIGN COLONIC TISSUE Microscopic Comment 1. COLON AND RECTUM (INCLUDING TRANS-ANAL RESECTION): Specimen: Sigmoid Procedure: Segmental resection Tumor site: sigmoid Specimen integrity: Intact Macroscopic intactness of mesorectum: Not applicable: x Complete: NA Near complete: NA Incomplete: NA Cannot be determined (specify): NA Macroscopic tumor perforation: Muscularis Invasive tumor: Maximum size: 2.0 cm Histologic type(s): Adenocarcinoma Histologic grade and differentiation: G3 G1: well differentiated/low grade G2: moderately differentiated/low grade G3: poorly differentiated/high grade G4: undifferentiated/high grade Type of polyp in which invasive carcinoma arose: Tubular adenoma Microscopic extension of invasive tumor: Muscularis propria Lymph-Vascular invasion: Negative Peri-neural invasion: Negative Tumor deposit(s) (discontinuous extramural extension): Negative Resection margins: Proximal margin: Negative Distal margin: Negative Circumferential (radial) (posterior ascending, posterior descending; lateral and posterior  mid-rectum; and entire lower 1/3 rectum):Negative Mesenteric margin (sigmoid and transverse): Negative Distance closest margin (if all above margins negative): 3.5 cm Trans-anal resection margins only: Deep margin: NA Mucosal Margin: NA Distance closest mucosal margin (if negative): NA Treatment effect (neo-adjuvant therapy): Partial Additional polyp(s): None Non-neoplastic findings: unremarkable Lymph nodes: number examined 13; number positive: 3 Pathologic Staging: T2, N1b, M1a        RADIOGRAPHIC STUDIES: I have personally reviewed the radiological images as listed and agreed with the findings in the report. No results found.   ASSESSMENT & PLAN: 57 y.o. male, without significant past medical history except kidney stone, presented with intermittent bloody stool for 2 years, and colonoscopy showed a large sigmoid colon mass, CT scan showed multiple (at least 4) nodules in bilateral lungs, measuring about 1 cm.  1. Sigmoid colon adenocarcinoma, pT2N1bM1, stage IV with lung mets, KRA/NRAS wild type, MSI-stable 2. Cardiac arrest 03/20/17, secondary to oxaliplatin 3. Fatigue 4. Obesity 5. Anxiety 6. Left leg neuropathy 7. Goals of care discussion - full code  8. Elevated BP, likely secondary to avastin  Mr. Reihl appears stable. He completed cycle 2 day 8 5FU/leucovorin on 09/11/17 and cycle 4 avastin on 2/28. He had significant fatigue with weekly dosing. Fatigue and appetite are improving since changing to every-other-week; he appears to be tolerating moderately well overall. VS and weight stable. BP Labs reviewed, has mild leukopenia and anemia, CMP unremarkable; labs adequate for treatment. Proceed with cycle 2 day 15 5FU/leucovorin and cycle 5 avastin.   He informs me he will not be coming in 2 weeks for treatment, he plans to wait until he's settled at work. I explained that he is at high risk for cancer progression if treatment is delayed. He understands. 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 I urged him to return 10/16/17, he agrees to consider it. I suggest increasing avastin to 7.5 mg/kg since it will be 3 weeks before his next dose, he refused increased dose. Will continue current regimen.   PLAN: -Labs reviewed, proceed with cycle 2 day 15 5FU/leucovorin and cycle 5 avastin -Return in 3 weeks for next cycle -Patient agrees to call NP if he does not want to come in 3 weeks  -I faxed funds transfer request form to [employer] and returned original  copy to patient today  All questions were answered. The patient knows to call the clinic with any problems, questions or concerns. No barriers to learning was detected.     Alla Feeling, NP 09/25/17

## 2017-09-25 NOTE — Patient Instructions (Signed)
[facility] Discharge Instructions for Patients Receiving Chemotherapy  Today you received the following chemotherapy agents Leucovorin,Adrucil, Avastin  To help prevent nausea and vomiting after your treatment, we encourage you to take your nausea medication as directed  If you develop nausea and vomiting that is not controlled by your nausea medication, call the clinic.   BELOW ARE SYMPTOMS THAT SHOULD BE REPORTED IMMEDIATELY:  *FEVER GREATER THAN 100.5 F  *CHILLS WITH OR WITHOUT FEVER  NAUSEA AND VOMITING THAT IS NOT CONTROLLED WITH YOUR NAUSEA MEDICATION  *UNUSUAL SHORTNESS OF BREATH  *UNUSUAL BRUISING OR BLEEDING  TENDERNESS IN MOUTH AND THROAT WITH OR WITHOUT PRESENCE OF ULCERS  *URINARY PROBLEMS  *BOWEL PROBLEMS  UNUSUAL RASH Items with * indicate a potential emergency and should be followed up as soon as possible.  Feel free to call the clinic should you have any questions or concerns. The clinic phone number is [phone].  Please show the CHEMO ALERT CARD at check-in to the Emergency Department and triage nurse.   

## 2017-09-26 ENCOUNTER — Telehealth: Payer: Self-pay | Admitting: Hematology

## 2017-09-26 NOTE — Telephone Encounter (Signed)
Appointments rescheduled per 3/21 los and patient was notified

## 2017-10-09 ENCOUNTER — Other Ambulatory Visit: Payer: BLUE CROSS/BLUE SHIELD

## 2017-10-09 ENCOUNTER — Ambulatory Visit: Payer: BLUE CROSS/BLUE SHIELD | Admitting: Hematology

## 2017-10-09 ENCOUNTER — Ambulatory Visit: Payer: BLUE CROSS/BLUE SHIELD

## 2017-10-16 ENCOUNTER — Encounter: Payer: Self-pay | Admitting: Nurse Practitioner

## 2017-10-16 ENCOUNTER — Inpatient Hospital Stay (HOSPITAL_BASED_OUTPATIENT_CLINIC_OR_DEPARTMENT_OTHER): Payer: BLUE CROSS/BLUE SHIELD | Admitting: Nurse Practitioner

## 2017-10-16 ENCOUNTER — Inpatient Hospital Stay: Payer: BLUE CROSS/BLUE SHIELD

## 2017-10-16 ENCOUNTER — Inpatient Hospital Stay: Payer: BLUE CROSS/BLUE SHIELD | Attending: Hematology

## 2017-10-16 ENCOUNTER — Other Ambulatory Visit: Payer: Self-pay

## 2017-10-16 VITALS — BP 123/84 | HR 90 | Temp 97.9°F | Resp 18 | Ht 66.0 in | Wt 210.3 lb

## 2017-10-16 DIAGNOSIS — R030 Elevated blood-pressure reading, without diagnosis of hypertension: Secondary | ICD-10-CM | POA: Diagnosis not present

## 2017-10-16 DIAGNOSIS — C187 Malignant neoplasm of sigmoid colon: Secondary | ICD-10-CM | POA: Diagnosis not present

## 2017-10-16 DIAGNOSIS — C189 Malignant neoplasm of colon, unspecified: Secondary | ICD-10-CM

## 2017-10-16 DIAGNOSIS — C7800 Secondary malignant neoplasm of unspecified lung: Secondary | ICD-10-CM

## 2017-10-16 DIAGNOSIS — R55 Syncope and collapse: Secondary | ICD-10-CM

## 2017-10-16 DIAGNOSIS — G629 Polyneuropathy, unspecified: Secondary | ICD-10-CM

## 2017-10-16 DIAGNOSIS — R059 Cough, unspecified: Secondary | ICD-10-CM

## 2017-10-16 DIAGNOSIS — E669 Obesity, unspecified: Secondary | ICD-10-CM

## 2017-10-16 DIAGNOSIS — Z5111 Encounter for antineoplastic chemotherapy: Secondary | ICD-10-CM | POA: Insufficient documentation

## 2017-10-16 DIAGNOSIS — R5383 Other fatigue: Secondary | ICD-10-CM | POA: Diagnosis not present

## 2017-10-16 DIAGNOSIS — R05 Cough: Secondary | ICD-10-CM

## 2017-10-16 DIAGNOSIS — Z5112 Encounter for antineoplastic immunotherapy: Secondary | ICD-10-CM | POA: Diagnosis present

## 2017-10-16 DIAGNOSIS — Z95828 Presence of other vascular implants and grafts: Secondary | ICD-10-CM

## 2017-10-16 DIAGNOSIS — R0781 Pleurodynia: Secondary | ICD-10-CM

## 2017-10-16 DIAGNOSIS — C7801 Secondary malignant neoplasm of right lung: Secondary | ICD-10-CM

## 2017-10-16 LAB — COMPREHENSIVE METABOLIC PANEL
ALBUMIN: 3.4 g/dL — AB (ref 3.5–5.0)
ALK PHOS: 95 U/L (ref 40–150)
ALT: 44 U/L (ref 0–55)
AST: 35 U/L — ABNORMAL HIGH (ref 5–34)
Anion gap: 10 (ref 3–11)
BILIRUBIN TOTAL: 0.5 mg/dL (ref 0.2–1.2)
BUN: 8 mg/dL (ref 7–26)
CALCIUM: 9 mg/dL (ref 8.4–10.4)
CO2: 24 mmol/L (ref 22–29)
Chloride: 106 mmol/L (ref 98–109)
Creatinine, Ser: 0.77 mg/dL (ref 0.70–1.30)
GFR calc Af Amer: >60 mL/min (ref >60)
GFR calc non Af Amer: >60 mL/min (ref >60)
GLUCOSE: 135 mg/dL (ref 70–140)
Potassium: 4.1 mmol/L (ref 3.5–5.1)
SODIUM: 140 mmol/L (ref 136–145)
TOTAL PROTEIN: 7 g/dL (ref 6.4–8.3)

## 2017-10-16 LAB — CBC WITH DIFFERENTIAL/PLATELET
BASOS ABS: 0 10*3/uL (ref 0.0–0.1)
BASOS PCT: 0 %
Eosinophils Absolute: 0.2 10*3/uL (ref 0.0–0.5)
Eosinophils Relative: 4 %
HEMATOCRIT: 41.6 % (ref 38.4–49.9)
HEMOGLOBIN: 13.4 g/dL (ref 13.0–17.1)
Lymphocytes Relative: 23 %
Lymphs Abs: 1 10*3/uL (ref 0.9–3.3)
MCH: 32.4 pg (ref 27.2–33.4)
MCHC: 32.2 g/dL (ref 32.0–36.0)
MCV: 100.5 fL — ABNORMAL HIGH (ref 79.3–98.0)
MONOS PCT: 18 %
Monocytes Absolute: 0.7 10*3/uL (ref 0.1–0.9)
NEUTROS ABS: 2.3 10*3/uL (ref 1.5–6.5)
NEUTROS PCT: 55 %
Platelets: 187 10*3/uL (ref 140–400)
RBC: 4.14 MIL/uL — AB (ref 4.20–5.82)
RDW: 16 % — ABNORMAL HIGH (ref 11.0–14.6)
WBC: 4.2 10*3/uL (ref 4.0–10.3)

## 2017-10-16 MED ORDER — HYDROCODONE-ACETAMINOPHEN 5-325 MG PO TABS: 1.0000 | ORAL_TABLET | Freq: Four times a day (QID) | ORAL | 0 refills | Status: DC | PRN

## 2017-10-16 MED ORDER — PROCHLORPERAZINE MALEATE 10 MG PO TABS
10.0000 mg | ORAL_TABLET | Freq: Once | ORAL | Status: AC
  Administered 2017-10-16: 10 mg via ORAL

## 2017-10-16 MED ORDER — PROCHLORPERAZINE MALEATE 10 MG PO TABS
ORAL_TABLET | ORAL | Status: AC
  Filled 2017-10-16: qty 1

## 2017-10-16 MED ORDER — SODIUM CHLORIDE 0.9 % IV SOLN: Freq: Once | INTRAVENOUS | Status: DC

## 2017-10-16 MED ORDER — LEUCOVORIN CALCIUM INJECTION 350 MG
500.0000 mg/m2 | Freq: Once | INTRAVENOUS | Status: AC
  Administered 2017-10-16: 1090 mg via INTRAVENOUS
  Filled 2017-10-16: qty 54.5

## 2017-10-16 MED ORDER — ALTEPLASE 2 MG IJ SOLR
2.0000 mg | Freq: Once | INTRAMUSCULAR | Status: DC | PRN
  Filled 2017-10-16: qty 2

## 2017-10-16 MED ORDER — ALTEPLASE 2 MG IJ SOLR
INTRAMUSCULAR | Status: AC
  Filled 2017-10-16: qty 2

## 2017-10-16 MED ORDER — HYDROCODONE-HOMATROPINE 5-1.5 MG PO TABS: 1.0000 | ORAL_TABLET | Freq: Four times a day (QID) | ORAL | 0 refills | Status: DC | PRN

## 2017-10-16 MED ORDER — SODIUM CHLORIDE 0.9% FLUSH
10.0000 mL | INTRAVENOUS | Status: DC | PRN
  Administered 2017-10-16: 10 mL
  Filled 2017-10-16: qty 10

## 2017-10-16 MED ORDER — FLUOROURACIL CHEMO INJECTION 2.5 GM/50ML
600.0000 mg/m2 | Freq: Once | INTRAVENOUS | Status: AC
  Administered 2017-10-16: 1300 mg via INTRAVENOUS
  Filled 2017-10-16: qty 26

## 2017-10-16 MED ORDER — HEPARIN SOD (PORK) LOCK FLUSH 100 UNIT/ML IV SOLN
500.0000 [IU] | Freq: Once | INTRAVENOUS | Status: AC | PRN
  Administered 2017-10-16: 500 [IU]
  Filled 2017-10-16: qty 5

## 2017-10-16 MED ORDER — SODIUM CHLORIDE 0.9% FLUSH
10.0000 mL | INTRAVENOUS | Status: DC | PRN
  Administered 2017-10-16: 10 mL via INTRAVENOUS
  Filled 2017-10-16: qty 10

## 2017-10-16 MED ORDER — ONDANSETRON HCL 4 MG/2ML IJ SOLN
INTRAMUSCULAR | Status: AC
  Filled 2017-10-16: qty 4

## 2017-10-16 MED ORDER — ONDANSETRON HCL 4 MG/2ML IJ SOLN
8.0000 mg | Freq: Once | INTRAMUSCULAR | Status: AC
  Administered 2017-10-16: 8 mg via INTRAVENOUS

## 2017-10-16 MED ORDER — SODIUM CHLORIDE 0.9 % IV SOLN
5.0000 mg/kg | Freq: Once | INTRAVENOUS | Status: AC
  Administered 2017-10-16: 500 mg via INTRAVENOUS
  Filled 2017-10-16: qty 16

## 2017-10-16 NOTE — Progress Notes (Addendum)
[place]  [phone] [phone]  Clinic Follow up Note   Patient Care Team: Lujean Amel, MD as PCP - General (Family Medicine) Wilford Corner, MD as Consulting Physician (Gastroenterology) Jackolyn Confer, MD as Consulting Physician (General Surgery) Truitt Merle, MD as Consulting Physician (Hematology) Dixie Dials, MD as Consulting Physician (Cardiology) 10/16/2017  SUMMARY OF ONCOLOGIC HISTORY: Oncology History   T2, Colon cancer metastasized to lung [facility])   Staging form: Colon and Rectum, AJCC 7th Edition     Clinical stage from 03/30/2015: Stage Unknown (TX, N1, M1) - Unsigned       Colon cancer metastasized to lung [facility]) s/p laparoscopic assisted sigmoid colectomy 11/02/15   03/30/2015 Miscellaneous    Foundation one genomic testing showed TP53 mutation, MSI stable, low tumor mutation burden. Negative for K-ras, NRAS and BRAF      03/30/2015 Initial Biopsy    Sigmoid mass biopsy showed invasive adenocarcinoma. Cecal colon polyps showed tubular adenoma.      03/30/2015 Initial Diagnosis    Colon cancer      03/30/2015 Procedure    colonoscopy by Dr. Michail Sermon showed a fungating, infiltrative and ulcerated nonobstructing large mass in the sigmoid colon and at 20 cm proximal to the anus. The mass was partially circumferential no bleeding. A 10 mm polyps in the cecum was removed.      04/03/2015 Imaging    CT chest, abdomen and pelvis with contrast showed nodular masslike area of clinical worsening at rectosigmoid junction, tiny pericolonic lymph nodes, bilateral pulmonary nodules measuring about 1 cm.      04/12/2015 PET scan    Hypermetabolic colonic mass near rectosigmoid junction, tiny subcentimeter paracolonic lymph nodes. Bilateral pulmonary metastasis.      04/19/2015 Procedure    CT-guided lung nodule biopsy attempted, unsuccessful.      04/27/2015 - 09/14/2015 Chemotherapy    Oxaliplatin 130 mg/m on day 1, Capecitabine  2356m q12hr, 2 weeks on and one week off (only received 7 days for first cycle), oxaliplatin held on cycle 5 and dose reduced to 102mm2, capecitabine reduced to 200019m12h D1-14, stopped per pt's request.       06/29/2015 - 10/19/2015 Chemotherapy     Panitumumab every 2 weeks, some cycles were postponed due to pt's request, stoppe per pt's request       09/18/2015 Imaging    Pulmonary nodules are less hypermetabolic, stable size. Stable hypermetabolic portal hepatis and abdominal peritoneal ligament lymph nodes. No other new lesions.       11/02/2015 Surgery    sigmoid colon segmental resection       11/02/2015 Pathology Results    Sigmoid colon segmental resection showed adenocarcinoma, grade 3,  T2, 3 out of 13 lymph nodes were positive, surgical margins were negative. LVI(-), perineural invasion negative      11/13/2015 Imaging    CT chest, abdomen and pelvis with contrast showed postsurgical changes, mild progression of pulmonary metastasis, measuring up to 15 mm in the right lower lobe.      11/29/2015 - 12/05/2015 Radiation Therapy    SBRT to 4 lung lesions in 3 sessions       04/25/2016 - 08/05/2016 Chemotherapy    FOLFIRI every 2 weeks, and Avastin added from cycle 3, 5-FU held since cycle 2 due to poor tolerance. Chemo was stopped after 3 months due to overall poor tolerance and pt's request to stop.       06/27/2016 Imaging    CT CAP 06/27/16 IMPRESSION: Status  post partial left hemicolectomy. Progressive wall thickening involving the colon near the suture line, worrisome for residual tumor. Adjacent 7 mm short axis pericolonic lymph node, possibly reflecting a nodal metastasis. Radiation changes in the posterior right upper lobe and superior segment right lower lobe. Progressive pulmonary metastases bilaterally, measuring up to 13 mm, as above.      09/17/2016 Imaging    CT A/P/C w contrast  IMPRESSION: 1. Multifocal pulmonary nodules are not significantly changed  in size compared with the previous exam. 2. Stable appearance of radiation changes within the right midlung. 3. Stable to scratch set improved appearance of wall thickening involving the colon at the level of the suture line.       11/11/2016 Imaging    CT Chest WO Contrast 11/11/16: IMPRESSION: 1. Minimal enlargement of some pulmonary metastases. 2. Coronary artery calcification. 3. Hepatic steatosis.       02/10/2017 Imaging    CT CAP W Contrast 02/10/17 IMPRESSION: 1. Progressive metastatic disease to the thorax as demonstrated by increased size of numerous previously noted pulmonary nodules, what appears to be lymphangitic spread of disease developing in the right upper lobe and superior segment of the right lower lobe, and new 1.5 cm short axis prevascular lymph node. 2. No definite findings of metastatic disease in the abdomen or pelvis. 3. Multiple tiny nonobstructive calculi in the collecting systems of the kidneys bilaterally measuring 2-3 mm in size. No ureteral stones or findings of urinary tract obstruction are noted at this time. 4. Hepatic steatosis. 5. Aortic atherosclerosis, in addition to left anterior descending coronary artery disease. Please note that although the presence of coronary artery calcium documents the presence of coronary artery disease, the severity of this disease and any potential stenosis cannot be assessed on this non-gated CT examination. Assessment for potential risk factor modification, dietary therapy or pharmacologic therapy may be warranted, if clinically indicated. 6. There are calcifications of the aortic valve. Echocardiographic correlation for evaluation of potential valvular dysfunction may be warranted if clinically indicated.       02/27/2017 - 06/13/2017 Chemotherapy    Third-line chemotherapy Xeloda for 2 weeks on and 1 week off with Oxaliplatin every 3 weeks and Panitumumab every 2 weeks on a different day. He developed  anaphylactic reaction to oxaliplatin, coded, oxaliplatin was subsequently stopped. He did not to tolerate Xeloda, plan to change to 5-FU bolus weekly, Held chemo since 03/20/17.   Restarted panitumumab every 2 weeks on 05/01/17 and ended on 06/13/17 due to disease progression        06/11/2017 Imaging    CT CAP W Contrast 06/11/17  IMPRESSION: 1. Continued marked interval progression of pulmonary metastatic disease with mediastinal and left hilar metastases evident. 2. Interval development of a 4.3 cm metastatic lesion in the dome of the liver. 3. Stable nonobstructing nephrolithiasis.      06/26/2017 -  Chemotherapy    Fourth-Line chemo leucovorin/5-fu bolus weekly with avastin every 3 weeks - leucovorin bolus starting 06/26/17        08/19/2017 Imaging    IMPRESSION: Chest Impression:  1. Interval decrease in size of rounded pulmonary nodules and consolidative mass lesions in LEFT upper lobe. 2. No new pulmonary nodule. 3. No mediastinal fat  Abdomen / Pelvis Impression:  1. Interval decrease in size enhancing lesion in the RIGHT hepatic lobe. 2. No new or progressive disease in liver. 3. No abdominal adenopathy peritoneal metastasis 4. Rectal anastomosis, stable.       CURRENT TREATMENT: Fourth-Line chemo  leucovorin/5-fu bolus weekly with avastin every week, started 06/26/17 - 08/07/17. Chemo break from 08/07/17 - 09/04/17 and resumed weekly x2 until chemo changed to every other week starting 09/25/17.   INTERVAL HISTORY: Mr. Ruddy returns for follow up as scheduled. Completed last cycle 5FU bolus/leucovorin and avastin on 09/25/17. He requested 2 weeks off to go back to work; has been able to work full-time for 2 weeks which pleases him. Although he is very tired after work his fatigue improved overall with time off chemo. Has gained weight intentionally lately due to soda/sugar intake. Occasionally regurgitates food after a coughing fit but denies nausea. Had  syncopal event 5 days ago at home after sitting up quickly in bed. He felt a "special" cough coming on that he characterizes as deep and dry; he sat up in bed to cough then lost consciousness for a "few seconds" but woke up on the floor. Fell on his nightstand with bruised left chest. Denies chest pain, dyspnea, lightheadedness, or dizziness during the event. He is moving back to [place], [place] to be near family on 12/04/17.   REVIEW OF SYSTEMS:    Constitutional: Denies fevers, chills or abnormal weight loss (+) weight gain (+) fatigue, improved  Eyes: Denies blurriness of vision Ears, nose, mouth, throat, and face: Denies mucositis or sore throat (+) cold sore to lip  Respiratory: Denies dyspnea or wheezes (+) chronic dry cough  Cardiovascular: Denies palpitation, chest discomfort or lower extremity swelling (+) syncope  Gastrointestinal:  Denies nausea, vomiting, constipation, diarrhea, heartburn or change in bowel habits (+) regurgitation after coughing fit  Skin: Denies abnormal skin rashes Lymphatics: Denies new lymphadenopathy or easy bruising Neurological:Denies numbness, tingling or new weaknesses (+) "electrical impulses" to left thigh  Behavioral/Psych: Mood is stable, no new changes  All other systems were reviewed with the patient and are negative.  MEDICAL HISTORY:  Past Medical History:  Diagnosis Date  . Anxiety    situational due to cancer diagnosis  . Cancer [facility]) 2017   colon-chemo 09/22/15 now surgery  . Hypercholesteremia   . Kidney calculi     SURGICAL HISTORY: Past Surgical History:  Procedure Laterality Date  . COLONOSCOPY  03/30/15  . IR CV LINE INJECTION  04/09/2017  . IR FLUORO GUIDE PORT INSERTION LEFT  06/26/2017  . IR FLUORO GUIDE PORT INSERTION RIGHT  04/15/2017  . IR REMOVAL TUN ACCESS W/ PORT W/O FL MOD SED  04/15/2017  . IR REMOVAL TUN ACCESS W/ PORT W/O FL MOD SED  06/26/2017  . IR US GUIDE VASC ACCESS LEFT  06/26/2017  . IR US GUIDE VASC ACCESS  RIGHT  04/15/2017  . LAPAROSCOPIC PARTIAL COLECTOMY N/A 11/02/2015   Procedure: LAPAROSCOPIC ASSISTED SIGMOID COLECTOMY;  Surgeon: Jackolyn Confer, MD;  Location: [facility];  Service: General;  Laterality: N/A;  . LEFT HEART CATH AND CORONARY ANGIOGRAPHY N/A 03/21/2017   Procedure: LEFT HEART CATH AND CORONARY ANGIOGRAPHY;  Surgeon: Dixie Dials, MD;  Location: [facility];  Service: Cardiovascular;  Laterality: N/A;  . PORTACATH PLACEMENT N/A 05/08/2016   Procedure: INSERTION PORT-A-CATH;  Surgeon: Jackolyn Confer, MD;  Location: [facility];  Service: General;  Laterality: N/A;    I have reviewed the social history and family history with the patient and they are unchanged from previous note.  ALLERGIES:  is allergic to oxaliplatin.  MEDICATIONS:  Current Outpatient Medications  Medication Sig Dispense Refill  . diltiazem (CARDIZEM) 30 MG tablet Take 30 mg by mouth 3 (three) times daily.    Marland Kitchen  diphenoxylate-atropine (LOMOTIL) 2.5-0.025 MG tablet Take 1-2 tablets by mouth 4 (four) times daily as needed for diarrhea or loose stools. 40 tablet 2  . Fluticasone-Salmeterol (ADVAIR DISKUS) 250-50 MCG/DOSE AEPB Inhale 1 puff into the lungs 2 (two) times daily. (Patient taking differently: Inhale 1 puff into the lungs daily. ) 180 each 1  . HYDROcodone-acetaminophen (NORCO/VICODIN) 5-325 MG tablet Take 1 tablet by mouth every 6 (six) hours as needed for moderate pain or severe pain. 30 tablet 0  . HYDROcodone-Homatropine 5-1.5 MG TABS Take 1 tablet by mouth every 6 (six) hours as needed. 40 each 0  . LORazepam (ATIVAN) 0.5 MG tablet Take 1 tablet (0.5 mg total) by mouth every 6 (six) hours as needed (Nausea or vomiting). 30 tablet 0  . metoprolol tartrate (LOPRESSOR) 25 MG tablet Take 1 tablet (25 mg total) by mouth 2 (two) times daily. 60 tablet 3  . mometasone (ELOCON) 0.1 % cream Apply 1 application topically daily. 45 g 2  . ondansetron (ZOFRAN) 8 MG tablet Take 1 tablet (8 mg total) by mouth 2  (two) times daily as needed (Nausea or vomiting). 30 tablet 1  . prochlorperazine (COMPAZINE) 10 MG tablet Take 1 tablet (10 mg total) by mouth every 6 (six) hours as needed (Nausea or vomiting). 30 tablet 1  . albuterol (VENTOLIN HFA) 108 (90 Base) MCG/ACT inhaler Inhale 1 puff into the lungs daily as needed for wheezing or shortness of breath. 10 PUFFS DAILY FOR WHEEZING AND SHORTNESS OF BREATH    . clindamycin (CLINDAGEL) 1 % gel Apply 2 (two) times daily topically. 30 g 0   No current facility-administered medications for this visit.     PHYSICAL EXAMINATION: ECOG PERFORMANCE STATUS: 1 - Symptomatic but completely ambulatory  Vitals:   10/16/17 1133  BP: 123/84  Pulse: 90  Resp: 18  Temp: 97.9 F (36.6 C)  SpO2: 98%   Filed Weights   10/16/17 1133  Weight: 210 lb 4.8 oz (95.4 kg)    GENERAL:alert, no distress and comfortable SKIN: skin color, texture, turgor are normal, no rashes or significant lesions (+) ecchymosis with tenderness to left pectoral area secondary to fall  EYES: normal, Conjunctiva are pink and non-injected, sclera clear OROPHARYNX:no ulceration or thrush   LYMPH:  no palpable cervical, supraclavicular, axillary lymphadenopathy  LUNGS: clear to auscultation with normal breathing effort HEART: regular rate & rhythm and no murmurs and no lower extremity edema ABDOMEN:abdomen soft, non-tender and normal bowel sounds Musculoskeletal:no cyanosis of digits and no clubbing  NEURO: alert & oriented x 3 with fluent speech, no focal motor/sensory deficits PAC without erythema   LABORATORY DATA:  I have reviewed the data as listed CBC Latest Ref Rng & Units 10/16/2017 09/25/2017 09/11/2017  WBC 4.0 - 10.3 K/uL 4.2 3.7(L) 9.4  Hemoglobin 13.0 - 17.1 g/dL 13.4 12.4(L) 12.7(L)  Hematocrit 38.4 - 49.9 % 41.6 37.9(L) 39.0  Platelets 140 - 400 K/uL 187 186 242     CMP Latest Ref Rng & Units 10/16/2017 09/25/2017 09/11/2017  Glucose 70 - 140 mg/dL 135 134 119  BUN 7 - 26  mg/dL 8 6(L) 6(L)  Creatinine 0.70 - 1.30 mg/dL 0.77 0.79 0.78  Sodium 136 - 145 mmol/L 140 140 138  Potassium 3.5 - 5.1 mmol/L 4.1 4.0 4.0  Chloride 98 - 109 mmol/L 106 105 105  CO2 22 - 29 mmol/L _0 Calcium 8.4 - 10.4 mg/dL 9.0 9.4 9.5  Total Protein 6.4 - 8.3 g/dL 7.0 7.1 7.0  Total Bilirubin 0.2 - 1.2 mg/dL 0.5 0.8 0.6  Alkaline Phos 40 - 150 U/L 95 81 84  AST 5 - 34 U/L 35(H) 21 26  ALT 0 - 55 U/L 44 25 40   CEA:  04/20/2016: <0.5 06/08/2015: <0.5 09/07/2015: <0.5 11/15/2015: 0.9 02/16/2016: <1.0  04/25/2016: 1.6 05/23/16: <1.00 06/20/2016: 1.05 07/22/2016: <1.00 09/17/16: <1.00 11/14/16: 1.60 02/10/17: 5.94 03/20/2017: 4.92 05/01/17: 13.29 05/30/17: 19.94 06/13/17: 27.88 07/10/17: 41.26  08/07/17: 25.45 08/21/17: 13.78 09/25/17 13.47   PATHOLOGY REPORT  Diagnosis 11/02/2015 1. Colon, segmental resection for tumor, sigmoid ADENOCARCINOMA OF THE SIGMOID COLON (2.0 CM), GRADE 3 THE TUMOR INVADES MUSCULARIS PROPRIA MARGINS OF RESECTION ARE NEGATIVE METASTATIC ADENOCARCINOMA IN THREE OF THIRTEEN LYMPH NODES (3/13) 2. Colon, resection margin (donut), distal sigmoid BENIGN COLONIC TISSUE Microscopic Comment 1. COLON AND RECTUM (INCLUDING TRANS-ANAL RESECTION): Specimen: Sigmoid Procedure: Segmental resection Tumor site: sigmoid Specimen integrity: Intact Macroscopic intactness of mesorectum: Not applicable: x Complete: NA Near complete: NA Incomplete: NA Cannot be determined (specify): NA Macroscopic tumor perforation: Muscularis Invasive tumor: Maximum size: 2.0 cm Histologic type(s): Adenocarcinoma Histologic grade and differentiation: G3 G1: well differentiated/low grade G2: moderately differentiated/low grade G3: poorly differentiated/high grade G4: undifferentiated/high grade Type of polyp in which invasive carcinoma arose: Tubular adenoma Microscopic extension of invasive tumor: Muscularis propria Lymph-Vascular invasion: Negative Peri-neural  invasion: Negative Tumor deposit(s) (discontinuous extramural extension): Negative Resection margins: Proximal margin: Negative Distal margin: Negative Circumferential (radial) (posterior ascending, posterior descending; lateral and posterior mid-rectum; and entire lower 1/3 rectum):Negative Mesenteric margin (sigmoid and transverse): Negative Distance closest margin (if all above margins negative): 3.5 cm Trans-anal resection margins only: Deep margin: NA Mucosal Margin: NA Distance closest mucosal margin (if negative): NA Treatment effect (neo-adjuvant therapy): Partial Additional polyp(s): None Non-neoplastic findings: unremarkable Lymph nodes: number examined 13; number positive: 3 Pathologic Staging: T2, N1b, M1a       RADIOGRAPHIC STUDIES: I have personally reviewed the radiological images as listed and agreed with the findings in the report. No results found.   ASSESSMENT & PLAN: 57 y.o.male, without significant past medical history except kidney stone, presented with intermittent bloody stool for 2 years, and colonoscopy showed a large sigmoid colon mass, CT scan showed multiple (at least 4) nodules in bilateral lungs, measuring about 1 cm.  1. Syncope, cough 2. Sigmoid colon adenocarcinoma, pT2N1bM1, stage IV with lung mets, KRA/NRAS wild type, MSI-stable 3. Cardiac arrest 03/20/17, secondary to oxaliplatin 4. Fatigue 5. Obesity 6. Anxiety 7. Left leg neuropathy 8. Goals of care discussion - full code  9. Elevated BP, likely secondary to avastin  Mr. Ingwersen appears stable. He completed another cycle of 5FU bolus/leucovorin and avastin on 09/25/17, he tolerated well and was able to go back to work full-time. Fatigue is improved. he agrees to be consistent with chemotherapy and continue q2 weeks until he relocates on 12/04/17. VSS, weight increased. Labs reviewed, adequate to proceed with chemotherapy. CEA stable. Proceed with next cycle today. Will obtain restaging CT  in 4 weeks before he moves to [facility]. Will refer him to GI oncologist in that area.   He had syncope after one cough 4 days ago at home, etiology unclear. I reviewed his EKG independently and with Dr. Burr Medico, no arrhythmia. I have asked his cardiologist to review the EKG and encouraged the patient to f/u with Dr. Doylene Canard. His cough and pleuritic pain appear worse at times, I refilled cough and pain medicine today; will monitor pulmonary metastasis on next restaging CT.   PLAN: -Labs  reviewed, proceed with next cycle 5FU bolus/leucovorin and avastin today, continue q2 weeks -EKG today, requested cardiologist review  -Refilled norco for pain and hydrocodone/homatropine for cough  -Restaging CT in 4 weeks prior to move to [facility], ordered today -Refer to GI oncologist in [place], [place] area   Orders Placed This Encounter  Procedures  . CT Abdomen Pelvis W Contrast    Standing Status:   Future    Standing Expiration Date:   10/16/2018    Order Specific Question:   If indicated for the ordered procedure, I authorize the administration of contrast media per Radiology protocol    Answer:   Yes    Order Specific Question:   Preferred imaging location?    Answer:   [facility]    Order Specific Question:   Radiology Contrast Protocol - do NOT remove file path    Answer:   [email or web address]    Order Specific Question:   Reason for Exam additional comments    Answer:   metastatic colon cancer on chemotherapy, restaging  . CT Chest W Contrast    Standing Status:   Future    Standing Expiration Date:   10/16/2018    Order Specific Question:   If indicated for the ordered procedure, I authorize the administration of contrast media per Radiology protocol    Answer:   Yes    Order Specific Question:   Preferred imaging location?    Answer:   [facility]    Order Specific Question:   Radiology Contrast Protocol - do NOT remove file path    Answer:    [email or web address]    Order Specific Question:   Reason for Exam additional comments    Answer:   metastatic colon cancer on chemothearpy, restaging  . EKG 12-Lead    Standing Status:   Standing    Number of Occurrences:   1    Order Specific Question:   Reason for Exam    Answer:   syncope after cough   All questions were answered. The patient knows to call the clinic with any problems, questions or concerns. No barriers to learning was detected. I spent 20 minutes counseling the patient face to face. The total time spent in the appointment was 25 minutes and more than 50% was on counseling and review of test results     Alla Feeling, NP 10/16/17   Addendum  I have seen the patient, examined him. I agree with the assessment and and plan and have edited the notes.   Mr. Hassing has returned to work full-time 2 weeks ago, tolerating well, does feel exhausted after work.  His cough has slightly worse, we have reduced chemo frequency per his request due to his work schedule.  I encouraged him to stay on 5-FU bolus and Avastin every 2 weeks, he is agreeable.  He had a syncope episode at home recently, I did a EKG in the clinic, which was unremarkable.  We will get his cardiologist to review and I encouraged him to follow-up with his cardiologist.  He is moving to [place] to be closer to his family in early June, will help him transferring his oncology care to [facility] in [place].  Plan to repeat restaging CT scans before his move.   Truitt Merle  10/16/2017

## 2017-10-16 NOTE — Patient Instructions (Signed)
[facility] Discharge Instructions for Patients Receiving Chemotherapy  Today you received the following chemotherapy agents Leucovorin, 5FU, and Avastin  To help prevent nausea and vomiting after your treatment, we encourage you to take your nausea medication as directed   If you develop nausea and vomiting that is not controlled by your nausea medication, call the clinic.   BELOW ARE SYMPTOMS THAT SHOULD BE REPORTED IMMEDIATELY:  *FEVER GREATER THAN 100.5 F  *CHILLS WITH OR WITHOUT FEVER  NAUSEA AND VOMITING THAT IS NOT CONTROLLED WITH YOUR NAUSEA MEDICATION  *UNUSUAL SHORTNESS OF BREATH  *UNUSUAL BRUISING OR BLEEDING  TENDERNESS IN MOUTH AND THROAT WITH OR WITHOUT PRESENCE OF ULCERS  *URINARY PROBLEMS  *BOWEL PROBLEMS  UNUSUAL RASH Items with * indicate a potential emergency and should be followed up as soon as possible.  Feel free to call the clinic should you have any questions or concerns. The clinic phone number is [phone].  Please show the CHEMO ALERT CARD at check-in to the Emergency Department and triage nurse.   

## 2017-10-16 NOTE — Progress Notes (Signed)
Port flushed several times and reaccessed. No blood return. Call Cira Rue desk nurse and CATHFLOW will be administered by RN (Krisitin). Labs were drawn by LAB 3. Nitin Mckowen LPN

## 2017-10-20 ENCOUNTER — Telehealth: Payer: Self-pay | Admitting: Hematology

## 2017-10-20 NOTE — Telephone Encounter (Signed)
Appointments scheduled Calendar / letter mailed to patient.  I called him also with next appt info.

## 2017-10-30 ENCOUNTER — Inpatient Hospital Stay: Payer: BLUE CROSS/BLUE SHIELD

## 2017-10-30 ENCOUNTER — Other Ambulatory Visit: Payer: Self-pay | Admitting: Oncology

## 2017-10-30 ENCOUNTER — Other Ambulatory Visit: Payer: Self-pay | Admitting: Medical

## 2017-10-30 ENCOUNTER — Inpatient Hospital Stay (HOSPITAL_BASED_OUTPATIENT_CLINIC_OR_DEPARTMENT_OTHER): Payer: BLUE CROSS/BLUE SHIELD | Admitting: Medical

## 2017-10-30 VITALS — BP 126/77 | HR 87 | Temp 98.3°F | Resp 18 | Wt 211.8 lb

## 2017-10-30 DIAGNOSIS — R05 Cough: Secondary | ICD-10-CM

## 2017-10-30 DIAGNOSIS — C7800 Secondary malignant neoplasm of unspecified lung: Principal | ICD-10-CM

## 2017-10-30 DIAGNOSIS — C189 Malignant neoplasm of colon, unspecified: Secondary | ICD-10-CM

## 2017-10-30 DIAGNOSIS — C187 Malignant neoplasm of sigmoid colon: Secondary | ICD-10-CM | POA: Diagnosis not present

## 2017-10-30 DIAGNOSIS — R062 Wheezing: Secondary | ICD-10-CM

## 2017-10-30 DIAGNOSIS — R059 Cough, unspecified: Secondary | ICD-10-CM

## 2017-10-30 DIAGNOSIS — Z95828 Presence of other vascular implants and grafts: Secondary | ICD-10-CM

## 2017-10-30 DIAGNOSIS — C7801 Secondary malignant neoplasm of right lung: Secondary | ICD-10-CM

## 2017-10-30 LAB — COMPREHENSIVE METABOLIC PANEL
ALK PHOS: 86 U/L (ref 40–150)
ALT: 34 U/L (ref 0–55)
AST: 28 U/L (ref 5–34)
Albumin: 3.6 g/dL (ref 3.5–5.0)
Anion gap: 8 (ref 3–11)
BILIRUBIN TOTAL: 0.7 mg/dL (ref 0.2–1.2)
BUN: 8 mg/dL (ref 7–26)
CALCIUM: 9.4 mg/dL (ref 8.4–10.4)
CO2: 27 mmol/L (ref 22–29)
Chloride: 103 mmol/L (ref 98–109)
Creatinine, Ser: 0.77 mg/dL (ref 0.70–1.30)
Glucose, Bld: 134 mg/dL (ref 70–140)
Potassium: 4.2 mmol/L (ref 3.5–5.1)
Sodium: 138 mmol/L (ref 136–145)
TOTAL PROTEIN: 7.1 g/dL (ref 6.4–8.3)

## 2017-10-30 LAB — CBC WITH DIFFERENTIAL/PLATELET
BASOS PCT: 1 %
Basophils Absolute: 0.1 10*3/uL (ref 0.0–0.1)
EOS PCT: 4 %
Eosinophils Absolute: 0.2 10*3/uL (ref 0.0–0.5)
HCT: 39.2 % (ref 38.4–49.9)
HEMOGLOBIN: 13.1 g/dL (ref 13.0–17.1)
Lymphocytes Relative: 17 %
Lymphs Abs: 1 10*3/uL (ref 0.9–3.3)
MCH: 32.7 pg (ref 27.2–33.4)
MCHC: 33.4 g/dL (ref 32.0–36.0)
MCV: 97.8 fL (ref 79.3–98.0)
Monocytes Absolute: 0.7 10*3/uL (ref 0.1–0.9)
Monocytes Relative: 12 %
NEUTROS PCT: 66 %
Neutro Abs: 3.8 10*3/uL (ref 1.5–6.5)
PLATELETS: 178 10*3/uL (ref 140–400)
RBC: 4 MIL/uL — AB (ref 4.20–5.82)
RDW: 15.4 % — ABNORMAL HIGH (ref 11.0–14.6)
WBC: 5.7 10*3/uL (ref 4.0–10.3)

## 2017-10-30 LAB — CEA (IN HOUSE-CHCC): CEA (CHCC-IN HOUSE): 17.34 ng/mL — AB (ref 0.00–5.00)

## 2017-10-30 LAB — TOTAL PROTEIN, URINE DIPSTICK: Protein, ur: NEGATIVE mg/dL

## 2017-10-30 MED ORDER — HYDROCODONE-HOMATROPINE 5-1.5 MG PO TABS: 1.0000 | ORAL_TABLET | Freq: Four times a day (QID) | ORAL | 0 refills | Status: DC | PRN

## 2017-10-30 MED ORDER — FLUOROURACIL CHEMO INJECTION 2.5 GM/50ML
600.0000 mg/m2 | Freq: Once | INTRAVENOUS | Status: AC
  Administered 2017-10-30: 1300 mg via INTRAVENOUS
  Filled 2017-10-30: qty 26

## 2017-10-30 MED ORDER — SODIUM CHLORIDE 0.9% FLUSH
10.0000 mL | INTRAVENOUS | Status: DC | PRN
  Administered 2017-10-30: 10 mL via INTRAVENOUS
  Filled 2017-10-30: qty 10

## 2017-10-30 MED ORDER — LEUCOVORIN CALCIUM INJECTION 350 MG
500.0000 mg/m2 | Freq: Once | INTRAVENOUS | Status: AC
  Administered 2017-10-30: 1090 mg via INTRAVENOUS
  Filled 2017-10-30: qty 54.5

## 2017-10-30 MED ORDER — SODIUM CHLORIDE 0.9 % IV SOLN
5.0000 mg/kg | Freq: Once | INTRAVENOUS | Status: AC
  Administered 2017-10-30: 500 mg via INTRAVENOUS
  Filled 2017-10-30: qty 16

## 2017-10-30 MED ORDER — SODIUM CHLORIDE 0.9% FLUSH
10.0000 mL | INTRAVENOUS | Status: DC | PRN
  Administered 2017-10-30: 10 mL
  Filled 2017-10-30: qty 10

## 2017-10-30 MED ORDER — ONDANSETRON HCL 4 MG/2ML IJ SOLN
INTRAMUSCULAR | Status: AC
  Filled 2017-10-30: qty 4

## 2017-10-30 MED ORDER — SODIUM CHLORIDE 0.9 % IV SOLN
Freq: Once | INTRAVENOUS | Status: AC
  Administered 2017-10-30: 13:00:00 via INTRAVENOUS

## 2017-10-30 MED ORDER — HEPARIN SOD (PORK) LOCK FLUSH 100 UNIT/ML IV SOLN
500.0000 [IU] | Freq: Once | INTRAVENOUS | Status: AC | PRN
  Administered 2017-10-30: 500 [IU]
  Filled 2017-10-30: qty 5

## 2017-10-30 MED ORDER — ALBUTEROL SULFATE HFA 108 (90 BASE) MCG/ACT IN AERS: 1.0000 | INHALATION_SPRAY | RESPIRATORY_TRACT | 3 refills | Status: AC | PRN

## 2017-10-30 MED ORDER — PROCHLORPERAZINE MALEATE 10 MG PO TABS
ORAL_TABLET | ORAL | Status: AC
  Filled 2017-10-30: qty 1

## 2017-10-30 MED ORDER — PROCHLORPERAZINE MALEATE 10 MG PO TABS
10.0000 mg | ORAL_TABLET | Freq: Once | ORAL | Status: AC
  Administered 2017-10-30: 10 mg via ORAL

## 2017-10-30 MED ORDER — ONDANSETRON HCL 4 MG/2ML IJ SOLN
8.0000 mg | Freq: Once | INTRAMUSCULAR | Status: AC
  Administered 2017-10-30: 8 mg via INTRAVENOUS

## 2017-10-30 NOTE — Progress Notes (Signed)
Called Sandi Mealy, PA and he came to beside to assess. Complaining of a cough and wheezing. Okay to treat today, per V. Tanner, PA. Sandi Mealy, PA to send Rx to pharmacy for inhaler and cough medicine.

## 2017-10-30 NOTE — Patient Instructions (Addendum)
[facility] Discharge Instructions for Patients Receiving Chemotherapy  Today you received the following chemotherapy agents: Leucovorin, Avastin and 5FU.  To help prevent nausea and vomiting after your treatment, we encourage you to take your nausea medication as directed.   If you develop nausea and vomiting that is not controlled by your nausea medication, call the clinic.   BELOW ARE SYMPTOMS THAT SHOULD BE REPORTED IMMEDIATELY:  *FEVER GREATER THAN 100.5 F  *CHILLS WITH OR WITHOUT FEVER  NAUSEA AND VOMITING THAT IS NOT CONTROLLED WITH YOUR NAUSEA MEDICATION  *UNUSUAL SHORTNESS OF BREATH  *UNUSUAL BRUISING OR BLEEDING  TENDERNESS IN MOUTH AND THROAT WITH OR WITHOUT PRESENCE OF ULCERS  *URINARY PROBLEMS  *BOWEL PROBLEMS  UNUSUAL RASH Items with * indicate a potential emergency and should be followed up as soon as possible.  Feel free to call the clinic should you have any questions or concerns. The clinic phone number is [phone].  Please show the [facility] at check-in to the Emergency Department and triage nurse.

## 2017-10-31 NOTE — Progress Notes (Addendum)
[facility] Progress Note   Bill Wright [id number] [birth year] 57 y.o.  [name] is managed by Dr. Truitt Merle  Actively treated with chemotherapy: yes  Current Therapy:  5-FU, leucovorin, and Avastin  Last Treated: 10/16/2017 (cycle 2-day 22)  Assessment: Plan:    Cough  Wheezing   Cough: HYDROcodone-Homatropine 5-1.5 MG TABS, 1 tablet every 6 hours as needed for cough.  Wheezing: Patient was given a prescription for an albuterol inhaler 1 to 2 puffs every 4 hours as needed for wheezing.  Please see After Visit Summary for patient specific instructions.  Future Appointments  Date Time Provider [place]  11/13/2017  9:45 AM [facility] LAB 5 [facility] None  11/13/2017 10:00 AM [facility] FLUSH NURSE 2 [facility] None  11/13/2017 10:30 AM Truitt Merle, MD [facility] None  11/13/2017 11:30 AM [facility] J32 DNS [facility] None  11/27/2017  9:45 AM [facility] LAB 6 [facility] None  11/27/2017 10:00 AM [facility] FLUSH NURSE [facility] None  11/27/2017 10:30 AM Alla Feeling, NP [facility] None  11/27/2017 12:00 PM [facility] C8 [facility] None    No orders of the defined types were placed in this encounter.      Subjective:   Patient ID:  Bill Wright is a 57 y.o. (DOB December 06, 1960) male.  Chief Complaint: No chief complaint on file.   HPI Bill Wright is a 57 year old male with a history of a metastatic colon cancer with metastasis to the lung.  He is treated by Dr. Truitt Merle and is receiving cycle 2-day 29 of 5-FU, leucovorin, and Avastin.  He is seen in the infusion room today for a report of wheezing and cough.  He has been using his daughter's albuterol inhaler for his wheezing.  He denies fevers, chills, sweats, shortness of breath, facial pressure, or sinus pain.  Medications: I have reviewed the patient's current medications.  Allergies:  Allergies  Allergen Reactions  . Oxaliplatin Anaphylaxis    Patient experienced cardiac  arrest while receiving medication IV Oxaliplatin.    Past Medical History:  Diagnosis Date  . Anxiety    situational due to cancer diagnosis  . Cancer [facility]) 2017   colon-chemo 09/22/15 now surgery  . Hypercholesteremia   . Kidney calculi     Past Surgical History:  Procedure Laterality Date  . COLONOSCOPY  03/30/15  . IR CV LINE INJECTION  04/09/2017  . IR FLUORO GUIDE PORT INSERTION LEFT  06/26/2017  . IR FLUORO GUIDE PORT INSERTION RIGHT  04/15/2017  . IR REMOVAL TUN ACCESS W/ PORT W/O FL MOD SED  04/15/2017  . IR REMOVAL TUN ACCESS W/ PORT W/O FL MOD SED  06/26/2017  . IR US GUIDE VASC ACCESS LEFT  06/26/2017  . IR US GUIDE VASC ACCESS RIGHT  04/15/2017  . LAPAROSCOPIC PARTIAL COLECTOMY N/A 11/02/2015   Procedure: LAPAROSCOPIC ASSISTED SIGMOID COLECTOMY;  Surgeon: Jackolyn Confer, MD;  Location: [facility];  Service: General;  Laterality: N/A;  . LEFT HEART CATH AND CORONARY ANGIOGRAPHY N/A 03/21/2017   Procedure: LEFT HEART CATH AND CORONARY ANGIOGRAPHY;  Surgeon: Dixie Dials, MD;  Location: [facility];  Service: Cardiovascular;  Laterality: N/A;  . PORTACATH PLACEMENT N/A 05/08/2016   Procedure: INSERTION PORT-A-CATH;  Surgeon: Jackolyn Confer, MD;  Location: [facility];  Service: General;  Laterality: N/A;    Family History  Problem Relation Age of Onset  . Breast cancer Mother 65       +rad and lymph node  . Diabetes Maternal Grandmother  leading to blindness  . Obesity Maternal Aunt   . Stroke Maternal Uncle 49    Social History   Socioeconomic History  . Marital status: Married    Spouse name: Not on file  . Number of children: Not on file  . Years of education: Not on file  . Highest education level: Not on file  Occupational History  . Not on file  Social Needs  . Financial resource strain: Not on file  . Food insecurity:    Worry: Not on file    Inability: Not on file  . Transportation needs:    Medical: Not on file    Non-medical: Not on file    Tobacco Use  . Smoking status: Never Smoker  . Smokeless tobacco: Never Used  Substance and Sexual Activity  . Alcohol use: No  . Drug use: No  . Sexual activity: Not on file  Lifestyle  . Physical activity:    Days per week: Not on file    Minutes per session: Not on file  . Stress: Not on file  Relationships  . Social connections:    Talks on phone: Not on file    Gets together: Not on file    Attends religious service: Not on file    Active member of club or organization: Not on file    Attends meetings of clubs or organizations: Not on file    Relationship status: Not on file  . Intimate partner violence:    Fear of current or ex partner: Not on file    Emotionally abused: Not on file    Physically abused: Not on file    Forced sexual activity: Not on file  Other Topics Concern  . 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   Please see review of systems for further details on the patient's review from today.   Review of Systems:  Review of Systems  Constitutional: Negative for chills and diaphoresis.  HENT: Negative for congestion, facial swelling and trouble swallowing.   Respiratory: Positive for cough and wheezing. Negative for choking, chest tightness and shortness of breath.   Cardiovascular: Negative for chest pain and palpitations.  Gastrointestinal: Negative for nausea and vomiting.  Musculoskeletal: Negative for back pain.  Skin: Negative for rash.  Neurological: Negative for dizziness, speech difficulty and headaches.  Psychiatric/Behavioral: The patient is not nervous/anxious.     Objective:   Physical Exam:  There were no vitals taken for this visit. ECOG: 0  Physical Exam  Constitutional:  No distress.  HENT:  Head: Normocephalic and atraumatic.  Cardiovascular: Normal rate, regular rhythm and normal heart sounds. Exam reveals no gallop and no friction rub.  No murmur heard. Pulmonary/Chest: Effort normal. No respiratory distress. He has wheezes (Left upper lobe). He has no rales.  Neurological: He is alert.  Skin: Skin is warm and dry. No rash noted. He is not diaphoretic. No erythema.    Lab Review:     Component Value Date/Time   NA 138 10/30/2017 1212   NA 136 07/10/2017 1012   K 4.2 10/30/2017 1212   K 4.2 07/10/2017 1012   CL 103 10/30/2017 1212   CO2 27 10/30/2017 1212   CO2  26 07/10/2017 1012   GLUCOSE 134 10/30/2017 1212   GLUCOSE 128 07/10/2017 1012   BUN 8 10/30/2017 1212   BUN 10.9 07/10/2017 1012   CREATININE 0.77 10/30/2017 1212   CREATININE 0.8 07/10/2017 1012   CALCIUM 9.4 10/30/2017 1212   CALCIUM 9.1 07/10/2017 1012   PROT 7.1 10/30/2017 1212   PROT 7.1 07/10/2017 1012   ALBUMIN 3.6 10/30/2017 1212   ALBUMIN 3.5 07/10/2017 1012   AST 28 10/30/2017 1212   AST 17 07/10/2017 1012   ALT 34 10/30/2017 1212   ALT 20 07/10/2017 1012   ALKPHOS 86 10/30/2017 1212   ALKPHOS 101 07/10/2017 1012   BILITOT 0.7 10/30/2017 1212   BILITOT 0.38 07/10/2017 1012   GFRNONAA >60 10/30/2017 1212   GFRAA >60 10/30/2017 1212       Component Value Date/Time   WBC 5.7 10/30/2017 1212   RBC 4.00 (L) 10/30/2017 1212   HGB 13.1 10/30/2017 1212   HGB 13.5 07/10/2017 1012   HCT 39.2 10/30/2017 1212   HCT 40.7 07/10/2017 1012   PLT 178 10/30/2017 1212   PLT 260 07/10/2017 1012   MCV 97.8 10/30/2017 1212   MCV 86.6 07/10/2017 1012   MCH 32.7 10/30/2017 1212   MCHC 33.4 10/30/2017 1212   RDW 15.4 (H) 10/30/2017 1212   RDW 12.9 07/10/2017 1012   LYMPHSABS 1.0 10/30/2017 1212   LYMPHSABS 1.5 07/10/2017 1012   MONOABS 0.7 10/30/2017 1212   MONOABS 0.6 07/10/2017 1012   EOSABS 0.2 10/30/2017 1212   EOSABS 0.2 07/10/2017 1012   BASOSABS 0.1 10/30/2017 1212     BASOSABS 0.0 07/10/2017 1012   -------------------------------  Imaging from last 24 hours (if applicable):  Radiology interpretation: No results found.

## 2017-11-11 NOTE — Progress Notes (Signed)
[place]  [phone] [phone]  Clinic Follow Up Note   Date of Service: 11/13/2017  CHIEF COMPLAINTS:  Follow Up metastatic colon cancer  Oncology History   T2, Colon cancer metastasized to lung [facility])   Staging form: Colon and Rectum, AJCC 7th Edition     Clinical stage from 03/30/2015: Stage Unknown ([place], N1, M1) - Unsigned       Colon cancer metastasized to lung [facility]) s/p laparoscopic assisted sigmoid colectomy 11/02/15   03/30/2015 Miscellaneous    Foundation one genomic testing showed TP53 mutation, MSI stable, low tumor mutation burden. Negative for K-ras, NRAS and BRAF      03/30/2015 Initial Biopsy    Sigmoid mass biopsy showed invasive adenocarcinoma. Cecal colon polyps showed tubular adenoma.      03/30/2015 Initial Diagnosis    Colon cancer      03/30/2015 Procedure    colonoscopy by Dr. Michail Sermon showed a fungating, infiltrative and ulcerated nonobstructing large mass in the sigmoid colon and at 20 cm proximal to the anus. The mass was partially circumferential no bleeding. A 10 mm polyps in the cecum was removed.      04/03/2015 Imaging    CT chest, abdomen and pelvis with contrast showed nodular masslike area of clinical worsening at rectosigmoid junction, tiny pericolonic lymph nodes, bilateral pulmonary nodules measuring about 1 cm.      04/12/2015 PET scan    Hypermetabolic colonic mass near rectosigmoid junction, tiny subcentimeter paracolonic lymph nodes. Bilateral pulmonary metastasis.      04/19/2015 Procedure    CT-guided lung nodule biopsy attempted, unsuccessful.      04/27/2015 - 09/14/2015 Chemotherapy    Oxaliplatin 130 mg/m on day 1, Capecitabine 2357m q12hr, 2 weeks on and one week off (only received 7 days for first cycle), oxaliplatin held on cycle 5 and dose reduced to 1096mm2, capecitabine reduced to 200053m12h D1-14, stopped per pt's request.       06/29/2015 - 10/19/2015 Chemotherapy     Panitumumab  every 2 weeks, some cycles were postponed due to pt's request, stoppe per pt's request       09/18/2015 Imaging    Pulmonary nodules are less hypermetabolic, stable size. Stable hypermetabolic portal hepatis and abdominal peritoneal ligament lymph nodes. No other new lesions.       11/02/2015 Surgery    sigmoid colon segmental resection       11/02/2015 Pathology Results    Sigmoid colon segmental resection showed adenocarcinoma, grade 3,  T2, 3 out of 13 lymph nodes were positive, surgical margins were negative. LVI(-), perineural invasion negative      11/13/2015 Imaging    CT chest, abdomen and pelvis with contrast showed postsurgical changes, mild progression of pulmonary metastasis, measuring up to 15 mm in the right lower lobe.      11/29/2015 - 12/05/2015 Radiation Therapy    SBRT to 4 lung lesions in 3 sessions       04/25/2016 - 08/05/2016 Chemotherapy    FOLFIRI every 2 weeks, and Avastin added from cycle 3, 5-FU held since cycle 2 due to poor tolerance. Chemo was stopped after 3 months due to overall poor tolerance and pt's request to stop.       06/27/2016 Imaging    CT CAP 06/27/16 IMPRESSION: Status post partial left hemicolectomy. Progressive wall thickening involving the colon near the suture line, worrisome for residual tumor. Adjacent 7 mm short axis pericolonic lymph node, possibly reflecting a nodal metastasis. Radiation changes  in the posterior right upper lobe and superior segment right lower lobe. Progressive pulmonary metastases bilaterally, measuring up to 13 mm, as above.      09/17/2016 Imaging    CT A/P/C w contrast  IMPRESSION: 1. Multifocal pulmonary nodules are not significantly changed in size compared with the previous exam. 2. Stable appearance of radiation changes within the right midlung. 3. Stable to scratch set improved appearance of wall thickening involving the colon at the level of the suture line.       11/11/2016 Imaging    CT Chest WO  Contrast 11/11/16: IMPRESSION: 1. Minimal enlargement of some pulmonary metastases. 2. Coronary artery calcification. 3. Hepatic steatosis.       02/10/2017 Imaging    CT CAP W Contrast 02/10/17 IMPRESSION: 1. Progressive metastatic disease to the thorax as demonstrated by increased size of numerous previously noted pulmonary nodules, what appears to be lymphangitic spread of disease developing in the right upper lobe and superior segment of the right lower lobe, and new 1.5 cm short axis prevascular lymph node. 2. No definite findings of metastatic disease in the abdomen or pelvis. 3. Multiple tiny nonobstructive calculi in the collecting systems of the kidneys bilaterally measuring 2-3 mm in size. No ureteral stones or findings of urinary tract obstruction are noted at this time. 4. Hepatic steatosis. 5. Aortic atherosclerosis, in addition to left anterior descending coronary artery disease. Please note that although the presence of coronary artery calcium documents the presence of coronary artery disease, the severity of this disease and any potential stenosis cannot be assessed on this non-gated CT examination. Assessment for potential risk factor modification, dietary therapy or pharmacologic therapy may be warranted, if clinically indicated. 6. There are calcifications of the aortic valve. Echocardiographic correlation for evaluation of potential valvular dysfunction may be warranted if clinically indicated.       02/27/2017 - 06/13/2017 Chemotherapy    Third-line chemotherapy Xeloda for 2 weeks on and 1 week off with Oxaliplatin every 3 weeks and Panitumumab every 2 weeks on a different day. He developed anaphylactic reaction to oxaliplatin, coded, oxaliplatin was subsequently stopped. He did not to tolerate Xeloda, plan to change to 5-FU bolus weekly, Held chemo since 03/20/17.   Restarted panitumumab every 2 weeks on 05/01/17 and ended on 06/13/17 due to disease progression         06/11/2017 Imaging    CT CAP W Contrast 06/11/17  IMPRESSION: 1. Continued marked interval progression of pulmonary metastatic disease with mediastinal and left hilar metastases evident. 2. Interval development of a 4.3 cm metastatic lesion in the dome of the liver. 3. Stable nonobstructing nephrolithiasis.      06/26/2017 -  Chemotherapy    Fourth-Line chemo leucovorin/5-fu bolus weekly with avastin every 3 weeks - leucovorin bolus starting 06/26/17        08/19/2017 Imaging    IMPRESSION: Chest Impression:  1. Interval decrease in size of rounded pulmonary nodules and consolidative mass lesions in LEFT upper lobe. 2. No new pulmonary nodule. 3. No mediastinal fat  Abdomen / Pelvis Impression:  1. Interval decrease in size enhancing lesion in the RIGHT hepatic lobe. 2. No new or progressive disease in liver. 3. No abdominal adenopathy peritoneal metastasis 4. Rectal anastomosis, stable.         HISTORY OF PRESENTING ILLNESS:  Bill Wright 57 y.o. male is here because of recently newly diagnosed colon cancer.  He has had intermittent bloody stool for 2 years, it has been mild, mixed with  stool, patient does not have any abdominal pain, constipation, change of his bowel habits, nausea, weight loss or other symptoms. He did not seek medical attention for this. He went to emergency room on 03/15/2015 for right flank pain, due to his kidney stone. CT scan incidentally found a 11 mm right lower lobe nodule and mucosal edema in the sigmoid colon. He saw his primary care physician, and was referred to GI Dr. Michail Sermon here at he underwent colonoscopy on 03/30/2015, which showed a fungating infiltrative and ulcerated nonobstructing large mass in the sigmoid colon, biopsy showed adenocarcinoma. CT chest abdomen and pelvis showed multiple lung nodules measuring about 1 cm. He was referred to surgeon Dr. Zella Richer, who referred patient to [place] for further workup of his lung  nodule and discuss chemotherapy.  He feels very well overall, denies any symptoms. 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 He never had screening colonoscopy prior the reason one, no significant past medical history, does not see doctors regularly.   CURRENT TREATMENT:  Fourth-Line chemo leucovorin/5-fu bolus weekly with avastin every 2 weeks, started 06/26/17. Will change to every other week starting 09/25/17.   INTERIM HISTORY  Bill Wright returns for follow up. He completed Cycle 2 5FU on 10/30/2017. He presents to the clinic by himself today.   He had a CT CAP with contrast completed today with results pending at this time.   On review of systems, he notes that he has a cold that started 2 weeks ago that started with wheezing, chest congestion, productive cough x thick yellow-white sputum, rhinorrhea, improved fatigue, loss of voice, and increased coughing while laying flat. He has been using his inhaler at home with temporary relief. He is full time at work and he notes that he has been able to do it without any issues. He denies hemoptysis, CP, fever, appetite change, SOB while laying flat, sore throat, and any other symptoms.      MEDICAL HISTORY:  Past Medical History:  Diagnosis Date  . Anxiety    situational due to cancer diagnosis  . Cancer [facility]) 2017   colon-chemo 09/22/15 now surgery  . Hypercholesteremia   . Kidney calculi     SURGICAL HISTORY: Past Surgical History:  Procedure Laterality Date  . COLONOSCOPY  03/30/15  . IR CV LINE INJECTION  04/09/2017  . IR FLUORO GUIDE PORT INSERTION LEFT  06/26/2017  . IR FLUORO GUIDE PORT INSERTION RIGHT  04/15/2017  . IR REMOVAL TUN ACCESS W/ PORT W/O FL MOD SED  04/15/2017  . IR REMOVAL TUN ACCESS W/ PORT W/O FL MOD SED  06/26/2017  . IR US GUIDE VASC ACCESS LEFT  06/26/2017  . IR US GUIDE VASC ACCESS RIGHT  04/15/2017  . LAPAROSCOPIC PARTIAL COLECTOMY N/A 11/02/2015   Procedure: LAPAROSCOPIC ASSISTED  SIGMOID COLECTOMY;  Surgeon: Jackolyn Confer, MD;  Location: [facility];  Service: General;  Laterality: N/A;  . LEFT HEART CATH AND CORONARY ANGIOGRAPHY N/A 03/21/2017   Procedure: LEFT HEART CATH AND CORONARY ANGIOGRAPHY;  Surgeon: Dixie Dials, MD;  Location: [place] [facility];  Service: Cardiovascular;  Laterality: N/A;  . PORTACATH PLACEMENT N/A 05/08/2016   Procedure: INSERTION PORT-A-CATH;  Surgeon: Jackolyn Confer, MD;  Location: [facility];  Service: General;  Laterality: N/A;    SOCIAL HISTORY: Social History   Social History  . Marital Status: Married    Spouse Name: N/A  . Number of Children: 3, age of 66, 66 and 75   .  Years of Education: N/A   Occupational History  . Banker for [employer] [place]    Social History Main Topics  . Smoking status: Never Smoker   . Smokeless tobacco: Not on file  . Alcohol Use: No  . Drug Use: No  . Sexual Activity: Not on file   Other Topics Concern  . Not on file   Social History Narrative    FAMILY HISTORY: Family History  Problem Relation Age of Onset  . Breast cancer Mother 93       +rad and lymph node  . Diabetes Maternal Grandmother        leading to blindness  . Obesity Maternal Aunt   . Stroke Maternal Uncle 58    ALLERGIES:  is allergic to oxaliplatin.  MEDICATIONS:  Current Outpatient Medications  Medication Sig Dispense Refill  . albuterol (VENTOLIN HFA) 108 (90 Base) MCG/ACT inhaler Inhale 1 puff into the lungs every 4 (four) hours as needed for wheezing or shortness of breath. 1 Inhaler 3  . diltiazem (CARDIZEM) 30 MG tablet Take 2 tablets (60 mg total) by mouth 2 (two) times daily. 120 tablet 1  . diphenoxylate-atropine (LOMOTIL) 2.5-0.025 MG tablet Take 1-2 tablets by mouth 4 (four) times daily as needed for diarrhea or loose stools. 40 tablet 2  . Fluticasone-Salmeterol (ADVAIR DISKUS) 250-50 MCG/DOSE AEPB Inhale 1 puff into the lungs 2 (two) times daily. (Patient taking differently: Inhale 1 puff into the lungs  daily. ) 180 each 1  . HYDROcodone-acetaminophen (NORCO/VICODIN) 5-325 MG tablet Take 1 tablet by mouth every 6 (six) hours as needed for moderate pain or severe pain. 30 tablet 0  . HYDROcodone-Homatropine 5-1.5 MG TABS Take 1 tablet by mouth every 6 (six) hours as needed. 40 each 0  . LORazepam (ATIVAN) 0.5 MG tablet Take 1 tablet (0.5 mg total) by mouth every 6 (six) hours as needed (Nausea or vomiting). 30 tablet 0  . metoprolol tartrate (LOPRESSOR) 25 MG tablet Take 1 tablet (25 mg total) by mouth 2 (two) times daily. 60 tablet 3  . mometasone (ELOCON) 0.1 % cream Apply 1 application topically daily. 45 g 2  . ondansetron (ZOFRAN) 8 MG tablet Take 1 tablet (8 mg total) by mouth 2 (two) times daily as needed (Nausea or vomiting). 30 tablet 1  . prochlorperazine (COMPAZINE) 10 MG tablet Take 1 tablet (10 mg total) by mouth every 6 (six) hours as needed (Nausea or vomiting). 30 tablet 1  . levofloxacin (LEVAQUIN) 750 MG tablet Take 1 tablet (750 mg total) by mouth daily. 7 tablet 0   No current facility-administered medications for this visit.    Facility-Administered Medications Ordered in Other Visits  Medication Dose Route Frequency Provider Last Rate Last Dose  . 0.9 %  sodium chloride infusion   Intravenous Once Truitt Merle, MD      . fluorouracil (ADRUCIL) chemo injection 1,300 mg  600 mg/m2 (Treatment Plan Recorded) Intravenous Once Truitt Merle, MD      . heparin lock flush 100 unit/mL  500 Units Intracatheter Once PRN Truitt Merle, MD      . leucovorin 1,090 mg in dextrose 5 % 250 mL infusion  500 mg/m2 (Treatment Plan Recorded) Intravenous Once Truitt Merle, MD 203 mL/hr at 11/13/17 1222 1,090 mg at 11/13/17 1222  . sodium chloride flush (NS) 0.9 % injection 10 mL  10 mL Intracatheter PRN Truitt Merle, MD        REVIEW OF SYSTEMS:  Constitutional: Denies fevers, chills or abnormal  night sweats, no weight loss. (+) significant fatigue, improved  (+) loss of taste (+) adequate appetite and weight  gain Eyes: Denies blurriness of vision, double vision or watery eyes Ears, nose, mouth, throat, and face: Denies mucositis or sore throat Respiratory: (+) wheezes. (+) productive cough x thick yellow-sputum  Cardiovascular: negative Gastrointestinal:  Denies heartburn   Lymphatics: Denies new lymphadenopathy or easy bruising Neurological:Denies numbness (+) Left leg neuropathy (+) chemo brain has improved.  MSK: normal Behavioral/Psych: Mood is stable, no new changes  All other systems were reviewed with the patient and are negative.  PHYSICAL EXAMINATION:  ECOG PERFORMANCE STATUS: 2 Vitals:   11/13/17 1034  BP: (!) 144/76  Pulse: 86  Resp: 18  Temp: 98.2 F (36.8 C)  TempSrc: Oral  SpO2: 99%  Weight: 211 lb (95.7 kg)  Height: 5' 6"  (1.676 m)    GENERAL:alert, no distress and comfortable SKIN: skin color, texture, turgor are normal. mild skin erythema on his cheeks. Diffuse acne-like skin rash on his face, scalp, neck and upper chest, no skin erythema or signs of infection.  EYES: normal, conjunctiva are pink and non-injected, sclera clear OROPHARYNX:no exudate, no erythema and lips, buccal mucosa, and tongue normal  NECK: supple, thyroid normal size, non-tender, without nodularity LYMPH:  no palpable lymphadenopathy in the cervical, axillary or inguinal LUNGS: clear to auscultation and percussion with normal breathing effort. No wheezing or crackles noted.  HEART: regular rate & rhythm and no murmurs and no lower extremity edema ABDOMEN:abdomen soft, non-tender and normal bowel sounds. The midline incision has healed well, no discharge or skin erythema  Musculoskeletal:no cyanosis of digits and no clubbing  PSYCH: alert & oriented x 3 with fluent speech NEURO: no focal motor/sensory deficits  LABORATORY DATA:  I have reviewed the data as listed CBC Latest Ref Rng & Units 11/13/2017 10/30/2017 10/16/2017  WBC 4.0 - 10.3 K/uL 3.7(L) 5.7 4.2  Hemoglobin 13.0 - 17.1 g/dL 12.5(L)  13.1 13.4  Hematocrit 38.4 - 49.9 % 37.6(L) 39.2 41.6  Platelets 140 - 400 K/uL 126(L) 178 187   CMP Latest Ref Rng & Units 11/13/2017 10/30/2017 10/16/2017  Glucose 70 - 140 mg/dL 120 134 135  BUN 7 - 26 mg/dL 9 8 8   Creatinine 0.70 - 1.30 mg/dL 0.76 0.77 0.77  Sodium 136 - 145 mmol/L 136 138 140  Potassium 3.5 - 5.1 mmol/L 4.6 4.2 4.1  Chloride 98 - 109 mmol/L 102 103 106  CO2 22 - 29 mmol/L 28 27 24   Calcium 8.4 - 10.4 mg/dL 8.9 9.4 9.0  Total Protein 6.4 - 8.3 g/dL 7.0 7.1 7.0  Total Bilirubin 0.2 - 1.2 mg/dL 0.5 0.7 0.5  Alkaline Phos 40 - 150 U/L 87 86 95  AST 5 - 34 U/L 36(H) 28 35(H)  ALT 0 - 55 U/L 35 34 44    CEA:  04/20/2016: <0.5 06/08/2015: <0.5 09/07/2015: <0.5 11/15/2015: 0.9 02/16/2016: <1.0  04/25/2016: 1.6 05/23/16: <1.00 06/20/2016: 1.05 07/22/2016: <1.00 09/17/16: <1.00 11/14/16: 1.60 02/10/17: 5.94 03/20/2017: 4.92 05/01/17: 13.29 05/30/17: 19.94 06/13/17: 27.88 07/10/17: 41.26  08/07/17: 25.45 08/21/17: 13.78 09/25/17: 13.47 10/30/17: 17.34    PATHOLOGY REPORT  Diagnosis 11/02/2015 1. Colon, segmental resection for tumor, sigmoid ADENOCARCINOMA OF THE SIGMOID COLON (2.0 CM), GRADE 3 THE TUMOR INVADES MUSCULARIS PROPRIA MARGINS OF RESECTION ARE NEGATIVE METASTATIC ADENOCARCINOMA IN THREE OF THIRTEEN LYMPH NODES (3/13) 2. Colon, resection margin (donut), distal sigmoid BENIGN COLONIC TISSUE Microscopic Comment 1. COLON AND RECTUM (INCLUDING TRANS-ANAL RESECTION): Specimen: Sigmoid Procedure:  Segmental resection Tumor site: sigmoid Specimen integrity: Intact Macroscopic intactness of mesorectum: Not applicable: x Complete: NA Near complete: NA Incomplete: NA Cannot be determined (specify): NA Macroscopic tumor perforation: Muscularis Invasive tumor: Maximum size: 2.0 cm Histologic type(s): Adenocarcinoma Histologic grade and differentiation: G3 G1: well differentiated/low grade G2: moderately differentiated/low grade G3: poorly  differentiated/high grade G4: undifferentiated/high grade Type of polyp in which invasive carcinoma arose: Tubular adenoma Microscopic extension of invasive tumor: Muscularis propria Lymph-Vascular invasion: Negative Peri-neural invasion: Negative Tumor deposit(s) (discontinuous extramural extension): Negative Resection margins: Proximal margin: Negative Distal margin: Negative Circumferential (radial) (posterior ascending, posterior descending; lateral and posterior mid-rectum; and entire lower 1/3 rectum):Negative Mesenteric margin (sigmoid and transverse): Negative Distance closest margin (if all above margins negative): 3.5 cm Trans-anal resection margins only: Deep margin: NA Mucosal Margin: NA Distance closest mucosal margin (if negative): NA Treatment effect (neo-adjuvant therapy): Partial Additional polyp(s): None Non-neoplastic findings: unremarkable Lymph nodes: number examined 13; number positive: 3 Pathologic Staging: T2, N1b, M1a     RADIOGRAPHIC STUDIES: I have personally reviewed the radiological images as listed and agreed with the findings in the report.  PET 04/12/2015 IMPRESSION: Hypermetabolic colonic mass near rectosigmoid junction, consistent with known primary colon carcinoma. Tiny sub-cm pericolonic lymph nodes in sigmoid mesocolon are too small to characterize by PET, but are suspicious for early lymph node metastases. Bilateral pulmonary metastases  PET 09/18/2015 IMPRESSION: 1. Hypermetabolic pulmonary metastases have enlarged slightly from 07/18/2015. 2. Hypermetabolic porta hepatis/abdominal peritoneal ligament lymph nodes, stable from 04/12/2015. 3. Increase in hypermetabolism associated with mesenteric haziness and nodularity. While a reactive phenomenon/panniculitis can create this in appearance, metastatic disease/lymphoproliferative disorder cannot be excluded. 4. Hepatic steatosis. 5. Bilateral nephrolithiasis.  CT chest, abdomen  and pelvis with contrast 06/27/2016 IMPRESSION: Status post partial left hemicolectomy. Progressive wall thickening involving the colon near the suture line, worrisome for residual tumor. Adjacent 7 mm short axis pericolonic lymph node, possibly reflecting a nodal metastasis. Radiation changes in the posterior right upper lobe and superior segment right lower lobe. Progressive pulmonary metastases bilaterally, measuring up to 13 mm, as above.   CT A/P/C w contrast 09/17/16:  IMPRESSION: 1. Multifocal pulmonary nodules are not significantly changed in size compared with the previous exam. 2. Stable appearance of radiation changes within the right midlung. 3. Stable to scratch set improved appearance of wall thickening involving the colon at the level of the suture line.   CT Chest WO Contrast 11/11/16: IMPRESSION: 1. Minimal enlargement of some pulmonary metastases. 2. Coronary artery calcification. 3. Hepatic steatosis.   CT CAP W Contrast 02/10/17 IMPRESSION: 1. Progressive metastatic disease to the thorax as demonstrated by increased size of numerous previously noted pulmonary nodules, what appears to be lymphangitic spread of disease developing in the right upper lobe and superior segment of the right lower lobe, and new 1.5 cm short axis prevascular lymph node. 2. No definite findings of metastatic disease in the abdomen or pelvis. 3. Multiple tiny nonobstructive calculi in the collecting systems of the kidneys bilaterally measuring 2-3 mm in size. No ureteral stones or findings of urinary tract obstruction are noted at this time. 4. Hepatic steatosis. 5. Aortic atherosclerosis, in addition to left anterior descending coronary artery disease. Please note that although the presence of coronary artery calcium documents the presence of coronary artery disease, the severity of this disease and any potential stenosis cannot be assessed on this non-gated CT examination.  Assessment for potential risk factor modification, dietary therapy or pharmacologic therapy may be warranted, if  clinically indicated. 6. There are calcifications of the aortic valve. Echocardiographic correlation for evaluation of potential valvular dysfunction may be warranted if clinically indicated.   CT CAP W Contrast 06/11/17  IMPRESSION: 1. Continued marked interval progression of pulmonary metastatic disease with mediastinal and left hilar metastases evident. 2. Interval development of a 4.3 cm metastatic lesion in the dome of the liver. 3. Stable nonobstructing nephrolithiasis.  CT ANGIO CHEST PE 06/30/17  IMPRESSION: -No definite evidence of pulmonary embolus. -Stable aortopulmonary window adenopathy is noted. -Significantly increased subcarinal adenopathy is noted concerning for worsening metastatic disease. -Continued presence of left hilar mass or adenopathy is noted, which appears to be resulting in increased postobstructive atelectasis or pneumonia. -Stable pulmonary nodules or metastatic lesions are noted in both lower lobes. -Right hepatic metastatic lesion noted on prior exam is not well visualized currently.   ASSESSMENT & PLAN:  57 y.o. male, without significant past medical history except kidney stone, presented with intermittent bloody stool for 2 years, and colonoscopy showed a large sigmoid colon mass, CT scan showed multiple (at least 4) nodules in bilateral lungs, measuring about 1 cm.  1. Sigmoid colon adenocarcinoma, pT2N1bM1, stage IV with lung mets, KRA/NRAS wild type, MSI-stable -I previously reviewed his colonoscopy, initial CT scan findings and the biopsy results in great details with patient and his wife. -he received first line chemo FOLFOX and panitumumab, followed by hemicolectomy, SBRT to 4 lung nodules, and second line chemo irinotecan and avastin, chemotherapy was finally stopped due to his poor tolerance. -I previously reviewed his surgical  pathology findings, which showed a residual T2 primary tumor, 3 lymph nodes positive, grade 3 disease, certainly high risk disease.  -We previously discussed that his disease is likely incurable, and the goal of therapy is maximum disease control and prolong his life.  -- His tumor does not contain KRAS/NRAS or BRAF mutation, so he would benefit from EGFR antibody, Panitumumab was added on from cycle 4 but tolerated poorly dur to skin rash  -he has been on and off chemo and panitumumab due to poor tolerance overall  --Unfortunately he previously developed anaphylactic reaction to oxaliplatin with cardiac arrest on 03/20/2017 and was successfully resuscitated, chemotherapy was held -no more oxaliplatin due to cardiac arrest  -He had a long chemo break after the cardiac arrest  -We reviewed his CT CAP images from 06/11/17 which shows significant growth in his pulmonary metastatic lesions and a new liver met.  -He has started weekly 5-fu bolus/leucovorin [facility] regimen) and avastin on 07/04/17 -his dyspnea has significantly improved since he started 5-FU and Avastin, he tolerating well.  -We previously reviewed CT CAP in detail which shows interval decrease in size of pulmonary nodules in left upper lobe and no new pulmonary nodules as well as interval decrease in size of enhancing right hepatic lobe lesion; no new or progressive liver disease and no abdominal adenopathy or peritoneal disease.  -He clinically is doing much better also, cough and dyspnea has resolved.  However he has been very fatigued, likely secondary to chemotherapy and less physical activity lately. -chemo was changed to every 2 weeks after a break, his fatigue has improved. But he has developed cough again, with some thick sputum reduction, and mild wheezing, no fever.  He underwent a CT restaging scan this morning, the formal report is not back, I reviewed personally, he is metastatic lung and liver disease is overall stable, with  some lesions slightly increased in size.  -Labs reviewed today and adequate  for 5-Fu and Leucovorin today  -Patient will be moving at the end of this month.  -Patient has been on every 2-3 weeks chemo treatments since March 2019 due to fatigue.  -F/u in 2 weeks    2. Cardiac arrest on 03/20/2017 -Secondary to oxaliplatin -He underwent cardiac catheterization, which was negative for coronary artery disease -He will continue follow-up with his cardiologist Dr. Dorthy Cooler  -He is on metoprolol, continue to follow cardiologist   3.  Fatigue -He has developed severe fatigue after we started weekly 5-FU chemo -Likely related to chemotherapy, less physical activity lately, and underlying malignancy -Fatigue has improved after chemo break.  I previously encouraged him to be more physically active, and he will.  4. Obesity  -I again encouraged him to eat healthy and exercise  -Given his lower appetite his has loss some weight. I previously recommended that he work on his appetite.  -The benefits of Marinol and mirtazapine were previously reviewed with him, he declined at that time.  5. Anxiety  -He has been having palpitation, sweating episodes, after his cardiac arrest event. Possible anxiety attacks  - I previously suggested he try low dose Xanax to help with his anxiety, he refuses at this time -Anxiety has improved some since he started chemo again  -He is followed by Mervin Hack for ongoing emotional support.   6. Left leg neuropathy -Possibly related to chemotherapy, also not very typical  -I previously suggested him to take over-the-counter vitamin B complex -continues, but stable. I previously encouraged him to stay active.  7. Goal of care discussion  -We again discussed the incurable nature of his cancer, although his cancer seems to be more indolent -The patient understands the goal of care is palliative. -He is full code now   8. Elevated BP, likely secondary to  Avastin -It was previously suggested to restart diltiazem and metoprolol and he was encouraged to follow up with his cardiologist.  -Will monitor his BP closely -Will refill diltiazem today (11/13/2017) -I advised the patient to follow up with a cardiologist for further evaluation of the medications.   9.  Acute bronchitis -I advised the patient to start taking mucinex for his symptoms.  -I will prescribe the patient an abx Levaquin 750 mg daily for 7 days.  -I personally reviewed his CT scan today and discussed with him.  PLAN -F/u in 2 weeks  -Refilled hycodan and cardizem today  -called in levaquin today for 7 days      I spent 30 minutes counseling the patient face to face. The total time spent in the appointment was 40 minutes and more than 50% was on counseling.  This document serves as a record of services personally performed by Truitt Merle, MD. It was created on her behalf by Steva Colder, a trained medical scribe. The creation of this record is based on the scribe's personal observations and the provider's statements to them.   I have reviewed the above documentation for accuracy and completeness, and I agree with the above.     Truitt Merle, MD 11/13/2017

## 2017-11-13 ENCOUNTER — Encounter: Payer: Self-pay | Admitting: Hematology

## 2017-11-13 ENCOUNTER — Ambulatory Visit (HOSPITAL_COMMUNITY)
Admission: RE | Admit: 2017-11-13 | Discharge: 2017-11-13 | Disposition: A | Discharge status: Home / Self Care | Payer: BLUE CROSS/BLUE SHIELD | Source: Ambulatory Visit | Attending: Nurse Practitioner | Admitting: Nurse Practitioner

## 2017-11-13 ENCOUNTER — Other Ambulatory Visit: Payer: BLUE CROSS/BLUE SHIELD

## 2017-11-13 ENCOUNTER — Inpatient Hospital Stay: Payer: BLUE CROSS/BLUE SHIELD

## 2017-11-13 ENCOUNTER — Inpatient Hospital Stay: Payer: BLUE CROSS/BLUE SHIELD | Attending: Hematology

## 2017-11-13 ENCOUNTER — Inpatient Hospital Stay (HOSPITAL_BASED_OUTPATIENT_CLINIC_OR_DEPARTMENT_OTHER): Payer: BLUE CROSS/BLUE SHIELD | Admitting: Hematology

## 2017-11-13 ENCOUNTER — Ambulatory Visit: Payer: BLUE CROSS/BLUE SHIELD | Admitting: Nurse Practitioner

## 2017-11-13 ENCOUNTER — Telehealth: Payer: Self-pay | Admitting: Hematology

## 2017-11-13 ENCOUNTER — Encounter (HOSPITAL_COMMUNITY): Payer: Self-pay

## 2017-11-13 VITALS — BP 144/76 | HR 86 | Temp 98.2°F | Resp 18 | Ht 66.0 in | Wt 211.0 lb

## 2017-11-13 DIAGNOSIS — Z5112 Encounter for antineoplastic immunotherapy: Secondary | ICD-10-CM | POA: Insufficient documentation

## 2017-11-13 DIAGNOSIS — C7801 Secondary malignant neoplasm of right lung: Secondary | ICD-10-CM

## 2017-11-13 DIAGNOSIS — R059 Cough, unspecified: Secondary | ICD-10-CM

## 2017-11-13 DIAGNOSIS — R5383 Other fatigue: Secondary | ICD-10-CM

## 2017-11-13 DIAGNOSIS — C189 Malignant neoplasm of colon, unspecified: Secondary | ICD-10-CM

## 2017-11-13 DIAGNOSIS — J209 Acute bronchitis, unspecified: Secondary | ICD-10-CM

## 2017-11-13 DIAGNOSIS — R030 Elevated blood-pressure reading, without diagnosis of hypertension: Secondary | ICD-10-CM

## 2017-11-13 DIAGNOSIS — C7800 Secondary malignant neoplasm of unspecified lung: Secondary | ICD-10-CM

## 2017-11-13 DIAGNOSIS — F419 Anxiety disorder, unspecified: Secondary | ICD-10-CM | POA: Diagnosis not present

## 2017-11-13 DIAGNOSIS — E669 Obesity, unspecified: Secondary | ICD-10-CM | POA: Diagnosis not present

## 2017-11-13 DIAGNOSIS — R918 Other nonspecific abnormal finding of lung field: Secondary | ICD-10-CM | POA: Insufficient documentation

## 2017-11-13 DIAGNOSIS — K7689 Other specified diseases of liver: Secondary | ICD-10-CM | POA: Insufficient documentation

## 2017-11-13 DIAGNOSIS — G629 Polyneuropathy, unspecified: Secondary | ICD-10-CM

## 2017-11-13 DIAGNOSIS — Z5111 Encounter for antineoplastic chemotherapy: Secondary | ICD-10-CM | POA: Insufficient documentation

## 2017-11-13 DIAGNOSIS — C187 Malignant neoplasm of sigmoid colon: Secondary | ICD-10-CM

## 2017-11-13 DIAGNOSIS — R05 Cough: Secondary | ICD-10-CM

## 2017-11-13 DIAGNOSIS — Z95828 Presence of other vascular implants and grafts: Secondary | ICD-10-CM

## 2017-11-13 LAB — CBC WITH DIFFERENTIAL/PLATELET
BASOS ABS: 0 10*3/uL (ref 0.0–0.1)
BASOS PCT: 1 %
Eosinophils Absolute: 0.1 10*3/uL (ref 0.0–0.5)
Eosinophils Relative: 3 %
HCT: 37.6 % — ABNORMAL LOW (ref 38.4–49.9)
Hemoglobin: 12.5 g/dL — ABNORMAL LOW (ref 13.0–17.1)
LYMPHS PCT: 23 %
Lymphs Abs: 0.8 10*3/uL — ABNORMAL LOW (ref 0.9–3.3)
MCH: 32.1 pg (ref 27.2–33.4)
MCHC: 33.2 g/dL (ref 32.0–36.0)
MCV: 96.6 fL (ref 79.3–98.0)
MONO ABS: 0.5 10*3/uL (ref 0.1–0.9)
Monocytes Relative: 15 %
NEUTROS ABS: 2.2 10*3/uL (ref 1.5–6.5)
Neutrophils Relative %: 58 %
PLATELETS: 126 10*3/uL — AB (ref 140–400)
RBC: 3.89 MIL/uL — AB (ref 4.20–5.82)
RDW: 15.3 % — AB (ref 11.0–14.6)
WBC: 3.7 10*3/uL — AB (ref 4.0–10.3)

## 2017-11-13 LAB — COMPREHENSIVE METABOLIC PANEL
ALBUMIN: 3.5 g/dL (ref 3.5–5.0)
ALT: 35 U/L (ref 0–55)
AST: 36 U/L — AB (ref 5–34)
Alkaline Phosphatase: 87 U/L (ref 40–150)
Anion gap: 6 (ref 3–11)
BILIRUBIN TOTAL: 0.5 mg/dL (ref 0.2–1.2)
BUN: 9 mg/dL (ref 7–26)
CO2: 28 mmol/L (ref 22–29)
CREATININE: 0.76 mg/dL (ref 0.70–1.30)
Calcium: 8.9 mg/dL (ref 8.4–10.4)
Chloride: 102 mmol/L (ref 98–109)
GFR calc Af Amer: >60 mL/min (ref >60)
GFR calc non Af Amer: >60 mL/min (ref >60)
GLUCOSE: 120 mg/dL (ref 70–140)
POTASSIUM: 4.6 mmol/L (ref 3.5–5.1)
Sodium: 136 mmol/L (ref 136–145)
Total Protein: 7 g/dL (ref 6.4–8.3)

## 2017-11-13 MED ORDER — PROCHLORPERAZINE MALEATE 10 MG PO TABS
ORAL_TABLET | ORAL | Status: AC
  Filled 2017-11-13: qty 1

## 2017-11-13 MED ORDER — ACETAMINOPHEN 325 MG PO TABS
ORAL_TABLET | ORAL | Status: AC
  Filled 2017-11-13: qty 2

## 2017-11-13 MED ORDER — IOPAMIDOL (ISOVUE-300) INJECTION 61%
100.0000 mL | Freq: Once | INTRAVENOUS | Status: AC | PRN
  Administered 2017-11-13: 100 mL via INTRAVENOUS

## 2017-11-13 MED ORDER — DILTIAZEM HCL 30 MG PO TABS: 60.0000 mg | ORAL_TABLET | Freq: Two times a day (BID) | ORAL | 1 refills | Status: AC

## 2017-11-13 MED ORDER — HEPARIN SOD (PORK) LOCK FLUSH 100 UNIT/ML IV SOLN
500.0000 [IU] | Freq: Once | INTRAVENOUS | Status: AC | PRN
  Administered 2017-11-13: 500 [IU]
  Filled 2017-11-13: qty 5

## 2017-11-13 MED ORDER — SODIUM CHLORIDE 0.9 % IV SOLN
5.0000 mg/kg | Freq: Once | INTRAVENOUS | Status: AC
  Administered 2017-11-13: 500 mg via INTRAVENOUS
  Filled 2017-11-13: qty 4

## 2017-11-13 MED ORDER — ONDANSETRON HCL 4 MG/2ML IJ SOLN
8.0000 mg | Freq: Once | INTRAMUSCULAR | Status: AC
  Administered 2017-11-13: 8 mg via INTRAVENOUS

## 2017-11-13 MED ORDER — SODIUM CHLORIDE 0.9 % IV SOLN: Freq: Once | INTRAVENOUS | Status: AC

## 2017-11-13 MED ORDER — PROCHLORPERAZINE MALEATE 10 MG PO TABS
10.0000 mg | ORAL_TABLET | Freq: Once | ORAL | Status: AC
  Administered 2017-11-13: 10 mg via ORAL

## 2017-11-13 MED ORDER — LEVOFLOXACIN 750 MG PO TABS: 750.0000 mg | ORAL_TABLET | Freq: Every day | ORAL | 0 refills | Status: AC

## 2017-11-13 MED ORDER — SODIUM CHLORIDE 0.9% FLUSH
10.0000 mL | INTRAVENOUS | Status: DC | PRN
  Administered 2017-11-13: 10 mL
  Filled 2017-11-13: qty 10

## 2017-11-13 MED ORDER — LEUCOVORIN CALCIUM INJECTION 350 MG
500.0000 mg/m2 | Freq: Once | INTRAVENOUS | Status: AC
  Administered 2017-11-13: 1090 mg via INTRAVENOUS
  Filled 2017-11-13: qty 54.5

## 2017-11-13 MED ORDER — SODIUM CHLORIDE 0.9% FLUSH
10.0000 mL | INTRAVENOUS | Status: DC | PRN
  Administered 2017-11-13: 10 mL via INTRAVENOUS
  Filled 2017-11-13: qty 10

## 2017-11-13 MED ORDER — ONDANSETRON HCL 4 MG/2ML IJ SOLN
INTRAMUSCULAR | Status: AC
  Filled 2017-11-13: qty 4

## 2017-11-13 MED ORDER — SODIUM CHLORIDE 0.9 % IV SOLN
Freq: Once | INTRAVENOUS | Status: AC
  Administered 2017-11-13: 12:00:00 via INTRAVENOUS

## 2017-11-13 MED ORDER — HYDROCODONE-HOMATROPINE 5-1.5 MG PO TABS: 1.0000 | ORAL_TABLET | Freq: Four times a day (QID) | ORAL | 0 refills | Status: DC | PRN

## 2017-11-13 MED ORDER — FLUOROURACIL CHEMO INJECTION 2.5 GM/50ML
600.0000 mg/m2 | Freq: Once | INTRAVENOUS | Status: AC
  Administered 2017-11-13: 1300 mg via INTRAVENOUS
  Filled 2017-11-13: qty 26

## 2017-11-13 NOTE — Patient Instructions (Signed)

## 2017-11-13 NOTE — Telephone Encounter (Signed)
NO LOS 5/9

## 2017-11-22 IMAGING — DX DG CHEST 1V PORT
1 series · 1 of 1 positions shown · non-contrast
Comparison: 04/05/2016

CLINICAL DATA: Port-A-Cath placement

EXAM:
PORTABLE CHEST 1 VIEW

[chest ap]
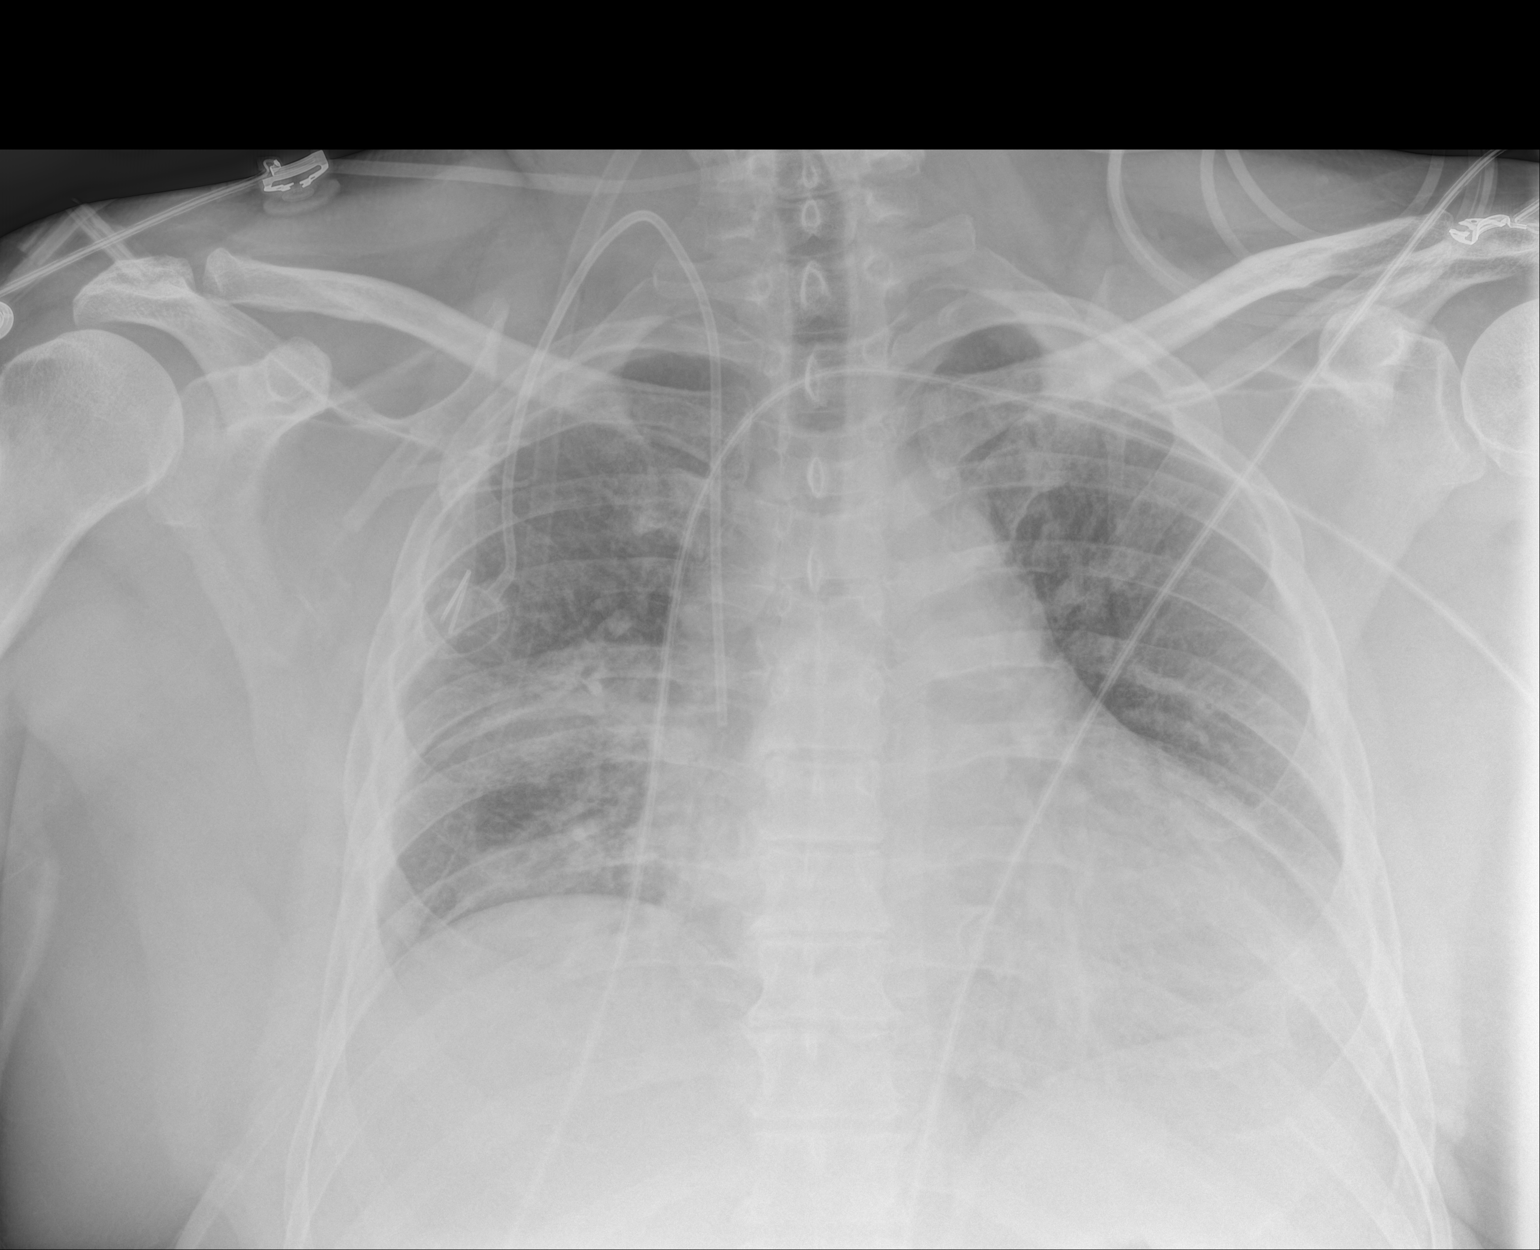

[1 of 1 positions shown; findings below may reference images not displayed]

FINDINGS: Borderline cardiomegaly. No pulmonary edema. There is right IJ
Port-A-Cath with tip in SVC right atrium junction. Streaky
infiltrate noted right perihilar and right base. There is no
pneumothorax.
IMPRESSION: No pulmonary edema. Right IJ Port-A-Cath in place. Streaky
infiltrate noted right perihilar and right base.

## 2017-11-27 ENCOUNTER — Inpatient Hospital Stay (HOSPITAL_BASED_OUTPATIENT_CLINIC_OR_DEPARTMENT_OTHER): Payer: BLUE CROSS/BLUE SHIELD | Admitting: Nurse Practitioner

## 2017-11-27 ENCOUNTER — Other Ambulatory Visit: Payer: BLUE CROSS/BLUE SHIELD

## 2017-11-27 ENCOUNTER — Encounter: Payer: Self-pay | Admitting: Nurse Practitioner

## 2017-11-27 ENCOUNTER — Inpatient Hospital Stay: Payer: BLUE CROSS/BLUE SHIELD

## 2017-11-27 ENCOUNTER — Telehealth: Payer: Self-pay | Admitting: Nurse Practitioner

## 2017-11-27 ENCOUNTER — Ambulatory Visit: Payer: BLUE CROSS/BLUE SHIELD | Admitting: Hematology

## 2017-11-27 VITALS — BP 138/87 | HR 78

## 2017-11-27 DIAGNOSIS — R634 Abnormal weight loss: Secondary | ICD-10-CM

## 2017-11-27 DIAGNOSIS — C187 Malignant neoplasm of sigmoid colon: Secondary | ICD-10-CM | POA: Diagnosis not present

## 2017-11-27 DIAGNOSIS — C7801 Secondary malignant neoplasm of right lung: Secondary | ICD-10-CM

## 2017-11-27 DIAGNOSIS — R05 Cough: Secondary | ICD-10-CM

## 2017-11-27 DIAGNOSIS — C7800 Secondary malignant neoplasm of unspecified lung: Secondary | ICD-10-CM | POA: Diagnosis not present

## 2017-11-27 DIAGNOSIS — E669 Obesity, unspecified: Secondary | ICD-10-CM | POA: Diagnosis not present

## 2017-11-27 DIAGNOSIS — F419 Anxiety disorder, unspecified: Secondary | ICD-10-CM

## 2017-11-27 DIAGNOSIS — R062 Wheezing: Secondary | ICD-10-CM | POA: Diagnosis not present

## 2017-11-27 DIAGNOSIS — C189 Malignant neoplasm of colon, unspecified: Secondary | ICD-10-CM

## 2017-11-27 DIAGNOSIS — R5383 Other fatigue: Secondary | ICD-10-CM | POA: Diagnosis not present

## 2017-11-27 DIAGNOSIS — R059 Cough, unspecified: Secondary | ICD-10-CM

## 2017-11-27 DIAGNOSIS — Z95828 Presence of other vascular implants and grafts: Secondary | ICD-10-CM

## 2017-11-27 LAB — CBC WITH DIFFERENTIAL/PLATELET
BASOS PCT: 1 %
Basophils Absolute: 0 10*3/uL (ref 0.0–0.1)
EOS ABS: 0.2 10*3/uL (ref 0.0–0.5)
EOS PCT: 4 %
HCT: 40.1 % (ref 38.4–49.9)
HEMOGLOBIN: 13.2 g/dL (ref 13.0–17.1)
LYMPHS ABS: 1 10*3/uL (ref 0.9–3.3)
Lymphocytes Relative: 20 %
MCH: 31.6 pg (ref 27.2–33.4)
MCHC: 33 g/dL (ref 32.0–36.0)
MCV: 95.7 fL (ref 79.3–98.0)
MONO ABS: 0.5 10*3/uL (ref 0.1–0.9)
Monocytes Relative: 11 %
NEUTROS PCT: 64 %
Neutro Abs: 3.2 10*3/uL (ref 1.5–6.5)
Platelets: 180 10*3/uL (ref 140–400)
RBC: 4.19 MIL/uL — ABNORMAL LOW (ref 4.20–5.82)
RDW: 15.6 % — AB (ref 11.0–14.6)
WBC: 4.9 10*3/uL (ref 4.0–10.3)

## 2017-11-27 LAB — COMPREHENSIVE METABOLIC PANEL
ALBUMIN: 3.7 g/dL (ref 3.5–5.0)
ALK PHOS: 86 U/L (ref 40–150)
ALT: 32 U/L (ref 0–55)
AST: 30 U/L (ref 5–34)
Anion gap: 7 (ref 3–11)
BUN: 9 mg/dL (ref 7–26)
CALCIUM: 9.1 mg/dL (ref 8.4–10.4)
CHLORIDE: 103 mmol/L (ref 98–109)
CO2: 27 mmol/L (ref 22–29)
Creatinine, Ser: 0.76 mg/dL (ref 0.70–1.30)
GFR calc Af Amer: >60 mL/min (ref >60)
GFR calc non Af Amer: >60 mL/min (ref >60)
GLUCOSE: 135 mg/dL (ref 70–140)
Potassium: 3.9 mmol/L (ref 3.5–5.1)
SODIUM: 137 mmol/L (ref 136–145)
Total Bilirubin: 0.8 mg/dL (ref 0.2–1.2)
Total Protein: 7.3 g/dL (ref 6.4–8.3)

## 2017-11-27 LAB — CEA (IN HOUSE-CHCC): CEA (CHCC-IN HOUSE): 26.72 ng/mL — AB (ref 0.00–5.00)

## 2017-11-27 MED ORDER — SODIUM CHLORIDE 0.9 % IV SOLN
5.0000 mg/kg | Freq: Once | INTRAVENOUS | Status: AC
  Administered 2017-11-27: 500 mg via INTRAVENOUS
  Filled 2017-11-27: qty 16

## 2017-11-27 MED ORDER — ONDANSETRON HCL 4 MG/2ML IJ SOLN
8.0000 mg | Freq: Once | INTRAMUSCULAR | Status: AC
  Administered 2017-11-27: 8 mg via INTRAVENOUS

## 2017-11-27 MED ORDER — HYDROCODONE-ACETAMINOPHEN 5-325 MG PO TABS: 1.0000 | ORAL_TABLET | Freq: Four times a day (QID) | ORAL | 0 refills | Status: AC | PRN

## 2017-11-27 MED ORDER — PROCHLORPERAZINE MALEATE 10 MG PO TABS
ORAL_TABLET | ORAL | Status: AC
  Filled 2017-11-27: qty 1

## 2017-11-27 MED ORDER — HYDROCODONE-HOMATROPINE 5-1.5 MG PO TABS: 1.0000 | ORAL_TABLET | Freq: Four times a day (QID) | ORAL | 0 refills | Status: AC | PRN

## 2017-11-27 MED ORDER — PROCHLORPERAZINE MALEATE 10 MG PO TABS
10.0000 mg | ORAL_TABLET | Freq: Once | ORAL | Status: AC
  Administered 2017-11-27: 10 mg via ORAL

## 2017-11-27 MED ORDER — FLUOROURACIL CHEMO INJECTION 2.5 GM/50ML
600.0000 mg/m2 | Freq: Once | INTRAVENOUS | Status: AC
Start: 2017-11-27 — End: 2017-11-27
  Administered 2017-11-27: 1300 mg via INTRAVENOUS
  Filled 2017-11-27: qty 26

## 2017-11-27 MED ORDER — SODIUM CHLORIDE 0.9 % IV SOLN
Freq: Once | INTRAVENOUS | Status: AC
  Administered 2017-11-27: 13:00:00 via INTRAVENOUS

## 2017-11-27 MED ORDER — LEUCOVORIN CALCIUM INJECTION 350 MG
500.0000 mg/m2 | Freq: Once | INTRAVENOUS | Status: AC
  Administered 2017-11-27: 1090 mg via INTRAVENOUS
  Filled 2017-11-27: qty 54.5

## 2017-11-27 MED ORDER — SODIUM CHLORIDE 0.9% FLUSH
10.0000 mL | INTRAVENOUS | Status: DC | PRN
  Administered 2017-11-27: 10 mL
  Filled 2017-11-27: qty 10

## 2017-11-27 MED ORDER — ONDANSETRON HCL 4 MG/2ML IJ SOLN
INTRAMUSCULAR | Status: AC
  Filled 2017-11-27: qty 4

## 2017-11-27 MED ORDER — FLUTICASONE-SALMETEROL 250-50 MCG/DOSE IN AEPB: 1.0000 | INHALATION_SPRAY | Freq: Every day | RESPIRATORY_TRACT | 2 refills | Status: AC

## 2017-11-27 MED ORDER — HEPARIN SOD (PORK) LOCK FLUSH 100 UNIT/ML IV SOLN
500.0000 [IU] | Freq: Once | INTRAVENOUS | Status: AC | PRN
  Administered 2017-11-27: 500 [IU]
  Filled 2017-11-27: qty 5

## 2017-11-27 MED ORDER — SODIUM CHLORIDE 0.9% FLUSH
10.0000 mL | INTRAVENOUS | Status: DC | PRN
  Administered 2017-11-27: 10 mL via INTRAVENOUS
  Filled 2017-11-27: qty 10

## 2017-11-27 NOTE — Patient Instructions (Signed)
[place] Discharge Instructions for Patients Receiving Chemotherapy  Today you received the following chemotherapy agents Leucovorin,Avastin,Adrucil  To help prevent nausea and vomiting after your treatment, we encourage you to take your nausea medication as directed   If you develop nausea and vomiting that is not controlled by your nausea medication, call the clinic.   BELOW ARE SYMPTOMS THAT SHOULD BE REPORTED IMMEDIATELY:  *FEVER GREATER THAN 100.5 F  *CHILLS WITH OR WITHOUT FEVER  NAUSEA AND VOMITING THAT IS NOT CONTROLLED WITH YOUR NAUSEA MEDICATION  *UNUSUAL SHORTNESS OF BREATH  *UNUSUAL BRUISING OR BLEEDING  TENDERNESS IN MOUTH AND THROAT WITH OR WITHOUT PRESENCE OF ULCERS  *URINARY PROBLEMS  *BOWEL PROBLEMS  UNUSUAL RASH Items with * indicate a potential emergency and should be followed up as soon as possible.  Feel free to call the clinic should you have any questions or concerns. The clinic phone number is [phone].  Please show the [place] at check-in to the Emergency Department and triage nurse.

## 2017-11-27 NOTE — Progress Notes (Addendum)
[facility]  [phone] [phone]  Clinic Follow up Note   Patient Care Team: Lujean Amel, MD as PCP - General (Family Medicine) Wilford Corner, MD as Consulting Physician (Gastroenterology) Jackolyn Confer, MD as Consulting Physician (General Surgery) Truitt Merle, MD as Consulting Physician (Hematology) Dixie Dials, MD as Consulting Physician (Cardiology) 11/27/2017  SUMMARY OF ONCOLOGIC HISTORY: Oncology History   T2, Colon cancer metastasized to lung [facility])   Staging form: Colon and Rectum, AJCC 7th Edition     Clinical stage from 03/30/2015: Stage Unknown ([place], N1, M1) - Unsigned       Colon cancer metastasized to lung [facility]) s/p laparoscopic assisted sigmoid colectomy 11/02/15   03/30/2015 Miscellaneous    Foundation one genomic testing showed TP53 mutation, MSI stable, low tumor mutation burden. Negative for K-ras, NRAS and BRAF      03/30/2015 Initial Biopsy    Sigmoid mass biopsy showed invasive adenocarcinoma. Cecal colon polyps showed tubular adenoma.      03/30/2015 Initial Diagnosis    Colon cancer      03/30/2015 Procedure    colonoscopy by Dr. Michail Sermon showed a fungating, infiltrative and ulcerated nonobstructing large mass in the sigmoid colon and at 20 cm proximal to the anus. The mass was partially circumferential no bleeding. A 10 mm polyps in the cecum was removed.      04/03/2015 Imaging    CT chest, abdomen and pelvis with contrast showed nodular masslike area of clinical worsening at rectosigmoid junction, tiny pericolonic lymph nodes, bilateral pulmonary nodules measuring about 1 cm.      04/12/2015 PET scan    Hypermetabolic colonic mass near rectosigmoid junction, tiny subcentimeter paracolonic lymph nodes. Bilateral pulmonary metastasis.      04/19/2015 Procedure    CT-guided lung nodule biopsy attempted, unsuccessful.      04/27/2015 - 09/14/2015 Chemotherapy    Oxaliplatin 130 mg/m on day 1, Capecitabine  2357m q12hr, 2 weeks on and one week off (only received 7 days for first cycle), oxaliplatin held on cycle 5 and dose reduced to 1061mm2, capecitabine reduced to 200038m12h D1-14, stopped per pt's request.       06/29/2015 - 10/19/2015 Chemotherapy     Panitumumab every 2 weeks, some cycles were postponed due to pt's request, stoppe per pt's request       09/18/2015 Imaging    Pulmonary nodules are less hypermetabolic, stable size. Stable hypermetabolic portal hepatis and abdominal peritoneal ligament lymph nodes. No other new lesions.       11/02/2015 Surgery    sigmoid colon segmental resection       11/02/2015 Pathology Results    Sigmoid colon segmental resection showed adenocarcinoma, grade 3,  T2, 3 out of 13 lymph nodes were positive, surgical margins were negative. LVI(-), perineural invasion negative      11/13/2015 Imaging    CT chest, abdomen and pelvis with contrast showed postsurgical changes, mild progression of pulmonary metastasis, measuring up to 15 mm in the right lower lobe.      11/29/2015 - 12/05/2015 Radiation Therapy    SBRT to 4 lung lesions in 3 sessions       04/25/2016 - 08/05/2016 Chemotherapy    FOLFIRI every 2 weeks, and Avastin added from cycle 3, 5-FU held since cycle 2 due to poor tolerance. Chemo was stopped after 3 months due to overall poor tolerance and pt's request to stop.       06/27/2016 Imaging    CT CAP 06/27/16 IMPRESSION: Status  post partial left hemicolectomy. Progressive wall thickening involving the colon near the suture line, worrisome for residual tumor. Adjacent 7 mm short axis pericolonic lymph node, possibly reflecting a nodal metastasis. Radiation changes in the posterior right upper lobe and superior segment right lower lobe. Progressive pulmonary metastases bilaterally, measuring up to 13 mm, as above.      09/17/2016 Imaging    CT A/P/C w contrast  IMPRESSION: 1. Multifocal pulmonary nodules are not significantly changed  in size compared with the previous exam. 2. Stable appearance of radiation changes within the right midlung. 3. Stable to scratch set improved appearance of wall thickening involving the colon at the level of the suture line.       11/11/2016 Imaging    CT Chest WO Contrast 11/11/16: IMPRESSION: 1. Minimal enlargement of some pulmonary metastases. 2. Coronary artery calcification. 3. Hepatic steatosis.       02/10/2017 Imaging    CT CAP W Contrast 02/10/17 IMPRESSION: 1. Progressive metastatic disease to the thorax as demonstrated by increased size of numerous previously noted pulmonary nodules, what appears to be lymphangitic spread of disease developing in the right upper lobe and superior segment of the right lower lobe, and new 1.5 cm short axis prevascular lymph node. 2. No definite findings of metastatic disease in the abdomen or pelvis. 3. Multiple tiny nonobstructive calculi in the collecting systems of the kidneys bilaterally measuring 2-3 mm in size. No ureteral stones or findings of urinary tract obstruction are noted at this time. 4. Hepatic steatosis. 5. Aortic atherosclerosis, in addition to left anterior descending coronary artery disease. Please note that although the presence of coronary artery calcium documents the presence of coronary artery disease, the severity of this disease and any potential stenosis cannot be assessed on this non-gated CT examination. Assessment for potential risk factor modification, dietary therapy or pharmacologic therapy may be warranted, if clinically indicated. 6. There are calcifications of the aortic valve. Echocardiographic correlation for evaluation of potential valvular dysfunction may be warranted if clinically indicated.       02/27/2017 - 06/13/2017 Chemotherapy    Third-line chemotherapy Xeloda for 2 weeks on and 1 week off with Oxaliplatin every 3 weeks and Panitumumab every 2 weeks on a different day. He developed  anaphylactic reaction to oxaliplatin, coded, oxaliplatin was subsequently stopped. He did not to tolerate Xeloda, plan to change to 5-FU bolus weekly, Held chemo since 03/20/17.   Restarted panitumumab every 2 weeks on 05/01/17 and ended on 06/13/17 due to disease progression        06/11/2017 Imaging    CT CAP W Contrast 06/11/17  IMPRESSION: 1. Continued marked interval progression of pulmonary metastatic disease with mediastinal and left hilar metastases evident. 2. Interval development of a 4.3 cm metastatic lesion in the dome of the liver. 3. Stable nonobstructing nephrolithiasis.      06/26/2017 -  Chemotherapy    Fourth-Line chemo leucovorin/5-fu bolus weekly with avastin every 3 weeks - leucovorin bolus starting 06/26/17        08/19/2017 Imaging    IMPRESSION: Chest Impression:  1. Interval decrease in size of rounded pulmonary nodules and consolidative mass lesions in LEFT upper lobe. 2. No new pulmonary nodule. 3. No mediastinal fat  Abdomen / Pelvis Impression:  1. Interval decrease in size enhancing lesion in the RIGHT hepatic lobe. 2. No new or progressive disease in liver. 3. No abdominal adenopathy peritoneal metastasis 4. Rectal anastomosis, stable.       CURRENT TREATMENT:  Fourth-Line  chemo leucovorin/5-fu bolus weekly with avastin every 2 weeks, started 06/26/17. Will change to every other week starting 09/25/17.   INTERVAL HISTORY: Mr. Skalla returns for follow up as scheduled prior to next cycle 5FU bolus/leucovorin/avastin. He completed last cycle on 11/13/17. He continues to work full time awaiting his transfer to [facility]; he is requesting referral to [facility] in [place].  He plans to move his family next week but will remain working here for a few more weeks.  He has intermittent fatigue with difficulty getting started in the morning. Once he gets going and gets to work he his fine. Appetite fluctuates with decreased po intake as the day  progresses. Denies n/v/c/d. He has increased nonproductive cough with wheezing and "gurgling" over the last few weeks. He was recently treated for bronchitis with levaquin x1 week on 11/13/17.  Has not used albuterol recently.  No Advair in last 6 months.  Has not tried OTC medication. Denies dyspnea, chest pain, or hemoptysis.   REVIEW OF SYSTEMS:   Constitutional: Denies fevers, chills (+) abnormal weight loss (+) appetite decreases throughout the day (+) morning fatigue  Eyes: Denies blurriness of vision Ears, nose, mouth, throat, and face: Denies mucositis or sore throat (+) hoarseness Respiratory: Denies dyspnea, hemoptysis (+) increased nonproductive cough (+) wheezes (+) "gurgling "  Cardiovascular: Denies palpitation, chest discomfort or lower extremity swelling Gastrointestinal:  Denies nausea, vomiting, constipation, diarrhea, heartburn or change in bowel habits Skin: Denies abnormal skin rashes Lymphatics: Denies new lymphadenopathy or easy bruising Neurological:Denies numbness, tingling or new weaknesses Behavioral/Psych: Mood is stable, no new changes  All other systems were reviewed with the patient and are negative.  MEDICAL HISTORY:  Past Medical History:  Diagnosis Date  . Anxiety    situational due to cancer diagnosis  . Cancer [facility]) 2017   colon-chemo 09/22/15 now surgery  . Hypercholesteremia   . Kidney calculi     SURGICAL HISTORY: Past Surgical History:  Procedure Laterality Date  . COLONOSCOPY  03/30/15  . IR CV LINE INJECTION  04/09/2017  . IR FLUORO GUIDE PORT INSERTION LEFT  06/26/2017  . IR FLUORO GUIDE PORT INSERTION RIGHT  04/15/2017  . IR REMOVAL TUN ACCESS W/ PORT W/O FL MOD SED  04/15/2017  . IR REMOVAL TUN ACCESS W/ PORT W/O FL MOD SED  06/26/2017  . IR US GUIDE VASC ACCESS LEFT  06/26/2017  . IR US GUIDE VASC ACCESS RIGHT  04/15/2017  . LAPAROSCOPIC PARTIAL COLECTOMY N/A 11/02/2015   Procedure: LAPAROSCOPIC ASSISTED SIGMOID COLECTOMY;  Surgeon: Jackolyn Confer, MD;  Location: [facility];  Service: General;  Laterality: N/A;  . LEFT HEART CATH AND CORONARY ANGIOGRAPHY N/A 03/21/2017   Procedure: LEFT HEART CATH AND CORONARY ANGIOGRAPHY;  Surgeon: Dixie Dials, MD;  Location: [facility];  Service: Cardiovascular;  Laterality: N/A;  . PORTACATH PLACEMENT N/A 05/08/2016   Procedure: INSERTION PORT-A-CATH;  Surgeon: Jackolyn Confer, MD;  Location: [facility];  Service: General;  Laterality: N/A;    I have reviewed the social history and family history with the patient and they are unchanged from previous note.  ALLERGIES:  is allergic to oxaliplatin.  MEDICATIONS:  Current Outpatient Medications  Medication Sig Dispense Refill  . albuterol (VENTOLIN HFA) 108 (90 Base) MCG/ACT inhaler Inhale 1 puff into the lungs every 4 (four) hours as needed for wheezing or shortness of breath. 1 Inhaler 3  . HYDROcodone-acetaminophen (NORCO/VICODIN) 5-325 MG tablet Take 1 tablet by mouth every 6 (six) hours as needed  for moderate pain or severe pain. 30 tablet 0  . metoprolol tartrate (LOPRESSOR) 25 MG tablet Take 1 tablet (25 mg total) by mouth 2 (two) times daily. 60 tablet 3  . diltiazem (CARDIZEM) 30 MG tablet Take 2 tablets (60 mg total) by mouth 2 (two) times daily. 120 tablet 1  . diphenoxylate-atropine (LOMOTIL) 2.5-0.025 MG tablet Take 1-2 tablets by mouth 4 (four) times daily as needed for diarrhea or loose stools. 40 tablet 2  . Fluticasone-Salmeterol (ADVAIR DISKUS) 250-50 MCG/DOSE AEPB Inhale 1 puff into the lungs daily. 60 each 2  . HYDROcodone-Homatropine 5-1.5 MG TABS Take 1 tablet by mouth every 6 (six) hours as needed. 40 each 0  . levofloxacin (LEVAQUIN) 750 MG tablet Take 1 tablet (750 mg total) by mouth daily. 7 tablet 0  . LORazepam (ATIVAN) 0.5 MG tablet Take 1 tablet (0.5 mg total) by mouth every 6 (six) hours as needed (Nausea or vomiting). 30 tablet 0  . mometasone (ELOCON) 0.1 % cream Apply 1 application topically daily. 45 g 2  .  ondansetron (ZOFRAN) 8 MG tablet Take 1 tablet (8 mg total) by mouth 2 (two) times daily as needed (Nausea or vomiting). 30 tablet 1  . prochlorperazine (COMPAZINE) 10 MG tablet Take 1 tablet (10 mg total) by mouth every 6 (six) hours as needed (Nausea or vomiting). 30 tablet 1   No current facility-administered medications for this visit.    Facility-Administered Medications Ordered in Other Visits  Medication Dose Route Frequency Provider Last Rate Last Dose  . heparin lock flush 100 unit/mL  500 Units Intracatheter Once PRN Truitt Merle, MD      . sodium chloride flush (NS) 0.9 % injection 10 mL  10 mL Intracatheter PRN Truitt Merle, MD        PHYSICAL EXAMINATION: ECOG PERFORMANCE STATUS: 1 - Symptomatic but completely ambulatory  Vitals:   11/27/17 1100  BP: 117/83  Pulse: 81  Resp: 18  Temp: 98.5 F (36.9 C)  SpO2: 100%   Filed Weights   11/27/17 1100  Weight: 203 lb (92.1 kg)    GENERAL:alert, no distress and comfortable SKIN: no rashes or significant lesions EYES: normal, Conjunctiva are pink and non-injected, sclera clear OROPHARYNX: No thrush or ulcers LYMPH:  no palpable cervical or supraclavicular lymphadenopathy LUNGS: clear to auscultation with normal breathing effort.  No adventitious breath sounds HEART: regular rate & rhythm and no murmurs and no lower extremity edema ABDOMEN:abdomen soft, non-tender and normal bowel sounds Musculoskeletal:no cyanosis of digits and no clubbing  NEURO: alert & oriented x 3 with fluent speech, no focal motor/sensory deficits PAC without erythema  LABORATORY DATA:  I have reviewed the data as listed CBC Latest Ref Rng & Units 11/27/2017 11/13/2017 10/30/2017  WBC 4.0 - 10.3 K/uL 4.9 3.7(L) 5.7  Hemoglobin 13.0 - 17.1 g/dL 13.2 12.5(L) 13.1  Hematocrit 38.4 - 49.9 % 40.1 37.6(L) 39.2  Platelets 140 - 400 K/uL 180 126(L) 178     CMP Latest Ref Rng & Units 11/27/2017 11/13/2017 10/30/2017  Glucose 70 - 140 mg/dL 135 120 134  BUN 7 -  26 mg/dL 9 9 8   Creatinine 0.70 - 1.30 mg/dL 0.76 0.76 0.77  Sodium 136 - 145 mmol/L 137 136 138  Potassium 3.5 - 5.1 mmol/L 3.9 4.6 4.2  Chloride 98 - 109 mmol/L 103 102 103  CO2 22 - 29 mmol/L 27 28 27   Calcium 8.4 - 10.4 mg/dL 9.1 8.9 9.4  Total Protein 6.4 - 8.3 g/dL 7.3  7.0 7.1  Total Bilirubin 0.2 - 1.2 mg/dL 0.8 0.5 0.7  Alkaline Phos 40 - 150 U/L 86 87 86  AST 5 - 34 U/L 30 36(H) 28  ALT 0 - 55 U/L 32 35 34   CEA:  04/20/2016: <0.5 06/08/2015: <0.5 09/07/2015: <0.5 11/15/2015: 0.9 02/16/2016: <1.0  04/25/2016: 1.6 05/23/16: <1.00 06/20/2016: 1.05 07/22/2016: <1.00 09/17/16: <1.00 11/14/16: 1.60 02/10/17: 5.94 03/20/2017: 4.92 05/01/17: 13.29 05/30/17: 19.94 06/13/17: 27.88 07/10/17: 41.26  08/07/17: 25.45 08/21/17: 13.78 09/25/17: 13.47 10/30/17: 17.34 11/27/2017: 26.72   PATHOLOGY REPORT  Diagnosis 11/02/2015 1. Colon, segmental resection for tumor, sigmoid ADENOCARCINOMA OF THE SIGMOID COLON (2.0 CM), GRADE 3 THE TUMOR INVADES MUSCULARIS PROPRIA MARGINS OF RESECTION ARE NEGATIVE METASTATIC ADENOCARCINOMA IN THREE OF THIRTEEN LYMPH NODES (3/13) 2. Colon, resection margin (donut), distal sigmoid BENIGN COLONIC TISSUE Microscopic Comment 1. COLON AND RECTUM (INCLUDING TRANS-ANAL RESECTION): Specimen: Sigmoid Procedure: Segmental resection Tumor site: sigmoid Specimen integrity: Intact Macroscopic intactness of mesorectum: Not applicable: x Complete: NA Near complete: NA Incomplete: NA Cannot be determined (specify): NA Macroscopic tumor perforation: Muscularis Invasive tumor: Maximum size: 2.0 cm Histologic type(s): Adenocarcinoma Histologic grade and differentiation: G3 G1: well differentiated/low grade G2: moderately differentiated/low grade G3: poorly differentiated/high grade G4: undifferentiated/high grade Type of polyp in which invasive carcinoma arose: Tubular adenoma Microscopic extension of invasive tumor: Muscularis propria Lymph-Vascular  invasion: Negative Peri-neural invasion: Negative Tumor deposit(s) (discontinuous extramural extension): Negative Resection margins: Proximal margin: Negative Distal margin: Negative Circumferential (radial) (posterior ascending, posterior descending; lateral and posterior mid-rectum; and entire lower 1/3 rectum):Negative Mesenteric margin (sigmoid and transverse): Negative Distance closest margin (if all above margins negative): 3.5 cm Trans-anal resection margins only: Deep margin: NA Mucosal Margin: NA Distance closest mucosal margin (if negative): NA Treatment effect (neo-adjuvant therapy): Partial Additional polyp(s): None Non-neoplastic findings: unremarkable Lymph nodes: number examined 13; number positive: 3 Pathologic Staging: T2, N1b, M1a      RADIOGRAPHIC STUDIES: I have personally reviewed the radiological images as listed and agreed with the findings in the report. No results found.   ASSESSMENT & PLAN: 57 y.o. male, without significant past medical history except kidney stone, presented with intermittent bloody stool for 2 years, and colonoscopy showed a large sigmoid colon mass, CT scan showed multiple (at least 4) nodules in bilateral lungs, measuring about 1 cm.  1. Sigmoid colon adenocarcinoma, pT2N1bM1, stage IV with lung mets, KRA/NRAS wild type, MSI-stable -Mr. Burek appears stable. He completed another cycle of 5FU bolus/leucovorin/avastin on 11/13/17. He is tolerating treatment moderately well with fatigue and weight loss. I recommend he incorporate boost/ensure into his diet to prevent further weight loss. He has worsening respiratory symptoms, which could be related to recent bronchitis vs lung metastasis.  Recent CT indicates increase in size of bilateral pulmonary nodules as well as new/increasing nodular intralobular septal thickening within the left upper lobe.  I recommend he restart respiratory medications with albuterol and Advair. He can use Mucinex  as needed for cough.  I refilled many of his medications today. -labs reviewed, will proceed with next cycle chemo today; he does not want to schedule further treatments -He agrees to lab and f/u with Dr. Burr Medico in 2-3 weeks when he is here between [facility] and [place] while he moves his family to [place].   2.  Cardiac arrest on 03/20/2017 secondary to oxaliplatin -On metoprolol and Cardizem; I recommend he establish with a new cardiologist once he moves to [place]  3.  Fatigue following weekly 5-FU chemo -Fatigue is stable,  he continues to work full-time -He requested a treatment break to allow his fatigue to improve so he can move his family to [place] over the next few weeks  4.  Obesity -He continues to lose weight steadily on chemotherapy.  He has previously declined Marinol and mirtazapine.  I recommend he restart supplementing his diet with boost/Ensure to maintain his weight and prevent further weight loss.  He agrees  5.  Anxiety -Followed by Marlinda Mike and [place] for ongoing emotional support -He is regularly encouraged to discuss his condition with his wife and family  79. Left Leg neuropathy -No recent complaints  7.  Goals of care discussion -The patient was seen with Dr. Burr Medico who discussed his worsening respiratory function may be related to cancer progression, especially in the setting of increasing CEA.  MD reviewed that the current regimen may not be adequate to control his disease.  Patient understands -Mr. Vultaggio asked for a treatment break to move his family to [place].  I reviewed with the patient that without treatment his cancer will likely progress, he understands  8.  Elevated BP, likely secondary to Avastin -BP fluctuates, normal today 117/83  9. Acute bronchitis  -He completed antibiotic course of Levaquin, cough and wheezing persist.  He will restart albuterol and Advair, I refilled both today.  He can take Mucinex OTC.  I refilled cough medicine   PLAN: -Labs  reviewed, proceed with next cycle 5FU/leucovorin/avastin today -Lab, flush, f/u with Dr. Burr Medico in 2 weeks  -Referral to [facility] in [place] for nutrition supplement  -Restart albuterol; refilled advair, norco, hydrocodone-homatropine -Mucinex OTC for cough    Orders Placed This Encounter  Procedures  . Ambulatory referral to Oncology    Referral Priority:   Routine    Referral Type:   Consultation    Referral Reason:   Specialty Services Required    Requested Specialty:   Oncology    Number of Visits Requested:   1   All questions were answered. The patient knows to call the clinic with any problems, questions or concerns. No barriers to learning was detected.     Alla Feeling, NP 11/27/17   Addendum  Ed has persistent, worsening dry cough, and some dyspnea on exertion. I am concerned that he has cancer progression in his lungs.  Labs reviewed, adequate for treatment, will proceed Avastin and 5-FU bolus today.  He is scheduled to move his family to [place] next weekend, and does not want to have chemotherapy around his relocation.  He is likely going to stay in [place] for little longer by himself.  He knows to call me if he has worsening symptoms, and return to see [place].  We will schedule a follow-up appointment in 2 weeks.  Will make referral for him to see local oncologist in [place].  Truitt Merle  11/27/2017

## 2017-11-27 NOTE — Telephone Encounter (Signed)
Scheduled appt per 5/23 los - pt aware - no print out wanted - my chart active.

## 2017-12-10 ENCOUNTER — Telehealth: Payer: Self-pay | Admitting: Hematology

## 2017-12-10 NOTE — Telephone Encounter (Signed)
FAXED RECORDS TO [facility]. THE NEW PT SCHEDULER WILL CALL PT WITH APPT.

## 2017-12-18 ENCOUNTER — Inpatient Hospital Stay: Payer: BLUE CROSS/BLUE SHIELD

## 2017-12-18 ENCOUNTER — Inpatient Hospital Stay: Payer: BLUE CROSS/BLUE SHIELD | Attending: Hematology | Admitting: Hematology

## 2018-01-26 ENCOUNTER — Telehealth: Payer: Self-pay | Admitting: Hematology

## 2018-01-26 NOTE — Telephone Encounter (Signed)
Faxed medical records to [facility] MD [place] on 01/26/18, Release ID: [id number]

## 2018-03-04 ENCOUNTER — Telehealth: Payer: Self-pay | Admitting: *Deleted

## 2018-03-04 NOTE — Telephone Encounter (Signed)
Pt records mailed to new address in [place]; release [id number]

## 2018-04-07 DEATH — deceased

## 2019-01-14 IMAGING — CT CT ANGIO CHEST
2 of 6 series · 17 of 46 positions shown · IV contrast (ISOVUE)
Comparison: CT scan of June 11, 2017.

CLINICAL DATA: Shortness of breath.

EXAM:
CT ANGIOGRAPHY CHEST WITH CONTRAST
TECHNIQUE: Multidetector CT imaging of the chest was performed using the
standard protocol during bolus administration of intravenous
contrast. Multiplanar CT image reconstructions and MIPs were
obtained to evaluate the vascular anatomy.
CONTRAST:  100mL JBOF0K-ALR IOPAMIDOL (JBOF0K-ALR) INJECTION 76%

[Series 5: thins · axial · 0.69mm/px · z∈[+1541,+1782]mm · 14 of 265 slices shown]
[im 12/265  lung]
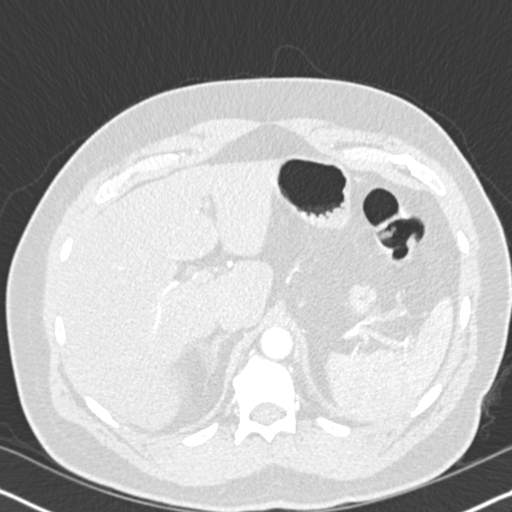
[im 35/265  soft-tissue]
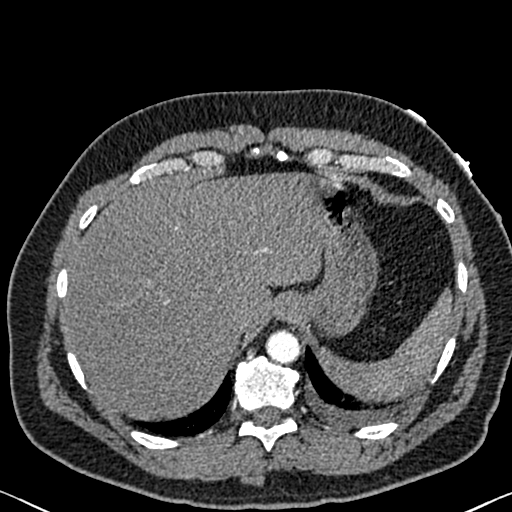
[im 46/265  lung]
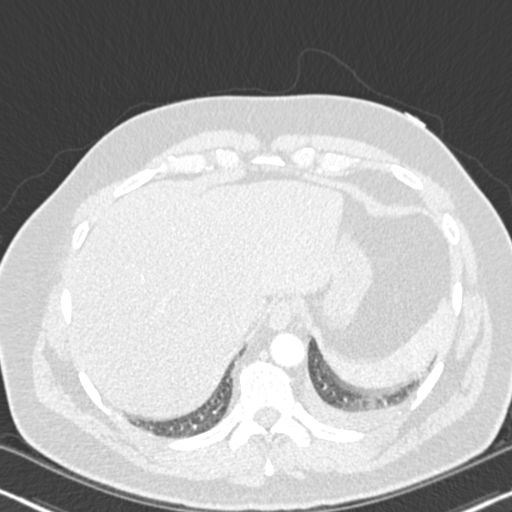
[im 69/265  soft-tissue]
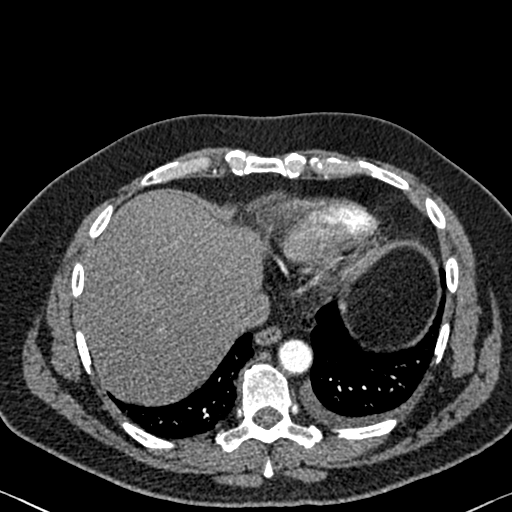
[im 92/265  lung]
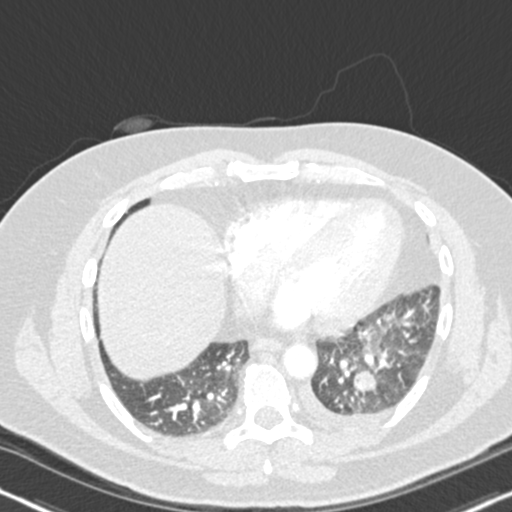
[im 104/265  soft-tissue]
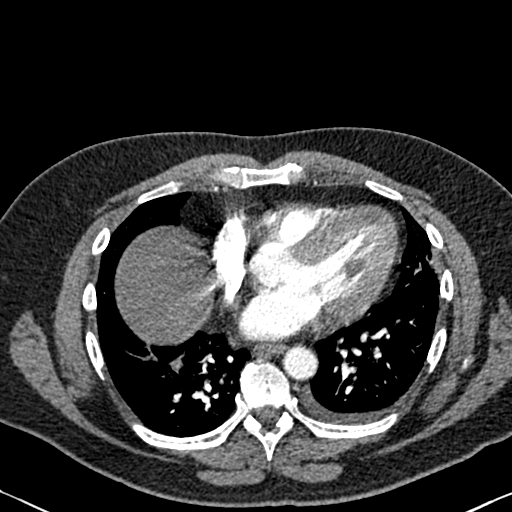
[im 127/265  lung]
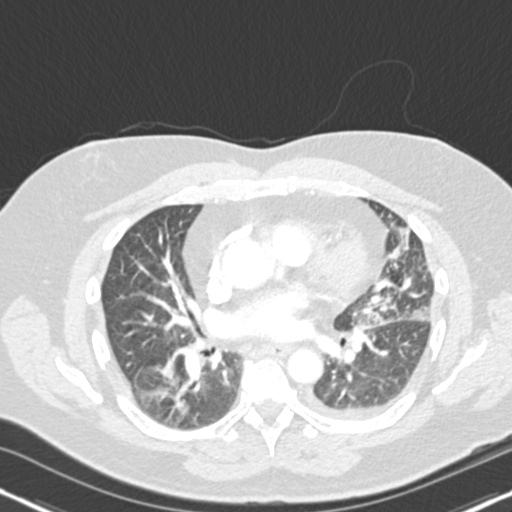
[im 138/265  soft-tissue]
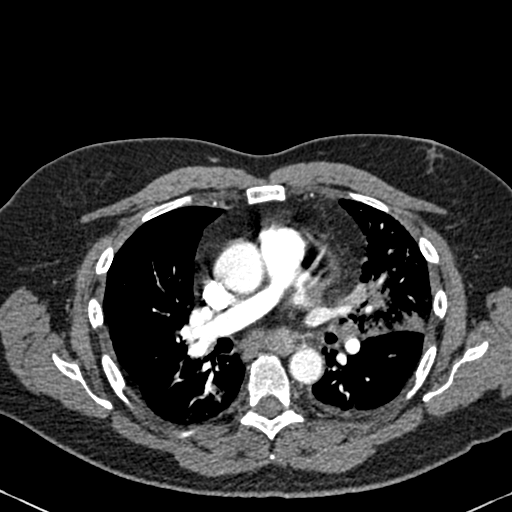
[im 161/265  lung]
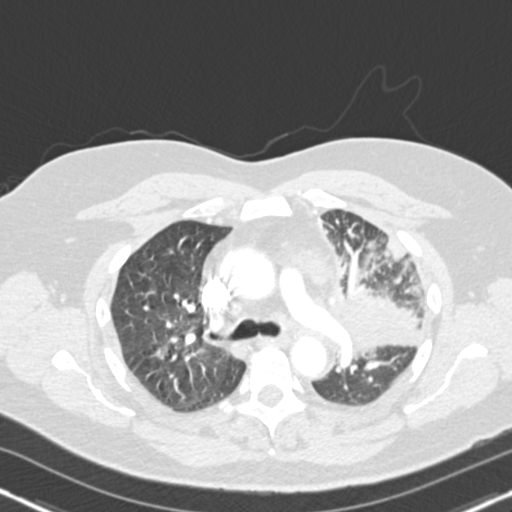
[im 173/265  soft-tissue]
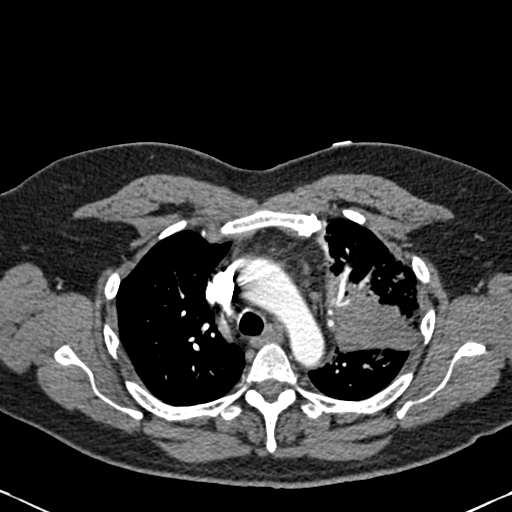
[im 196/265  lung]
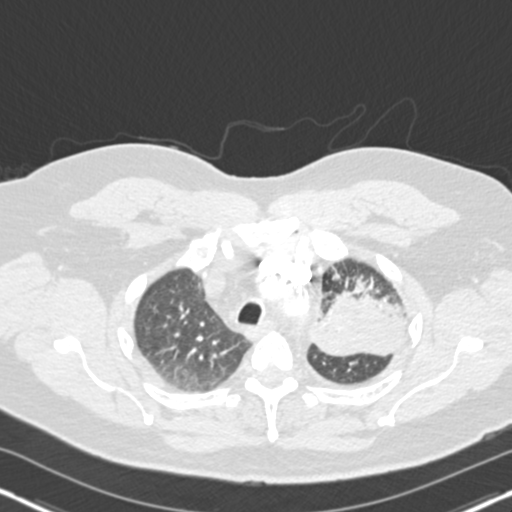
[im 219/265  soft-tissue]
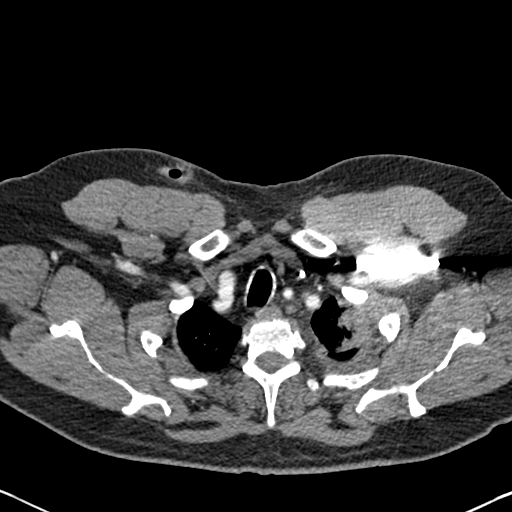
[im 230/265  lung]
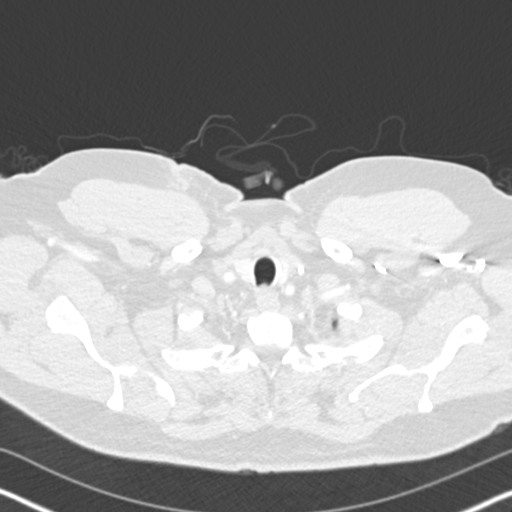
[im 253/265  soft-tissue]
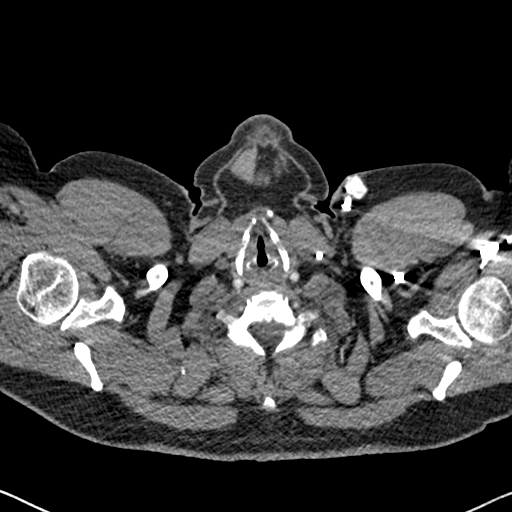

[Series 6: coronal mpr · coronal · 0.52mm/px · 3 of 152 slices shown]
[im 38/152  soft-tissue]
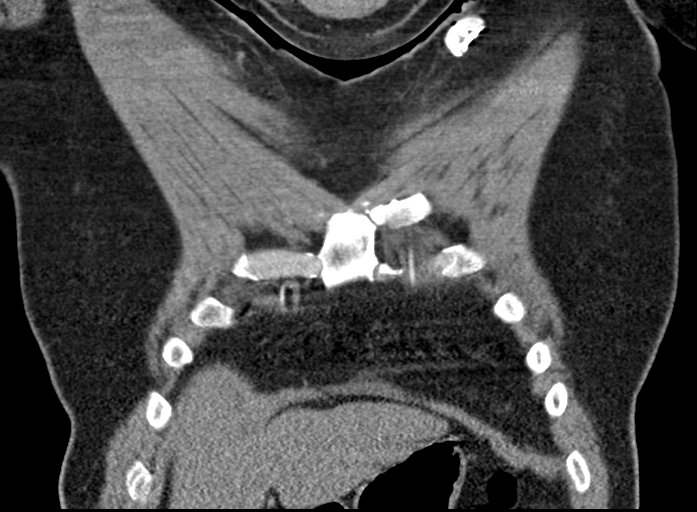
[im 76/152  soft-tissue]
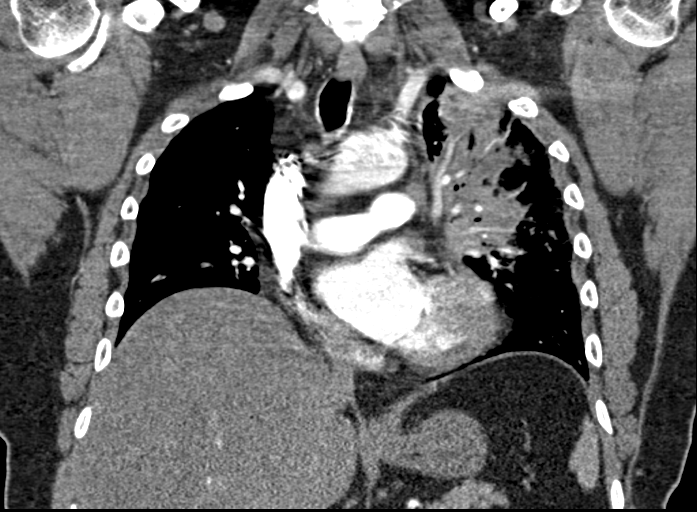
[im 114/152  soft-tissue]
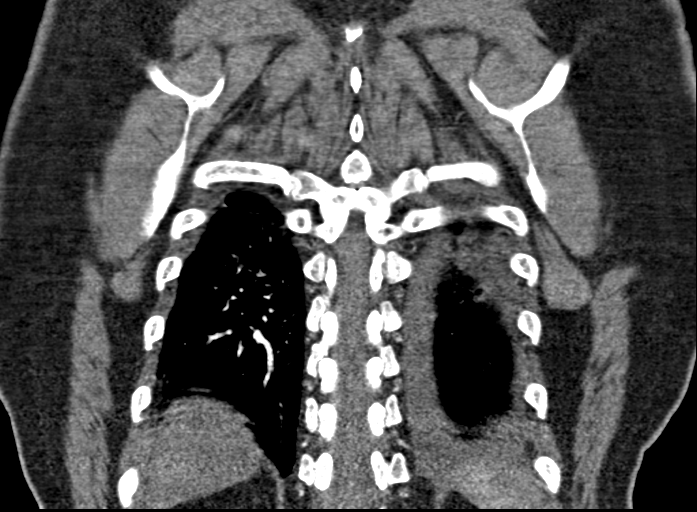

[17 of 46 positions shown; findings below may reference images not displayed]

FINDINGS: Cardiovascular: There is no definite evidence of pulmonary embolus.
No pericardial effusion is noted. There is no evidence of thoracic
aortic dissection or aneurysm.

Mediastinum/Nodes: Thyroid gland and esophagus are unremarkable.
Stable 14 mm adenopathy is noted in aortopulmonary window. 2.1 cm
subcarinal adenopathy is noted which is increased compared to prior
exam. Left hilar mass or adenopathy is also noted.

Lungs/Pleura: No pneumothorax is noted. No significant pleural
effusion is noted. Increased left upper lobe opacity is noted
concerning for worsening pneumonia or postobstructive atelectasis
secondary to malignancy. 2.1 cm right lower lobe nodule is noted
which is increased in size compared to prior exam. Stable 2.7 cm
right infrahilar mass is noted. Stable 2 cm left lower lobe nodule
or metastatic lesion is noted.

Upper Abdomen: The right hepatic metastatic lesion noted on prior
exam is not well visualized on this study.

Musculoskeletal: No chest wall abnormality. No acute or significant
osseous findings.

Review of the MIP images confirms the above findings.
IMPRESSION: No definite evidence of pulmonary embolus.

Stable aortopulmonary window adenopathy is noted. Significantly
increased subcarinal adenopathy is noted concerning for worsening
metastatic disease.

Continued presence of left hilar mass or adenopathy is noted, which
appears to be resulting in increased postobstructive atelectasis or
pneumonia.

Stable pulmonary nodules or metastatic lesions are noted in both
lower lobes.

Right hepatic metastatic lesion noted on prior exam is not well
visualized currently.
